# Patient Record
Sex: Female | Born: 1945 | Race: Black or African American | Hispanic: Refuse to answer | Smoking: Never smoker
Health system: Southern US, Community
[De-identification: ages and names within clinical notes are randomized; demographics above are authoritative.]

## PROBLEM LIST (undated history)

## (undated) DIAGNOSIS — E785 Hyperlipidemia, unspecified: Secondary | ICD-10-CM

## (undated) DIAGNOSIS — IMO0001 Reserved for inherently not codable concepts without codable children: Secondary | ICD-10-CM

## (undated) DIAGNOSIS — N189 Chronic kidney disease, unspecified: Secondary | ICD-10-CM

## (undated) DIAGNOSIS — E119 Type 2 diabetes mellitus without complications: Secondary | ICD-10-CM

## (undated) DIAGNOSIS — K219 Gastro-esophageal reflux disease without esophagitis: Secondary | ICD-10-CM

## (undated) DIAGNOSIS — I10 Essential (primary) hypertension: Secondary | ICD-10-CM

## (undated) DIAGNOSIS — M797 Fibromyalgia: Secondary | ICD-10-CM

## (undated) DIAGNOSIS — M1990 Unspecified osteoarthritis, unspecified site: Secondary | ICD-10-CM

## (undated) HISTORY — PX: EYE SURGERY: SHX253

## (undated) HISTORY — DX: Unspecified osteoarthritis, unspecified site: M19.90

## (undated) HISTORY — DX: Hyperlipidemia, unspecified: E78.5

## (undated) HISTORY — DX: Type 2 diabetes mellitus without complications: E11.9

## (undated) HISTORY — DX: Chronic kidney disease, unspecified: N18.9

## (undated) HISTORY — PX: REFRACTIVE SURGERY: SHX103

## (undated) HISTORY — DX: Essential (primary) hypertension: I10

## (undated) HISTORY — PX: COLONOSCOPY: SHX174

---

## 1995-01-15 HISTORY — PX: ABDOMINAL HYSTERECTOMY: SHX81

## 1998-06-25 ENCOUNTER — Emergency Department (HOSPITAL_COMMUNITY): Admission: EM | Admit: 1998-06-25 | Discharge: 1998-06-25 | Payer: Self-pay | Admitting: Emergency Medicine

## 1999-08-26 ENCOUNTER — Emergency Department (HOSPITAL_COMMUNITY): Admission: EM | Admit: 1999-08-26 | Discharge: 1999-08-26 | Payer: Self-pay | Admitting: Emergency Medicine

## 1999-08-26 ENCOUNTER — Encounter: Payer: Self-pay | Admitting: Emergency Medicine

## 1999-11-07 ENCOUNTER — Encounter: Payer: Self-pay | Admitting: Family Medicine

## 1999-11-07 ENCOUNTER — Encounter: Admission: RE | Admit: 1999-11-07 | Discharge: 1999-11-07 | Payer: Self-pay | Admitting: Family Medicine

## 2001-03-16 ENCOUNTER — Encounter: Admission: RE | Admit: 2001-03-16 | Discharge: 2001-03-16 | Payer: Self-pay | Admitting: Family Medicine

## 2001-03-16 ENCOUNTER — Encounter: Payer: Self-pay | Admitting: Family Medicine

## 2003-10-18 ENCOUNTER — Ambulatory Visit: Payer: Self-pay | Admitting: Internal Medicine

## 2006-04-01 ENCOUNTER — Ambulatory Visit: Payer: Self-pay | Admitting: Internal Medicine

## 2007-04-07 ENCOUNTER — Ambulatory Visit: Payer: Self-pay | Admitting: Internal Medicine

## 2008-01-15 HISTORY — PX: CHOLECYSTECTOMY: SHX55

## 2008-07-19 ENCOUNTER — Ambulatory Visit: Payer: Self-pay | Admitting: Internal Medicine

## 2009-02-20 ENCOUNTER — Ambulatory Visit: Payer: Self-pay | Admitting: Gastroenterology

## 2009-11-21 ENCOUNTER — Ambulatory Visit: Payer: Self-pay | Admitting: Internal Medicine

## 2010-02-07 ENCOUNTER — Ambulatory Visit: Payer: Self-pay

## 2010-09-10 ENCOUNTER — Emergency Department (HOSPITAL_COMMUNITY)
Admission: EM | Admit: 2010-09-10 | Discharge: 2010-09-10 | Disposition: A | Discharge status: Home / Self Care | Payer: Medicare Other | Attending: Emergency Medicine | Admitting: Emergency Medicine

## 2010-09-10 ENCOUNTER — Emergency Department (HOSPITAL_COMMUNITY): Payer: Medicare Other

## 2010-09-10 DIAGNOSIS — Z794 Long term (current) use of insulin: Secondary | ICD-10-CM | POA: Insufficient documentation

## 2010-09-10 DIAGNOSIS — I10 Essential (primary) hypertension: Secondary | ICD-10-CM | POA: Insufficient documentation

## 2010-09-10 DIAGNOSIS — M25519 Pain in unspecified shoulder: Secondary | ICD-10-CM | POA: Insufficient documentation

## 2010-09-10 DIAGNOSIS — E119 Type 2 diabetes mellitus without complications: Secondary | ICD-10-CM | POA: Insufficient documentation

## 2010-12-03 ENCOUNTER — Ambulatory Visit: Payer: Self-pay

## 2011-12-05 ENCOUNTER — Encounter: Payer: Self-pay | Admitting: Rheumatology

## 2011-12-15 ENCOUNTER — Encounter: Payer: Self-pay | Admitting: Rheumatology

## 2012-01-15 HISTORY — PX: CATARACT EXTRACTION: SUR2

## 2013-01-19 ENCOUNTER — Ambulatory Visit: Payer: Self-pay

## 2013-06-19 LAB — HM MAMMOGRAPHY: HM Mammogram: NORMAL

## 2013-07-30 ENCOUNTER — Ambulatory Visit: Payer: Medicare Other | Admitting: Internal Medicine

## 2013-08-02 ENCOUNTER — Ambulatory Visit (INDEPENDENT_AMBULATORY_CARE_PROVIDER_SITE_OTHER): Payer: Medicare Other | Admitting: Internal Medicine

## 2013-08-02 ENCOUNTER — Encounter: Payer: Self-pay | Admitting: Internal Medicine

## 2013-08-02 ENCOUNTER — Ambulatory Visit (INDEPENDENT_AMBULATORY_CARE_PROVIDER_SITE_OTHER)
Admission: RE | Admit: 2013-08-02 | Discharge: 2013-08-02 | Disposition: A | Discharge status: Home / Self Care | Payer: Medicare Other | Source: Ambulatory Visit | Attending: Internal Medicine | Admitting: Internal Medicine

## 2013-08-02 VITALS — BP 124/72 | HR 70 | Temp 98.3°F | Ht 65.75 in | Wt 262.0 lb

## 2013-08-02 DIAGNOSIS — Z8669 Personal history of other diseases of the nervous system and sense organs: Secondary | ICD-10-CM

## 2013-08-02 DIAGNOSIS — E1165 Type 2 diabetes mellitus with hyperglycemia: Secondary | ICD-10-CM | POA: Insufficient documentation

## 2013-08-02 DIAGNOSIS — M5440 Lumbago with sciatica, unspecified side: Secondary | ICD-10-CM

## 2013-08-02 DIAGNOSIS — M5430 Sciatica, unspecified side: Secondary | ICD-10-CM

## 2013-08-02 DIAGNOSIS — M129 Arthropathy, unspecified: Secondary | ICD-10-CM

## 2013-08-02 DIAGNOSIS — I10 Essential (primary) hypertension: Secondary | ICD-10-CM

## 2013-08-02 DIAGNOSIS — M479 Spondylosis, unspecified: Secondary | ICD-10-CM | POA: Insufficient documentation

## 2013-08-02 DIAGNOSIS — M1990 Unspecified osteoarthritis, unspecified site: Secondary | ICD-10-CM

## 2013-08-02 DIAGNOSIS — E118 Type 2 diabetes mellitus with unspecified complications: Secondary | ICD-10-CM

## 2013-08-02 DIAGNOSIS — E119 Type 2 diabetes mellitus without complications: Secondary | ICD-10-CM

## 2013-08-02 DIAGNOSIS — E785 Hyperlipidemia, unspecified: Secondary | ICD-10-CM

## 2013-08-02 LAB — COMPREHENSIVE METABOLIC PANEL
ALT: 16 U/L (ref 0–35)
AST: 21 U/L (ref 0–37)
Albumin: 3.5 g/dL (ref 3.5–5.2)
Alkaline Phosphatase: 123 U/L — ABNORMAL HIGH (ref 39–117)
BUN: 12 mg/dL (ref 6–23)
CO2: 28 mEq/L (ref 19–32)
Calcium: 9 mg/dL (ref 8.4–10.5)
Chloride: 106 mEq/L (ref 96–112)
Creatinine, Ser: 1.1 mg/dL (ref 0.4–1.2)
GFR: 65.12 mL/min (ref >60.00)
Glucose, Bld: 96 mg/dL (ref 70–99)
Potassium: 3.9 mEq/L (ref 3.5–5.1)
Sodium: 141 mEq/L (ref 135–145)
Total Bilirubin: 0.4 mg/dL (ref 0.2–1.2)
Total Protein: 7.3 g/dL (ref 6.0–8.3)

## 2013-08-02 LAB — LIPID PANEL
Cholesterol: 142 mg/dL (ref 0–200)
HDL: 43.3 mg/dL (ref >39.00)
LDL Cholesterol: 72 mg/dL (ref 0–99)
NonHDL: 98.7
Total CHOL/HDL Ratio: 3
Triglycerides: 136 mg/dL (ref 0.0–149.0)
VLDL: 27.2 mg/dL (ref 0.0–40.0)

## 2013-08-02 LAB — CBC
HCT: 35.2 % — ABNORMAL LOW (ref 36.0–46.0)
Hemoglobin: 11.4 g/dL — ABNORMAL LOW (ref 12.0–15.0)
MCHC: 32.4 g/dL (ref 30.0–36.0)
MCV: 81.1 fl (ref 78.0–100.0)
Platelets: 289 10*3/uL (ref 150.0–400.0)
RBC: 4.34 Mil/uL (ref 3.87–5.11)
RDW: 15.5 % (ref 11.5–15.5)
WBC: 5.6 10*3/uL (ref 4.0–10.5)

## 2013-08-02 LAB — MICROALBUMIN / CREATININE URINE RATIO
Creatinine,U: 88.8 mg/dL
Microalb Creat Ratio: 0.3 mg/g (ref 0.0–30.0)
Microalb, Ur: 0.3 mg/dL (ref 0.0–1.9)

## 2013-08-02 LAB — HEMOGLOBIN A1C: Hgb A1c MFr Bld: 10.4 % — ABNORMAL HIGH (ref 4.6–6.5)

## 2013-08-02 MED ORDER — LOSARTAN POTASSIUM 25 MG PO TABS: 25.0000 mg | ORAL_TABLET | Freq: Every day | ORAL | Status: DC

## 2013-08-02 NOTE — Assessment & Plan Note (Signed)
Mostly hands Controlled with etodolac

## 2013-08-02 NOTE — Assessment & Plan Note (Signed)
No issues on Lipitor Will check CMET and lipid profile today

## 2013-08-02 NOTE — Progress Notes (Signed)
Pre visit review using our clinic review tool, if applicable. No additional management support is needed unless otherwise documented below in the visit note. 

## 2013-08-02 NOTE — Assessment & Plan Note (Signed)
She is on lasix for peripheral edema She would like to be take off the lisinopril-hct because she is experiencing hair loss She has had 1 hypotensive episode Will d/c lisinopril-hct  RX provided for losartan 25 mg

## 2013-08-02 NOTE — Progress Notes (Signed)
HPI  Pt presents to the clinic today to establish care. [facility] is [facility].   HTN: Well controlled on Lasix, Lisinopril-HCT. She does report having 1 hypotensive episode in the last month. She does not recall taking any extra medication. She also feels like her hair is starting to fall out since starting on the Lisinopril-HCT.  DM2: Blood sugars range 97-220. She is currently on Amaryl. She reports that she takes the Lantus when she feels the numbness and tingling in her feet. She stopped the Victoza because she felt it was causing her sciatica to flare up.  HLD: Denies myalgias on Lipitor.  Arthritis: Takes Etodolac when needed.  Flu: never Tetanus: < than 10 years ago Pneumovax: never Zostovax: never Mammogram: 2014 Pap Smear: Hysterectomy 1997 Bone Density: 2010 Colon Screening: 2009 Eye Doctor: scheduled 08/2013 Dentist: biannually  Past Medical History  Diagnosis Date  . Arthritis   . Diabetes mellitus without complication   . Hyperlipidemia   . Hypertension     Current Outpatient Prescriptions  Medication Sig Dispense Refill  . aspirin 81 MG tablet Take 81 mg by mouth daily.      Marland Kitchen. atorvastatin (LIPITOR) 20 MG tablet Take 20 mg by mouth daily.      Marland Kitchen. etodolac (LODINE) 400 MG tablet Take 400 mg by mouth 3 (three) times daily.      . furosemide (LASIX) 20 MG tablet Take 20 mg by mouth daily as needed.      Marland Kitchen. glimepiride (AMARYL) 4 MG tablet Take 4 mg by mouth daily with breakfast.      . Insulin Glargine (LANTUS) 100 UNIT/ML Solostar Pen Inject 20 Units into the skin daily at 10 pm.      . Liraglutide (VICTOZA) 18 MG/3ML SOPN Inject 1.2 mLs into the skin.      Marland Kitchen. lisinopril-hydrochlorothiazide (PRINZIDE,ZESTORETIC) 10-12.5 MG per tablet Take 1 tablet by mouth daily.       No current facility-administered medications for this visit.    No Known Allergies  Family History  Problem Relation Age of Onset  . Arthritis Mother   . Cancer Mother    Ovarian  . Diabetes Mother   . Heart disease Father   . Hypertension Father     History   Social History  . Marital Status: Widowed    Spouse Name: N/A    Number of Children: N/A  . Years of Education: N/A   Occupational History  . Not on file.   Social History Main Topics  . Smoking status: Never Smoker   . Smokeless tobacco: Never Used  . Alcohol Use: No  . Drug Use: Not on file  . Sexual Activity: Not on file   Other Topics Concern  . Not on file   Social History Narrative  . No narrative on file    ROS:  Constitutional: Denies fever, malaise, fatigue, headache or abrupt weight changes.  Respiratory: Denies difficulty breathing, shortness of breath, cough or sputum production.   Cardiovascular: Denies chest pain, chest tightness, palpitations or swelling in the hands or feet.  Musculoskeletal: Pt reports joint pain in hands and low back pain. Denies decrease in range of motion, difficulty with gait.  Skin: Denies redness, rashes, lesions or ulcercations.  Neurological: Denies dizziness, difficulty with memory, difficulty with speech or problems with balance and coordination.   No other specific complaints in a complete review of systems (except as listed in HPI above).  PE:  BP 124/72  Pulse 70  Temp(Src) 98.3 F (36.8 C) (Oral)  Ht 5' 5.75" (1.67 m)  Wt 262 lb (118.842 kg)  BMI 42.61 kg/m2  SpO2 98% Wt Readings from Last 3 Encounters:  08/02/13 262 lb (118.842 kg)    General: Appears her stated age, obese but well developed, well nourished in NAD. Cardiovascular: Normal rate and rhythm. S1,S2 noted.  No murmur, rubs or gallops noted. No JVD or BLE edema. No carotid bruits noted. Pulmonary/Chest: Normal effort and positive vesicular breath sounds. No respiratory distress. No wheezes, rales or ronchi noted.  Musculoskeletal: Normal flexion and extension of the fingers. Decreased flexion, normal extension of the back.  Neurological: Alert and oriented.  Cranial nerves II-XII intact. Coordination normal. +DTRs bilaterally.   Assessment and Plan:  Low back pain with history of sciatica:  She has never had this worked up She would like a referral to a specialist Will start with xray of lumbar spine Will likely need MRI of lumbar spine Advised her that I do not think the Victoza would be causing her sciatica to flare Continue etodolac for now  RTC in 6 months to follow up chronic medical conditions

## 2013-08-02 NOTE — Patient Instructions (Addendum)

## 2013-08-02 NOTE — Assessment & Plan Note (Signed)
Encouraged her to work on diet and exercise 

## 2013-08-02 NOTE — Assessment & Plan Note (Signed)
She has stopped the Victoza Only taking Lantus when she feels she needs to Taking Amaryl daily Hx of renal insufficiency with Metformin Will check A1C and microalbumin today

## 2013-08-12 ENCOUNTER — Ambulatory Visit: Payer: Medicare Other | Admitting: Internal Medicine

## 2013-08-31 ENCOUNTER — Encounter: Payer: Self-pay | Admitting: Internal Medicine

## 2013-10-13 ENCOUNTER — Other Ambulatory Visit: Payer: Self-pay

## 2013-10-13 DIAGNOSIS — I10 Essential (primary) hypertension: Secondary | ICD-10-CM

## 2013-10-13 MED ORDER — LOSARTAN POTASSIUM 25 MG PO TABS: 25.0000 mg | ORAL_TABLET | Freq: Every day | ORAL | Status: DC

## 2013-10-13 MED ORDER — ATORVASTATIN CALCIUM 20 MG PO TABS: 20.0000 mg | ORAL_TABLET | Freq: Every day | ORAL | Status: DC

## 2013-10-13 NOTE — Telephone Encounter (Signed)
[facility] left /vm requesting 90 day refill on atorvastatin and losartan instead of 30 day supply. Pt has appt scheduled on 11/08/13. Refills done.

## 2013-11-02 ENCOUNTER — Ambulatory Visit: Payer: Medicare Other | Admitting: Internal Medicine

## 2013-11-03 ENCOUNTER — Other Ambulatory Visit: Payer: Self-pay

## 2013-11-03 NOTE — Telephone Encounter (Signed)
This medication has never been filled by you--please advise if okay to fill

## 2013-11-08 ENCOUNTER — Encounter: Payer: Self-pay | Admitting: Internal Medicine

## 2013-11-08 ENCOUNTER — Ambulatory Visit (INDEPENDENT_AMBULATORY_CARE_PROVIDER_SITE_OTHER): Payer: Medicare Other | Admitting: Internal Medicine

## 2013-11-08 VITALS — BP 146/94 | HR 81 | Temp 98.6°F | Wt 275.0 lb

## 2013-11-08 DIAGNOSIS — E1165 Type 2 diabetes mellitus with hyperglycemia: Secondary | ICD-10-CM

## 2013-11-08 DIAGNOSIS — I10 Essential (primary) hypertension: Secondary | ICD-10-CM

## 2013-11-08 DIAGNOSIS — E1169 Type 2 diabetes mellitus with other specified complication: Secondary | ICD-10-CM

## 2013-11-08 DIAGNOSIS — E785 Hyperlipidemia, unspecified: Secondary | ICD-10-CM

## 2013-11-08 DIAGNOSIS — M1990 Unspecified osteoarthritis, unspecified site: Secondary | ICD-10-CM

## 2013-11-08 LAB — HEMOGLOBIN A1C: Hgb A1c MFr Bld: 9.4 % — ABNORMAL HIGH (ref 4.6–6.5)

## 2013-11-08 MED ORDER — TRAMADOL HCL 50 MG PO TABS: 50.0000 mg | ORAL_TABLET | Freq: Two times a day (BID) | ORAL | Status: DC | PRN

## 2013-11-08 NOTE — Assessment & Plan Note (Signed)
Hyperglycemia Last A1C 10.4 Also noted some PVD changes History of kidney disease with Metformin-now off Will repeat A1C today Continue Amaryl and Lantus at this time- will dose adjust if needed She is scheduled for eye exam She will get flu shot at pharmacy Will call pharmacy to see if/when she received pneumonia vaccine Diet information given

## 2013-11-08 NOTE — Patient Instructions (Addendum)
Diabetes Mellitus and Food It is important for you to manage your blood sugar (glucose) level. Your blood glucose level can be greatly affected by what you eat. Eating healthier foods in the appropriate amounts throughout the day at about the same time each day will help you control your blood glucose level. It can also help slow or prevent worsening of your diabetes mellitus. Healthy eating may even help you improve the level of your blood pressure and reach or maintain a healthy weight.  HOW CAN FOOD AFFECT ME? Carbohydrates Carbohydrates affect your blood glucose level more than any other type of food. Your dietitian will help you determine how many carbohydrates to eat at each meal and teach you how to count carbohydrates. Counting carbohydrates is important to keep your blood glucose at a healthy level, especially if you are using insulin or taking certain medicines for diabetes mellitus. Alcohol Alcohol can cause sudden decreases in blood glucose (hypoglycemia), especially if you use insulin or take certain medicines for diabetes mellitus. Hypoglycemia can be a life-threatening condition. Symptoms of hypoglycemia (sleepiness, dizziness, and disorientation) are similar to symptoms of having too much alcohol.  If your health care provider has given you approval to drink alcohol, do so in moderation and use the following guidelines:  Women should not have more than one drink per day, and men should not have more than two drinks per day. One drink is equal to:  12 oz of beer.  5 oz of wine.  1 oz of hard liquor.  Do not drink on an empty stomach.  Keep yourself hydrated. Have water, diet soda, or unsweetened iced tea.  Regular soda, juice, and other mixers might contain a lot of carbohydrates and should be counted. WHAT FOODS ARE NOT RECOMMENDED? As you make food choices, it is important to remember that all foods are not the same. Some foods have fewer nutrients per serving than other  foods, even though they might have the same number of calories or carbohydrates. It is difficult to get your body what it needs when you eat foods with fewer nutrients. Examples of foods that you should avoid that are high in calories and carbohydrates but low in nutrients include:  Trans fats (most processed foods list trans fats on the Nutrition Facts label).  Regular soda.  Juice.  Candy.  Sweets, such as cake, pie, doughnuts, and cookies.  Fried foods. WHAT FOODS CAN I EAT? Have nutrient-rich foods, which will nourish your body and keep you healthy. The food you should eat also will depend on several factors, including:  The calories you need.  The medicines you take.  Your weight.  Your blood glucose level.  Your blood pressure level.  Your cholesterol level. You also should eat a variety of foods, including:  Protein, such as meat, poultry, fish, tofu, nuts, and seeds (lean animal proteins are best).  Fruits.  Vegetables.  Dairy products, such as milk, cheese, and yogurt (low fat is best).  Breads, grains, pasta, cereal, rice, and beans.  Fats such as olive oil, trans fat-free margarine, canola oil, avocado, and olives. DOES EVERYONE WITH DIABETES MELLITUS HAVE THE SAME MEAL PLAN? Because every person with diabetes mellitus is different, there is not one meal plan that works for everyone. It is very important that you meet with a dietitian who will help you create a meal plan that is just right for you. Document Released: 09/27/2004 Document Revised: 01/05/2013 Document Reviewed: 11/27/2012 Exit[employer] Patient Information 2015 [employer]. This   information is not intended to replace advice given to you by your health care provider. Make sure you discuss any questions you have with your health care provider.  

## 2013-11-08 NOTE — Progress Notes (Signed)
Subjective:    Patient ID: Bethany [name], female    DOB: Sep 20, 1945, 68 y.o.   MRN: [id number]  HPI  Pt presents to the clinic today for 3 month follow up.  DM2 with kidney dysfunction and hyperglycemia: Blood sugars range from 40's to 280 as the highest. Still taking the amyrl and lantus daily. Had to be taken off Metformin due to kidney dysfunction. She has gained 13 lbs over the last 3 months. She has not been able to exercise because of worsening back pain. Gets flu shot yearly, waiting for her pharmacy to get the high dose. Unsure of last pneumovax shot but reports if she did get one, it was at her pharmacy. Past due for vision exam, she has one scheduled 11/2013.  HLD: Likely R/T DM2. On Lipitor 20 mg daily. Denies myalgias. Last lipid profile reviewed. LDL 72.  HTN: Likely R/T obesity. Borderline on Cozaar and Lasix. She reports that she has not taken her medications this morning.  Arthritis: Mostly in her back. Causing flares of sciatica at times. Worse recently. Etodolac not helping. Was trying to do water aerobics for exercise but unable to due to back pain. Seems to be worse with standing.  Review of Systems      Past Medical History  Diagnosis Date  . Arthritis   . Diabetes mellitus without complication   . Hyperlipidemia   . Hypertension     Current Outpatient Prescriptions  Medication Sig Dispense Refill  . aspirin 81 MG tablet Take 81 mg by mouth daily.      Marland Kitchen. atorvastatin (LIPITOR) 20 MG tablet Take 1 tablet (20 mg total) by mouth daily.  90 tablet  0  . furosemide (LASIX) 20 MG tablet Take 20 mg by mouth daily as needed.      Marland Kitchen. glimepiride (AMARYL) 4 MG tablet Take 4 mg by mouth daily with breakfast.      . Insulin Glargine (LANTUS) 100 UNIT/ML Solostar Pen Inject 20 Units into the skin daily at 10 pm.      . losartan (COZAAR) 25 MG tablet Take 1 tablet (25 mg total) by mouth daily.  90 tablet  0  . traMADol (ULTRAM) 50 MG tablet Take 1 tablet (50 mg total) by  mouth every 12 (twelve) hours as needed.  60 tablet  0   No current facility-administered medications for this visit.    No Known Allergies  Family History  Problem Relation Age of Onset  . Arthritis Mother   . Cancer Mother     Ovarian  . Diabetes Mother   . Heart disease Father   . Hypertension Father     History   Social History  . Marital Status: Widowed    Spouse Name: N/A    Number of Children: N/A  . Years of Education: N/A   Occupational History  . Not on file.   Social History Main Topics  . Smoking status: Never Smoker   . Smokeless tobacco: Never Used  . Alcohol Use: No  . Drug Use: No  . Sexual Activity: Not Currently   Other Topics Concern  . Not on file   Social History Narrative  . No narrative on file     Constitutional: Pt reports weight gain. Denies fever, malaise, fatigue, headache.  Respiratory: Denies difficulty breathing, shortness of breath, cough or sputum production.   Cardiovascular: Denies chest pain, chest tightness, palpitations or swelling in the hands or feet.  Musculoskeletal: Pt reports back pain. Denies  muscle pain or joint pain and swelling.  Skin: Denies redness, rashes, lesions or ulcercations.  Neurological: Denies dizziness, difficulty with memory, difficulty with speech or problems with balance and coordination.   No other specific complaints in a complete review of systems (except as listed in HPI above).  Objective:   Physical Exam   BP 146/94  Pulse 81  Temp(Src) 98.6 F (37 C) (Oral)  Wt 275 lb (124.739 kg)  SpO2 98% Wt Readings from Last 3 Encounters:  11/08/13 275 lb (124.739 kg)  08/02/13 262 lb (118.842 kg)    General: Appears her stated age, obese but well developed, well nourished in NAD. Skin: Warm, dry and intact. No rashes, lesions or ulcerations noted. Cardiovascular: Normal rate and rhythm. S1,S2 noted.  No murmur, rubs or gallops noted. No JVD or BLE edema. No carotid bruits  noted. Pulmonary/Chest: Normal effort and positive vesicular breath sounds. No respiratory distress. No wheezes, rales or ronchi noted.  Musculoskeletal: Decreased flexion, extension and rotation of the lumbar spine. Pain with palpation of the lumbar spine. Strength 5/5 BLE. Ataxic gait.  Neurological: Alert and oriented.   BMET    Component Value Date/Time   NA 141 08/02/2013 0954   K 3.9 08/02/2013 0954   CL 106 08/02/2013 0954   CO2 28 08/02/2013 0954   GLUCOSE 96 08/02/2013 0954   BUN 12 08/02/2013 0954   CREATININE 1.1 08/02/2013 0954   CALCIUM 9.0 08/02/2013 0954    Lipid Panel     Component Value Date/Time   CHOL 142 08/02/2013 0954   TRIG 136.0 08/02/2013 0954   HDL 43.30 08/02/2013 0954   CHOLHDL 3 08/02/2013 0954   VLDL 27.2 08/02/2013 0954   LDLCALC 72 08/02/2013 0954    CBC    Component Value Date/Time   WBC 5.6 08/02/2013 0954   RBC 4.34 08/02/2013 0954   HGB 11.4* 08/02/2013 0954   HCT 35.2* 08/02/2013 0954   PLT 289.0 08/02/2013 0954   MCV 81.1 08/02/2013 0954   MCHC 32.4 08/02/2013 0954   RDW 15.5 08/02/2013 0954    Hgb A1C Lab Results  Component Value Date   HGBA1C 10.4* 08/02/2013        Assessment & Plan:

## 2013-11-08 NOTE — Assessment & Plan Note (Signed)
Has gained 13 pounds She does not adhere to any diet plan Does not exercise due to back pain Discussed how obesity is contributing to chronic conditions- DM2, HTN, and arthritis

## 2013-11-08 NOTE — Assessment & Plan Note (Signed)
Likely R/T diabetes Well controlled on Lipitor

## 2013-11-08 NOTE — Assessment & Plan Note (Signed)
Borderline elevated today but has not taken her medication Will continue Cozaar and Lasix at this time

## 2013-11-08 NOTE — Assessment & Plan Note (Addendum)
Worse, likely due to increased obesity Lumbar films from 09/2013 reviewed Will stop Etodolac and change to Tramadol If no improvement, may need referral to orthopedics for injection Encouraged her to try the water aerobics as weight loss would be beneficial

## 2013-11-09 ENCOUNTER — Telehealth: Payer: Self-pay | Admitting: Internal Medicine

## 2013-11-09 NOTE — Telephone Encounter (Signed)
emmi mailed  °

## 2013-11-10 NOTE — Addendum Note (Signed)
Addended by: Roena MaladyEVONTENNO, Ermalinda Joubert Y on: 11/10/2013 11:31 AM   Modules accepted: Orders

## 2013-12-07 ENCOUNTER — Other Ambulatory Visit: Payer: Self-pay | Admitting: Family Medicine

## 2013-12-07 NOTE — Telephone Encounter (Signed)
Ok to phone in tramadol 

## 2013-12-07 NOTE — Telephone Encounter (Signed)
Pt requesting medication refill. Last f/u appt 10/2013. pls advise 

## 2013-12-07 NOTE — Telephone Encounter (Signed)
Rx called in to pharmacy. 

## 2013-12-08 ENCOUNTER — Other Ambulatory Visit: Payer: Self-pay | Admitting: *Deleted

## 2013-12-08 MED ORDER — GLIMEPIRIDE 4 MG PO TABS: 4.0000 mg | ORAL_TABLET | Freq: Every day | ORAL | Status: DC

## 2014-01-27 ENCOUNTER — Other Ambulatory Visit (INDEPENDENT_AMBULATORY_CARE_PROVIDER_SITE_OTHER): Payer: Medicare Other

## 2014-01-27 DIAGNOSIS — E1169 Type 2 diabetes mellitus with other specified complication: Secondary | ICD-10-CM

## 2014-01-27 DIAGNOSIS — L738 Other specified follicular disorders: Secondary | ICD-10-CM | POA: Diagnosis not present

## 2014-01-27 LAB — HEMOGLOBIN A1C: Hgb A1c MFr Bld: 9.4 % — ABNORMAL HIGH (ref 4.6–6.5)

## 2014-01-28 ENCOUNTER — Other Ambulatory Visit: Payer: Self-pay

## 2014-01-28 MED ORDER — TRAMADOL HCL 50 MG PO TABS: ORAL_TABLET | ORAL | Status: DC

## 2014-01-28 NOTE — Telephone Encounter (Signed)
Note left requesting refill tramadol to [facility].Please advise.

## 2014-01-28 NOTE — Telephone Encounter (Signed)
OK to phone in tramadol 

## 2014-01-28 NOTE — Telephone Encounter (Signed)
Rx called in to pharmacy. 

## 2014-02-03 ENCOUNTER — Ambulatory Visit (INDEPENDENT_AMBULATORY_CARE_PROVIDER_SITE_OTHER): Payer: Medicare Other | Admitting: Internal Medicine

## 2014-02-03 ENCOUNTER — Encounter: Payer: Self-pay | Admitting: Internal Medicine

## 2014-02-03 VITALS — BP 134/86 | HR 74 | Temp 98.8°F | Wt 274.0 lb

## 2014-02-03 DIAGNOSIS — M545 Low back pain, unspecified: Secondary | ICD-10-CM

## 2014-02-03 DIAGNOSIS — Z23 Encounter for immunization: Secondary | ICD-10-CM | POA: Diagnosis not present

## 2014-02-03 DIAGNOSIS — E1165 Type 2 diabetes mellitus with hyperglycemia: Secondary | ICD-10-CM

## 2014-02-03 DIAGNOSIS — E785 Hyperlipidemia, unspecified: Secondary | ICD-10-CM | POA: Diagnosis not present

## 2014-02-03 DIAGNOSIS — I10 Essential (primary) hypertension: Secondary | ICD-10-CM

## 2014-02-03 DIAGNOSIS — E669 Obesity, unspecified: Secondary | ICD-10-CM | POA: Diagnosis not present

## 2014-02-03 NOTE — Patient Instructions (Signed)

## 2014-02-03 NOTE — Progress Notes (Signed)
Pre visit review using our clinic review tool, if applicable. No additional management support is needed unless otherwise documented below in the visit note. 

## 2014-02-03 NOTE — Progress Notes (Signed)
HPI  Bethany [name] is a 69 y.o. female presenting to the clinic today for follow-up of chronic medical conditions DM Type 2, essential hypertension, and hyperlipidemia.   DM2: Her A1c is 9.4 which is no change from 10/15, despite increase of 10 unit increase of Lantus. She reports taking medicine as directed, time and administration. She attributes no improvement to her diet or exercise. She reports chronic low back pain that prevents her from cooking meals or starting an exercise routine. She has worked with diabetic dieticians in the past but struggles to follow any plan. Sometimes she doesn't eat at night due to exhaustion and rarely eats vegetables or balanced meals. She reports highest glucose readings in the evening. Pt. reports sweating and shaking in the mornings due to low blood sugar. Past due for vision exam, did not make it to last scheduled appointment but reports she has made a new one for 2/15. Would like to have the flu shot today. Pneumonia shot not UTD.  HLD: Likely R/T DM2. On atorvastatin 20 mg daily. Denies myalgias. Last lipid profile reviewed -  LDL 72 mg/dL  HTN: Likely R/T obesity. Controlled on Cozaar and Lasix. Stable reading today at 134/86.   Arthritis: Mostly in back. Worse w/ standing. Causing flares of sciatica under thighs. No improvement w/ Etodolac. Request referral to Ortho today.  Review of Systems      Past Medical History  Diagnosis Date  . Arthritis   . Diabetes mellitus without complication   . Hyperlipidemia   . Hypertension     Family History  Problem Relation Age of Onset  . Arthritis Mother   . Cancer Mother     Ovarian  . Diabetes Mother   . Heart disease Father   . Hypertension Father     History   Social History  . Marital Status: Widowed    Spouse Name: N/A    Number of Children: N/A  . Years of Education: N/A   Occupational History  . Not on file.   Social History Main Topics  . Smoking status: Never Smoker   . Smokeless  tobacco: Never Used  . Alcohol Use: No  . Drug Use: No  . Sexual Activity: Not Currently   Other Topics Concern  . Not on file   Social History Narrative    No Known Allergies   Constitutional: Positive fatigue. Denies headache, fever, mental status changes.  Respiratory: Denies difficulty breathing, shortness of breath, or cough.  Cardiovascular: Denies chest pain, chest tightness, palpitations or swelling in the hands or feet.  Neuro: Denies visual disturbances, dizziness, numbness, tingling, loss of sensation, difficulty w/ memory, speech or problems w/ balance and coordination.  No other specific complaints in a complete review of systems (except as listed in HPI above).  Objective:   BP 134/86 mmHg  Pulse 74  Temp(Src) 98.8 F (37.1 C) (Oral)  Wt 274 lb (124.286 kg)  SpO2 99% Wt Readings from Last 3 Encounters:  02/03/14 274 lb (124.286 kg)  11/08/13 275 lb (124.739 kg)  08/02/13 262 lb (118.842 kg)   General: Pleasant, appears her stated age, obese but well developed, in NAD. Skin: Warm, dry and intact. No rashes, lesions or ulcerations noted.  Cardiovascular: Normal rate and rhythm. S1,S2 noted.  No murmur, rubs or gallops noted. No JVD or BLE edema. No carotid bruits noted. Pulmonary/Chest: Normal effort and positive vesicular breath sounds. No respiratory distress. No wheezes, rales or ronchi noted.  MSK: Decreased flexion and extension of  the back. Normal rotation. Pain with palpation of the lumbar spine. Strength 5/5 BLE. Neuro: Alert and oriented. No upper/lower extremity weakness or numbness. Cranial nerves 2-12 intact.   Assessment & Plan:   Midline low back pain w/out sciatica:  Orthopedic referral. Refill of tramadol 50 mg  DM Type 2, uncontrolled, w/ complications:   Continue on glimepiride and insulin Glargine; no insulin increase despite A1c due to AM hypoglycemia.  Work on meal planning/vegetables and tolerable exercise. Needs A1c at 6687-month  follow-up. Flu shot today Prevnar at next visit  Essential hypertension:  Stable; continue on losarten and lasix  Hyperlipidemia:  Stable; continue on atorvastatin 20 mg Will check lipid profile at next visit  Obesity:  Working on balanced diet/less calories and water aerobic exercises as tolerable.   RTC as needed or if symptoms persist.

## 2014-02-08 ENCOUNTER — Ambulatory Visit: Payer: Medicare Other | Admitting: Internal Medicine

## 2014-02-09 ENCOUNTER — Other Ambulatory Visit: Payer: Self-pay | Admitting: Sports Medicine

## 2014-02-09 DIAGNOSIS — M545 Low back pain: Secondary | ICD-10-CM

## 2014-02-17 NOTE — Addendum Note (Signed)
Addended by: Roena MaladyEVONTENNO, Omri Bertran Y on: 02/17/2014 04:58 PM   Modules accepted: Orders

## 2014-02-20 ENCOUNTER — Ambulatory Visit
Admission: RE | Admit: 2014-02-20 | Discharge: 2014-02-20 | Disposition: A | Discharge status: Home / Self Care | Payer: Medicare Other | Source: Ambulatory Visit | Attending: Sports Medicine | Admitting: Sports Medicine

## 2014-02-20 DIAGNOSIS — M47817 Spondylosis without myelopathy or radiculopathy, lumbosacral region: Secondary | ICD-10-CM | POA: Diagnosis not present

## 2014-02-20 DIAGNOSIS — M4806 Spinal stenosis, lumbar region: Secondary | ICD-10-CM | POA: Diagnosis not present

## 2014-02-20 DIAGNOSIS — M545 Low back pain: Secondary | ICD-10-CM

## 2014-02-20 DIAGNOSIS — M5137 Other intervertebral disc degeneration, lumbosacral region: Secondary | ICD-10-CM | POA: Diagnosis not present

## 2014-03-04 DIAGNOSIS — M545 Low back pain: Secondary | ICD-10-CM | POA: Diagnosis not present

## 2014-03-07 ENCOUNTER — Ambulatory Visit (INDEPENDENT_AMBULATORY_CARE_PROVIDER_SITE_OTHER): Payer: Medicare Other | Admitting: Internal Medicine

## 2014-03-07 ENCOUNTER — Encounter: Payer: Self-pay | Admitting: Internal Medicine

## 2014-03-07 VITALS — BP 166/94 | HR 71 | Temp 98.4°F | Wt 275.5 lb

## 2014-03-07 DIAGNOSIS — I10 Essential (primary) hypertension: Secondary | ICD-10-CM | POA: Diagnosis not present

## 2014-03-07 MED ORDER — INSULIN GLARGINE 100 UNIT/ML SOLOSTAR PEN: 30.0000 [IU] | PEN_INJECTOR | Freq: Every day | SUBCUTANEOUS | Status: DC

## 2014-03-07 MED ORDER — LOSARTAN POTASSIUM 50 MG PO TABS: 50.0000 mg | ORAL_TABLET | Freq: Every day | ORAL | Status: DC

## 2014-03-07 MED ORDER — ATORVASTATIN CALCIUM 20 MG PO TABS: 20.0000 mg | ORAL_TABLET | Freq: Every day | ORAL | Status: DC

## 2014-03-07 MED ORDER — GLIMEPIRIDE 4 MG PO TABS: 4.0000 mg | ORAL_TABLET | Freq: Every day | ORAL | Status: DC

## 2014-03-07 NOTE — Progress Notes (Signed)
Subjective:    Patient ID: Bethany [name], female    DOB: 1945/04/25, 69 y.o.   MRN: [id number]  HPI  Pt presents to the clinic today to recheck BP. She reports she had a epidural steroid injection on Friday. Prior to the procedure her BP was 200/100. She has had headaches but denies dizziness, chest pain, shortness of breath or blurred vision.  Her BP here has never really been > 140/90. Her BP today is 166/94. She is taking Losartan as prescribed. Lasix is only prn for swelling. She does check her BP at home and reports it was 168/86 today.  Review of Systems      Past Medical History  Diagnosis Date  . Arthritis   . Diabetes mellitus without complication   . Hyperlipidemia   . Hypertension     Current Outpatient Prescriptions  Medication Sig Dispense Refill  . aspirin 81 MG tablet Take 81 mg by mouth daily.    Marland Kitchen. atorvastatin (LIPITOR) 20 MG tablet Take 1 tablet (20 mg total) by mouth daily. 90 tablet 0  . furosemide (LASIX) 20 MG tablet Take 20 mg by mouth daily as needed.    Marland Kitchen. glimepiride (AMARYL) 4 MG tablet Take 1 tablet (4 mg total) by mouth daily with breakfast. 90 tablet 0  . Insulin Glargine (LANTUS) 100 UNIT/ML Solostar Pen Inject 20 Units into the skin daily at 10 pm.    . losartan (COZAAR) 25 MG tablet Take 1 tablet (25 mg total) by mouth daily. 90 tablet 0  . traMADol (ULTRAM) 50 MG tablet TAKE ONE (1) TABLET BY MOUTH EVERY 12 HOURS AS NEEDED 60 tablet 0   No current facility-administered medications for this visit.    No Known Allergies  Family History  Problem Relation Age of Onset  . Arthritis Mother   . Cancer Mother     Ovarian  . Diabetes Mother   . Heart disease Father   . Hypertension Father     History   Social History  . Marital Status: Widowed    Spouse Name: N/A  . Number of Children: N/A  . Years of Education: N/A   Occupational History  . Not on file.   Social History Main Topics  . Smoking status: Never Smoker   . Smokeless  tobacco: Never Used  . Alcohol Use: No  . Drug Use: No  . Sexual Activity: Not Currently   Other Topics Concern  . Not on file   Social History Narrative     Constitutional: Denies fever, malaise, fatigue,  or abrupt weight changes.  HEENT: Denies eye pain, eye redness, ear pain, ringing in the ears, wax buildup, runny nose, nasal congestion, bloody nose, or sore throat. Respiratory: Denies difficulty breathing, shortness of breath, cough or sputum production.   Cardiovascular: Denies chest pain, chest tightness, palpitations or swelling in the hands or feet.  Neurological: Denies dizziness, difficulty with memory, difficulty with speech or problems with balance and coordination.   No other specific complaints in a complete review of systems (except as listed in HPI above).   Objective:   Physical Exam  BP 166/94 mmHg  Pulse 71  Temp(Src) 98.4 F (36.9 C) (Oral)  Wt 275 lb 8 oz (124.966 kg)  SpO2 99%  Wt Readings from Last 3 Encounters:  03/07/14 275 lb 8 oz (124.966 kg)  02/03/14 274 lb (124.286 kg)  11/08/13 275 lb (124.739 kg)    General: Appears her stated age, obese but well developed, well  nourished in NAD. HEENT: Head: normal shape and size; Eyes: sclera white, no icterus, conjunctiva pink, PERRLA and EOMs intact;  Cardiovascular: Normal rate and rhythm. S1,S2 noted.  No murmur, rubs or gallops noted.  Pulmonary/Chest: Normal effort and positive vesicular breath sounds. No respiratory distress. No wheezes, rales or ronchi noted.  Neurological: Alert and oriented. Cranial nerves II-XII intact. Coordination normal. +DTRs bilaterally.   BMET    Component Value Date/Time   NA 141 08/02/2013 0954   K 3.9 08/02/2013 0954   CL 106 08/02/2013 0954   CO2 28 08/02/2013 0954   GLUCOSE 96 08/02/2013 0954   BUN 12 08/02/2013 0954   CREATININE 1.1 08/02/2013 0954   CALCIUM 9.0 08/02/2013 0954    Lipid Panel     Component Value Date/Time   CHOL 142 08/02/2013 0954     TRIG 136.0 08/02/2013 0954   HDL 43.30 08/02/2013 0954   CHOLHDL 3 08/02/2013 0954   VLDL 27.2 08/02/2013 0954   LDLCALC 72 08/02/2013 0954    CBC    Component Value Date/Time   WBC 5.6 08/02/2013 0954   RBC 4.34 08/02/2013 0954   HGB 11.4* 08/02/2013 0954   HCT 35.2* 08/02/2013 0954   PLT 289.0 08/02/2013 0954   MCV 81.1 08/02/2013 0954   MCHC 32.4 08/02/2013 0954   RDW 15.5 08/02/2013 0954    Hgb A1C Lab Results  Component Value Date   HGBA1C 9.4* 01/27/2014        Assessment & Plan:

## 2014-03-07 NOTE — Addendum Note (Signed)
Addended by: Suzzette RighterSMITH, Jmichael Gille E on: 03/07/2014 02:52 PM   Modules accepted: Kipp BroodSmartSet

## 2014-03-07 NOTE — Progress Notes (Addendum)
   Subjective:    Patient ID: Bethany [name], female    DOB: 01-17-1945, 69 y.o.   MRN: [id number]  HPI Mr. Bethany [name] is a 69 year old female who presents today wanting to follow up on her blood pressure.  She went for a epidural steroid injection for her back on Friday, and there her blood pressure was 200/100, but when taken again 30 minutes later her systolic blood pressure was 190.  When she left the office, her systolic blood pressure was in the 150's.  She does take her blood pressures at home and last week her blood pressure was 160's/80's.  She does report that she is in pain from her back, but that the pain is not new.       Review of Systems  Constitutional: Negative for fever, chills and fatigue.  HENT: Negative for congestion, postnasal drip, rhinorrhea and sore throat.   Respiratory: Negative for cough and shortness of breath.   Cardiovascular: Negative for chest pain, palpitations and leg swelling.  Skin: Negative for color change, pallor, rash and wound.  Neurological: Positive for headaches. Negative for weakness and light-headedness.   Past Medical History  Diagnosis Date  . Arthritis   . Diabetes mellitus without complication   . Hyperlipidemia   . Hypertension    Family History  Problem Relation Age of Onset  . Arthritis Mother   . Cancer Mother     Ovarian  . Diabetes Mother   . Heart disease Father   . Hypertension Father    Current Outpatient Prescriptions on File Prior to Visit  Medication Sig Dispense Refill  . aspirin 81 MG tablet Take 81 mg by mouth daily.    . furosemide (LASIX) 20 MG tablet Take 20 mg by mouth daily as needed.     No current facility-administered medications on file prior to visit.        Objective:   Physical Exam  Constitutional: She is oriented to person, place, and time. She appears well-developed and well-nourished.  HENT:  Head: Normocephalic and atraumatic.  Right Ear: External ear normal.  Left Ear: External ear normal.   Neck: Normal range of motion. Neck supple.  Cardiovascular: Normal rate, regular rhythm and normal heart sounds.   No murmur heard. Pulmonary/Chest: Effort normal.  Musculoskeletal: Normal range of motion.  Lymphadenopathy:    She has no cervical adenopathy.  Neurological: She is alert and oriented to person, place, and time.  Skin: Skin is warm and dry.   BP 166/94 mmHg  Pulse 71  Temp(Src) 98.4 F (36.9 C) (Oral)  Wt 275 lb 8 oz (124.966 kg)  SpO2 99%        Assessment & Plan:  1. Hypertension - Will increase losartan to 50mg  daily.  Follow up in one month.

## 2014-03-07 NOTE — Progress Notes (Signed)
Pre visit review using our clinic review tool, if applicable. No additional management support is needed unless otherwise documented below in the visit note. 

## 2014-03-07 NOTE — Patient Instructions (Signed)

## 2014-03-07 NOTE — Assessment & Plan Note (Signed)
Elevated Will increase Losartan 50 mg daily Continue Lasix prn Monitor BP at home, call me if  >140/90  RTC in 1 month to recheck BP

## 2014-03-15 ENCOUNTER — Other Ambulatory Visit: Payer: Self-pay

## 2014-03-15 MED ORDER — ATORVASTATIN CALCIUM 20 MG PO TABS: 20.0000 mg | ORAL_TABLET | Freq: Every day | ORAL | Status: DC

## 2014-03-15 MED ORDER — INSULIN GLARGINE 100 UNIT/ML SOLOSTAR PEN: 30.0000 [IU] | PEN_INJECTOR | Freq: Every day | SUBCUTANEOUS | Status: DC

## 2014-03-15 MED ORDER — GLIMEPIRIDE 4 MG PO TABS: 4.0000 mg | ORAL_TABLET | Freq: Every day | ORAL | Status: DC

## 2014-03-15 MED ORDER — LOSARTAN POTASSIUM 50 MG PO TABS: 50.0000 mg | ORAL_TABLET | Freq: Every day | ORAL | Status: DC

## 2014-03-15 NOTE — Telephone Encounter (Signed)
Pt cannot afford to get med at local pharmacy but request 90 day refills of atorvastatin,glimepiride, lantus and losartan [facility]. Pt did not want me to cancel [facility] due to may need med prior to mail order delivery. Pt said she has acct set up with [facility] already. Advised pt done and pt will keep 05/05/2014 appt.

## 2014-03-21 DIAGNOSIS — M545 Low back pain: Secondary | ICD-10-CM | POA: Diagnosis not present

## 2014-03-25 ENCOUNTER — Emergency Department: Payer: Self-pay | Admitting: Emergency Medicine

## 2014-03-25 DIAGNOSIS — I10 Essential (primary) hypertension: Secondary | ICD-10-CM | POA: Diagnosis not present

## 2014-03-25 DIAGNOSIS — E1165 Type 2 diabetes mellitus with hyperglycemia: Secondary | ICD-10-CM | POA: Diagnosis not present

## 2014-03-25 DIAGNOSIS — M549 Dorsalgia, unspecified: Secondary | ICD-10-CM | POA: Diagnosis not present

## 2014-03-25 DIAGNOSIS — M545 Low back pain: Secondary | ICD-10-CM | POA: Diagnosis not present

## 2014-03-25 DIAGNOSIS — R51 Headache: Secondary | ICD-10-CM | POA: Diagnosis not present

## 2014-03-28 ENCOUNTER — Telehealth: Payer: Self-pay

## 2014-03-28 NOTE — Telephone Encounter (Signed)
Agree-- ER.

## 2014-03-28 NOTE — Telephone Encounter (Signed)
PLEASE NOTE: All timestamps contained within this report are represented as [place] Standard Time. CONFIDENTIALTY NOTICE: This fax transmission is intended only for the addressee. It contains information that is legally privileged, confidential or otherwise protected from use or disclosure. If you are not the intended recipient, you are strictly prohibited from reviewing, disclosing, copying using or disseminating any of this information or taking any action in reliance on or regarding this information. If you have received this fax in error, please notify us immediately by telephone so that we can arrange for its return to us. Phone: [phone], Toll-Free: [phone], Fax: [phone] Page: 1 of 2 Call Id: [id number] [facility] - Client TELEPHONE ADVICE RECORD [facility] Patient Name: Bethany AloeMARY PULLAIM Gender: Female DOB: June 16, 1945 Age: [age] Y 24 D Return Phone Number: [phone] (Primary) Address: [place]Zip: [place] [id number] Client [facility] - Client Client Site [facility] Night Physician [place], [place] Contact Type Call Call Type Triage / Clinical Relationship To Patient Self Return Phone Number [phone] (Primary) Chief Complaint BLOOD SUGAR HIGH - 500 or greater Initial Comment Caller states 600 BS PreDisposition Did not know what to do Nurse Assessment Nurse: Roma KayserForsythe, RN, Santina Evansatherine Date/Time (Eastern Time): 03/25/2014 10:34:55 PM Confirm and document reason for call. If symptomatic, describe symptoms. ---caller states she takes glucophage and lantus 30 units at night for her insulin. today she had a steroid injection in her back and the dr warned her it may make her sugars run high. so she has been checking blood sugar frequently got up to 600 before her nightly lantus now 525 Has the patient traveled out of the country within the last 30 days? ---Not  Applicable Does the patient require triage? ---Yes Related visit to physician within the last 2 weeks? ---Yes Does the PT have any chronic conditions? (i.e. diabetes, asthma, etc.) ---Yes List chronic conditions. ---HTN, DM, high chol Guidelines Guideline Title Affirmed Question Affirmed Notes Nurse Date/Time (Eastern Time) Diabetes - High Blood Sugar Blood glucose > 500 mg/ dl (95.627.5 mmol/l) Roma KayserForsythe, RN, Santina EvansCatherine 03/25/2014 10:36:26 PM Disp. Time Lamount Cohen(Eastern Time) Disposition Final User 03/25/2014 10:29:12 PM Send to Urgent Hoy RegisterQueue Trammell, Lorie 03/25/2014 10:38:32 PM Go to ED Now (or PCP triage) Yes Roma KayserForsythe, RN, Archie Endoatherine Caller Understands: Yes Disagree/Comply: Comply PLEASE NOTE: All timestamps contained within this report are represented as [place] Standard Time. CONFIDENTIALTY NOTICE: This fax transmission is intended only for the addressee. It contains information that is legally privileged, confidential or otherwise protected from use or disclosure. If you are not the intended recipient, you are strictly prohibited from reviewing, disclosing, copying using or disseminating any of this information or taking any action in reliance on or regarding this information. If you have received this fax in error, please notify us immediately by telephone so that we can arrange for its return to us. Phone: [phone], Toll-Free: [phone], Fax: [phone] Page: 2 of 2 Call Id: [id number] Care Advice Given Per Guideline GO TO ED NOW (OR PCP TRIAGE): DRIVING: Another adult should drive. * IF NO PCP TRIAGE: You need to be seen. [facility] within the next hour. Leave as soon as you can. After Care Instructions Given Call Event Type User Date / Time Description Referrals [facility]

## 2014-04-06 ENCOUNTER — Other Ambulatory Visit: Payer: Self-pay

## 2014-04-06 NOTE — Patient Instructions (Signed)
1) Review educational material in orange folder: EMMI -Diabetes controlling blood sugar -Type 2 Diabetes food and exercise -Type 2 diabetes diet -What is low back pain  Diabetes - Living well with diabetes  2) [facility]  3) Join water aerobic class at the [employer]  4) Monitor CBG (Capillary blood glucose) 2x/day and log in [facility] Calendar  5) Monitor BP daily and log in [facility] Calendar  6) Attend follow-up appointment with Nicki Reaperegina Baity on 05/05/2014  Okey Dupre**Rose, [facility] Community Care Manager , will call you to schedule a home visit in April 2016  Contact Numbers: Jodi MourningRose Pierzchala RN- [phone] 24 Nurse Line-            [phone] [facility] Main Office-        [phone]

## 2014-04-06 NOTE — Patient Outreach (Addendum)
04/06/2014  Bethany [name] Aug 05, 1945 [id number]  Bethany MansMary R Check is an 69 y.o. female  Subjective:  Client reports she is feeling well not that she has been able to increase her physical activity. Went on a trip to [place] with her sister-in-law for the past 2 days. Reports ability to walk down long hallways without experiencing debilitating low back or leg pain. Reports she had no leg swelling from prolonged sitting or extended walking. "I am a happy go lucky person and am satisfied with my life." Reports she wants to further increase her physical activity level. She enjoys working outdoors and mows her own lawn, which is quite large. Client states she plans to enroll in a water aerobics class at the [employer] and in the future will add walking to her exercise regimen.   Objective: Review of Systems  Musculoskeletal: Positive for back pain.       Low back pain greatly improved since epidural steroid injection 03/25/14. Right lower extremity radiculopathy resolved. Pain rated a 2/10, with client stating feeling is not "pain" it is now a "soreness."  Neurological: Positive for headaches.       Experienced increase in headache frequency after receiving scalp injections for hair growth. Aleve and stopping hair growth treatment has been effective for headache management.  Psychiatric/Behavioral: Negative for depression. The patient is not nervous/anxious.     Physical Exam  Constitutional: She is oriented to person, place, and time. She appears well-developed and well-nourished.  Cardiovascular: Normal rate and regular rhythm.   Respiratory: Effort normal and breath sounds normal.  GI: Soft. Bowel sounds are normal.  Musculoskeletal: She exhibits tenderness. She exhibits no edema.  Mild tenderness in lower back right> left. Limited flexion and extension.  Neurological: She is alert and oriented to person, place, and time.  Skin: Skin is warm and dry.  Psychiatric: She has a  normal mood and affect. Her behavior is normal. Thought content normal.    Current Medications: Current Outpatient Prescriptions  Medication Sig Dispense Refill  . aspirin 81 MG tablet Take 81 mg by mouth daily.    Marland Kitchen. atorvastatin (LIPITOR) 20 MG tablet Take 1 tablet (20 mg total) by mouth daily. 90 tablet 0  . furosemide (LASIX) 20 MG tablet Take 20 mg by mouth daily as needed.    Marland Kitchen. glimepiride (AMARYL) 4 MG tablet Take 1 tablet (4 mg total) by mouth daily with breakfast. 90 tablet 0  . Insulin Glargine (LANTUS) 100 UNIT/ML Solostar Pen Inject 30 Units into the skin daily at 10 pm. 30 mL 0  . losartan (COZAAR) 50 MG tablet Take 1 tablet (50 mg total) by mouth daily. 90 tablet 0   No current facility-administered medications for this visit.    Functional Status: In your present state of health, do you have any difficulty performing the following activities: 04/06/2014  Is the patient deaf or have difficulty hearing? N  Hearing N  Vision N  Difficulty concentrating or making decisions Y  Walking or climbing stairs? N  Doing errands, shopping? N  Preparing Food and eating ? N  Using the Toilet? N  In the past six months, have you accidently leaked urine? N  Do you have problems with loss of bowel control? N  Managing your Medications? N  Managing your Finances? N  Housekeeping or managing your Housekeeping? N    Fall/Depression Screening: PHQ 2/9 Scores 04/06/2014 08/02/2013  PHQ - 2 Score 0 0    Assessment:  Client  experiencing unstable CBG's. CBG's significantly increased S/P epidural steroid injection 03/25/14. CBG's 03/25/14 were 424, 566, 569 and HI (over 600). Client called nurse line and was directed to ED. Client went to [facility] and remained overnight. She was not admitted to the hospital. CBG's monitored 2x/day AM and PM since 03/25/14 with AM ranges 81-171 mg/dl and PM ranges 161-096 mg/dl. Client reports she is aware CBG increases with consumption of  sugary beverages. Reports consuming Pepsi and Sweet-Tea as well as eating an occasional small biscuit. She is knowledgeable of ADA diet and of the benefits of exercise. Client motivated to make healthy lifestyle choices to improve health. She was actively engaged in collaborative care planning and goal setting. Client came up with 95% of care plan interventions and set her own goals.  [facility] CM Care Plan        Patient Outreach from 04/06/2014 in [facility]   Care Plan Problem One  Increased CBG's   Care Plan for Problem One  Active   Interventions for Problem One Long Term Goal  1) Monitor CBG's 2x/day 2) Log CBG's in [facility] Calendar 3) Make notes when drink a sugary beverage or eat a high carb meal or snack 3) Take medications as ordered 4) Attend appoinment with Nicki Reaper 05/05/14 as scheduled   [facility] Long Term Goal (31-90 days)  A1 C will be less than 9.4 in 31 days   [facility] Long Term Goal Start Date  04/06/14   [facility] CM Short Term Goal #1 (0-30 days)  Client will lose 5# in the next 30 days   [facility] CM Short Term Goal #1 Start Date  04/06/14      Plan: Next Home Visit:  - Review care plan and progress towards goals - Revise care plan as necessary - Review [facility] Calendar for CBG/Weight and BP log - Verify attendance of 05/05/14 appointment with Nicki Reaper - Obtain 05/05/14 A1C value

## 2014-04-11 ENCOUNTER — Telehealth: Payer: Self-pay | Admitting: *Deleted

## 2014-04-12 NOTE — Patient Outreach (Signed)
Spoke with pt, next home visit scheduled for 4/22.

## 2014-04-15 DIAGNOSIS — M545 Low back pain: Secondary | ICD-10-CM | POA: Diagnosis not present

## 2014-04-21 ENCOUNTER — Other Ambulatory Visit: Payer: Self-pay | Admitting: Internal Medicine

## 2014-05-05 ENCOUNTER — Ambulatory Visit: Payer: Medicare Other | Admitting: Internal Medicine

## 2014-05-05 ENCOUNTER — Telehealth: Payer: Self-pay | Admitting: Internal Medicine

## 2014-05-05 DIAGNOSIS — Z0289 Encounter for other administrative examinations: Secondary | ICD-10-CM

## 2014-05-05 NOTE — Telephone Encounter (Signed)
appt made for 4/22

## 2014-05-05 NOTE — Telephone Encounter (Signed)
Patient did not come in for their appointment today for 3 mo follow up .  Please let me know if patient needs to be contacted immediately for follow up or no follow up needed. °

## 2014-05-05 NOTE — Telephone Encounter (Signed)
Left message for pt to call back to reschedule 05/05/14

## 2014-05-05 NOTE — Telephone Encounter (Signed)
Yes she needs to follow up 

## 2014-05-06 ENCOUNTER — Ambulatory Visit (INDEPENDENT_AMBULATORY_CARE_PROVIDER_SITE_OTHER): Payer: Medicare Other | Admitting: Internal Medicine

## 2014-05-06 ENCOUNTER — Other Ambulatory Visit: Payer: Self-pay | Admitting: *Deleted

## 2014-05-06 ENCOUNTER — Ambulatory Visit: Payer: Medicare Other | Admitting: *Deleted

## 2014-05-06 ENCOUNTER — Encounter: Payer: Self-pay | Admitting: Internal Medicine

## 2014-05-06 VITALS — BP 167/86 | HR 75 | Resp 20

## 2014-05-06 VITALS — BP 164/92 | HR 77 | Temp 98.1°F | Wt 277.5 lb

## 2014-05-06 DIAGNOSIS — IMO0002 Reserved for concepts with insufficient information to code with codable children: Secondary | ICD-10-CM | POA: Insufficient documentation

## 2014-05-06 DIAGNOSIS — E1165 Type 2 diabetes mellitus with hyperglycemia: Secondary | ICD-10-CM

## 2014-05-06 DIAGNOSIS — I10 Essential (primary) hypertension: Secondary | ICD-10-CM | POA: Diagnosis not present

## 2014-05-06 DIAGNOSIS — E785 Hyperlipidemia, unspecified: Secondary | ICD-10-CM

## 2014-05-06 DIAGNOSIS — M479 Spondylosis, unspecified: Secondary | ICD-10-CM

## 2014-05-06 LAB — CBC
HCT: 33.6 % — ABNORMAL LOW (ref 36.0–46.0)
Hemoglobin: 11 g/dL — ABNORMAL LOW (ref 12.0–15.0)
MCHC: 32.8 g/dL (ref 30.0–36.0)
MCV: 77.5 fl — ABNORMAL LOW (ref 78.0–100.0)
Platelets: 291 10*3/uL (ref 150.0–400.0)
RBC: 4.34 Mil/uL (ref 3.87–5.11)
RDW: 16.7 % — ABNORMAL HIGH (ref 11.5–15.5)
WBC: 7.8 10*3/uL (ref 4.0–10.5)

## 2014-05-06 LAB — COMPREHENSIVE METABOLIC PANEL WITH GFR
ALT: 12 U/L (ref 0–35)
AST: 18 U/L (ref 0–37)
Albumin: 3.8 g/dL (ref 3.5–5.2)
Alkaline Phosphatase: 154 U/L — ABNORMAL HIGH (ref 39–117)
BUN: 19 mg/dL (ref 6–23)
CO2: 30 meq/L (ref 19–32)
Calcium: 8.8 mg/dL (ref 8.4–10.5)
Chloride: 102 meq/L (ref 96–112)
Creatinine, Ser: 0.99 mg/dL (ref 0.40–1.20)
GFR: 71.07 mL/min
Glucose, Bld: 264 mg/dL — ABNORMAL HIGH (ref 70–99)
Potassium: 4.1 meq/L (ref 3.5–5.1)
Sodium: 137 meq/L (ref 135–145)
Total Bilirubin: 0.4 mg/dL (ref 0.2–1.2)
Total Protein: 7.3 g/dL (ref 6.0–8.3)

## 2014-05-06 LAB — LIPID PANEL
Cholesterol: 120 mg/dL (ref 0–200)
HDL: 38.1 mg/dL — ABNORMAL LOW
LDL Cholesterol: 51 mg/dL (ref 0–99)
NonHDL: 81.9
Total CHOL/HDL Ratio: 3
Triglycerides: 155 mg/dL — ABNORMAL HIGH (ref 0.0–149.0)
VLDL: 31 mg/dL (ref 0.0–40.0)

## 2014-05-06 LAB — HEMOGLOBIN A1C: Hgb A1c MFr Bld: 9.7 % — ABNORMAL HIGH (ref 4.6–6.5)

## 2014-05-06 MED ORDER — AMLODIPINE BESYLATE 10 MG PO TABS: 10.0000 mg | ORAL_TABLET | Freq: Every day | ORAL | Status: DC

## 2014-05-06 NOTE — Assessment & Plan Note (Signed)
Encouraged her to work on diet and exercise 

## 2014-05-06 NOTE — Patient Instructions (Signed)
Diabetes and Exercise Exercising regularly is important. It is not just about losing weight. It has many health benefits, such as:  Improving your overall fitness, flexibility, and endurance.  Increasing your bone density.  Helping with weight control.  Decreasing your body fat.  Increasing your muscle strength.  Reducing stress and tension.  Improving your overall health. People with diabetes who exercise gain additional benefits because exercise:  Reduces appetite.  Improves the body's use of blood sugar (glucose).  Helps lower or control blood glucose.  Decreases blood pressure.  Helps control blood lipids (such as cholesterol and triglycerides).  Improves the body's use of the hormone insulin by:  Increasing the body's insulin sensitivity.  Reducing the body's insulin needs.  Decreases the risk for heart disease because exercising:  Lowers cholesterol and triglycerides levels.  Increases the levels of good cholesterol (such as high-density lipoproteins [HDL]) in the body.  Lowers blood glucose levels. YOUR ACTIVITY PLAN  Choose an activity that you enjoy and set realistic goals. Your health care provider or diabetes educator can help you make an activity plan that works for you. Exercise regularly as directed by your health care provider. This includes:  Performing resistance training twice a week such as push-ups, sit-ups, lifting weights, or using resistance bands.  Performing 150 minutes of cardio exercises each week such as walking, running, or playing sports.  Staying active and spending no more than 90 minutes at one time being inactive. Even short bursts of exercise are good for you. Three 10-minute sessions spread throughout the day are just as beneficial as a single 30-minute session. Some exercise ideas include:  Taking the dog for a walk.  Taking the stairs instead of the elevator.  Dancing to your favorite song.  Doing an exercise  video.  Doing your favorite exercise with a friend. RECOMMENDATIONS FOR EXERCISING WITH TYPE 1 OR TYPE 2 DIABETES   Check your blood glucose before exercising. If blood glucose levels are greater than 240 mg/dL, check for urine ketones. Do not exercise if ketones are present.  Avoid injecting insulin into areas of the body that are going to be exercised. For example, avoid injecting insulin into:  The arms when playing tennis.  The legs when jogging.  Keep a record of:  Food intake before and after you exercise.  Expected peak times of insulin action.  Blood glucose levels before and after you exercise.  The type and amount of exercise you have done.  Review your records with your health care provider. Your health care provider will help you to develop guidelines for adjusting food intake and insulin amounts before and after exercising.  If you take insulin or oral hypoglycemic agents, watch for signs and symptoms of hypoglycemia. They include:  Dizziness.  Shaking.  Sweating.  Chills.  Confusion.  Drink plenty of water while you exercise to prevent dehydration or heat stroke. Body water is lost during exercise and must be replaced.  Talk to your health care provider before starting an exercise program to make sure it is safe for you. Remember, almost any type of activity is better than none. Document Released: 03/23/2003 Document Revised: 05/17/2013 Document Reviewed: 06/09/2012 ExitCare Patient Information 2015 [employer]. This information is not intended to replace advice given to you by your health care provider. Make sure you discuss any questions you have with your health care provider.  

## 2014-05-06 NOTE — Progress Notes (Signed)
Pre visit review using our clinic review tool, if applicable. No additional management support is needed unless otherwise documented below in the visit note. 

## 2014-05-06 NOTE — Assessment & Plan Note (Signed)
Will check CMET and Lipid profile today LDL goal is < 100 Continue Lipitor, will adjust if needed Handout given on low fat diet

## 2014-05-06 NOTE — Assessment & Plan Note (Signed)
Advised her to follow up with [name]

## 2014-05-06 NOTE — Assessment & Plan Note (Signed)
Noncompliant with diet and exercise Will check A1C and Microalbumin today Will adjust Lantus to keep fasting sugars below 140 Encouraged her to make an appt for an eye exam, especially given her blurred vision Flu UTD She declines pneumonia vaccine

## 2014-05-06 NOTE — Assessment & Plan Note (Signed)
BP elevated today Will add Norvasc to Losartan She takes Lasix prn for edema She will monitor her blood pressures at home and let me know if > 140/90 Will check CBC and CMET today

## 2014-05-06 NOTE — Progress Notes (Signed)
Subjective:    Patient ID: Bethany [name], female    DOB: Jun 02, 1945, 69 y.o.   MRN: [id number]  HPI  Pt presents to the clinic today for 3 month follow up of chronic medical conditions.  HTN: She is taking Losartan daily. She reports she has been monitoring it at home. It has been running 150/90-200/100. She has had some headache and blurred vision. She denies chest pain, chest tightness or shortness of breath. Her BP today is 164/92.  DM2: BS range 78-341. She has had some blurred vision. Her last eye exam was > 1 year ago. Flu shot 01/2014. She declines pneumonia vaccine. She denies numbness or tingling in her hands or feet. She denies non healing wounds. She is taking Amaryl and Lantus. [name] was 9.4%.  HLD: [name] was 72. She is taking Lipitor daily. She denies myalgias. She tries to consume a low fat diet.  Obesity: She has gained 2 lbs since her last visit. She is noncompliant with diabetic diet. She enjoys soda and sweet tea. She reports she is not able to exercise due to the pain in her back.  Review of Systems  Past Medical History  Diagnosis Date  . Arthritis   . Diabetes mellitus without complication   . Hyperlipidemia   . Hypertension     Current Outpatient Prescriptions  Medication Sig Dispense Refill  . aspirin 81 MG tablet Take 81 mg by mouth daily.    Marland Kitchen atorvastatin (LIPITOR) 20 MG tablet Take 1 tablet (20 mg total) by mouth daily. 90 tablet 0  . furosemide (LASIX) 20 MG tablet Take 20 mg by mouth daily as needed.    Marland Kitchen glimepiride (AMARYL) 4 MG tablet Take 1 tablet (4 mg total) by mouth daily with breakfast. 90 tablet 0  . Insulin Glargine (LANTUS) 100 UNIT/ML Solostar Pen Inject 30 Units into the skin daily at 10 pm. 30 mL 0  . losartan (COZAAR) 50 MG tablet Take 1 tablet (50 mg total) by mouth daily. 90 tablet 0  . naproxen sodium (ANAPROX) 220 MG tablet Take 220 mg by mouth as needed.     No current facility-administered medications for this  visit.    No Known Allergies  Family History  Problem Relation Age of Onset  . Arthritis Mother   . Cancer Mother     Ovarian  . Diabetes Mother   . Heart disease Father   . Hypertension Father     History   Social History  . Marital Status: Married    Spouse Name: N/A  . Number of Children: N/A  . Years of Education: N/A   Occupational History  . Not on file.   Social History Main Topics  . Smoking status: Never Smoker   . Smokeless tobacco: Never Used  . Alcohol Use: No  . Drug Use: No  . Sexual Activity: Not Currently   Other Topics Concern  . Not on file   Social History Narrative     Constitutional: Pt reports headache. Denies fever, malaise, fatigue, or abrupt weight changes.  HEENT: Pt reports blurred vision. Denies eye pain, eye redness, ear pain, ringing in the ears, wax buildup, runny nose, nasal congestion, bloody nose, or sore throat. Respiratory: Denies difficulty breathing, shortness of breath, cough or sputum production.   Cardiovascular: Denies chest pain, chest tightness, palpitations or swelling in the hands or feet.  Gastrointestinal: Denies abdominal pain, bloating, constipation, diarrhea or blood in the stool.  Musculoskeletal: Pt  reports back pain. Denies decrease in range of motion, difficulty with gait, muscle pain or joint swelling.  Skin: Denies redness, rashes, lesions or ulcercations.  Neurological: Denies dizziness, difficulty with memory, difficulty with speech or problems with balance and coordination.   No other specific complaints in a complete review of systems (except as listed in HPI above).     Objective:   Physical Exam   BP 164/92 mmHg  Pulse 77  Temp(Src) 98.1 F (36.7 C) (Oral)  Wt 277 lb 8 oz (125.873 kg)  SpO2 99% Wt Readings from Last 3 Encounters:  05/06/14 277 lb 8 oz (125.873 kg)  04/06/14 271 lb (122.925 kg)  03/07/14 275 lb 8 oz (124.966 kg)    General: Appears her stated age, obese in NAD. Skin:  Warm, dry and intact. No rashes, lesions or ulcerations noted. HEENT: Head: normal shape and size; Eyes: sclera white, no icterus, conjunctiva pink, PERRLA and EOMs intact;  Cardiovascular: Normal rate and rhythm. S1,S2 noted.  No murmur, rubs or gallops noted. No JVD. Trace BLE edema. No carotid bruits noted. Pulmonary/Chest: Normal effort and positive vesicular breath sounds. No respiratory distress. No wheezes, rales or ronchi noted.  Musculoskeletal: Decreased flexion, extension and rotation of the back. Strength 5/5 BLE. No difficulty with gait.  Neurological: Alert and oriented.    BMET    Component Value Date/Time   NA 141 08/02/2013 0954   K 3.9 08/02/2013 0954   CL 106 08/02/2013 0954   CO2 28 08/02/2013 0954   GLUCOSE 96 08/02/2013 0954   BUN 12 08/02/2013 0954   CREATININE 1.1 08/02/2013 0954   CALCIUM 9.0 08/02/2013 0954    Lipid Panel     Component Value Date/Time   CHOL 142 08/02/2013 0954   TRIG 136.0 08/02/2013 0954   HDL 43.30 08/02/2013 0954   CHOLHDL 3 08/02/2013 0954   VLDL 27.2 08/02/2013 0954   LDLCALC 72 08/02/2013 0954    CBC    Component Value Date/Time   WBC 5.6 08/02/2013 0954   RBC 4.34 08/02/2013 0954   HGB 11.4* 08/02/2013 0954   HCT 35.2* 08/02/2013 0954   PLT 289.0 08/02/2013 0954   MCV 81.1 08/02/2013 0954   MCHC 32.4 08/02/2013 0954   RDW 15.5 08/02/2013 0954    Hgb A1C Lab Results  Component Value Date   HGBA1C 9.4* 01/27/2014        Assessment & Plan:   RTC in 3 weeks to recheck BP

## 2014-05-07 LAB — MICROALBUMIN / CREATININE URINE RATIO
Creatinine, Urine: 70.2 mg/dL
Microalb Creat Ratio: 21.4 mg/g (ref 0.0–30.0)
Microalb, Ur: 1.5 mg/dL (ref <2.0)

## 2014-05-09 ENCOUNTER — Encounter: Payer: Self-pay | Admitting: *Deleted

## 2014-05-09 NOTE — Patient Outreach (Signed)
[facility]) Care Management  [facility] Care Manager  05/09/2014   Bethany [name] [birth year] [id number]  Subjective: Pt reports having chronic  Back pain, take 2 Aleve a day, tolerate pain.  Pt states she is not taking (rx) pain med.  Pt states she received 2 steroid shots last month, BP and sugar up since.  Pt states she was suppose to see MD 4/21, got appointment mixed up with [facility], to see MD today.   Pt states blood sugar last  Night was 163.   Pt states picked up weight since last month, not able to cook because of pain, doing quick meals, take out.   Objective:  Vitals- BP 150/70, pulse 75,O2 sat 97, respirations 20                                 BP with pt's machine- 167/86.                                 Lungs clear.                      Blood sugar today 235- taken during home visit (no breakfast).  Ranges per pt's recordings 69-341.  Current Medications:  Current Outpatient Prescriptions  Medication Sig Dispense Refill  . aspirin 81 MG tablet Take 81 mg by mouth daily.    Marland Kitchen atorvastatin (LIPITOR) 20 MG tablet Take 1 tablet (20 mg total) by mouth daily. 90 tablet 0  . furosemide (LASIX) 20 MG tablet Take 20 mg by mouth daily as needed.    Marland Kitchen glimepiride (AMARYL) 4 MG tablet Take 1 tablet (4 mg total) by mouth daily with breakfast. 90 tablet 0  . Insulin Glargine (LANTUS) 100 UNIT/ML Solostar Pen Inject 30 Units into the skin daily at 10 pm. 30 mL 0  . losartan (COZAAR) 50 MG tablet Take 1 tablet (50 mg total) by mouth daily. 90 tablet 0  . naproxen sodium (ANAPROX) 220 MG tablet Take 220 mg by mouth as needed.    Marland Kitchen amLODipine (NORVASC) 10 MG tablet Take 1 tablet (10 mg total) by mouth daily. 30 tablet 0   No current facility-administered medications for this visit.     Fall/Depression Screening: PHQ 2/9 Scores 04/06/2014 08/02/2013  PHQ - 2 Score 0 0    Assessment:   DM- fluctuation in blood sugars noted.  Pt sometimes skip breakfast, need  To include                    Protein with meals.                            HTN:  BP 150/70 with nurse's cuff, 167/86 with pt's machine.   Plan:  Pt to f/u with Nicki Reaper NP today.              Pt to use another BP machine to check BP, record, call MD if elevated.            Pt to continue to check blood sugars/record/bring results to MD appointments            Continue to follow pt with RN CM services, next scheduled visit 5/23, update care plan  As needed.    Care plan:  Problem 1- Fluctuation in blood sugars                                         Short term goal #1- pt be familiar with 3 good carbs in the next 30 days.                                          Intervention- Carb count sheet given- discussed good carb, portion sizes.                                         Short term goal #2- pt to loose 2 lbs in the next 30 days                                           Intervention: Discussed not to skip meals, include protein with meals.                                        Problem 2- Elevated BP                                          Short term goal- pt to use other BP machine,check/record BP in next 30 days                                          Intervention- discussed checking BP often/record, call MD if elevated.     Shayne Alkenose M.   Amori Cooperman RN CCM [facility] Care Management  [phone]

## 2014-05-09 NOTE — Patient Outreach (Signed)
[facility]) Care Management  05/09/2014  Bethany [name] 1945-08-26 [id number]   View in Epic today-  A1C results from 4/22- 9.7                                 Long term goal started on 4/25-  A1C  Would be less than 9.4 in the next 90 days.     Shayne Alkenose M.   Tushar Enns RN CCM [facility] Care [facility]  [phone]

## 2014-05-10 NOTE — Addendum Note (Signed)
Addended by: Roena MaladyEVONTENNO, Darcella Shiffman Y on: 05/10/2014 11:18 AM   Modules accepted: Medications

## 2014-05-17 ENCOUNTER — Other Ambulatory Visit: Payer: Self-pay | Admitting: *Deleted

## 2014-05-17 ENCOUNTER — Encounter: Payer: Self-pay | Admitting: *Deleted

## 2014-05-17 DIAGNOSIS — E1169 Type 2 diabetes mellitus with other specified complication: Secondary | ICD-10-CM

## 2014-05-17 NOTE — Patient Outreach (Signed)
Attempt made to contact pt in response to voice message left earlier related to help with cost of insulin.  HIPPA compliant voice message left contact number.  [facility].     Shayne Alkenose M.   Shirelle Tootle RN CCM [facility] Care [facility]  [phone]

## 2014-05-17 NOTE — Patient Outreach (Signed)
Received a voice message from pt earlier today requesting information about pharmacy help.  Pt states went to get her insulin today, was told cost would be $196.54.   Pt states she was told may be able to get help from [facility] pharmacist.  Pt requested a return phone call.      Shayne Alkenose M.   Pierzchala RN CCM [facility] Care Management  [phone]

## 2014-05-18 ENCOUNTER — Other Ambulatory Visit: Payer: Self-pay | Admitting: *Deleted

## 2014-05-18 NOTE — Patient Outreach (Signed)
Received a call from pt today, returning RN CM's call from yesterday (voice message left).  RN CM informed pt in response to her voice message yesterday (requesting help with her insulin cost), [facility].  Pt inquired how long it would take, out of insulin.   RN CM inquired of pt if deductible was connected to insulin cost to which pt said yes.  RN CM informed pt [facility] does not  assist with deductibles.  Pt states  planning to go to pharmacy (see what she can work out).  Pt states missed her insulin yesterday.    Plan to f/u with pt 5/23 (home visit).    Shayne Alkenose M.   Pierzchala RN CCM [facility] Care [facility]  [phone]

## 2014-05-27 DIAGNOSIS — Z9849 Cataract extraction status, unspecified eye: Secondary | ICD-10-CM | POA: Diagnosis not present

## 2014-05-27 LAB — HM DIABETES EYE EXAM

## 2014-05-30 ENCOUNTER — Encounter: Payer: Self-pay | Admitting: Internal Medicine

## 2014-05-30 ENCOUNTER — Ambulatory Visit (INDEPENDENT_AMBULATORY_CARE_PROVIDER_SITE_OTHER): Payer: Medicare Other | Admitting: Internal Medicine

## 2014-05-30 VITALS — BP 138/76 | HR 76 | Temp 97.4°F | Wt 278.0 lb

## 2014-05-30 DIAGNOSIS — I10 Essential (primary) hypertension: Secondary | ICD-10-CM

## 2014-05-30 DIAGNOSIS — M545 Low back pain, unspecified: Secondary | ICD-10-CM

## 2014-05-30 DIAGNOSIS — R609 Edema, unspecified: Secondary | ICD-10-CM | POA: Insufficient documentation

## 2014-05-30 NOTE — Progress Notes (Signed)
Pre visit review using our clinic review tool, if applicable. No additional management support is needed unless otherwise documented below in the visit note. 

## 2014-05-30 NOTE — Patient Instructions (Signed)

## 2014-05-30 NOTE — Addendum Note (Signed)
Addended by: Lorre MunroeBAITY, REGINA W on: 05/30/2014 12:33 PM   Modules accepted: Kipp BroodSmartSet

## 2014-05-30 NOTE — Assessment & Plan Note (Signed)
Advised her to try to avoid using the Lasix if she doesn't have to Try elevating your legs while sitting down Advised her to try compression hose, she reports she cannot get them on and off.

## 2014-05-30 NOTE — Assessment & Plan Note (Signed)
BP well controlled on Norvasc and Losartan Will continue for now

## 2014-05-30 NOTE — Progress Notes (Addendum)
Subjective:    Patient ID: Bethany [name], female    DOB: 04-28-1945, 69 y.o.   MRN: [id number]  HPI  Pt presents to the clinic today for 3 week follow up of HTN. She was started on Norvasc at her last visit in addition to her Losartan. Her blood pressure today is 138/76. She reports she has felt lightheaded and weak at times. She has been monitoring her BP at home and has gotten varied blood pressures but nothing consistent. She takes Lasix 1-2 times per week for edema in her lower legs.  She also wants to discuss midline low back pain. This seems to be getting worse. She was seeing Dr. Farris [name], orthopedics. Xray from 07/2013 showed: FINDINGS: Mild convex right scoliosis of the lumbar spine. Mild bilateral L4-5 and and moderate L5-S1 degenerative facet change. Minimal anterior listhesis of L4 on L5 likely due to degenerative facet change. Moderate L1-2 degenerative disc disease. Mild L3-4 and mild-to-moderate L4-5 degenerative disc disease. Mild to moderate L5-S1 degenerative disc disease. Mild bilateral sacroiliac degenerative change. MRI from 02/2014 showed: IMPRESSION: L4-5 moderate disc degeneration right-greater-than-left with endplate edema and sclerosis. Grade 1 anterior slip related to facet degeneration. This is causing mild spinal stenosis. No significant foraminal encroachment.  Diffuse disc and facet degeneration throughout the lumbar spine without other areas of spinal stenosis. She reports Dr. Farris [name] gave her a epidural steroid injection but it did not seem to work. She hurts all the time. She is taking Aleve daily. The Tramadol did not see not seem to help. She is interested in a referral to neurosurgery for further evaluation.  Review of Systems      Past Medical History  Diagnosis Date  . Arthritis   . Diabetes mellitus without complication   . Hyperlipidemia   . Hypertension     Current Outpatient Prescriptions  Medication Sig Dispense Refill  . amLODipine  (NORVASC) 10 MG tablet Take 1 tablet (10 mg total) by mouth daily. 30 tablet 0  . aspirin 81 MG tablet Take 81 mg by mouth daily.    Marland Kitchen. atorvastatin (LIPITOR) 20 MG tablet Take 1 tablet (20 mg total) by mouth daily. 90 tablet 0  . furosemide (LASIX) 20 MG tablet Take 20 mg by mouth daily as needed.    Marland Kitchen. glimepiride (AMARYL) 4 MG tablet Take 1 tablet (4 mg total) by mouth daily with breakfast. 90 tablet 0  . Insulin Glargine (LANTUS) 100 UNIT/ML Solostar Pen Inject 30 Units into the skin daily at 10 pm. (Patient taking differently: Inject 35 Units into the skin daily at 10 pm. ) 30 mL 0  . losartan (COZAAR) 50 MG tablet Take 1 tablet (50 mg total) by mouth daily. 90 tablet 0  . naproxen sodium (ANAPROX) 220 MG tablet Take 220 mg by mouth as needed.     No current facility-administered medications for this visit.    No Known Allergies  Family History  Problem Relation Age of Onset  . Arthritis Mother   . Cancer Mother     Ovarian  . Diabetes Mother   . Heart disease Father   . Hypertension Father     History   Social History  . Marital Status: Married    Spouse Name: N/A  . Number of Children: N/A  . Years of Education: N/A   Occupational History  . Not on file.   Social History Main Topics  . Smoking status: Never Smoker   . Smokeless tobacco: Never Used  .  Alcohol Use: No  . Drug Use: No  . Sexual Activity: Not Currently   Other Topics Concern  . Not on file   Social History Narrative     Constitutional: Denies fever, malaise, fatigue, headache or abrupt weight changes.  Respiratory: Denies difficulty breathing, shortness of breath, cough or sputum production.   Cardiovascular: Pt reports swelling in her feet. Denies chest pain, chest tightness, palpitations or swelling in the hands.  Musculoskeletal: Pt reports low back pain. Denies muscle pain or joint swelling.   Neurological: Denies dizziness, difficulty with memory, difficulty with speech or problems with  balance and coordination.   No other specific complaints in a complete review of systems (except as listed in HPI above).  Objective:   Physical Exam  BP 138/76 mmHg  Pulse 76  Temp(Src) 97.4 F (36.3 C) (Oral)  Wt 278 lb (126.1 kg)  SpO2 98% Wt Readings from Last 3 Encounters:  05/30/14 278 lb (126.1 kg)  05/06/14 277 lb 8 oz (125.873 kg)  04/06/14 271 lb (122.925 kg)    General: Appears her stated age, obese in NAD. HEENT: Head: normal shape and size; Eyes: sclera white, no icterus, conjunctiva pink, PERRLA and EOMs intact;  Cardiovascular: Normal rate and rhythm. S1,S2 noted.  No murmur, rubs or gallops noted. 2+ BLE edema. No carotid bruits noted. Pulmonary/Chest: Normal effort and positive vesicular breath sounds. No respiratory distress. No wheezes, rales or ronchi noted.  Musculoskeletal: Decreased flexion, extension and rotation of the spine. Pinpoint tenderness noted over the lumbar spine. Strength 5/5 BLE. It does take her a while to get up and get moving. She does have a wobbly gait in the beginning. Neurological: Alert and oriented. Sensation intact to BLE.  BMET    Component Value Date/Time   NA 137 05/06/2014 1448   K 4.1 05/06/2014 1448   CL 102 05/06/2014 1448   CO2 30 05/06/2014 1448   GLUCOSE 264* 05/06/2014 1448   BUN 19 05/06/2014 1448   CREATININE 0.99 05/06/2014 1448   CALCIUM 8.8 05/06/2014 1448    Lipid Panel     Component Value Date/Time   CHOL 120 05/06/2014 1448   TRIG 155.0* 05/06/2014 1448   HDL 38.10* 05/06/2014 1448   CHOLHDL 3 05/06/2014 1448   VLDL 31.0 05/06/2014 1448   LDLCALC 51 05/06/2014 1448    CBC    Component Value Date/Time   WBC 7.8 05/06/2014 1448   RBC 4.34 05/06/2014 1448   HGB 11.0* 05/06/2014 1448   HCT 33.6* 05/06/2014 1448   PLT 291.0 05/06/2014 1448   MCV 77.5* 05/06/2014 1448   MCHC 32.8 05/06/2014 1448   RDW 16.7* 05/06/2014 1448    Hgb A1C Lab Results  Component Value Date   HGBA1C 9.7* 05/06/2014           Assessment & Plan:   Low back pain:  Xray and MRI reviewed Discussed options of pain management versus neurosurgery referral She thinks she wants a referral to neurosurgery but will discuss with Dr. Farris [name]  RTC in 2 months for follow up of chronic conditions, sooner if needed

## 2014-06-03 ENCOUNTER — Other Ambulatory Visit: Payer: Self-pay

## 2014-06-03 NOTE — Patient Outreach (Signed)
I called [name] to see if she needed assistance with her insulin.  She stated she was able to pick up two months worth of insulin.  I told her we could see if she qualified for Extra Help and apply to the pharmaceutical company for assistance as well.  She stated she would appreciate this.  I also stated if it was not resolved by the time she needed to get another refill to let me know.  She stated she would.  I will refer her to our care manager assistant to do the Extra Help application and pharmaceutical assistance.    Steve Rattlerawn Brittish Bolinger, PharmD, [employer]ACP [facility] Pharmacy Manager [phone]

## 2014-06-06 ENCOUNTER — Other Ambulatory Visit: Payer: Self-pay | Admitting: *Deleted

## 2014-06-06 ENCOUNTER — Ambulatory Visit: Payer: Medicare Other | Admitting: *Deleted

## 2014-06-06 VITALS — BP 142/80 | HR 77 | Resp 16 | Ht 67.5 in | Wt 278.0 lb

## 2014-06-06 DIAGNOSIS — IMO0002 Reserved for concepts with insufficient information to code with codable children: Secondary | ICD-10-CM

## 2014-06-06 DIAGNOSIS — E1165 Type 2 diabetes mellitus with hyperglycemia: Secondary | ICD-10-CM

## 2014-06-06 NOTE — Patient Outreach (Signed)
[facility]) Care Management  [place]  06/06/2014   SYNTHIA [employer] July 13, 1945 [id number]  Subjective:  Pt reports saw NP last week, no lab work done.  Pt states she has been checking her blood  Sugars every other day, not buying bread, substituting for sweet potato/spaghetti squash.   Pt states sugar  This morning was 121.  Pt states she did get her eyes checked, has 20/20 in both eyes, just needs  Reading glasses.   Pt reports with  back pain- not able to get around, thinking about getting the surgery. Pt states as long as she is sitting, back is okay.   Pt states she wants to get back to [facility].     Objective:   Filed Vitals:   06/06/14 1028  BP: 142/80  Pulse: 77  Resp: 16     Current Medications:  Current Outpatient Prescriptions  Medication Sig Dispense Refill  . amLODipine (NORVASC) 10 MG tablet Take 1 tablet (10 mg total) by mouth daily. 30 tablet 0  . aspirin 81 MG tablet Take 81 mg by mouth daily.    Marland Kitchen atorvastatin (LIPITOR) 20 MG tablet Take 1 tablet (20 mg total) by mouth daily. 90 tablet 0  . furosemide (LASIX) 20 MG tablet Take 20 mg by mouth daily as needed.    Marland Kitchen glimepiride (AMARYL) 4 MG tablet Take 1 tablet (4 mg total) by mouth daily with breakfast. 90 tablet 0  . Insulin Glargine (LANTUS) 100 UNIT/ML Solostar Pen Inject 30 Units into the skin daily at 10 pm. (Patient taking differently: Inject 35 Units into the skin daily at 10 pm. ) 30 mL 0  . losartan (COZAAR) 50 MG tablet Take 1 tablet (50 mg total) by mouth daily. 90 tablet 0  . naproxen sodium (ANAPROX) 220 MG tablet Take 220 mg by mouth as needed.     No current facility-administered medications for this visit.    Assessment:  DM-  Fluctuation in blood sugars (am 106- 235, pm 134- 363).   Chronic back pain- currently no pain (pt sitting).    Plan:   Pt to continue to check blood sugars/record/bring readings to next MD visit             Pt to join [employer], to help with weight loss   and back pain              Plan to continue to provide community nurse case management services, next home visit 6/23.   [facility] CM Care Plan Problem One        Patient Outreach from 06/06/2014 in [place] Problem One  Increased CBG's   Care Plan for Problem One  Active   [facility] Long Term Goal (31-90 days)  A1c will be less than 9 in the next 60 days    [facility] Long Term Goal Start Date  06/06/14   Interventions for Problem One Long Term Goal  Pt to monitor blood sugars daily/continue to monitor carb intake, portion sizes    [facility] CM Short Term Goal #1 (0-30 days)  Pt will be familiar with 3 good carbs in the next 30 days    [facility] CM Short Term Goal #1 Start Date  05/06/14   [facility] CM Short Term Goal #1 Met Date  06/06/14   Interventions for Short Term Goal #1  Basic carb count sheet given- discussed good carbs, portion sizes    [facility] CM Short Term Goal #  2 (0-30 days)  Pt will loose 2 lbs in the next 30 days    [facility] CM Short Term Goal #2 Met Date  -- [not met ]   Interventions for Short Term Goal #2  Pt not to skip meals, include protein with meals    [facility] CM Short Term Goal #3 (0-30 days)  Pt would join [employer] - goal to loose 2 lbs in the next 30 days    [facility] CM Short Term Goal #3 Start Date  06/06/14   Interventions for Short Tern Goal #3  Discussed with pt exercise helps with weight loss     [facility] CM Care Plan Problem Two        Patient Outreach from 06/06/2014 in [place] Problem Two  Elevated BP    Care Plan for Problem Two  Active   [facility] CM Short Term Goal #1 (0-30 days)  Pt to use other BP machine/record in the next 30 days    [facility] CM Short Term Goal #1 Start Date  05/06/14   [facility] CM Short Term Goal #1 Met Date   06/06/14 [most recent MD visit 138/76, today 142/80 (before meds)]   Interventions for Short Term Goal #2   Discussed with pt checking BP often/record/call MD if elevated.       [name]   [place] Care Management   [phone]

## 2014-06-08 ENCOUNTER — Encounter: Payer: Self-pay | Admitting: Internal Medicine

## 2014-06-11 ENCOUNTER — Other Ambulatory Visit: Payer: Self-pay | Admitting: Internal Medicine

## 2014-06-14 ENCOUNTER — Other Ambulatory Visit: Payer: Self-pay | Admitting: Internal Medicine

## 2014-06-20 ENCOUNTER — Telehealth: Payer: Self-pay | Admitting: *Deleted

## 2014-06-20 NOTE — Telephone Encounter (Signed)
Mammogram f/u call: pt had mammogram last year but different doctor referred her for mammogram, I called and [facility] and updated chart

## 2014-06-22 ENCOUNTER — Other Ambulatory Visit: Payer: Self-pay

## 2014-06-22 NOTE — Patient Outreach (Signed)
I called Bethany [name] to discuss applying for Extra Help.  She stated she was in the car and unable to do the application right now.  She asked if I could call her back.  I stated I would call her on Friday, June 24, 2014 to do the application.  She stated this was fine.    Steve Rattlerawn Lineth Thielke, PharmD, [employer] [facility] Pharmacy Manager [phone]

## 2014-06-24 ENCOUNTER — Other Ambulatory Visit: Payer: Self-pay

## 2014-06-24 NOTE — Patient Outreach (Signed)
I called Ms. Shingledecker today to enroll her into Extra Help.  She was willing to do it over the phone.  We completed the application online and submitted it today.  She should hear from social security administration in the next 2 to 4 weeks.  She is going to call Damita Rhodie, care management assistant back when she hears from them.  If she gets denied, Rochele Pages will begin the patient assistance application for Lantus.  She stated she could afford all her other medications.  If Ms. Dabney does not call Damita back within 4 weeks, Damita will reach out to her.  I am happy to follow up as needed on future pharmacy request.    Steve Rattler, PharmD, [facility] [employer] [phone]

## 2014-07-07 ENCOUNTER — Other Ambulatory Visit: Payer: Self-pay | Admitting: *Deleted

## 2014-07-07 VITALS — BP 140/72 | HR 78 | Resp 16

## 2014-07-07 DIAGNOSIS — E1165 Type 2 diabetes mellitus with hyperglycemia: Secondary | ICD-10-CM

## 2014-07-07 DIAGNOSIS — IMO0002 Reserved for concepts with insufficient information to code with codable children: Secondary | ICD-10-CM

## 2014-07-08 ENCOUNTER — Encounter: Payer: Self-pay | Admitting: Internal Medicine

## 2014-07-08 NOTE — Patient Outreach (Signed)
[facility]) Care Management  [place]  07/08/2014   Bethany [name] [birth year] [id number]  Subjective:  Pt states her back is main problem, pain better but is weak, have to stop and relax after Walking short distances).  Pt states she is not going to take any more shots, went twice,did not help, health not as good after taking the shots (BP, sugar up).  Pt states she left her glucometer and readings in  [place] On vacation- thought lost it- they are mailing it back.  Pt states been without her glucometer for 1-2 weeks, Today sugar was 172.   Pt states her sugars were up and down, not been making any sweet Tea, only has Pepsi when sugar is low.  Pt states 2-3 times had low blood sugars,   Pt states she is still in the  process of applying for extra help for her meds.  Pt states she is to f/u with MD next week (check BP, lab Work).  Pt states has not been checking her BP, last visit with MD- BP med increased.  Pt states her daughter  Is going to get her a new BP machine.   Objective:  Alert and orientated x3, lungs clear.   Filed Vitals:   07/07/14 1042  BP: 140/72  Pulse: 78  Resp: 16     Current Medications:  Current Outpatient Prescriptions  Medication Sig Dispense Refill  . amLODipine (NORVASC) 10 MG tablet TAKE 1 TABLET BY MOUTH DAILY 30 tablet 2  . aspirin 81 MG tablet Take 81 mg by mouth daily.    Marland Kitchen atorvastatin (LIPITOR) 20 MG tablet TAKE 1 TABLET BY MOUTH DAILY 90 tablet 0  . furosemide (LASIX) 20 MG tablet Take 20 mg by mouth daily as needed.    Marland Kitchen glimepiride (AMARYL) 4 MG tablet Take 1 tablet (4 mg total) by mouth daily with breakfast. 90 tablet 0  . Insulin Glargine (LANTUS) 100 UNIT/ML Solostar Pen Inject 30 Units into the skin daily at 10 pm. (Patient taking differently: Inject 35 Units into the skin daily at 10 pm. ) 30 mL 0  . losartan (COZAAR) 50 MG tablet Take 1 tablet (50 mg total) by mouth daily. 90 tablet 0  . naproxen sodium  (ANAPROX) 220 MG tablet Take 220 mg by mouth as needed.     No current facility-administered medications for this visit.     Assessment:   Back pain- chronic, +6 on pain scale, walking short distance- has to sit and rest.                          DM-  pt reports sugars  up and down- left glucometer/readings  in [facility], to mail back, today reports sugar 172.   Had 2-3 low readings-  Felt sugar low, reports  used Pepsi to bring up.                           BP- today 140/72,  Pt's BP machine not working, daughter to purchase new one.   Plan:   Pt to start back checking blood sugars often/record once receive glucometer back.             Reinforced need for pt to include protein with pm snack, have glucose tablets/candy by bedside                   When  sugar drops.              Pt to start checking BP, record, bring results to next MD visit.             Plan to continue to provide pt with community nurse case management services, next h/v  7/26.   [facility] CM Care Plan Problem One        Patient Outreach from 07/07/2014 in [place] for Problem One  Active   THN Long Term Goal (31-90 days)  A1c will be less than 9 in the next 60 days    THN Long Term Goal Start Date  06/06/14   Interventions for Problem One Long Term Goal  Pt to monitor blood sugars daily/continue to monitor carb intake, portion sizes    [facility]N CM Short Term Goal #3 Met Date  -- [not met- pt has not f/u at [facility], did not loose 2 lbs. ]   THN CM Short Term Goal #4 (0-30 days)  Pt would not have any low blood sugar readings in the next 30 days    THN CM Short Term Goal #4 Start Date  07/07/14   Interventions for Short Term Goal #4  With pt reporting 2-3 low readings, discussed having glucose/candy by her bedside to use if needed.  Also, reinforced importaince of having pm  snack/include protein.     [facility] CM Care Plan Problem Two        Patient Outreach from 06/06/2014 in [place] Problem Two  Elevated BP    Care Plan for Problem Two  Active   THN CM Short Term Goal #1 (0-30 days)  Pt to use other BP machine/record in the next 30 days    THN CM Short Term Goal #1 Start Date  05/06/14   [facility] CM Short Term Goal #1 Met Date   06/06/14 [most recent MD visit 138/76, today 142/80 (before meds)]   Interventions for Short Term Goal #2   Discussed with pt checking BP often/record/call MD if elevated.      [name]   [facility] Management  [phone]

## 2014-07-21 NOTE — Patient Outreach (Signed)
[facility]) Care Management  07/21/2014  Bethany MansMary R [name] 23-May-1945 [id number]   Attempted outreach call to patient to verify the status of the extra help application.Patient was unavailable and I had to leave a HIPPA compliant message for her to call me back.  Sione Baumgarten L. Dorcas CarrowRhodie, AAS [facility] Care Management Assistant [phone] [phone]

## 2014-07-25 NOTE — Patient Outreach (Signed)
[facility]) Care Management  07/25/2014  Bethany [name] [birth year] [id number]   Telephone call from patient, returning my phone call. Patient informed me that she has not yet received any correspondence from the extra help application that we filled out online. Once she receives the decision in the mail, she will contact me.  [name] L. Dorcas CarrowRhodie, AAS [facility] Care Management Assistant [phone] [phone]

## 2014-08-01 ENCOUNTER — Encounter: Payer: Self-pay | Admitting: Internal Medicine

## 2014-08-01 ENCOUNTER — Ambulatory Visit (INDEPENDENT_AMBULATORY_CARE_PROVIDER_SITE_OTHER): Payer: Medicare Other | Admitting: Internal Medicine

## 2014-08-01 VITALS — BP 138/72 | HR 77 | Temp 98.1°F | Wt 285.0 lb

## 2014-08-01 DIAGNOSIS — I10 Essential (primary) hypertension: Secondary | ICD-10-CM | POA: Diagnosis not present

## 2014-08-01 DIAGNOSIS — R609 Edema, unspecified: Secondary | ICD-10-CM

## 2014-08-01 DIAGNOSIS — M545 Low back pain, unspecified: Secondary | ICD-10-CM | POA: Insufficient documentation

## 2014-08-01 DIAGNOSIS — E785 Hyperlipidemia, unspecified: Secondary | ICD-10-CM

## 2014-08-01 DIAGNOSIS — E1165 Type 2 diabetes mellitus with hyperglycemia: Secondary | ICD-10-CM | POA: Diagnosis not present

## 2014-08-01 LAB — CBC
HCT: 35.2 % — ABNORMAL LOW (ref 36.0–46.0)
Hemoglobin: 11.4 g/dL — ABNORMAL LOW (ref 12.0–15.0)
MCHC: 32.5 g/dL (ref 30.0–36.0)
MCV: 79.8 fl (ref 78.0–100.0)
Platelets: 331 10*3/uL (ref 150.0–400.0)
RBC: 4.4 Mil/uL (ref 3.87–5.11)
RDW: 16.2 % — ABNORMAL HIGH (ref 11.5–15.5)
WBC: 6.9 10*3/uL (ref 4.0–10.5)

## 2014-08-01 LAB — COMPREHENSIVE METABOLIC PANEL
ALT: 12 U/L (ref 0–35)
AST: 17 U/L (ref 0–37)
Albumin: 3.9 g/dL (ref 3.5–5.2)
Alkaline Phosphatase: 167 U/L — ABNORMAL HIGH (ref 39–117)
BUN: 17 mg/dL (ref 6–23)
CO2: 30 mEq/L (ref 19–32)
Calcium: 9.2 mg/dL (ref 8.4–10.5)
Chloride: 107 mEq/L (ref 96–112)
Creatinine, Ser: 1.02 mg/dL (ref 0.40–1.20)
GFR: 68.62 mL/min (ref >60.00)
Glucose, Bld: 134 mg/dL — ABNORMAL HIGH (ref 70–99)
Potassium: 4.1 mEq/L (ref 3.5–5.1)
Sodium: 143 mEq/L (ref 135–145)
Total Bilirubin: 0.4 mg/dL (ref 0.2–1.2)
Total Protein: 7.5 g/dL (ref 6.0–8.3)

## 2014-08-01 LAB — HEMOGLOBIN A1C: Hgb A1c MFr Bld: 9 % — ABNORMAL HIGH (ref 4.6–6.5)

## 2014-08-01 NOTE — Assessment & Plan Note (Signed)
Continue prn lasix Will check CMET today

## 2014-08-01 NOTE — Assessment & Plan Note (Signed)
Encouraged her to try water aerobics to get some exercise Advised her to work on her diet

## 2014-08-01 NOTE — Progress Notes (Signed)
Pre visit review using our clinic review tool, if applicable. No additional management support is needed unless otherwise documented below in the visit note. 

## 2014-08-01 NOTE — Assessment & Plan Note (Signed)
Chronic Injections didn't work Discussed how weight loss could help improve her back pain She is not interested in surgical intervention Continue Naproxen

## 2014-08-01 NOTE — Assessment & Plan Note (Addendum)
BP rechecked 138/72 Will continue Norvasc and Losartan Will check CBC and CMET today ECG from 2012 reviewed

## 2014-08-01 NOTE — Patient Instructions (Signed)

## 2014-08-01 NOTE — Progress Notes (Signed)
Subjective:    Patient ID: Bethany [name], female    DOB: 1945-06-28, 69 y.o.   MRN: [id number]  HPI  Pt presents to the clinic today for 3 month follow up of chronic conditions.  DM 2: [name] is 9.7%. She does not test her sugars. She takes Lantus and Amaryl. She has not noticed any hypoglycemia. She does not adhere to a diabetic diet. She is unable to exercise due to her back pain. Her last eye exam was 05/2014 (no retinopathy). Flu 01/2014. Pneumovax never. Prevnar never.  HTN: BP well controlled on Norvasc and Losartan. Her BP today is 146/72. She denies chest pain, chest tightness or shortness of breath.  HLD: She denies myalgias on Lipitor. [name] was 51, Triglycerides [id number]. She does not consume a low fat diet.  Peripheral edema: The swelling is worse throughout the day. She takes Lasix once a day as needed. She does keep her feet elevated.  Back pain: She had injections a few months ago but reports it did not work. She can not do the thing she used to do because of her back pain. She has gained 7 lbs over the last 2 months. She was told if the injections didn't work, then her only other option was surgery.  Review of Systems      Past Medical History  Diagnosis Date  . Arthritis   . Diabetes mellitus without complication   . Hyperlipidemia   . Hypertension     Current Outpatient Prescriptions  Medication Sig Dispense Refill  . amLODipine (NORVASC) 10 MG tablet TAKE 1 TABLET BY MOUTH DAILY 30 tablet 2  . aspirin 81 MG tablet Take 81 mg by mouth daily.    Marland Kitchen. atorvastatin (LIPITOR) 20 MG tablet TAKE 1 TABLET BY MOUTH DAILY 90 tablet 0  . furosemide (LASIX) 20 MG tablet Take 20 mg by mouth daily as needed.    Marland Kitchen. glimepiride (AMARYL) 4 MG tablet Take 1 tablet (4 mg total) by mouth daily with breakfast. 90 tablet 0  . Insulin Glargine (LANTUS) 100 UNIT/ML Solostar Pen Inject 30 Units into the skin daily at 10 pm. (Patient taking differently: Inject 35 Units into the  skin daily at 10 pm. ) 30 mL 0  . losartan (COZAAR) 50 MG tablet Take 1 tablet (50 mg total) by mouth daily. 90 tablet 0  . naproxen sodium (ANAPROX) 220 MG tablet Take 220 mg by mouth as needed.     No current facility-administered medications for this visit.    No Known Allergies  Family History  Problem Relation Age of Onset  . Arthritis Mother   . Cancer Mother     Ovarian  . Diabetes Mother   . Heart disease Father   . Hypertension Father     History   Social History  . Marital Status: Married    Spouse Name: N/A  . Number of Children: N/A  . Years of Education: N/A   Occupational History  . Not on file.   Social History Main Topics  . Smoking status: Never Smoker   . Smokeless tobacco: Never Used  . Alcohol Use: No  . Drug Use: No  . Sexual Activity: Not Currently   Other Topics Concern  . Not on file   Social History Narrative     Constitutional: Pt reports fatigue, weight gain. Denies fever, malaise, headache.  HEENT: Denies eye pain, eye redness, ear pain, ringing in the ears, wax buildup, runny nose, nasal  congestion, bloody nose, or sore throat. Respiratory: Denies difficulty breathing, shortness of breath, cough or sputum production.   Cardiovascular: Pt reports swelling in her feet. Denies chest pain, chest tightness, palpitations or swelling in the hands.  Gastrointestinal: Pt reports decreased appetite and constipation. Denies abdominal pain, bloating, diarrhea or blood in the stool.  Musculoskeletal: Pt reports back pain. Denies difficulty with gait, muscle pain or joint pain and swelling.  Skin: Denies redness, rashes, lesions or ulcercations.  Neurological: Denies dizziness, difficulty with memory, difficulty with speech or problems with balance and coordination.  Psych: Pt reports mild depression. Denies anxiety, SI/HI.  No other specific complaints in a complete review of systems (except as listed in HPI above).  Objective:   Physical  Exam   BP 146/72 mmHg  Pulse 77  Temp(Src) 98.1 F (36.7 C) (Oral)  Wt 285 lb (129.275 kg)  SpO2 98% Wt Readings from Last 3 Encounters:  08/01/14 285 lb (129.275 kg)  06/06/14 278 lb (126.1 kg)  05/30/14 278 lb (126.1 kg)    General: Appears her stated age, obese in NAD. Skin: Warm, dry and intact. No rashes, lesions or ulcerations noted. HEENT: Head: normal shape and size; Eyes: sclera white, no icterus, conjunctiva pink, PERRLA and EOMs intact;  Neck:  Neck supple, trachea midline. No masses, lumps or thyromegaly present.  Cardiovascular: Normal rate and rhythm. S1,S2 noted.  No murmur, rubs or gallops noted. 1+ BLE edema. No carotid bruits noted. Pulmonary/Chest: Normal effort and positive vesicular breath sounds. No respiratory distress. No wheezes, rales or ronchi noted.  Musculoskeletal: Decreased flexion, extension of the back. Pain with palpation of her lumbar spine. Strength 5/5 BLE. No difficulty with gait. Neurological: Alert and oriented.  Psychiatric: Mood slightly flat. She does engage but avoid eye contact.    BMET    Component Value Date/Time   NA 137 05/06/2014 1448   K 4.1 05/06/2014 1448   CL 102 05/06/2014 1448   CO2 30 05/06/2014 1448   GLUCOSE 264* 05/06/2014 1448   BUN 19 05/06/2014 1448   CREATININE 0.99 05/06/2014 1448   CALCIUM 8.8 05/06/2014 1448    Lipid Panel     Component Value Date/Time   CHOL 120 05/06/2014 1448   TRIG 155.0* 05/06/2014 1448   HDL 38.10* 05/06/2014 1448   CHOLHDL 3 05/06/2014 1448   VLDL 31.0 05/06/2014 1448   LDLCALC 51 05/06/2014 1448    CBC    Component Value Date/Time   WBC 7.8 05/06/2014 1448   RBC 4.34 05/06/2014 1448   HGB 11.0* 05/06/2014 1448   HCT 33.6* 05/06/2014 1448   PLT 291.0 05/06/2014 1448   MCV 77.5* 05/06/2014 1448   MCHC 32.8 05/06/2014 1448   RDW 16.7* 05/06/2014 1448    Hgb A1C Lab Results  Component Value Date   HGBA1C 9.7* 05/06/2014        Assessment & Plan:

## 2014-08-01 NOTE — Assessment & Plan Note (Signed)
Nonadherent with diet and exercise Will check A1C today No microalbumin due to being on ARB Eye exam UTD She decline Pneumovax and Prevnar

## 2014-08-01 NOTE — Assessment & Plan Note (Addendum)
Encouraged her to consume a low fat diet LDL reviewed- no need to repeat today Will continue Lipitor

## 2014-08-03 NOTE — Addendum Note (Signed)
Addended by: Roena MaladyEVONTENNO, Zaide Mcclenahan Y on: 08/03/2014 09:32 AM   Modules accepted: Orders, Medications

## 2014-08-08 NOTE — Patient Outreach (Signed)
[facility]) Care Management  08/08/2014  Bethany [name] 1945/02/22 [id number]   Telephone outreach to patient to get an update on the extra help application from social security. Patient states that she has not received any correspondence from social security. Once she does, she will give me a call. I will follow up in 2 weeks.  Susann Lawhorne L. Tyriana Helmkamp, AAS [facility] Care Management Assistant

## 2014-08-09 ENCOUNTER — Other Ambulatory Visit: Payer: Self-pay | Admitting: *Deleted

## 2014-08-09 VITALS — BP 140/80 | HR 74 | Resp 20

## 2014-08-09 DIAGNOSIS — IMO0002 Reserved for concepts with insufficient information to code with codable children: Secondary | ICD-10-CM

## 2014-08-09 DIAGNOSIS — E1165 Type 2 diabetes mellitus with hyperglycemia: Secondary | ICD-10-CM

## 2014-08-09 NOTE — Patient Outreach (Signed)
[place] [facility]) Care Management   08/09/2014  Bethany [name] 1945/12/23 [id number]  Bethany [name] is an 69 y.o. female  Subjective: Pt reports saw MD Webb Silversmith FNP) last week, A1C is down, insulin was raised, blood sugars down (170-190).  Pt c/o lower back pain (+6-7 on pain scale) today, thinking of having back surgery.  Pt states MD said to f/u with orthopedic MD to which she said she is going to go by their office today, set up appointment with orthopedic surgeon.   Pt states she went to the [facility], tried the water aerobics, need to go slow as overdid it.  Pt states MD discussed if she has ever been to an Endocrinologist to which pt states plans  to call MD office for a referral.  Pt states her daughter is going to bring her BP machine to local fire department, have it checked as machine is new.     Objective:   Filed Vitals:   08/09/14 1018  BP: 140/80  Pulse: 74  Resp: 20    ROS  Physical Exam  Constitutional: She is oriented to person, place, and time. She appears well-developed and well-nourished.  Cardiovascular: Normal rate.   Respiratory: Breath sounds normal.  GI: Soft.  Musculoskeletal: She exhibits edema.  Neurological: She is alert and oriented to person, place, and time.  Psychiatric: She has a normal mood and affect. Her behavior is normal. Judgment and thought content normal.  Edema- trace in right ankle, +1 top of left foot/ankle   Current Medications:  Reviewed medications with pt.  Current Outpatient Prescriptions  Medication Sig Dispense Refill  . amLODipine (NORVASC) 10 MG tablet TAKE 1 TABLET BY MOUTH DAILY 30 tablet 2  . aspirin 81 MG tablet Take 81 mg by mouth daily.    Marland Kitchen atorvastatin (LIPITOR) 20 MG tablet TAKE 1 TABLET BY MOUTH DAILY 90 tablet 0  . furosemide (LASIX) 20 MG tablet Take 20 mg by mouth daily as needed.    Marland Kitchen glimepiride (AMARYL) 4 MG tablet Take 1 tablet (4 mg total) by mouth daily with breakfast. 90 tablet 0  . insulin  glargine (LANTUS) 100 UNIT/ML injection Inject 40 Units into the skin at bedtime.    Marland Kitchen losartan (COZAAR) 50 MG tablet Take 1 tablet (50 mg total) by mouth daily. 90 tablet 0  . naproxen sodium (ANAPROX) 220 MG tablet Take 220 mg by mouth as needed.     No current facility-administered medications for this visit.    Functional Status:   In your present state of health, do you have any difficulty performing the following activities: 05/06/2014 04/06/2014  Hearing? N N  Vision? N N  Difficulty concentrating or making decisions? N N  Walking or climbing stairs? N Y  Dressing or bathing? N N  Doing errands, shopping? N N  Preparing Food and eating ? N N  Using the Toilet? N N  In the past six months, have you accidently leaked urine? N N  Do you have problems with loss of bowel control? N N  Managing your Medications? N N  Managing your Finances? N N  Housekeeping or managing your Housekeeping? N N    Fall/Depression Screening:    PHQ 2/9 Scores 08/01/2014 04/06/2014 08/02/2013  PHQ - 2 Score 0 0 0    Assessment:  DM-  View in Epic shows most recent A1C 9.0, down from 9.7 three months ago.  Insulin (Lantus)  Increased from  35 units to 40 units (last MD visit 7/18).   Pt reported sugars range 170-190, did not                                   Glucometer available to view readings, averages.                                   Back pain:  Pt reports ongoing  +6-7 today on pain scale.                                    HTN- BP today 140/80 (before am meds).    Plan:  Pt to f/u with Webb Silversmith FNP, request referral for endocrinologist             Pt to f/u with Orthopedic MD, set up appointment for back surgery             Pt to continue to f/u at [facility], slowly work up in water aerobics.            As discussed, pt to prepare meals/snacks from home to take with her when out, avoid skipping meals.             RN CM to discharge pt from RN CM services, most  goals met.            RN CM to inform Webb Silversmith of discharge from RN CM services via fax in [place] as well as send letter.             Plan to inform [facility] CMA of discharge from RN CM services, close case.            [facility] CM Care Plan Problem One        Patient Outreach from 08/09/2014 in [place] for Problem One  Active   THN Long Term Goal (31-90 days)  A1c will be less than 9 in the next 60 days    THN Long Term Goal Start Date  06/06/14   [facility]N Long Term Goal Met Date  -- [not met, current A1C 9]   Interventions for Problem One Long Term Goal  Pt to monitor blood sugars daily/continue to monitor carb intake, portion sizes    [facility]N CM Short Term Goal #4 Met Date  08/09/14    [facility] CM Care Plan Problem Two        Patient Outreach from 06/06/2014 in [place] Problem Two  Elevated BP    Care Plan for Problem Two  Active   THN CM Short Term Goal #1 (0-30 days)  Pt to use other BP machine/record in the next 30 days    THN CM Short Term Goal #1 Start Date  05/06/14   [facility] CM Short Term Goal #1 Met Date   06/06/14 [most recent MD visit 138/76, today 142/80 (before meds)]   Interventions for Short Term Goal #2   Discussed with pt checking BP often/record/call MD if elevated.      [name]   [facility] Care Management  [phone]

## 2014-08-11 ENCOUNTER — Encounter: Payer: Self-pay | Admitting: *Deleted

## 2014-08-11 NOTE — Patient Outreach (Signed)
[place] [facility]) Care Management  08/11/2014  Bethany [name] 1945/04/12 [id number]   Documentation encounter:  With pharmacy still involved- [place] assisting pt with extra help application, RN CM will  not close case.   Pt was discharged from RN CM services 7/26, goals met.    Plan to send  letter to Marshall Cork NP, informing her of discharging pt from RN CM services, pharmacy still  involved.   [name]   [place] Care Management  [phone]

## 2014-08-22 NOTE — Patient Outreach (Signed)
[facility]) Care Management  08/22/2014  CLARABELL MATSUOKA [birth year] [id number]   Telephone outreach to patient to check status of extra help application. Unsuccessful attempt and I had to leave a HIPPA compliant message for her to call me back.  Sinclair Arrazola L. Hillery Zachman, AAS [facility] Care Management Assistant

## 2014-08-23 ENCOUNTER — Other Ambulatory Visit: Payer: Self-pay | Admitting: Neurological Surgery

## 2014-08-23 DIAGNOSIS — M4316 Spondylolisthesis, lumbar region: Secondary | ICD-10-CM | POA: Diagnosis not present

## 2014-08-31 ENCOUNTER — Other Ambulatory Visit: Payer: Self-pay

## 2014-08-31 NOTE — Telephone Encounter (Signed)
I do not see where you have ever filled this medication--please advise if okay to refill 

## 2014-09-01 MED ORDER — FUROSEMIDE 20 MG PO TABS: 20.0000 mg | ORAL_TABLET | Freq: Every day | ORAL | Status: DC | PRN

## 2014-09-06 ENCOUNTER — Encounter (HOSPITAL_COMMUNITY)
Admission: RE | Admit: 2014-09-06 | Discharge: 2014-09-06 | Disposition: A | Discharge status: Home / Self Care | Payer: Medicare Other | Source: Ambulatory Visit | Attending: Neurological Surgery | Admitting: Neurological Surgery

## 2014-09-06 ENCOUNTER — Encounter (HOSPITAL_COMMUNITY): Payer: Self-pay

## 2014-09-06 ENCOUNTER — Ambulatory Visit (HOSPITAL_COMMUNITY)
Admission: RE | Admit: 2014-09-06 | Discharge: 2014-09-06 | Disposition: A | Discharge status: Home / Self Care | Payer: Medicare Other | Source: Ambulatory Visit | Attending: Neurological Surgery | Admitting: Neurological Surgery

## 2014-09-06 DIAGNOSIS — M4310 Spondylolisthesis, site unspecified: Secondary | ICD-10-CM

## 2014-09-06 DIAGNOSIS — Z01812 Encounter for preprocedural laboratory examination: Secondary | ICD-10-CM | POA: Insufficient documentation

## 2014-09-06 DIAGNOSIS — Z01818 Encounter for other preprocedural examination: Secondary | ICD-10-CM | POA: Insufficient documentation

## 2014-09-06 DIAGNOSIS — Z01810 Encounter for preprocedural cardiovascular examination: Secondary | ICD-10-CM | POA: Diagnosis not present

## 2014-09-06 DIAGNOSIS — Z0183 Encounter for blood typing: Secondary | ICD-10-CM | POA: Diagnosis not present

## 2014-09-06 HISTORY — DX: Fibromyalgia: M79.7

## 2014-09-06 HISTORY — DX: Gastro-esophageal reflux disease without esophagitis: K21.9

## 2014-09-06 LAB — CBC WITH DIFFERENTIAL/PLATELET
Basophils Absolute: 0 10*3/uL (ref 0.0–0.1)
Basophils Relative: 0 % (ref 0–1)
Eosinophils Absolute: 0.3 10*3/uL (ref 0.0–0.7)
Eosinophils Relative: 4 % (ref 0–5)
HCT: 36.5 % (ref 36.0–46.0)
Hemoglobin: 11.8 g/dL — ABNORMAL LOW (ref 12.0–15.0)
Lymphocytes Relative: 31 % (ref 12–46)
Lymphs Abs: 2.2 10*3/uL (ref 0.7–4.0)
MCH: 26.2 pg (ref 26.0–34.0)
MCHC: 32.3 g/dL (ref 30.0–36.0)
MCV: 80.9 fL (ref 78.0–100.0)
Monocytes Absolute: 0.5 10*3/uL (ref 0.1–1.0)
Monocytes Relative: 7 % (ref 3–12)
Neutro Abs: 4.1 10*3/uL (ref 1.7–7.7)
Neutrophils Relative %: 58 % (ref 43–77)
Platelets: 352 10*3/uL (ref 150–400)
RBC: 4.51 MIL/uL (ref 3.87–5.11)
RDW: 15.7 % — ABNORMAL HIGH (ref 11.5–15.5)
WBC: 7.1 10*3/uL (ref 4.0–10.5)

## 2014-09-06 LAB — TYPE AND SCREEN
ABO/RH(D): A POS
Antibody Screen: NEGATIVE

## 2014-09-06 LAB — BASIC METABOLIC PANEL
Anion gap: 8 (ref 5–15)
BUN: 13 mg/dL (ref 6–20)
CO2: 26 mmol/L (ref 22–32)
Calcium: 8.8 mg/dL — ABNORMAL LOW (ref 8.9–10.3)
Chloride: 106 mmol/L (ref 101–111)
Creatinine, Ser: 1.05 mg/dL — ABNORMAL HIGH (ref 0.44–1.00)
GFR calc Af Amer: >60 mL/min (ref >60)
GFR calc non Af Amer: 52 mL/min — ABNORMAL LOW (ref >60)
Glucose, Bld: 216 mg/dL — ABNORMAL HIGH (ref 65–99)
Potassium: 3.6 mmol/L (ref 3.5–5.1)
Sodium: 140 mmol/L (ref 135–145)

## 2014-09-06 LAB — ABO/RH: ABO/RH(D): A POS

## 2014-09-06 LAB — PROTIME-INR
INR: 1.14 (ref 0.00–1.49)
Prothrombin Time: 14.8 seconds (ref 11.6–15.2)

## 2014-09-06 LAB — SURGICAL PCR SCREEN
MRSA, PCR: NEGATIVE
Staphylococcus aureus: NEGATIVE

## 2014-09-06 LAB — GLUCOSE, CAPILLARY: Glucose-Capillary: 177 mg/dL — ABNORMAL HIGH (ref 65–99)

## 2014-09-06 NOTE — Progress Notes (Signed)
PCP is Lorre Munroe, NP Denies seeing a cardiologist Denies ever having a card cath. Reports she had a stress test and echo many years ago. Pt reports she is not seeing an endocrinologist Reports her blood sugars run 70-180's and on average 150's

## 2014-09-06 NOTE — Pre-Procedure Instructions (Signed)
Bethany [name]  09/06/2014      [facility] - [place], [place] - [id number] [place] [address] Phone: [phone] Fax: [phone]    Your procedure is scheduled on Sept 2  Report to [facility] Admitting at 530 A.M.  Call this number if you have problems the morning of surgery:  [phone]   Remember:  Do not eat food or drink liquids after midnight.  Take these medicines the morning of surgery with A SIP OF WATER: amlodipine (Norvasc)  Stop taking Aspirin, Ibuprofen, BC's, Goody's, Herbal medications, Fish Oil, naproxen, Aleve   Do not wear jewelry, make-up or nail polish.  Do not wear lotions, powders, or perfumes.  You may wear deodorant.  Do not shave 48 hours prior to surgery.  Men may shave face and neck.  Do not bring valuables to the hospital.  [facility] is not responsible for any belongings or valuables.  Contacts, dentures or bridgework may not be worn into surgery.  Leave your suitcase in the car.  After surgery it may be brought to your room.  For patients admitted to the hospital, discharge time will be determined by your treatment team.  Patients discharged the day of surgery will not be allowed to drive home.    Special instructions: [place] - Preparing for Surgery  Before surgery, you can play an important role.  Because skin is not sterile, your skin needs to be as free of germs as possible.  You can reduce the number of germs on you skin by washing with CHG (chlorahexidine gluconate) soap before surgery.  CHG is an antiseptic cleaner which kills germs and bonds with the skin to continue killing germs even after washing.  Please DO NOT use if you have an allergy to CHG or antibacterial soaps.  If your skin becomes reddened/irritated stop using the CHG and inform your nurse when you arrive at Short Stay.  Do not shave (including legs and underarms) for at least 48 hours prior to the  first CHG shower.  You may shave your face.  Please follow these instructions carefully:   1.  Shower with CHG Soap the night before surgery and the   morning of Surgery.  2.  If you choose to wash your hair, wash your hair first as usual with your normal shampoo.  3.  After you shampoo, rinse your hair and body thoroughly to remove the    Shampoo.  4.  Use CHG as you would any other liquid soap.  You can apply chg directly  to the skin and wash gently with scrungie or a clean washcloth.  5.  Apply the CHG Soap to your body ONLY FROM THE NECK DOWN.  Do not use on open wounds or open sores.  Avoid contact with your eyes,   ears, mouth and genitals (private parts).  Wash genitals (private parts)   with your normal soap.  6.  Wash thoroughly, paying special attention to the area where your surgery  will be performed.  7.  Thoroughly rinse your body with warm water from the neck down.  8.  DO NOT shower/wash with your normal soap after using and rinsing off  the CHG Soap.  9.  Pat yourself dry with a clean towel.            10.  Wear clean pajamas.  11.  Place clean sheets on your bed the night of your first shower and do not sleep with pets.  Day of Surgery  Do not apply any lotions/deoderants the morning of surgery.  Please wear clean clothes to the hospital/surgery center.     Please read over the following fact sheets that you were given. Pain Booklet, Coughing and Deep Breathing, Blood Transfusion Information, MRSA Information and Surgical Site Infection Prevention   How to Manage Your Diabetes Before Surgery   Why is it important to control my blood sugar before and after surgery?   Improving blood sugar levels before and after surgery helps healing and can limit problems.  A way of improving blood sugar control is eating a healthy diet by:  - Eating less sugar and carbohydrates  - Increasing activity/exercise  - Talk with your doctor about reaching your blood sugar  goals  High blood sugars (greater than 180 mg/dL) can raise your risk of infections and slow down your recovery so you will need to focus on controlling your diabetes during the weeks before surgery.  Make sure that the doctor who takes care of your diabetes knows about your planned surgery including the date and location.  How do I manage my blood sugars before surgery?   Check your blood sugar at least 4 times a day, 2 days before surgery to make sure that they are not too high or low.   Check your blood sugar the morning of your surgery when you wake up and every 2               hours until you get to the Short-Stay unit.  If your blood sugar is less than 70 mg/dL, you will need to treat for low blood sugar by:  Treat a low blood sugar (less than 70 mg/dL) with 1/2 cup of clear juice (cranberry or apple), 4 glucose tablets, OR glucose gel.  Recheck blood sugar in 15 minutes after treatment (to make sure it is greater than 70 mg/dL).  If blood sugar is not greater than 70 mg/dL on re-check, call [phone] for further instructions.   Report your blood sugar to the Short-Stay nurse when you get to Short-Stay.  References:  [facility], 2007 "How to Manage your Diabetes Before and After Surgery".  What do I do about my diabetes medications?   Do not take oral diabetes medicines (pills) the morning of surgery.  THE NIGHT BEFORE SURGERY, take 32 units of Lantus Insulin.    Do not take other diabetes injectables the day of surgery including Byetta, Victoza, Bydureon, and Trulicity.    If your CBG is greater than 220 mg/dL, you may take 1/2 of your sliding scale (correction) dose of insulin.   For patients with "Insulin Pumps":  Contact your diabetes doctor for specific instructions before surgery.   Decrease basal insulin rates by 20% at midnight the night before surgery.  Note that if your surgery is planned to be longer than 2 hours, your  insulin pump will be removed and intravenous (IV) insulin will be started and managed by the nurses and anesthesiologist.  You will be able to restart your insulin pump once you are awake and able to manage it.  Make sure to bring insulin pump supplies to the hospital with you in case your site needs to be changed.

## 2014-09-08 NOTE — Progress Notes (Signed)
Anesthesia Chart Review:  Pt is 69 year old female scheduled for L4-5 maximum access PLIF on 09/16/2014 with Dr. Yetta Barre.   PMH includes: HTN, DM, hyperlipidemia, GERD. Never smoker. BMI 45.   Medications include: amlodipine, ASA, lipitor, lasix, glimepiride, lantus, losartan.  Preoperative labs reviewed.  Glucose 216. Last hgbA1c was 9 on 08/01/14.   Chest x-ray 09/06/2014 reviewed. No active disease.   EKG 09/06/2014: NSR.   If no changes, I anticipate pt can proceed with surgery as scheduled.   Rica Mast, FNP-BC [facility] Stay [facility]/Anesthesiology Phone: [phone] 09/08/2014 2:00 PM

## 2014-09-15 MED ORDER — DEXTROSE 5 % IV SOLN
3.0000 g | INTRAVENOUS | Status: AC
  Administered 2014-09-16: 3 g via INTRAVENOUS
  Filled 2014-09-15: qty 3000

## 2014-09-15 MED ORDER — DEXAMETHASONE SODIUM PHOSPHATE 10 MG/ML IJ SOLN
10.0000 mg | INTRAMUSCULAR | Status: AC
  Administered 2014-09-16: 10 mg via INTRAVENOUS

## 2014-09-15 NOTE — Anesthesia Preprocedure Evaluation (Addendum)
Anesthesia Evaluation  Patient identified by MRN, date of birth, ID band Patient awake    Reviewed: Allergy & Precautions, NPO status , Patient's Chart, lab work & pertinent test results  Airway Mallampati: II   Neck ROM: Full    Dental  (+) Dental Advisory Given, Teeth Intact   Pulmonary neg pulmonary ROS,  breath sounds clear to auscultation        Cardiovascular hypertension, Pt. on medications Rhythm:Regular  EKG 08/2014 WNL   Neuro/Psych    GI/Hepatic GERD-  Medicated,  Endo/Other  diabetes, Poorly Controlled, Type 2, Insulin DependentMorbid obesityBMI 45  Renal/GU      Musculoskeletal   Abdominal (+) + obese,   Peds  Hematology 12/36   Anesthesia Other Findings   Reproductive/Obstetrics                            Anesthesia Physical Anesthesia Plan  ASA: III  Anesthesia Plan: General   Post-op Pain Management:    Induction: Intravenous  Airway Management Planned: Oral ETT and Video Laryngoscope Planned  Additional Equipment:   Intra-op Plan:   Post-operative Plan: Extubation in OR  Informed Consent: I have reviewed the patients History and Physical, chart, labs and discussed the procedure including the risks, benefits and alternatives for the proposed anesthesia with the patient or authorized representative who has indicated his/her understanding and acceptance.     Plan Discussed with:   Anesthesia Plan Comments: (2nd IV be nice, multimodal pain RX, verify T & S, Glide available)        Anesthesia Quick Evaluation

## 2014-09-16 ENCOUNTER — Ambulatory Visit (HOSPITAL_COMMUNITY): Payer: Medicare Other

## 2014-09-16 ENCOUNTER — Encounter (HOSPITAL_COMMUNITY): Payer: Self-pay | Admitting: *Deleted

## 2014-09-16 ENCOUNTER — Ambulatory Visit (HOSPITAL_COMMUNITY)
Admission: RE | Admit: 2014-09-16 | Discharge: 2014-09-18 | Disposition: A | Discharge status: Home / Self Care | Payer: Medicare Other | Source: Ambulatory Visit | Attending: Neurological Surgery | Admitting: Neurological Surgery

## 2014-09-16 ENCOUNTER — Inpatient Hospital Stay (HOSPITAL_COMMUNITY): Payer: Medicare Other | Admitting: Anesthesiology

## 2014-09-16 ENCOUNTER — Inpatient Hospital Stay (HOSPITAL_COMMUNITY): Payer: Medicare Other | Admitting: Vascular Surgery

## 2014-09-16 ENCOUNTER — Encounter (HOSPITAL_COMMUNITY)
Admission: RE | Disposition: A | Discharge status: Home / Self Care | Payer: Self-pay | Source: Ambulatory Visit | Attending: Neurological Surgery

## 2014-09-16 DIAGNOSIS — K219 Gastro-esophageal reflux disease without esophagitis: Secondary | ICD-10-CM | POA: Diagnosis not present

## 2014-09-16 DIAGNOSIS — E119 Type 2 diabetes mellitus without complications: Secondary | ICD-10-CM | POA: Diagnosis not present

## 2014-09-16 DIAGNOSIS — I10 Essential (primary) hypertension: Secondary | ICD-10-CM | POA: Diagnosis not present

## 2014-09-16 DIAGNOSIS — E785 Hyperlipidemia, unspecified: Secondary | ICD-10-CM | POA: Diagnosis not present

## 2014-09-16 DIAGNOSIS — M4326 Fusion of spine, lumbar region: Secondary | ICD-10-CM

## 2014-09-16 DIAGNOSIS — M4316 Spondylolisthesis, lumbar region: Principal | ICD-10-CM | POA: Insufficient documentation

## 2014-09-16 DIAGNOSIS — Z7982 Long term (current) use of aspirin: Secondary | ICD-10-CM | POA: Insufficient documentation

## 2014-09-16 DIAGNOSIS — Z794 Long term (current) use of insulin: Secondary | ICD-10-CM | POA: Insufficient documentation

## 2014-09-16 DIAGNOSIS — Z981 Arthrodesis status: Secondary | ICD-10-CM | POA: Insufficient documentation

## 2014-09-16 DIAGNOSIS — M1990 Unspecified osteoarthritis, unspecified site: Secondary | ICD-10-CM | POA: Diagnosis not present

## 2014-09-16 DIAGNOSIS — M797 Fibromyalgia: Secondary | ICD-10-CM | POA: Diagnosis not present

## 2014-09-16 DIAGNOSIS — M4806 Spinal stenosis, lumbar region: Secondary | ICD-10-CM | POA: Diagnosis not present

## 2014-09-16 HISTORY — PX: MAXIMUM ACCESS (MAS)POSTERIOR LUMBAR INTERBODY FUSION (PLIF) 1 LEVEL: SHX6368

## 2014-09-16 LAB — GLUCOSE, CAPILLARY: Glucose-Capillary: 170 mg/dL — ABNORMAL HIGH (ref 65–99)

## 2014-09-16 SURGERY — FOR MAXIMUM ACCESS (MAS) POSTERIOR LUMBAR INTERBODY FUSION (PLIF) 1 LEVEL
Anesthesia: General | Site: Back

## 2014-09-16 MED ORDER — OXYCODONE-ACETAMINOPHEN 5-325 MG PO TABS
1.0000 | ORAL_TABLET | ORAL | Status: DC | PRN
  Administered 2014-09-16 (×2): 2 via ORAL
  Administered 2014-09-17: 1 via ORAL
  Administered 2014-09-17: 2 via ORAL
  Administered 2014-09-17: 1 via ORAL
  Administered 2014-09-18: 2 via ORAL
  Filled 2014-09-16 (×3): qty 2
  Filled 2014-09-16: qty 1
  Filled 2014-09-16 (×2): qty 2

## 2014-09-16 MED ORDER — SUFENTANIL CITRATE 50 MCG/ML IV SOLN
INTRAVENOUS | Status: DC | PRN
  Administered 2014-09-16: 20 ug via INTRAVENOUS
  Administered 2014-09-16 (×2): 5 ug via INTRAVENOUS
  Administered 2014-09-16: 10 ug via INTRAVENOUS

## 2014-09-16 MED ORDER — ACETAMINOPHEN 650 MG RE SUPP: 650.0000 mg | RECTAL | Status: DC | PRN

## 2014-09-16 MED ORDER — POTASSIUM CHLORIDE IN NACL 20-0.9 MEQ/L-% IV SOLN
INTRAVENOUS | Status: DC
  Administered 2014-09-16: 16:00:00 via INTRAVENOUS
  Filled 2014-09-16 (×5): qty 1000

## 2014-09-16 MED ORDER — MEPERIDINE HCL 25 MG/ML IJ SOLN: 6.2500 mg | INTRAMUSCULAR | Status: DC | PRN

## 2014-09-16 MED ORDER — MIDAZOLAM HCL 2 MG/2ML IJ SOLN
INTRAMUSCULAR | Status: AC
  Filled 2014-09-16: qty 4

## 2014-09-16 MED ORDER — SUFENTANIL CITRATE 50 MCG/ML IV SOLN
INTRAVENOUS | Status: AC
Start: 2014-09-16 — End: 2014-09-16
  Filled 2014-09-16: qty 1

## 2014-09-16 MED ORDER — MORPHINE SULFATE (PF) 2 MG/ML IV SOLN: 1.0000 mg | INTRAVENOUS | Status: DC | PRN

## 2014-09-16 MED ORDER — PROPOFOL 10 MG/ML IV BOLUS
INTRAVENOUS | Status: DC | PRN
  Administered 2014-09-16: 50 mg via INTRAVENOUS
  Administered 2014-09-16: 200 mg via INTRAVENOUS
  Administered 2014-09-16: 40 mg via INTRAVENOUS

## 2014-09-16 MED ORDER — FUROSEMIDE 20 MG PO TABS
20.0000 mg | ORAL_TABLET | Freq: Every day | ORAL | Status: DC
  Administered 2014-09-16 – 2014-09-18 (×3): 20 mg via ORAL
  Filled 2014-09-16 (×3): qty 1

## 2014-09-16 MED ORDER — 0.9 % SODIUM CHLORIDE (POUR BTL) OPTIME
TOPICAL | Status: DC | PRN
  Administered 2014-09-16: 1000 mL

## 2014-09-16 MED ORDER — SODIUM CHLORIDE 0.9 % IJ SOLN
3.0000 mL | Freq: Two times a day (BID) | INTRAMUSCULAR | Status: DC
  Administered 2014-09-17 – 2014-09-18 (×2): 3 mL via INTRAVENOUS

## 2014-09-16 MED ORDER — HYDROMORPHONE HCL 1 MG/ML IJ SOLN
INTRAMUSCULAR | Status: AC
  Filled 2014-09-16: qty 1

## 2014-09-16 MED ORDER — PHENYLEPHRINE 40 MCG/ML (10ML) SYRINGE FOR IV PUSH (FOR BLOOD PRESSURE SUPPORT)
PREFILLED_SYRINGE | INTRAVENOUS | Status: AC
  Filled 2014-09-16: qty 30

## 2014-09-16 MED ORDER — HYDROMORPHONE HCL 1 MG/ML IJ SOLN
0.2500 mg | INTRAMUSCULAR | Status: DC | PRN
  Administered 2014-09-16 (×4): 0.5 mg via INTRAVENOUS

## 2014-09-16 MED ORDER — DEXTROSE 5 % IV SOLN
3.0000 g | Freq: Three times a day (TID) | INTRAVENOUS | Status: AC
  Administered 2014-09-16 (×2): 3 g via INTRAVENOUS
  Filled 2014-09-16 (×3): qty 3000

## 2014-09-16 MED ORDER — LACTATED RINGERS IV SOLN
INTRAVENOUS | Status: DC | PRN
  Administered 2014-09-16 (×2): via INTRAVENOUS

## 2014-09-16 MED ORDER — SUCCINYLCHOLINE CHLORIDE 20 MG/ML IJ SOLN
INTRAMUSCULAR | Status: DC | PRN
  Administered 2014-09-16: 120 mg via INTRAVENOUS

## 2014-09-16 MED ORDER — LOSARTAN POTASSIUM 50 MG PO TABS
50.0000 mg | ORAL_TABLET | Freq: Every day | ORAL | Status: DC
  Administered 2014-09-16 – 2014-09-18 (×3): 50 mg via ORAL
  Filled 2014-09-16 (×3): qty 1

## 2014-09-16 MED ORDER — DEXTROSE 5 % IV SOLN: 500.0000 mg | Freq: Four times a day (QID) | INTRAVENOUS | Status: DC | PRN

## 2014-09-16 MED ORDER — AMLODIPINE BESYLATE 10 MG PO TABS
10.0000 mg | ORAL_TABLET | Freq: Every day | ORAL | Status: DC
  Administered 2014-09-16 – 2014-09-18 (×3): 10 mg via ORAL
  Filled 2014-09-16 (×3): qty 1

## 2014-09-16 MED ORDER — SENNA 8.6 MG PO TABS
1.0000 | ORAL_TABLET | Freq: Two times a day (BID) | ORAL | Status: DC
  Administered 2014-09-16 – 2014-09-18 (×4): 8.6 mg via ORAL
  Filled 2014-09-16 (×4): qty 1

## 2014-09-16 MED ORDER — ONDANSETRON HCL 4 MG/2ML IJ SOLN: 4.0000 mg | INTRAMUSCULAR | Status: DC | PRN

## 2014-09-16 MED ORDER — SODIUM CHLORIDE 0.9 % IJ SOLN
INTRAMUSCULAR | Status: AC
  Filled 2014-09-16: qty 10

## 2014-09-16 MED ORDER — ONDANSETRON HCL 4 MG/2ML IJ SOLN
INTRAMUSCULAR | Status: AC
  Filled 2014-09-16: qty 2

## 2014-09-16 MED ORDER — ACETAMINOPHEN 325 MG PO TABS
650.0000 mg | ORAL_TABLET | ORAL | Status: DC | PRN
  Administered 2014-09-18: 650 mg via ORAL
  Filled 2014-09-16 (×2): qty 2

## 2014-09-16 MED ORDER — MENTHOL 3 MG MT LOZG: 1.0000 | LOZENGE | OROMUCOSAL | Status: DC | PRN

## 2014-09-16 MED ORDER — ONDANSETRON HCL 4 MG/2ML IJ SOLN
INTRAMUSCULAR | Status: DC | PRN
  Administered 2014-09-16: 4 mg via INTRAVENOUS

## 2014-09-16 MED ORDER — DEXAMETHASONE SODIUM PHOSPHATE 10 MG/ML IJ SOLN
INTRAMUSCULAR | Status: AC
  Filled 2014-09-16: qty 1

## 2014-09-16 MED ORDER — SUCCINYLCHOLINE CHLORIDE 20 MG/ML IJ SOLN
INTRAMUSCULAR | Status: AC
  Filled 2014-09-16: qty 1

## 2014-09-16 MED ORDER — ASPIRIN 81 MG PO CHEW
81.0000 mg | CHEWABLE_TABLET | Freq: Every day | ORAL | Status: DC
  Administered 2014-09-16 – 2014-09-18 (×3): 81 mg via ORAL
  Filled 2014-09-16 (×3): qty 1

## 2014-09-16 MED ORDER — THROMBIN 5000 UNITS EX SOLR
OROMUCOSAL | Status: DC | PRN
  Administered 2014-09-16: 10 mL via TOPICAL

## 2014-09-16 MED ORDER — CELECOXIB 200 MG PO CAPS
200.0000 mg | ORAL_CAPSULE | Freq: Two times a day (BID) | ORAL | Status: DC
  Administered 2014-09-16 – 2014-09-18 (×5): 200 mg via ORAL
  Filled 2014-09-16 (×5): qty 1

## 2014-09-16 MED ORDER — METHOCARBAMOL 500 MG PO TABS
500.0000 mg | ORAL_TABLET | Freq: Four times a day (QID) | ORAL | Status: DC | PRN
  Administered 2014-09-17 – 2014-09-18 (×3): 500 mg via ORAL
  Filled 2014-09-16 (×3): qty 1

## 2014-09-16 MED ORDER — PROPOFOL 10 MG/ML IV BOLUS
INTRAVENOUS | Status: AC
  Filled 2014-09-16: qty 20

## 2014-09-16 MED ORDER — THROMBIN 20000 UNITS EX SOLR
CUTANEOUS | Status: DC | PRN
  Administered 2014-09-16: 20 mL via TOPICAL

## 2014-09-16 MED ORDER — GLYCOPYRROLATE 0.2 MG/ML IJ SOLN
INTRAMUSCULAR | Status: AC
  Filled 2014-09-16: qty 1

## 2014-09-16 MED ORDER — MIDAZOLAM HCL 5 MG/5ML IJ SOLN
INTRAMUSCULAR | Status: DC | PRN
  Administered 2014-09-16: 2 mg via INTRAVENOUS

## 2014-09-16 MED ORDER — LIDOCAINE HCL (CARDIAC) 20 MG/ML IV SOLN
INTRAVENOUS | Status: AC
  Filled 2014-09-16: qty 5

## 2014-09-16 MED ORDER — LIDOCAINE HCL (CARDIAC) 20 MG/ML IV SOLN
INTRAVENOUS | Status: DC | PRN
  Administered 2014-09-16: 100 mg via INTRAVENOUS

## 2014-09-16 MED ORDER — GLIMEPIRIDE 4 MG PO TABS
4.0000 mg | ORAL_TABLET | Freq: Every day | ORAL | Status: DC
  Administered 2014-09-17 – 2014-09-18 (×2): 4 mg via ORAL
  Filled 2014-09-16 (×2): qty 1

## 2014-09-16 MED ORDER — SODIUM CHLORIDE 0.9 % IR SOLN
Status: DC | PRN
  Administered 2014-09-16: 500 mL

## 2014-09-16 MED ORDER — PHENOL 1.4 % MT LIQD: 1.0000 | OROMUCOSAL | Status: DC | PRN

## 2014-09-16 MED ORDER — PROMETHAZINE HCL 25 MG/ML IJ SOLN: 6.2500 mg | INTRAMUSCULAR | Status: DC | PRN

## 2014-09-16 MED ORDER — SODIUM CHLORIDE 0.9 % IJ SOLN: 3.0000 mL | INTRAMUSCULAR | Status: DC | PRN

## 2014-09-16 MED ORDER — SODIUM CHLORIDE 0.9 % IV SOLN
250.0000 mL | INTRAVENOUS | Status: DC
  Administered 2014-09-16: 250 mL via INTRAVENOUS

## 2014-09-16 MED ORDER — ACETAMINOPHEN 10 MG/ML IV SOLN
INTRAVENOUS | Status: AC
  Administered 2014-09-16: 1000 mg via INTRAVENOUS
  Filled 2014-09-16: qty 100

## 2014-09-16 MED ORDER — BUPIVACAINE HCL (PF) 0.25 % IJ SOLN
INTRAMUSCULAR | Status: DC | PRN
  Administered 2014-09-16: 4 mL

## 2014-09-16 MED ORDER — NEOSTIGMINE METHYLSULFATE 10 MG/10ML IV SOLN
INTRAVENOUS | Status: AC
  Filled 2014-09-16: qty 1

## 2014-09-16 SURGICAL SUPPLY — 65 items
BAG DECANTER FOR FLEXI CONT (MISCELLANEOUS) ×2 IMPLANT
BENZOIN TINCTURE PRP APPL 2/3 (GAUZE/BANDAGES/DRESSINGS) ×2 IMPLANT
BIT DRILL PLIF MAS 5.0MM DISP (DRILL) ×1 IMPLANT
BLADE CLIPPER SURG (BLADE) IMPLANT
BONE MATRIX OSTEOCEL PRO SM (Bone Implant) ×4 IMPLANT
BUR MATCHSTICK NEURO 3.0 LAGG (BURR) ×2 IMPLANT
CAGE COROENT 9X9X23-4 (Cage) ×4 IMPLANT
CANISTER SUCT 3000ML PPV (MISCELLANEOUS) ×2 IMPLANT
CLIP NEUROVISION LG (CLIP) ×2 IMPLANT
CONT SPEC 4OZ CLIKSEAL STRL BL (MISCELLANEOUS) ×2 IMPLANT
COVER BACK TABLE 24X17X13 BIG (DRAPES) IMPLANT
COVER BACK TABLE 60X90IN (DRAPES) ×2 IMPLANT
DERMABOND ADHESIVE PROPEN (GAUZE/BANDAGES/DRESSINGS) ×1
DERMABOND ADVANCED .7 DNX6 (GAUZE/BANDAGES/DRESSINGS) ×1 IMPLANT
DRAPE C-ARM 42X72 X-RAY (DRAPES) ×2 IMPLANT
DRAPE C-ARMOR (DRAPES) ×2 IMPLANT
DRAPE LAPAROTOMY 100X72X124 (DRAPES) ×2 IMPLANT
DRAPE POUCH INSTRU U-SHP 10X18 (DRAPES) ×2 IMPLANT
DRAPE SURG 17X23 STRL (DRAPES) ×2 IMPLANT
DRILL PLIF MAS 5.0MM DISP (DRILL) ×2
DRSG OPSITE POSTOP 4X6 (GAUZE/BANDAGES/DRESSINGS) ×2 IMPLANT
DURAPREP 26ML APPLICATOR (WOUND CARE) ×2 IMPLANT
ELECT BLADE 4.0 EZ CLEAN MEGAD (MISCELLANEOUS) ×2
ELECT REM PT RETURN 9FT ADLT (ELECTROSURGICAL) ×2
ELECTRODE BLDE 4.0 EZ CLN MEGD (MISCELLANEOUS) ×1 IMPLANT
ELECTRODE REM PT RTRN 9FT ADLT (ELECTROSURGICAL) ×1 IMPLANT
EVACUATOR 1/8 PVC DRAIN (DRAIN) ×2 IMPLANT
GAUZE SPONGE 4X4 16PLY XRAY LF (GAUZE/BANDAGES/DRESSINGS) IMPLANT
GLOVE BIO SURGEON STRL SZ8 (GLOVE) ×4 IMPLANT
GOWN STRL REUS W/ TWL LRG LVL3 (GOWN DISPOSABLE) IMPLANT
GOWN STRL REUS W/ TWL XL LVL3 (GOWN DISPOSABLE) ×2 IMPLANT
GOWN STRL REUS W/TWL 2XL LVL3 (GOWN DISPOSABLE) IMPLANT
GOWN STRL REUS W/TWL LRG LVL3 (GOWN DISPOSABLE)
GOWN STRL REUS W/TWL XL LVL3 (GOWN DISPOSABLE) ×2
HEMOSTAT POWDER KIT SURGIFOAM (HEMOSTASIS) IMPLANT
KIT BASIN OR (CUSTOM PROCEDURE TRAY) ×2 IMPLANT
KIT NEEDLE NVM5 EMG ELECT (KITS) ×1 IMPLANT
KIT NEEDLE NVM5 EMG ELECTRODE (KITS) ×1
KIT ROOM TURNOVER OR (KITS) ×2 IMPLANT
MILL MEDIUM DISP (BLADE) IMPLANT
NEEDLE HYPO 25X1 1.5 SAFETY (NEEDLE) ×2 IMPLANT
NS IRRIG 1000ML POUR BTL (IV SOLUTION) ×2 IMPLANT
PACK LAMINECTOMY NEURO (CUSTOM PROCEDURE TRAY) ×2 IMPLANT
PAD ARMBOARD 7.5X6 YLW CONV (MISCELLANEOUS) ×6 IMPLANT
ROD 35MM (Rod) ×4 IMPLANT
SCREW LOCK (Screw) ×4 IMPLANT
SCREW LOCK FXNS SPNE MAS PL (Screw) ×4 IMPLANT
SCREW PLIF MAS 5.0X35 (Screw) ×4 IMPLANT
SCREW SHANK 5.0X30MM (Screw) ×2 IMPLANT
SCREW SHANK 5.0X35 (Screw) ×2 IMPLANT
SCREW TULIP 5.5 (Screw) ×4 IMPLANT
SPONGE LAP 4X18 X RAY DECT (DISPOSABLE) IMPLANT
SPONGE SURGIFOAM ABS GEL 100 (HEMOSTASIS) ×2 IMPLANT
STRIP CLOSURE SKIN 1/2X4 (GAUZE/BANDAGES/DRESSINGS) ×4 IMPLANT
SUT VIC AB 0 CT1 18XCR BRD8 (SUTURE) ×1 IMPLANT
SUT VIC AB 0 CT1 8-18 (SUTURE) ×1
SUT VIC AB 2-0 CP2 18 (SUTURE) ×2 IMPLANT
SUT VIC AB 3-0 SH 8-18 (SUTURE) ×4 IMPLANT
SYR 3ML LL SCALE MARK (SYRINGE) IMPLANT
TAPE STRIPS DRAPE STRL (GAUZE/BANDAGES/DRESSINGS) ×2 IMPLANT
TOWEL OR 17X24 6PK STRL BLUE (TOWEL DISPOSABLE) ×2 IMPLANT
TOWEL OR 17X26 10 PK STRL BLUE (TOWEL DISPOSABLE) ×2 IMPLANT
TRAP SPECIMEN MUCOUS 40CC (MISCELLANEOUS) ×2 IMPLANT
TRAY FOLEY W/METER SILVER 14FR (SET/KITS/TRAYS/PACK) ×2 IMPLANT
WATER STERILE IRR 1000ML POUR (IV SOLUTION) ×2 IMPLANT

## 2014-09-16 NOTE — Evaluation (Signed)
Physical Therapy Evaluation Patient Details Name: Bethany [name] MRN: [id number] DOB: 1945/03/18 Today's Date: 09/16/2014   History of Present Illness  69 y.o. female s/p L/4-5 FOR MAXIMUM ACCESS (MAS) POSTERIOR LUMBAR INTERBODY FUSION.  Clinical Impression  Patient is seen following the above procedure, presenting with functional limitations due to the deficits listed below (see PT Problem List). Reviewed bed mobility, transfers, and gait training with min guard to min assist with each task. Reviewed safety with mobility. Slow and guarded gait but stable with use of RW, no buckling or loss of balance noted. Patient will benefit from skilled PT to increase their independence and safety with mobility to allow discharge to the venue listed below.       Follow Up Recommendations No PT follow up;Supervision/Assistance - 24 hour (Initially)    Equipment Recommendations  Rolling walker with 5" wheels    Recommendations for Other Services       Precautions / Restrictions Precautions Precautions: Back Precaution Booklet Issued: Yes (comment) Precaution Comments: reviewed handout Required Braces or Orthoses: Spinal Brace Spinal Brace: Lumbar corset;Applied in sitting position Restrictions Weight Bearing Restrictions: No      Mobility  Bed Mobility Overal bed mobility: Needs Assistance Bed Mobility: Rolling;Sidelying to Sit Rolling: Min assist Sidelying to sit: Min assist       General bed mobility comments: Min assist to roll onto side with education for log roll technique, and assist for trunk control with transition to seated position on EOB. Reports lightheadedness upon sitting but resolved within 1 minute.  Transfers Overall transfer level: Needs assistance Equipment used: Rolling walker (2 wheeled) Transfers: Sit to/from Stand Sit to Stand: Min assist         General transfer comment: Min assist for boost to stand from lowest bed setting. VC for hand placement and to  maintain back precautions.  Ambulation/Gait Ambulation/Gait assistance: Min guard Ambulation Distance (Feet): 60 Feet Assistive device: Rolling walker (2 wheeled) Gait Pattern/deviations: Step-through pattern;Decreased stride length;Trunk flexed;Narrow base of support Gait velocity: slow Gait velocity interpretation: <1.8 ft/sec, indicative of risk for recurrent falls General Gait Details: Educated on safe DME use with a rolling walker. VC for upright posture, forward gaze, and to widen base of support. Very slow and guarded but stable with simple, level gait while using RW.  Stairs            Wheelchair Mobility    Modified Rankin (Stroke Patients Only)       Balance Overall balance assessment: Needs assistance Sitting-balance support: No upper extremity supported;Feet supported Sitting balance-Leahy Scale: Good     Standing balance support: No upper extremity supported Standing balance-Leahy Scale: Fair                               Pertinent Vitals/Pain Pain Assessment: Faces Faces Pain Scale: Hurts little more Pain Location: back Pain Descriptors / Indicators: Aching Pain Intervention(s): Monitored during session;Repositioned    Home Living Family/patient expects to be discharged to:: Private residence Living Arrangements: Children Available Help at Discharge: Family;Available 24 hours/day (Daughter taking time off work to care for pt at d/c) Type of Home: House Home Access: Level entry     Home Layout: One level Home Equipment: None Additional Comments: High bed, uses step-stool to climb into bed.    Prior Function Level of Independence: Independent         Comments: Walks outside for exercise     Hand  Dominance        Extremity/Trunk Assessment   Upper Extremity Assessment: Defer to OT evaluation           Lower Extremity Assessment: Generalized weakness (Lt hallux weakness, reports hx of fracture)          Communication   Communication: No difficulties  Cognition Arousal/Alertness: Awake/alert Behavior During Therapy: WFL for tasks assessed/performed Overall Cognitive Status: Within Functional Limits for tasks assessed                      General Comments General comments (skin integrity, edema, etc.): Reviewed precautions and safety with mobility. Reviewed application of lumbar corset.    Exercises        Assessment/Plan    PT Assessment Patient needs continued PT services  PT Diagnosis Difficulty walking;Abnormality of gait;Generalized weakness;Acute pain   PT Problem List Decreased strength;Decreased activity tolerance;Decreased balance;Decreased mobility;Decreased knowledge of use of DME;Decreased knowledge of precautions;Obesity;Pain  PT Treatment Interventions DME instruction;Gait training;Therapeutic activities;Therapeutic exercise;Functional mobility training;Balance training;Neuromuscular re-education;Patient/family education;Modalities   PT Goals (Current goals can be found in the Care Plan section) Acute Rehab PT Goals Patient Stated Goal: Walk without pain PT Goal Formulation: With patient Time For Goal Achievement: 09/30/14 Potential to Achieve Goals: Good Additional Goals Additional Goal #1: Patient will be able to step up backwards onto stepping stool with use of RW for safe bed entry.    Frequency Min 5X/week   Barriers to discharge Decreased caregiver support Family lives next door however daughter has arranged 24/7 supervision by taking off from work for a short period of time.    Co-evaluation               End of Session Equipment Utilized During Treatment: Back brace Activity Tolerance: Patient tolerated treatment well Patient left: in chair;with call bell/phone within reach Nurse Communication: Mobility status    Functional Assessment Tool Used: Clinical Observation Functional Limitation: Mobility: Walking and moving around Mobility:  Walking and Moving Around Current Status (K4401): At least 1 percent but less than 20 percent impaired, limited or restricted Mobility: Walking and Moving Around Goal Status [phone]): At least 1 percent but less than 20 percent impaired, limited or restricted    Time: 1420-1452 PT Time Calculation (min) (ACUTE ONLY): 32 min   Charges:   PT Evaluation $Initial PT Evaluation Tier I: 1 Procedure PT Treatments $Gait Training: 8-22 mins   PT G Codes:   PT G-Codes **NOT FOR INPATIENT CLASS** Functional Assessment Tool Used: Clinical Observation Functional Limitation: Mobility: Walking and moving around Mobility: Walking and Moving Around Current Status (D6644): At least 1 percent but less than 20 percent impaired, limited or restricted Mobility: Walking and Moving Around Goal Status [phone]): At least 1 percent but less than 20 percent impaired, limited or restricted    Berton Mount 09/16/2014, 3:57 PM [facility], [place] [phone]

## 2014-09-16 NOTE — Progress Notes (Signed)
Pt arrived to 5c17 @ 1140, Pt A&Ox 4, c/o pain 9/10. Dressing CDI, no drains. Pt VS taken, pt on O2 from PACU, Fluids runningat 75 cc/hr. Foley intact, unclamped. Pt without distress. Diet ordered, will monitor.

## 2014-09-16 NOTE — Progress Notes (Signed)
Patient refused to have her cather removed. Stated that the lasix would make her have to go to the bathroom a lot over night. States that she would want it removed in the morning when she wakes up.

## 2014-09-16 NOTE — Op Note (Signed)
09/16/2014  10:15 AM  PATIENT:  Bethany [name]  69 y.o. 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 Decompressive lumbar laminectomy L4-5 requiring more work than would be required for a simple exposure of the disk for PLIF in order to adequately decompress the neural elements and address the spinal stenosis 2. Posterior lumbar interbody fusion L4-5 using PEEK interbody cages packed with morcellized allograft and autograft 3. Posterior fixation L4-5 using nuvasive cortical pedicle screws.  4. Intertransverse arthrodesis L4-5 using morcellized autograft and allograft.  SURGEON:  Marikay Alar, MD  ASSISTANTS: dr Gerlene Fee  ANESTHESIA:  General  EBL: 100 ml  Total I/O In: 1400 [I.V.:1400] Out: 200 [Urine:200]  BLOOD ADMINISTERED:none  DRAINS: Hemovac   INDICATION FOR PROCEDURE: This patient presented with a long history of back pain with leg pain. MRI showed a autofusion at L5-S1 with an adjacent level spondylolisthesis with spinal stenosis. She tried medical management without relief. I recommended decompression and instrumented fusion to address her segmental instability and spinal stenosis. Patient understood the risks, benefits, and alternatives and potential outcomes and wished to proceed.  PROCEDURE DETAILS:  The patient was brought to the operating room. After induction of generalized endotracheal anesthesia the patient was rolled into the prone position on chest rolls and all pressure points were padded. The patient's lumbar region was cleaned and then prepped with DuraPrep and draped in the usual sterile fashion. Anesthesia was injected and then a dorsal midline incision was made and carried down to the lumbosacral fascia. The fascia was opened and the paraspinous musculature was taken down in a subperiosteal fashion to expose L4-5. A self-retaining retractor was  placed. Intraoperative fluoroscopy confirmed my level, and I started with placement of the L4 cortical pedicle screws. The pedicle screw entry zones were identified utilizing surface landmarks and  AP and lateral fluoroscopy. I scored the cortex with the high-speed drill and then used the hand drill and EMG monitoring to drill an upward and outward direction into the pedicle. I then tapped line to line, and the tap was also monitored. I then placed a 5-0 x 35 mm cortical pedicle screw into the pedicles of L4 bilaterally. I then turned my attention to the decompression and the spinous process was removed and complete lumbar laminectomies, hemi- facetectomies, and foraminotomies were performed at L4-5. The patient had significant spinal stenosis and this required more work than would be required for a simple exposure of the disc for posterior lumbar interbody fusion. Much more generous decompression was undertaken in order to adequately decompress the neural elements and address the patient's leg pain. The yellow ligament was removed to expose the underlying dura and nerve roots, and generous foraminotomies were performed to adequately decompress the neural elements. Both the exiting and traversing nerve roots were decompressed on both sides until a coronary dilator passed easily along the nerve roots. Once the decompression was complete, I turned my attention to the posterior lower lumbar interbody fusion. The epidural venous vasculature was coagulated and cut sharply. Disc space was incised and the initial discectomy was performed with pituitary rongeurs. The disc space was distracted with sequential distractors to a height of 9 mm. We then used a series of scrapers and shavers to prepare the endplates for fusion. The midline was prepared with Epstein curettes. Once the complete discectomy was finished, we packed an appropriate sized peek interbody cage with local autograft and morcellized  allograft, gently  retracted the nerve root, and tapped the cage into position at L4-5.  The midline between the cages was packed with morselized autograft and allograft. We then turned our attention to the placement of the lower pedicle screws. The pedicle screw entry zones were identified utilizing surface landmarks and fluoroscopy. I drilled into each pedicle utilizing the hand drill and EMG monitoring, and tapped each pedicle with the appropriate tap. We palpated with a ball probe to assure no break in the cortex. We then placed 5-0 x 35 mm cortical pedicle screws into the pedicles bilaterally at L5. We then decorticated the transverse processes and laid a mixture of morcellized autograft and allograft out over these to perform intertransverse arthrodesis at L4-5. We then placed lordotic rods into the multiaxial screw heads of the pedicle screws and locked these in position with the locking caps and anti-torque device. We then checked our construct with AP and lateral fluoroscopy. Irrigated with copious amounts of bacitracin-containing saline solution. Placed a medium Hemovac drain through separate stab incision. Inspected the nerve roots once again to assure adequate decompression, lined to the dura with Gelfoam, and closed the muscle and the fascia with 0 Vicryl. Closed the subcutaneous tissues with 2-0 Vicryl and subcuticular tissues with 3-0 Vicryl. The skin was closed with benzoin and Steri-Strips. Dressing was then applied, the patient was awakened from general anesthesia and transported to the recovery room in stable condition. At the end of the procedure all sponge, needle and instrument counts were correct.   PLAN OF CARE: Admit to inpatient   PATIENT DISPOSITION:  PACU - hemodynamically stable.   Delay start of Pharmacological VTE agent (>24hrs) due to surgical blood loss or risk of bleeding:  yes

## 2014-09-16 NOTE — Plan of Care (Signed)
Problem: Consults Goal: Diagnosis - Spinal Surgery Outcome: Completed/Met Date Met:  09/16/14 Thoraco/Lumbar Spine Fusion

## 2014-09-16 NOTE — H&P (Signed)
Subjective: Bethany [name] is a 69 y.o. female admitted for PLIF L4-5. Onset of symptoms was months ago, progressive since that time.  The pain is rated severe, and is located at the low back and radiates to legs. The pain is described as aching and occurs all day. The symptoms have been progressive. Symptoms are exacerbated by activity. 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  Past Medical History  Diagnosis Date  . Arthritis   . Diabetes mellitus without complication   . Hyperlipidemia   . Hypertension   . GERD (gastroesophageal reflux disease)     better after gall bladder surgery  . Fibromyalgia     Past Surgical History  Procedure Laterality Date  . Abdominal hysterectomy  1997  . Cholecystectomy  2010  . Cataract extraction Bilateral 2014  . Colonoscopy      Prior to Admission medications   Medication Sig Start Date End Date Taking? Authorizing Provider  amLODipine (NORVASC) 10 MG tablet TAKE 1 TABLET BY MOUTH DAILY 06/14/14  Yes Lorre Munroe, NP  aspirin 81 MG tablet Take 81 mg by mouth daily.   Yes Historical Provider, MD  atorvastatin (LIPITOR) 20 MG tablet TAKE 1 TABLET BY MOUTH DAILY 06/14/14  Yes Lorre Munroe, NP  furosemide (LASIX) 20 MG tablet Take 1 tablet (20 mg total) by mouth daily as needed. 09/01/14  Yes Lorre Munroe, NP  glimepiride (AMARYL) 4 MG tablet Take 1 tablet (4 mg total) by mouth daily with breakfast. 03/15/14  Yes Lorre Munroe, NP  insulin glargine (LANTUS) 100 UNIT/ML injection Inject 40 Units into the skin at bedtime.   Yes Historical Provider, MD  losartan (COZAAR) 50 MG tablet Take 1 tablet (50 mg total) by mouth daily. 03/15/14  Yes Lorre Munroe, NP  naproxen sodium (ANAPROX) 220 MG tablet Take 220 mg by mouth as needed.   Yes Historical Provider, MD   No Known Allergies  Social History  Substance Use Topics  . Smoking status: Never Smoker   . Smokeless tobacco: Never Used  . Alcohol Use: No    Family History  Problem Relation Age of Onset   . Arthritis Mother   . Cancer Mother     Ovarian  . Diabetes Mother   . Heart disease Father   . Hypertension Father      Review of Systems  Positive ROS: neg  All other systems have been reviewed and were otherwise negative with the exception of those mentioned in the HPI and as above.  Objective: Vital signs in last 24 hours: Temp:  [97.3 F (36.3 C)] 97.3 F (36.3 C) (09/02 0609) Pulse Rate:  [87] 87 (09/02 0609) Resp:  [18] 18 (09/02 0609) BP: (134)/(70) 134/70 mmHg (09/02 0609) SpO2:  [100 %] 100 % (09/02 0609)  General Appearance: Alert, cooperative, no distress, appears stated age Head: Normocephalic, without obvious abnormality, atraumatic Eyes: PERRL, conjunctiva/corneas clear, EOM's intact    Neck: Supple, symmetrical, trachea midline Back: Symmetric, no curvature, ROM normal, no CVA tenderness Lungs:  respirations unlabored Heart: Regular rate and rhythm Abdomen: Soft, non-tender Extremities: Extremities normal, atraumatic, no cyanosis or edema Pulses: 2+ and symmetric all extremities Skin: Skin color, texture, turgor normal, no rashes or lesions  NEUROLOGIC:   Mental status: Alert and oriented x4,  no aphasia, good attention span, fund of knowledge, and memory Motor Exam - grossly normal Sensory Exam - grossly normal Reflexes: 1+ Coordination - grossly normal Gait - grossly normal Balance - grossly normal  Cranial Nerves: I: smell Not tested  II: visual acuity  OS: nl    OD: nl  II: visual fields Full to confrontation  II: pupils Equal, round, reactive to light  III,VII: ptosis None  III,IV,VI: extraocular muscles  Full ROM  V: mastication Normal  V: facial light touch sensation  Normal  V,VII: corneal reflex  Present  VII: facial muscle function - upper  Normal  VII: facial muscle function - lower Normal  VIII: hearing Not tested  IX: soft palate elevation  Normal  IX,X: gag reflex Present  XI: trapezius strength  5/5  XI: sternocleidomastoid  strength 5/5  XI: neck flexion strength  5/5  XII: tongue strength  Normal    Data Review Lab Results  Component Value Date   WBC 7.1 09/06/2014   HGB 11.8* 09/06/2014   HCT 36.5 09/06/2014   MCV 80.9 09/06/2014   PLT 352 09/06/2014   Lab Results  Component Value Date   NA 140 09/06/2014   K 3.6 09/06/2014   CL 106 09/06/2014   CO2 26 09/06/2014   BUN 13 09/06/2014   CREATININE 1.05* 09/06/2014   GLUCOSE 216* 09/06/2014   Lab Results  Component Value Date   INR 1.14 09/06/2014    Assessment/Plan: Patient admitted for PLIF L4-5. Patient has failed a reasonable attempt at conservative therapy.  I explained the condition and procedure to the patient and answered any questions.  Patient wishes to proceed with procedure as planned. Understands risks/ benefits and typical outcomes of procedure.   Akyla Vavrek S 09/16/2014 6:53 AM

## 2014-09-16 NOTE — Anesthesia Postprocedure Evaluation (Signed)
  Anesthesia Post-op Note  Patient: Bethany [name]  Procedure(s) Performed: Procedure(s) with comments: L/4-5 FOR MAXIMUM ACCESS (MAS) POSTERIOR LUMBAR INTERBODY FUSION (PLIF) 1 LEVEL (N/A) - L/4-5 FOR MAXIMUM ACCESS (MAS) POSTERIOR LUMBAR INTERBODY FUSION (PLIF) 1 LEVEL  Patient Location: PACU  Anesthesia Type:General  Level of Consciousness: awake  Airway and Oxygen Therapy: Patient Spontanous Breathing and Patient connected to nasal cannula oxygen  Post-op Pain: mild  Post-op Assessment: Post-op Vital signs reviewed, Patient's Cardiovascular Status Stable, Respiratory Function Stable, Patent Airway and No signs of Nausea or vomiting              Post-op Vital Signs: Reviewed and stable  Last Vitals:  Filed Vitals:   09/16/14 1030  BP: 168/65  Pulse: 87  Temp:   Resp: 16    Complications: No apparent anesthesia complications

## 2014-09-16 NOTE — Anesthesia Procedure Notes (Signed)
Procedure Name: Intubation Date/Time: 09/16/2014 7:32 AM Performed by: Charm Barges, Gerardine Peltz R Pre-anesthesia Checklist: Patient identified, Emergency Drugs available, Suction available, Patient being monitored and Timeout performed Patient Re-evaluated:Patient Re-evaluated prior to inductionOxygen Delivery Method: Circle system utilized Preoxygenation: Pre-oxygenation with 100% oxygen Intubation Type: IV induction Ventilation: Mask ventilation without difficulty Laryngoscope Size: Mac and 3 Grade View: Grade II Tube type: Oral Tube size: 7.5 mm Number of attempts: 1 Airway Equipment and Method: Stylet Placement Confirmation: ETT inserted through vocal cords under direct vision,  positive ETCO2 and breath sounds checked- equal and bilateral Secured at: 21 cm Tube secured with: Tape Dental Injury: Teeth and Oropharynx as per pre-operative assessment

## 2014-09-16 NOTE — Transfer of Care (Signed)
Immediate Anesthesia Transfer of Care Note  Patient: Bethany [name]  Procedure(s) Performed: Procedure(s) with comments: L/4-5 FOR MAXIMUM ACCESS (MAS) POSTERIOR LUMBAR INTERBODY FUSION (PLIF) 1 LEVEL (N/A) - L/4-5 FOR MAXIMUM ACCESS (MAS) POSTERIOR LUMBAR INTERBODY FUSION (PLIF) 1 LEVEL  Patient Location: PACU  Anesthesia Type:General  Level of Consciousness: sedated and patient cooperative  Airway & Oxygen Therapy: Patient Spontanous Breathing and Patient connected to nasal cannula oxygen  Post-op Assessment: Report given to RN, Post -op Vital signs reviewed and stable and Patient moving all extremities  Post vital signs: Reviewed and stable  Last Vitals:  Filed Vitals:   09/16/14 0609  BP: 134/70  Pulse: 87  Temp: 36.3 C  Resp: 18    Complications: No apparent anesthesia complications

## 2014-09-17 DIAGNOSIS — E785 Hyperlipidemia, unspecified: Secondary | ICD-10-CM | POA: Diagnosis not present

## 2014-09-17 DIAGNOSIS — M4316 Spondylolisthesis, lumbar region: Secondary | ICD-10-CM | POA: Diagnosis not present

## 2014-09-17 DIAGNOSIS — Z7982 Long term (current) use of aspirin: Secondary | ICD-10-CM | POA: Diagnosis not present

## 2014-09-17 DIAGNOSIS — Z794 Long term (current) use of insulin: Secondary | ICD-10-CM | POA: Diagnosis not present

## 2014-09-17 DIAGNOSIS — E119 Type 2 diabetes mellitus without complications: Secondary | ICD-10-CM | POA: Diagnosis not present

## 2014-09-17 DIAGNOSIS — I10 Essential (primary) hypertension: Secondary | ICD-10-CM | POA: Diagnosis not present

## 2014-09-17 LAB — GLUCOSE, CAPILLARY
Glucose-Capillary: 312 mg/dL — ABNORMAL HIGH (ref 65–99)
Glucose-Capillary: 317 mg/dL — ABNORMAL HIGH (ref 65–99)

## 2014-09-17 MED ORDER — INSULIN GLARGINE 100 UNIT/ML ~~LOC~~ SOLN
40.0000 [IU] | Freq: Every day | SUBCUTANEOUS | Status: DC
  Administered 2014-09-18: 40 [IU] via SUBCUTANEOUS
  Filled 2014-09-17 (×3): qty 0.4

## 2014-09-17 NOTE — Progress Notes (Signed)
Physical Therapy Treatment Patient Details Name: Bethany [name] MRN: [id number] DOB: 1945/01/21 Today's Date: 09/17/2014    History of Present Illness 69 y.o. female s/p L/4-5 FOR MAXIMUM ACCESS (MAS) POSTERIOR LUMBAR INTERBODY FUSION.    PT Comments    Patient seen with daughter present, good cues from daughter.  Pain is decreased, patient is performing better today, overall MIN Guard to Supervision for short distance mobility.  Initial cues for don/doff back brace, and MIN assist for bed mobility transfer.  Patient will continue to benefit from skilled PT services for bed mobility and to review safety before returning home with daughter to assist.   Follow Up Recommendations  No PT follow up;Supervision/Assistance - 24 hour (Initially)     Equipment Recommendations  Rolling walker with 5" wheels    Recommendations for Other Services       Precautions / Restrictions Precautions Precautions: Back Precaution Comments: Pt able to state 2/3 back precautions, need gestural cues from daugher to remember "No arching" Required Braces or Orthoses: Spinal Brace Spinal Brace: Lumbar corset;Applied in sitting position Restrictions Weight Bearing Restrictions: No    Mobility  Bed Mobility Overal bed mobility: Needs Assistance Bed Mobility: Rolling;Sidelying to Sit Rolling: Min guard Sidelying to sit: Min assist       General bed mobility comments: VCs for technique and LE management.  no rail, HOB flat  Transfers Overall transfer level: Needs assistance Equipment used: Rolling walker (2 wheeled) Transfers: Sit to/from Stand Sit to Stand: Min guard;Supervision         General transfer comment: cues for technique, good cues from daughter.  Ambulation/Gait Ambulation/Gait assistance: Min guard;Supervision Ambulation Distance (Feet): 75 Feet Assistive device: Rolling walker (2 wheeled) Gait Pattern/deviations: Step-through pattern Gait velocity: slow Gait velocity  interpretation: Below normal speed for age/gender     Stairs            Wheelchair Mobility    Modified Rankin (Stroke Patients Only)       Balance     Sitting balance-Leahy Scale: Good       Standing balance-Leahy Scale: Fair                      Cognition Arousal/Alertness: Awake/alert Behavior During Therapy: WFL for tasks assessed/performed Overall Cognitive Status: Within Functional Limits for tasks assessed       Memory: Decreased recall of precautions              Exercises      General Comments        Pertinent Vitals/Pain Pain Assessment: 0-10 Pain Score: 2  Pain Location: back Pain Descriptors / Indicators: Sore Pain Intervention(s): Monitored during session    Home Living Family/patient expects to be discharged to:: Private residence Living Arrangements: Children Available Help at Discharge: Family;Available 24 hours/day (daughter) Type of Home: House Home Access: Level entry   Home Layout: One level Home Equipment: None Additional Comments: High bed, uses step-stool to climb into bed.    Prior Function Level of Independence: Independent      Comments: Walks outside for exercise   PT Goals (current goals can now be found in the care plan section) Acute Rehab PT Goals Patient Stated Goal: home tomorrow PT Goal Formulation: With patient Time For Goal Achievement: 09/30/14 Potential to Achieve Goals: Good Progress towards PT goals: Progressing toward goals    Frequency  Min 5X/week    PT Plan Current plan remains appropriate    Co-evaluation  End of Session Equipment Utilized During Treatment: Back brace Activity Tolerance: Patient tolerated treatment well Patient left: in chair;with call bell/phone within reach;with family/visitor present     Time: 1345-1415 PT Time Calculation (min) (ACUTE ONLY): 30 min  Charges:  $Gait Training: 8-22 mins $Therapeutic Activity: 8-22 mins                     G Codes:      Trinisha Paget L 2014/10/17, 3:41 PM

## 2014-09-17 NOTE — Evaluation (Signed)
Occupational Therapy Evaluation Patient Details Name: Bethany [name] MRN: [id number] DOB: Nov 05, 1945 Today's Date: 09/17/2014    History of Present Illness 69 y.o. female s/p L/4-5 FOR MAXIMUM ACCESS (MAS) POSTERIOR LUMBAR INTERBODY FUSION.   Clinical Impression   This 69 yo female admitted with above presents to acute OT with back precautions, back brace, obesity, decreased mobility, decreased balance, and pain all affecting her PLOF of BADLs/IADLs of independent. She will benefit from acute OT without need for follow up.    Follow Up Recommendations  No OT follow up    Equipment Recommendations  3 in 1 bedside comode (wide)       Precautions / Restrictions Precautions Precautions: Back Precaution Comments: Pt able to state 2/3 back precautions, need gestural cues from daugher to remember "No arching" Required Braces or Orthoses: Spinal Brace Spinal Brace: Lumbar corset;Applied in sitting position Restrictions Weight Bearing Restrictions: No      Mobility Bed Mobility Overal bed mobility: Needs Assistance Bed Mobility: Rolling;Sidelying to Sit Rolling: Min assist Sidelying to sit: Min assist       General bed mobility comments: VCs for technique, without rail and HOB up  Transfers Overall transfer level: Needs assistance Equipment used: Rolling walker (2 wheeled) Transfers: Sit to/from Stand Sit to Stand: Min assist                   ADL Overall ADL's : Needs assistance/impaired Eating/Feeding: Independent;Sitting   Grooming: Set up;Sitting   Upper Body Bathing: Set up;Sitting   Lower Body Bathing: Maximal assistance (with min A sit<>stand)   Upper Body Dressing : Min guard;Sitting Upper Body Dressing Details (indicate cue type and reason): including brace Lower Body Dressing: Total assistance (with min A sit<>stand)   Toilet Transfer: Minimal assistance;Ambulation;RW;Comfort height toilet;Grab bars Toilet Transfer Details (indicate cue type and  reason): wide base of support and feet externally rotate to help with sit<>stand Toileting- Clothing Manipulation and Hygiene: Total assistance (with min A sit<>stand)               Vision Additional Comments: No change from baseline          Pertinent Vitals/Pain Pain Assessment: 0-10 Pain Score: 4  Pain Location: back Pain Descriptors / Indicators: Aching;Sore Pain Intervention(s): Monitored during session;Repositioned     Hand Dominance Right   Extremity/Trunk Assessment Upper Extremity Assessment Upper Extremity Assessment: Overall WFL for tasks assessed           Communication Communication Communication: No difficulties   Cognition Arousal/Alertness: Awake/alert Behavior During Therapy: WFL for tasks assessed/performed Overall Cognitive Status: Within Functional Limits for tasks assessed       Memory: Decreased recall of precautions                        Home Living Family/patient expects to be discharged to:: Private residence Living Arrangements: Children Available Help at Discharge: Family;Available 24 hours/day (daughter) Type of Home: House Home Access: Level entry     Home Layout: One level     Bathroom Shower/Tub: Tub/shower unit;Curtain Shower/tub characteristics: Engineer, building services: Standard     Home Equipment: None   Additional Comments: High bed, uses step-stool to climb into bed.      Prior Functioning/Environment Level of Independence: Independent        Comments: Walks outside for exercise    OT Diagnosis: Generalized weakness;Acute pain   OT Problem List: Decreased range of motion;Impaired balance (sitting and/or standing);Pain;Obesity;Impaired UE functional use;Decreased  knowledge of precautions;Decreased knowledge of use of DME or AE   OT Treatment/Interventions: Self-care/ADL training;Patient/family education;Balance training;DME and/or AE instruction;Therapeutic activities    OT Goals(Current goals  can be found in the care plan section) Acute Rehab OT Goals Patient Stated Goal: home tomorrow OT Goal Formulation: With patient Time For Goal Achievement: 09/24/14 Potential to Achieve Goals: Good  OT Frequency: Min 3X/week              End of Session Equipment Utilized During Treatment: Rolling walker;Back brace Nurse Communication:  (Pt asking about foley coming out)  Activity Tolerance: Patient tolerated treatment well Patient left: in chair;with call bell/phone within reach;with family/visitor present   Time: 1236-1301 OT Time Calculation (min): 25 min Charges:  OT General Charges $OT Visit: 1 Procedure OT Evaluation $Initial OT Evaluation Tier I: 1 Procedure OT Treatments $Self Care/Home Management : 8-22 mins G-Codes: OT G-codes **NOT FOR INPATIENT CLASS** Functional Assessment Tool Used: Clinical observation Functional Limitation: Self care Self Care Current Status (A2130): At least 60 percent but less than 80 percent impaired, limited or restricted Self Care Goal Status (Q6578): At least 20 percent but less than 40 percent impaired, limited or restricted  Evette Georges [phone] 09/17/2014, 1:30 PM

## 2014-09-17 NOTE — Progress Notes (Signed)
Patient ID: Bethany [name], female   DOB: October 23, 1945, 69 y.o.   MRN: [id number] Afeb, vss Doing well post op day 1 with decreasing pain. Wound clean and dry. Will increase activity, and hopefully home tomorrow.

## 2014-09-18 DIAGNOSIS — I10 Essential (primary) hypertension: Secondary | ICD-10-CM | POA: Diagnosis not present

## 2014-09-18 DIAGNOSIS — Z794 Long term (current) use of insulin: Secondary | ICD-10-CM | POA: Diagnosis not present

## 2014-09-18 DIAGNOSIS — E119 Type 2 diabetes mellitus without complications: Secondary | ICD-10-CM | POA: Diagnosis not present

## 2014-09-18 DIAGNOSIS — R269 Unspecified abnormalities of gait and mobility: Secondary | ICD-10-CM | POA: Diagnosis not present

## 2014-09-18 DIAGNOSIS — Z7982 Long term (current) use of aspirin: Secondary | ICD-10-CM | POA: Diagnosis not present

## 2014-09-18 DIAGNOSIS — M4316 Spondylolisthesis, lumbar region: Secondary | ICD-10-CM | POA: Diagnosis not present

## 2014-09-18 DIAGNOSIS — E785 Hyperlipidemia, unspecified: Secondary | ICD-10-CM | POA: Diagnosis not present

## 2014-09-18 LAB — GLUCOSE, CAPILLARY
Glucose-Capillary: 126 mg/dL — ABNORMAL HIGH (ref 65–99)
Glucose-Capillary: 111 mg/dL — ABNORMAL HIGH (ref 65–99)
Glucose-Capillary: 267 mg/dL — ABNORMAL HIGH (ref 65–99)
Glucose-Capillary: 328 mg/dL — ABNORMAL HIGH (ref 65–99)

## 2014-09-18 MED ORDER — METHOCARBAMOL 500 MG PO TABS: 750.0000 mg | ORAL_TABLET | Freq: Four times a day (QID) | ORAL | Status: DC | PRN

## 2014-09-18 MED ORDER — OXYCODONE-ACETAMINOPHEN 5-325 MG PO TABS: 1.0000 | ORAL_TABLET | ORAL | Status: DC | PRN

## 2014-09-18 NOTE — Progress Notes (Signed)
Patient discharged at 1453 to home with daughter.

## 2014-09-18 NOTE — Progress Notes (Signed)
Occupational Therapy Treatment Patient Details Name: Bethany [name] MRN: [id number] DOB: [birth year] Today's Date: 09/18/2014    History of present illness 69 y.o. female s/p L/4-5 FOR MAXIMUM ACCESS (MAS) POSTERIOR LUMBAR INTERBODY FUSION.   OT comments  Pt. Is doing well with progressing toward OT goals. Pt. Was able to don gown and back brace at setup level. Pt. Was able to AMB with walker over 150 feet to practice tub transfers at Mod I level. Pt. Was able to perform tub transfer at [facility] A level with cues for technique. Pt. Was instructed to have A with transfers initially to increase safety and I. Pt. Was ed. On use of AE for LE ADLs and was able to return demo post education.   Follow Up Recommendations  Home health OT    Equipment Recommendations  3 in 1 bedside comode    Recommendations for Other Services      Precautions / Restrictions Precautions Precautions: Back Precaution Comments: Pt. able to state and follow 3/3 back precuations. Required Braces or Orthoses: Spinal Brace Spinal Brace: Lumbar corset;Applied in sitting position Restrictions Weight Bearing Restrictions: No       Mobility Bed Mobility Overal bed mobility: Modified Independent Bed Mobility: Supine to Sit   Sidelying to sit: Modified independent (Device/Increase time)       General bed mobility comments: Pt. doing well following back precautions.   Transfers       Sit to Stand: Modified independent (Device/Increase time)              Balance                                   ADL       Grooming: Wash/dry hands;Wash/dry face;Modified independent       Lower Body Bathing: Supervison/ safety;Cueing for safety   Upper Body Dressing : Modified independent   Lower Body Dressing: Supervision/safety;With adaptive equipment   Toilet Transfer: Modified Independent;BSC   Toileting- Clothing Manipulation and Hygiene: Modified independent         General ADL  Comments: Pt. is doing well following back precuations.       Vision                     Perception     Praxis      Cognition   Behavior During Therapy: WFL for tasks assessed/performed Overall Cognitive Status: Within Functional Limits for tasks assessed                       Extremity/Trunk Assessment               Exercises     Shoulder Instructions       General Comments      Pertinent Vitals/ Pain       Pain Assessment: 0-10 Pain Score: 2  Pain Location:  (back) Pain Descriptors / Indicators: Sore Pain Intervention(s): Monitored during session  Home Living                                          Prior Functioning/Environment              Frequency       Progress Toward Goals  OT Goals(current goals can now be found  in the care plan section)  Progress towards OT goals: Progressing toward goals  Acute Rehab OT Goals Patient Stated Goal:  (plan on d/c today.)  Plan      Co-evaluation                 End of Session Equipment Utilized During Treatment: Rolling walker;Back brace   Activity Tolerance Patient tolerated treatment well   Patient Left in bed;with call bell/phone within reach;with nursing/sitter in room   Nurse Communication          Time: 1610-9604 OT Time Calculation (min): 44 min  Charges: OT General Charges $OT Visit: 1 Procedure OT Treatments $Self Care/Home Management : 38-52 mins  Ashby Leflore 09/18/2014, 10:42 AM

## 2014-09-18 NOTE — Care Management Note (Signed)
Case Management Note  Patient Details  Name: Bethany [name] MRN: [id number] Date of Birth: 1945-03-16  Subjective/Objective:    Dc planning                Action/Plan: Late entry- [facility] notified prior to dc for RW and 3n1 for home. Delivered to room prior to dc.   Expected Discharge Date:  09/18/2014               Expected Discharge Plan:  Home/Self Care  In-House Referral:     Discharge planning Services  CM Consult  Post Acute Care Choice:    Choice offered to:     DME Arranged:  3-N-1, Walker rolling DME Agency:  [facility]  HH Arranged:    [facility] Agency:     Status of Service:  Completed, signed off  Medicare Important Message Given:    Date Medicare IM Given:    Medicare IM give by:    Date Additional Medicare IM Given:    Additional Medicare Important Message give by:     If discussed at Long Length of Stay Meetings, dates discussed:    Additional Comments:  Elliot Cousin, RN 09/18/2014, 9:19 PM

## 2014-09-18 NOTE — Progress Notes (Signed)
Patient lying in bed watching tv, no distress or pain.   

## 2014-09-18 NOTE — Progress Notes (Signed)
Discharge instructions given to patient and daughter.  Will call when equipment arrives  For transport.  Patient in no distress or pain.

## 2014-09-18 NOTE — Progress Notes (Signed)
Physical Therapy Treatment Patient Details Name: Bethany [name] MRN: [id number] DOB: 1945-09-01 Today's Date: 09/18/2014    History of Present Illness 69 y.o. female s/p L/4-5 FOR MAXIMUM ACCESS (MAS) POSTERIOR LUMBAR INTERBODY FUSION.    PT Comments    Patient making good gains with mobility and gait.  Able to negotiate step with min assist today.  Ready for d/c from PT perspective.  Follow Up Recommendations  No PT follow up;Supervision/Assistance - 24 hour     Equipment Recommendations  Rolling walker with 5" wheels    Recommendations for Other Services       Precautions / Restrictions Precautions Precautions: Back Precaution Comments: Pt. able to state and follow 3/3 back precuations. Required Braces or Orthoses: Spinal Brace Spinal Brace: Lumbar corset;Applied in sitting position Restrictions Weight Bearing Restrictions: No    Mobility  Bed Mobility               General bed mobility comments: Patient sitting EOB as PT entered room  Transfers Overall transfer level: Modified independent Equipment used: Rolling walker (2 wheeled)             General transfer comment: Using correct technique.  Ambulation/Gait Ambulation/Gait assistance: Supervision Ambulation Distance (Feet): 180 Feet Assistive device: Rolling walker (2 wheeled) Gait Pattern/deviations: Step-through pattern;Decreased stride length   Gait velocity interpretation: Below normal speed for age/gender General Gait Details: Slow, steady gait with use of RW.   Stairs Stairs: Yes Stairs assistance: Min assist Stair Management: No rails;Step to pattern;Backwards;With walker Number of Stairs: 1 General stair comments: Instructed patient to remain upright and have assist to support UE's as she steps back onto step/stool to get onto bed.  Use support to come down to floor as well.  Min assist for balance/safety.  Wheelchair Mobility    Modified Rankin (Stroke Patients Only)        Balance                                    Cognition Arousal/Alertness: Awake/alert Behavior During Therapy: WFL for tasks assessed/performed Overall Cognitive Status: Within Functional Limits for tasks assessed                      Exercises      General Comments        Pertinent Vitals/Pain Pain Assessment: 0-10 Pain Score: 3  Pain Location: back Pain Descriptors / Indicators: Sore Pain Intervention(s): Monitored during session;Repositioned    Home Living                      Prior Function            PT Goals (current goals can now be found in the care plan section) Progress towards PT goals: Progressing toward goals    Frequency  Min 5X/week    PT Plan Current plan remains appropriate    Co-evaluation             End of Session Equipment Utilized During Treatment: Back brace;Gait belt Activity Tolerance: Patient tolerated treatment well Patient left: in bed;with call bell/phone within reach (sitting EOB)     Time: 7829-5621 PT Time Calculation (min) (ACUTE ONLY): 10 min  Charges:  $Gait Training: 8-22 mins                    G Codes:  Functional Assessment Tool Used: Clinical  judgement Functional Limitation: Mobility: Walking and moving around Mobility: Walking and Moving Around Goal Status [phone]): At least 1 percent but less than 20 percent impaired, limited or restricted Mobility: Walking and Moving Around Discharge Status [phone]): At least 1 percent but less than 20 percent impaired, limited or restricted   Vena Austria 09/18/2014, 2:49 PM Durenda Hurt. Renaldo Fiddler, [facility] Acute Rehab Services Pager [phone]

## 2014-09-18 NOTE — Progress Notes (Signed)
No acute events AVSS Awake and alert 5/5 BLE Incision c/d/i Stable Ready for discharge

## 2014-09-18 NOTE — Discharge Summary (Signed)
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 Decompressive lumbar laminectomy L4-5 requiring more work than would be required for a simple exposure of the disk for PLIF in order to adequately decompress the neural elements and address the spinal stenosis 2. Posterior lumbar interbody fusion L4-5 using PEEK interbody cages packed with morcellized allograft and autograft 3. Posterior fixation L4-5 using nuvasive cortical pedicle screws.  4. Intertransverse arthrodesis L4-5 using morcellized autograft and allograft.  Hospital course: Bethany [name] was admitted to the hospital the morning of surgery and taken to the operating room for the above listed operation. She tolerated this well and was stable on discharge from the operating room to the PACU and then to the floor. She had an uneventful postoperative course. She was gradually able to increase her ambulation. Her pain is not controlled with only oral pain medicine. Physical therapy has cleared her for home health PT. She is discharged home at this time in stable medical neurological condition.  Discharge medications: Percocet for pain, Robaxim for muscle spasms. She'll resume the remainder of her prior medications.  Follow-up: 2 weeks

## 2014-09-20 ENCOUNTER — Encounter (HOSPITAL_COMMUNITY): Payer: Self-pay | Admitting: Neurological Surgery

## 2014-09-20 NOTE — Patient Outreach (Signed)
[facility]) Care Management  09/20/2014  SHAYLYNNE LUNT [birth year] [id number]   Due to several unsuccessful attempts of reaching the patient, I've sent a letter requesting the patient to contact me regarding extra help assistance.  Nasha Diss L. Destyni Hoppel, AAS [facility] Care Management Assistant

## 2014-10-14 ENCOUNTER — Other Ambulatory Visit: Payer: Self-pay

## 2014-10-14 NOTE — Patient Outreach (Signed)
Documentation of pharmacy case closure due to patient not responding within 10 days of letter being sent.    Steve Rattler, PharmD, [employer] [facility] [employer] [facility] [phone]

## 2014-10-14 NOTE — Patient Outreach (Signed)
[facility]) Care Management  10/14/2014  Bethany [name] [birth year] [id number]   Notification received from Steve Rattler, PharmD to close case due to no response from patient after 3 phone call attempts and outreach letter sent.  Aqueelah Cotrell L. Lynden Flemmer, AAS [facility] Care Management Assistant

## 2014-10-14 NOTE — Patient Outreach (Signed)
[facility]) Care Management  10/14/2014  Bethany [name] Jun 21, 1945 [id number]   No response from patient after 3 telephone calls or outreach letter that was sent on 09/20/2014 therefore I am closing the case out.  Ruthann Angulo L. Tyrhonda Georgiades, AAS [facility] Care Management Assistant

## 2014-10-17 DIAGNOSIS — M4316 Spondylolisthesis, lumbar region: Secondary | ICD-10-CM | POA: Diagnosis not present

## 2014-10-18 ENCOUNTER — Encounter (HOSPITAL_COMMUNITY): Payer: Self-pay | Admitting: *Deleted

## 2014-10-18 ENCOUNTER — Ambulatory Visit: Payer: Medicare Other | Admitting: Surgery

## 2014-10-18 NOTE — Progress Notes (Signed)
Bethany [name] reports that her CBGs have been in the 200s this week.  I talked with her about increased blood sugar and increased of wound infections, patients response, "oh, really."  Bethany Slawson also reported that  Her blood sugar drops ito the 40s, I instructed her to take 4 glucose tablets and recheck CBG in 156 minutes and call Pre - Op if it is after 0530.  I also instructed patient to call Pre- Op if CBG is > 220.

## 2014-10-19 ENCOUNTER — Other Ambulatory Visit (HOSPITAL_COMMUNITY): Payer: Self-pay | Admitting: Neurological Surgery

## 2014-10-19 DIAGNOSIS — K219 Gastro-esophageal reflux disease without esophagitis: Secondary | ICD-10-CM | POA: Diagnosis not present

## 2014-10-19 DIAGNOSIS — Z9049 Acquired absence of other specified parts of digestive tract: Secondary | ICD-10-CM | POA: Diagnosis not present

## 2014-10-19 DIAGNOSIS — Z833 Family history of diabetes mellitus: Secondary | ICD-10-CM | POA: Diagnosis not present

## 2014-10-19 DIAGNOSIS — M1990 Unspecified osteoarthritis, unspecified site: Secondary | ICD-10-CM | POA: Diagnosis not present

## 2014-10-19 DIAGNOSIS — I10 Essential (primary) hypertension: Secondary | ICD-10-CM | POA: Diagnosis not present

## 2014-10-19 DIAGNOSIS — M797 Fibromyalgia: Secondary | ICD-10-CM | POA: Diagnosis not present

## 2014-10-19 DIAGNOSIS — E119 Type 2 diabetes mellitus without complications: Secondary | ICD-10-CM | POA: Diagnosis not present

## 2014-10-19 DIAGNOSIS — T8132XA Disruption of internal operation (surgical) wound, not elsewhere classified, initial encounter: Secondary | ICD-10-CM | POA: Diagnosis not present

## 2014-10-19 DIAGNOSIS — E785 Hyperlipidemia, unspecified: Secondary | ICD-10-CM | POA: Diagnosis not present

## 2014-10-19 DIAGNOSIS — Z8041 Family history of malignant neoplasm of ovary: Secondary | ICD-10-CM | POA: Diagnosis not present

## 2014-10-19 DIAGNOSIS — Z8249 Family history of ischemic heart disease and other diseases of the circulatory system: Secondary | ICD-10-CM | POA: Diagnosis not present

## 2014-10-19 DIAGNOSIS — E669 Obesity, unspecified: Secondary | ICD-10-CM | POA: Diagnosis not present

## 2014-10-19 NOTE — Progress Notes (Signed)
Pt's surgery was cancelled for today and rescheduled for tomorrow, 10/20/14. Called pt and instructed her to be here at 9:30 AM tomorrow. She voiced understanding. Pre-op instructions were given yesterday to pt.

## 2014-10-19 NOTE — Anesthesia Preprocedure Evaluation (Addendum)
Anesthesia Evaluation  Patient identified by MRN, date of birth, ID band Patient awake    Reviewed: Allergy & Precautions, NPO status , Patient's Chart, lab work & pertinent test results  Airway Mallampati: II   Neck ROM: Full    Dental  (+) Dental Advisory Given, Teeth Intact   Pulmonary neg pulmonary ROS,    Pulmonary exam normal breath sounds clear to auscultation       Cardiovascular hypertension, Pt. on medications Normal cardiovascular exam Rhythm:Regular  EKG 08/2014 WNL   Neuro/Psych    GI/Hepatic GERD  Medicated,  Endo/Other  diabetes, Poorly Controlled, Type 2, Insulin DependentMorbid obesityBMI 45  Renal/GU      Musculoskeletal   Abdominal (+) + obese,   Peds  Hematology 12/36   Anesthesia Other Findings   Reproductive/Obstetrics                            Anesthesia Physical  Anesthesia Plan  ASA: III  Anesthesia Plan: General   Post-op Pain Management:    Induction: Intravenous  Airway Management Planned: Oral ETT and Video Laryngoscope Planned  Additional Equipment:   Intra-op Plan:   Post-operative Plan: Extubation in OR  Informed Consent: I have reviewed the patients History and Physical, chart, labs and discussed the procedure including the risks, benefits and alternatives for the proposed anesthesia with the patient or authorized representative who has indicated his/her understanding and acceptance.   Dental advisory given  Plan Discussed with: CRNA and Surgeon  Anesthesia Plan Comments:        Anesthesia Quick Evaluation

## 2014-10-19 NOTE — Progress Notes (Signed)
Called Dr. Jones' office for pre-op orders, spoke with Vanessa.  

## 2014-10-20 ENCOUNTER — Encounter (HOSPITAL_COMMUNITY): Payer: Self-pay | Admitting: Neurological Surgery

## 2014-10-20 ENCOUNTER — Inpatient Hospital Stay (HOSPITAL_COMMUNITY)
Admission: AD | Admit: 2014-10-20 | Discharge: 2014-10-21 | DRG: 903 | Disposition: A | Discharge status: Home / Self Care | Payer: Medicare Other | Source: Ambulatory Visit | Attending: Neurological Surgery | Admitting: Neurological Surgery

## 2014-10-20 ENCOUNTER — Encounter (HOSPITAL_COMMUNITY)
Admission: AD | Disposition: A | Discharge status: Home / Self Care | Payer: Self-pay | Source: Ambulatory Visit | Attending: Neurological Surgery

## 2014-10-20 ENCOUNTER — Ambulatory Visit (HOSPITAL_COMMUNITY): Payer: Medicare Other | Admitting: Anesthesiology

## 2014-10-20 DIAGNOSIS — M797 Fibromyalgia: Secondary | ICD-10-CM | POA: Diagnosis not present

## 2014-10-20 DIAGNOSIS — T8130XA Disruption of wound, unspecified, initial encounter: Secondary | ICD-10-CM | POA: Diagnosis present

## 2014-10-20 DIAGNOSIS — Z8041 Family history of malignant neoplasm of ovary: Secondary | ICD-10-CM | POA: Diagnosis not present

## 2014-10-20 DIAGNOSIS — K219 Gastro-esophageal reflux disease without esophagitis: Secondary | ICD-10-CM | POA: Diagnosis present

## 2014-10-20 DIAGNOSIS — T8131XA Disruption of external operation (surgical) wound, not elsewhere classified, initial encounter: Secondary | ICD-10-CM | POA: Diagnosis not present

## 2014-10-20 DIAGNOSIS — T8132XA Disruption of internal operation (surgical) wound, not elsewhere classified, initial encounter: Principal | ICD-10-CM | POA: Diagnosis present

## 2014-10-20 DIAGNOSIS — I10 Essential (primary) hypertension: Secondary | ICD-10-CM | POA: Diagnosis present

## 2014-10-20 DIAGNOSIS — E669 Obesity, unspecified: Secondary | ICD-10-CM | POA: Diagnosis present

## 2014-10-20 DIAGNOSIS — Z833 Family history of diabetes mellitus: Secondary | ICD-10-CM | POA: Diagnosis not present

## 2014-10-20 DIAGNOSIS — Y838 Other surgical procedures as the cause of abnormal reaction of the patient, or of later complication, without mention of misadventure at the time of the procedure: Secondary | ICD-10-CM | POA: Diagnosis present

## 2014-10-20 DIAGNOSIS — Y92239 Unspecified place in hospital as the place of occurrence of the external cause: Secondary | ICD-10-CM | POA: Diagnosis not present

## 2014-10-20 DIAGNOSIS — Z9049 Acquired absence of other specified parts of digestive tract: Secondary | ICD-10-CM

## 2014-10-20 DIAGNOSIS — E785 Hyperlipidemia, unspecified: Secondary | ICD-10-CM | POA: Diagnosis present

## 2014-10-20 DIAGNOSIS — M1990 Unspecified osteoarthritis, unspecified site: Secondary | ICD-10-CM | POA: Diagnosis present

## 2014-10-20 DIAGNOSIS — E119 Type 2 diabetes mellitus without complications: Secondary | ICD-10-CM | POA: Diagnosis not present

## 2014-10-20 DIAGNOSIS — Z8249 Family history of ischemic heart disease and other diseases of the circulatory system: Secondary | ICD-10-CM

## 2014-10-20 HISTORY — DX: Reserved for inherently not codable concepts without codable children: IMO0001

## 2014-10-20 HISTORY — PX: LUMBAR WOUND DEBRIDEMENT: SHX1988

## 2014-10-20 LAB — CBC
HCT: 30.7 % — ABNORMAL LOW (ref 36.0–46.0)
Hemoglobin: 9.9 g/dL — ABNORMAL LOW (ref 12.0–15.0)
MCH: 25.6 pg — ABNORMAL LOW (ref 26.0–34.0)
MCHC: 32.2 g/dL (ref 30.0–36.0)
MCV: 79.5 fL (ref 78.0–100.0)
Platelets: 318 10*3/uL (ref 150–400)
RBC: 3.86 MIL/uL — ABNORMAL LOW (ref 3.87–5.11)
RDW: 15.5 % (ref 11.5–15.5)
WBC: 6.3 10*3/uL (ref 4.0–10.5)

## 2014-10-20 LAB — BASIC METABOLIC PANEL
Anion gap: 8 (ref 5–15)
BUN: 12 mg/dL (ref 6–20)
CO2: 28 mmol/L (ref 22–32)
Calcium: 8.7 mg/dL — ABNORMAL LOW (ref 8.9–10.3)
Chloride: 105 mmol/L (ref 101–111)
Creatinine, Ser: 1.01 mg/dL — ABNORMAL HIGH (ref 0.44–1.00)
GFR calc Af Amer: >60 mL/min (ref >60)
GFR calc non Af Amer: 55 mL/min — ABNORMAL LOW (ref >60)
Glucose, Bld: 93 mg/dL (ref 65–99)
Potassium: 3.4 mmol/L — ABNORMAL LOW (ref 3.5–5.1)
Sodium: 141 mmol/L (ref 135–145)

## 2014-10-20 LAB — GLUCOSE, CAPILLARY
Glucose-Capillary: 97 mg/dL (ref 65–99)
Glucose-Capillary: 50 mg/dL — ABNORMAL LOW (ref 65–99)
Glucose-Capillary: 60 mg/dL — ABNORMAL LOW (ref 65–99)
Glucose-Capillary: 86 mg/dL (ref 65–99)

## 2014-10-20 SURGERY — LUMBAR WOUND DEBRIDEMENT
Anesthesia: General

## 2014-10-20 MED ORDER — HYDROMORPHONE HCL 1 MG/ML IJ SOLN
0.2500 mg | INTRAMUSCULAR | Status: DC | PRN
  Administered 2014-10-20 (×4): 0.5 mg via INTRAVENOUS

## 2014-10-20 MED ORDER — NEOSTIGMINE METHYLSULFATE 10 MG/10ML IV SOLN
INTRAVENOUS | Status: AC
  Filled 2014-10-20: qty 2

## 2014-10-20 MED ORDER — ROCURONIUM BROMIDE 50 MG/5ML IV SOLN
INTRAVENOUS | Status: AC
  Filled 2014-10-20: qty 1

## 2014-10-20 MED ORDER — ACETAMINOPHEN 650 MG RE SUPP: 650.0000 mg | RECTAL | Status: DC | PRN

## 2014-10-20 MED ORDER — ONDANSETRON HCL 4 MG/2ML IJ SOLN: 4.0000 mg | INTRAMUSCULAR | Status: DC | PRN

## 2014-10-20 MED ORDER — ONDANSETRON HCL 4 MG/2ML IJ SOLN
INTRAMUSCULAR | Status: AC
  Filled 2014-10-20: qty 2

## 2014-10-20 MED ORDER — GLIMEPIRIDE 4 MG PO TABS
4.0000 mg | ORAL_TABLET | Freq: Every day | ORAL | Status: DC
  Administered 2014-10-21: 4 mg via ORAL
  Filled 2014-10-20: qty 1

## 2014-10-20 MED ORDER — FUROSEMIDE 20 MG PO TABS: 20.0000 mg | ORAL_TABLET | Freq: Every day | ORAL | Status: DC | PRN

## 2014-10-20 MED ORDER — PROPOFOL 10 MG/ML IV BOLUS
INTRAVENOUS | Status: DC | PRN
  Administered 2014-10-20: 20 mg via INTRAVENOUS
  Administered 2014-10-20: 120 mg via INTRAVENOUS
  Administered 2014-10-20: 10 mg via INTRAVENOUS

## 2014-10-20 MED ORDER — VANCOMYCIN HCL 1000 MG IV SOLR
INTRAVENOUS | Status: DC | PRN
  Administered 2014-10-20: 1000 mg

## 2014-10-20 MED ORDER — SODIUM CHLORIDE 0.9 % IJ SOLN
3.0000 mL | Freq: Two times a day (BID) | INTRAMUSCULAR | Status: DC
  Administered 2014-10-20: 3 mL via INTRAVENOUS

## 2014-10-20 MED ORDER — THROMBIN 5000 UNITS EX SOLR
CUTANEOUS | Status: DC | PRN
  Administered 2014-10-20 (×2): 5000 [IU] via TOPICAL

## 2014-10-20 MED ORDER — PHENYLEPHRINE 40 MCG/ML (10ML) SYRINGE FOR IV PUSH (FOR BLOOD PRESSURE SUPPORT)
PREFILLED_SYRINGE | INTRAVENOUS | Status: AC
  Filled 2014-10-20: qty 10

## 2014-10-20 MED ORDER — FENTANYL CITRATE (PF) 250 MCG/5ML IJ SOLN
INTRAMUSCULAR | Status: AC
  Filled 2014-10-20: qty 5

## 2014-10-20 MED ORDER — SODIUM CHLORIDE 0.9 % IR SOLN
Status: DC | PRN
  Administered 2014-10-20: 11:00:00

## 2014-10-20 MED ORDER — ARTIFICIAL TEARS OP OINT
TOPICAL_OINTMENT | OPHTHALMIC | Status: AC
  Filled 2014-10-20: qty 3.5

## 2014-10-20 MED ORDER — LACTATED RINGERS IV SOLN
INTRAVENOUS | Status: DC | PRN
  Administered 2014-10-20: 12:00:00 via INTRAVENOUS

## 2014-10-20 MED ORDER — MORPHINE SULFATE (PF) 2 MG/ML IV SOLN
1.0000 mg | INTRAVENOUS | Status: DC | PRN
  Administered 2014-10-20: 4 mg via INTRAVENOUS
  Filled 2014-10-20 (×2): qty 2

## 2014-10-20 MED ORDER — SUGAMMADEX SODIUM 500 MG/5ML IV SOLN
INTRAVENOUS | Status: AC
  Filled 2014-10-20: qty 5

## 2014-10-20 MED ORDER — GLYCOPYRROLATE 0.2 MG/ML IJ SOLN
INTRAMUSCULAR | Status: AC
  Filled 2014-10-20: qty 2

## 2014-10-20 MED ORDER — DEXTROSE 5 % IV SOLN
3.0000 g | INTRAVENOUS | Status: AC
  Administered 2014-10-20: 3 g via INTRAVENOUS
  Filled 2014-10-20: qty 3000

## 2014-10-20 MED ORDER — HEMOSTATIC AGENTS (NO CHARGE) OPTIME
TOPICAL | Status: DC | PRN
  Administered 2014-10-20: 1 via TOPICAL

## 2014-10-20 MED ORDER — ONDANSETRON HCL 4 MG/2ML IJ SOLN: 4.0000 mg | Freq: Once | INTRAMUSCULAR | Status: DC | PRN

## 2014-10-20 MED ORDER — VANCOMYCIN HCL 1000 MG IV SOLR
INTRAVENOUS | Status: AC
Start: 2014-10-20 — End: 2014-10-21
  Filled 2014-10-20: qty 1000

## 2014-10-20 MED ORDER — SODIUM CHLORIDE 0.9 % IJ SOLN: 3.0000 mL | INTRAMUSCULAR | Status: DC | PRN

## 2014-10-20 MED ORDER — ONDANSETRON HCL 4 MG/2ML IJ SOLN
INTRAMUSCULAR | Status: DC | PRN
  Administered 2014-10-20: 4 mg via INTRAVENOUS

## 2014-10-20 MED ORDER — OXYCODONE-ACETAMINOPHEN 5-325 MG PO TABS
1.0000 | ORAL_TABLET | ORAL | Status: DC | PRN
  Administered 2014-10-20 – 2014-10-21 (×3): 2 via ORAL
  Filled 2014-10-20 (×3): qty 2

## 2014-10-20 MED ORDER — PHENOL 1.4 % MT LIQD: 1.0000 | OROMUCOSAL | Status: DC | PRN

## 2014-10-20 MED ORDER — SUCCINYLCHOLINE CHLORIDE 20 MG/ML IJ SOLN
INTRAMUSCULAR | Status: AC
  Filled 2014-10-20: qty 1

## 2014-10-20 MED ORDER — POTASSIUM CHLORIDE IN NACL 20-0.9 MEQ/L-% IV SOLN
INTRAVENOUS | Status: DC
  Administered 2014-10-20 – 2014-10-21 (×2): via INTRAVENOUS
  Filled 2014-10-20 (×4): qty 1000

## 2014-10-20 MED ORDER — LIDOCAINE HCL (CARDIAC) 20 MG/ML IV SOLN
INTRAVENOUS | Status: DC | PRN
  Administered 2014-10-20: 50 mg via INTRAVENOUS

## 2014-10-20 MED ORDER — LIDOCAINE HCL (CARDIAC) 20 MG/ML IV SOLN
INTRAVENOUS | Status: AC
  Filled 2014-10-20: qty 10

## 2014-10-20 MED ORDER — SUGAMMADEX SODIUM 500 MG/5ML IV SOLN
INTRAVENOUS | Status: DC | PRN
  Administered 2014-10-20: 500 mg via INTRAVENOUS

## 2014-10-20 MED ORDER — HYDROMORPHONE HCL 1 MG/ML IJ SOLN
INTRAMUSCULAR | Status: AC
  Administered 2014-10-20: 0.5 mg via INTRAVENOUS
  Filled 2014-10-20: qty 2

## 2014-10-20 MED ORDER — SODIUM CHLORIDE 0.9 % IV SOLN: 250.0000 mL | INTRAVENOUS | Status: DC

## 2014-10-20 MED ORDER — FENTANYL CITRATE (PF) 100 MCG/2ML IJ SOLN
INTRAMUSCULAR | Status: DC | PRN
  Administered 2014-10-20: 150 ug via INTRAVENOUS
  Administered 2014-10-20 (×2): 50 ug via INTRAVENOUS

## 2014-10-20 MED ORDER — DOCUSATE SODIUM 100 MG PO CAPS
100.0000 mg | ORAL_CAPSULE | Freq: Two times a day (BID) | ORAL | Status: DC
  Administered 2014-10-20 – 2014-10-21 (×2): 100 mg via ORAL
  Filled 2014-10-20 (×2): qty 1

## 2014-10-20 MED ORDER — AMLODIPINE BESYLATE 10 MG PO TABS
10.0000 mg | ORAL_TABLET | Freq: Every day | ORAL | Status: DC
  Administered 2014-10-20 – 2014-10-21 (×2): 10 mg via ORAL
  Filled 2014-10-20 (×2): qty 1

## 2014-10-20 MED ORDER — MIDAZOLAM HCL 2 MG/2ML IJ SOLN
INTRAMUSCULAR | Status: AC
  Filled 2014-10-20: qty 4

## 2014-10-20 MED ORDER — ARTIFICIAL TEARS OP OINT
TOPICAL_OINTMENT | OPHTHALMIC | Status: DC | PRN
  Administered 2014-10-20: 1 via OPHTHALMIC

## 2014-10-20 MED ORDER — MIDAZOLAM HCL 5 MG/5ML IJ SOLN
INTRAMUSCULAR | Status: DC | PRN
  Administered 2014-10-20: 2 mg via INTRAVENOUS

## 2014-10-20 MED ORDER — LACTATED RINGERS IV SOLN
INTRAVENOUS | Status: DC
  Administered 2014-10-20: 10:00:00 via INTRAVENOUS

## 2014-10-20 MED ORDER — ACETAMINOPHEN 325 MG PO TABS: 650.0000 mg | ORAL_TABLET | ORAL | Status: DC | PRN

## 2014-10-20 MED ORDER — ASPIRIN 81 MG PO CHEW
81.0000 mg | CHEWABLE_TABLET | Freq: Every day | ORAL | Status: DC
  Administered 2014-10-20 – 2014-10-21 (×2): 81 mg via ORAL
  Filled 2014-10-20 (×3): qty 1

## 2014-10-20 MED ORDER — METHOCARBAMOL 500 MG PO TABS
500.0000 mg | ORAL_TABLET | Freq: Four times a day (QID) | ORAL | Status: DC | PRN
  Administered 2014-10-20 – 2014-10-21 (×2): 500 mg via ORAL
  Filled 2014-10-20 (×2): qty 1

## 2014-10-20 MED ORDER — ROCURONIUM BROMIDE 100 MG/10ML IV SOLN
INTRAVENOUS | Status: DC | PRN
  Administered 2014-10-20: 50 mg via INTRAVENOUS

## 2014-10-20 MED ORDER — LOSARTAN POTASSIUM 50 MG PO TABS
50.0000 mg | ORAL_TABLET | Freq: Every day | ORAL | Status: DC
  Administered 2014-10-20 – 2014-10-21 (×2): 50 mg via ORAL
  Filled 2014-10-20 (×2): qty 1

## 2014-10-20 MED ORDER — VANCOMYCIN HCL 10 G IV SOLR
1500.0000 mg | Freq: Once | INTRAVENOUS | Status: AC
  Administered 2014-10-21: 1500 mg via INTRAVENOUS
  Filled 2014-10-20: qty 1500

## 2014-10-20 MED ORDER — 0.9 % SODIUM CHLORIDE (POUR BTL) OPTIME
TOPICAL | Status: DC | PRN
  Administered 2014-10-20: 1000 mL

## 2014-10-20 MED ORDER — MEPERIDINE HCL 25 MG/ML IJ SOLN: 6.2500 mg | INTRAMUSCULAR | Status: DC | PRN

## 2014-10-20 MED ORDER — MENTHOL 3 MG MT LOZG: 1.0000 | LOZENGE | OROMUCOSAL | Status: DC | PRN

## 2014-10-20 MED ORDER — PROPOFOL 10 MG/ML IV BOLUS
INTRAVENOUS | Status: AC
  Filled 2014-10-20: qty 20

## 2014-10-20 SURGICAL SUPPLY — 39 items
BAG DECANTER FOR FLEXI CONT (MISCELLANEOUS) ×4 IMPLANT
BENZOIN TINCTURE PRP APPL 2/3 (GAUZE/BANDAGES/DRESSINGS) IMPLANT
CANISTER SUCT 3000ML PPV (MISCELLANEOUS) ×2 IMPLANT
DRAPE LAPAROTOMY 100X72X124 (DRAPES) ×2 IMPLANT
DRAPE POUCH INSTRU U-SHP 10X18 (DRAPES) ×2 IMPLANT
DRSG OPSITE POSTOP 4X6 (GAUZE/BANDAGES/DRESSINGS) ×2 IMPLANT
DURAPREP 26ML APPLICATOR (WOUND CARE) IMPLANT
DURAPREP 6ML APPLICATOR 50/CS (WOUND CARE) IMPLANT
ELECT REM PT RETURN 9FT ADLT (ELECTROSURGICAL) ×2
ELECTRODE REM PT RTRN 9FT ADLT (ELECTROSURGICAL) ×1 IMPLANT
GAUZE SPONGE 4X4 16PLY XRAY LF (GAUZE/BANDAGES/DRESSINGS) IMPLANT
GLOVE BIO SURGEON STRL SZ7 (GLOVE) ×2 IMPLANT
GLOVE BIO SURGEON STRL SZ8 (GLOVE) ×4 IMPLANT
GLOVE INDICATOR 7.5 STRL GRN (GLOVE) ×2 IMPLANT
GOWN STRL REUS W/ TWL LRG LVL3 (GOWN DISPOSABLE) ×1 IMPLANT
GOWN STRL REUS W/ TWL XL LVL3 (GOWN DISPOSABLE) ×2 IMPLANT
GOWN STRL REUS W/TWL 2XL LVL3 (GOWN DISPOSABLE) IMPLANT
GOWN STRL REUS W/TWL LRG LVL3 (GOWN DISPOSABLE) ×1
GOWN STRL REUS W/TWL XL LVL3 (GOWN DISPOSABLE) ×2
KIT BASIN OR (CUSTOM PROCEDURE TRAY) ×2 IMPLANT
KIT ROOM TURNOVER OR (KITS) ×2 IMPLANT
NEEDLE HYPO 18GX1.5 BLUNT FILL (NEEDLE) IMPLANT
NEEDLE HYPO 25X1 1.5 SAFETY (NEEDLE) IMPLANT
NEEDLE SPNL 20GX3.5 QUINCKE YW (NEEDLE) IMPLANT
NS IRRIG 1000ML POUR BTL (IV SOLUTION) ×2 IMPLANT
PACK LAMINECTOMY NEURO (CUSTOM PROCEDURE TRAY) ×2 IMPLANT
PAD ARMBOARD 7.5X6 YLW CONV (MISCELLANEOUS) ×6 IMPLANT
STRIP CLOSURE SKIN 1/2X4 (GAUZE/BANDAGES/DRESSINGS) ×2 IMPLANT
SUT ETHILON 3 0 FSL (SUTURE) ×4 IMPLANT
SUT VIC AB 0 CT1 18XCR BRD8 (SUTURE) ×2 IMPLANT
SUT VIC AB 0 CT1 8-18 (SUTURE) ×2
SUT VIC AB 2-0 CP2 18 (SUTURE) ×2 IMPLANT
SUT VIC AB 3-0 SH 8-18 (SUTURE) ×4 IMPLANT
SWAB COLLECTION DEVICE MRSA (MISCELLANEOUS) ×4 IMPLANT
SYR 3ML LL SCALE MARK (SYRINGE) IMPLANT
TOWEL OR 17X24 6PK STRL BLUE (TOWEL DISPOSABLE) ×2 IMPLANT
TOWEL OR 17X26 10 PK STRL BLUE (TOWEL DISPOSABLE) ×2 IMPLANT
TUBE ANAEROBIC SPECIMEN COL (MISCELLANEOUS) ×4 IMPLANT
WATER STERILE IRR 1000ML POUR (IV SOLUTION) ×2 IMPLANT

## 2014-10-20 NOTE — H&P (Signed)
Subjective: Patient is a 69 y.o. female admitted for lumbar wound dehiscence. Onset of symptoms was 2 weeks ago, progressive since that time.  The pain is rated moderate, and is located at the back. No fevers or chills. The pain is described as aching and occurs intermittently. Symptoms are exacerbated by activity. She was found to have a wound dehiscence on routine post surgical follow up.   Past Medical History  Diagnosis Date  . Arthritis   . Hyperlipidemia   . Hypertension   . Fibromyalgia   . Diabetes mellitus without complication (HCC)     Type II  . Shortness of breath dyspnea     with exertion  . GERD (gastroesophageal reflux disease)     better after gall bladder surgery-     Past Surgical History  Procedure Laterality Date  . Abdominal hysterectomy  1997  . Cholecystectomy  2010  . Cataract extraction Bilateral 2014  . Colonoscopy    . Maximum access (mas)posterior lumbar interbody fusion (plif) 1 level N/A 09/16/2014    Procedure: L/4-5 FOR MAXIMUM ACCESS (MAS) POSTERIOR LUMBAR INTERBODY FUSION (PLIF) 1 LEVEL;  Surgeon: Tia Alert, MD;  Location: [facility];  Service: Neurosurgery;  Laterality: N/A;  L/4-5 FOR MAXIMUM ACCESS (MAS) POSTERIOR LUMBAR INTERBODY FUSION (PLIF) 1 LEVEL  . Eye surgery Bilateral     Cataract    Prior to Admission medications   Medication Sig Start Date End Date Taking? Authorizing Provider  amLODipine (NORVASC) 10 MG tablet TAKE 1 TABLET BY MOUTH DAILY Patient taking differently: TAKE 10 MG BY MOUTH DAILY 06/14/14  Yes Lorre Munroe, NP  aspirin 81 MG tablet Take 81 mg by mouth daily.   Yes Historical Provider, MD  atorvastatin (LIPITOR) 20 MG tablet TAKE 1 TABLET BY MOUTH DAILY Patient taking differently: TAKE 20 MG BY MOUTH DAILY 06/14/14  Yes Lorre Munroe, NP  docusate sodium (COLACE) 100 MG capsule Take 100 mg by mouth daily as needed for mild constipation.   Yes Historical Provider, MD  furosemide (LASIX) 20 MG tablet Take 1 tablet (20  mg total) by mouth daily as needed. Patient taking differently: Take 20 mg by mouth daily as needed for fluid.  09/01/14  Yes Lorre Munroe, NP  glimepiride (AMARYL) 4 MG tablet Take 1 tablet (4 mg total) by mouth daily with breakfast. 03/15/14  Yes Lorre Munroe, NP  insulin glargine (LANTUS) 100 UNIT/ML injection Inject 40 Units into the skin at bedtime.   Yes Historical Provider, MD  losartan (COZAAR) 50 MG tablet Take 1 tablet (50 mg total) by mouth daily. 03/15/14  Yes Lorre Munroe, NP  methocarbamol (ROBAXIN) 500 MG tablet Take 1.5 tablets (750 mg total) by mouth every 6 (six) hours as needed for muscle spasms. Patient taking differently: Take 500 mg by mouth every 6 (six) hours as needed for muscle spasms.  09/18/14  Yes Loura Halt Ditty, MD  naproxen sodium (ANAPROX) 220 MG tablet Take 220 mg by mouth as needed (for pain).    Yes Historical Provider, MD  Omega-3 Fatty Acids (FISH OIL PO) Take 1 capsule by mouth daily.   Yes Historical Provider, MD  oxyCODONE-acetaminophen (PERCOCET/ROXICET) 5-325 MG per tablet Take 1-2 tablets by mouth every 4 (four) hours as needed for moderate pain. 09/18/14  Yes Loura Halt Ditty, MD   No Known Allergies  Social History  Substance Use Topics  . Smoking status: Never Smoker   . Smokeless tobacco: Never Used  . Alcohol  Use: No    Family History  Problem Relation Age of Onset  . Arthritis Mother   . Cancer Mother     Ovarian  . Diabetes Mother   . Heart disease Father   . Hypertension Father      Review of Systems  Positive ROS: neg  All other systems have been reviewed and were otherwise negative with the exception of those mentioned in the HPI and as above.  Objective: Vital signs in last 24 hours:    General Appearance: Alert, cooperative, no distress, appears stated age Head: Normocephalic, without obvious abnormality, atraumatic Eyes: PERRL, conjunctiva/corneas clear, EOM's intact    Neck: Supple, symmetrical, trachea  midline Back: Symmetric, no curvature, ROM normal, no CVA tenderness Lungs:  respirations unlabored Heart: Regular rate and rhythm Abdomen: Soft, non-tender Extremities: Extremities normal, atraumatic, no cyanosis or edema Pulses: 2+ and symmetric all extremities Skin: Skin color, texture, turgor normal, no rashes or lesions  NEUROLOGIC:   Mental status: Alert and oriented x4,  no aphasia, good attention span, fund of knowledge, and memory Motor Exam - grossly normal Sensory Exam - grossly normal Reflexes: 1+ Coordination - grossly normal Gait - grossly normal Balance - grossly normal Cranial Nerves: I: smell Not tested  II: visual acuity  OS: nl    OD: nl  II: visual fields Full to confrontation  II: pupils Equal, round, reactive to light  III,VII: ptosis None  III,IV,VI: extraocular muscles  Full ROM  V: mastication Normal  V: facial light touch sensation  Normal  V,VII: corneal reflex  Present  VII: facial muscle function - upper  Normal  VII: facial muscle function - lower Normal  VIII: hearing Not tested  IX: soft palate elevation  Normal  IX,X: gag reflex Present  XI: trapezius strength  5/5  XI: sternocleidomastoid strength 5/5  XI: neck flexion strength  5/5  XII: tongue strength  Normal    Data Review Lab Results  Component Value Date   WBC 7.1 09/06/2014   HGB 11.8* 09/06/2014   HCT 36.5 09/06/2014   MCV 80.9 09/06/2014   PLT 352 09/06/2014   Lab Results  Component Value Date   NA 140 09/06/2014   K 3.6 09/06/2014   CL 106 09/06/2014   CO2 26 09/06/2014   BUN 13 09/06/2014   CREATININE 1.05* 09/06/2014   GLUCOSE 216* 09/06/2014   Lab Results  Component Value Date   INR 1.14 09/06/2014    Assessment/Plan: Patient admitted for wound revision. Patient has failed a reasonable attempt at conservative therapy.  I explained the condition and procedure to the patient and answered any questions.  Patient wishes to proceed with procedure as planned.  Understands risks/ benefits and typical outcomes of procedure.   JONES,DAVID S 10/20/2014 6:53 AM

## 2014-10-20 NOTE — Progress Notes (Signed)
IV team present to place PICC line. Pt has ambulated to bathroom and urinated. Lawson Radar

## 2014-10-20 NOTE — Plan of Care (Signed)
Problem: Consults Goal: Diagnosis - Spinal Surgery Outcome: Completed/Met Date Met:  10/20/14 Thoraco/Lumbar Spine Fusion     

## 2014-10-20 NOTE — Progress Notes (Signed)
CBG on adm 50.oral Sprite 220 cc

## 2014-10-20 NOTE — Op Note (Signed)
10/20/2014  1:25 PM  PATIENT:  Bethany [name]  69 y.o. female  PRE-OPERATIVE DIAGNOSIS:  Postoperative wound dehiscence  POST-OPERATIVE DIAGNOSIS:  Same  PROCEDURE:  Irrigation and debridement of lumbar wound  SURGEON:  Marikay Alar, MD  ASSISTANTS: None  ANESTHESIA:   General  EBL: 25 ml  Total I/O In: 800 [I.V.:800] Out: 25 [Blood:25]  BLOOD ADMINISTERED:none  DRAINS: None   SPECIMEN:  No Specimen  INDICATION FOR PROCEDURE: This patient underwent a lumbar fusion a few weeks ago. She presented for her routine postoperative follow-up and was found to have a wound dehiscence. Because of her obesity and her diabetes [place] this was important to manage this aggressively with primary wound revision. We decided to culture the wound to make sure there was no secondary infection since it was somewhat open. Patient understood the risks, benefits, and alternatives and potential outcomes and wished to proceed.  PROCEDURE DETAILS: The patient was taken to the operating room and after induction of adequate general as intake anesthesia she was rolled into the prone position on the Wilson frame and pressure points were padded. Her lumbar region was cleaned with Betadine scrub and then draped in usual sterile fashion. I ellipsed out the skin edges around the incision to get down to nice healthy bleeding tissue. Her adipose tissue was about 3 inches deep down to the fascia. I continued to remove and debride thickened adipose tissue all the way down to the fascia. I did not go beneath the fascia. I debrided the tissue back to healthy bleeding tissues. I irrigated with a couple of liters of bacitracin. Any saline solution. I cultured the wound. I then closed the 17th tissues and about 3 layers of 0 and 2-0 Vicryl. I closed the subcuticular tissues with 3-0 Vicryl closed the skin with a running 4-0 Ethilon suture. A sterile dressing was then applied and the patient was awakened from general anesthesia and  transferred in stable condition. At the procedure all sponge needle and his workouts are correct.  PLAN OF CARE: Admit to inpatient   PATIENT DISPOSITION:  PACU - hemodynamically stable.   Delay start of Pharmacological VTE agent (>24hrs) due to surgical blood loss or risk of bleeding:  yes

## 2014-10-20 NOTE — Progress Notes (Signed)
Consent obtained for PICC placement.  Family at bedside.  Pt currently uncomfortable and restless in the bed.  States would prefer to have PICC placed in AM after pain meds given.  Spoke with staff RN, Herbert Seta, regarding this plan.  PICC RN will notify in the am prior to arrival so pain meds can be administered.  All questions answered from pt and family.

## 2014-10-20 NOTE — Anesthesia Postprocedure Evaluation (Signed)
Anesthesia Post Note  Patient: Bethany [name]  Procedure(s) Performed: Procedure(s) (LRB): LUMBAR WOUND DEBRIDEMENT (N/A)  Anesthesia type: general  Patient location: PACU  Post pain: Pain level controlled  Post assessment: Patient's Cardiovascular Status Stable  Last Vitals:  Filed Vitals:   10/20/14 1442  BP:   Pulse:   Temp: 36.7 C  Resp:     Post vital signs: Reviewed and stable  Level of consciousness: sedated  Complications: No apparent anesthesia complications

## 2014-10-20 NOTE — Anesthesia Procedure Notes (Signed)
Procedure Name: Intubation Date/Time: 10/20/2014 12:04 PM Performed by: Eligha Bridegroom Pre-anesthesia Checklist: Patient identified, Emergency Drugs available, Suction available, Patient being monitored and Timeout performed Patient Re-evaluated:Patient Re-evaluated prior to inductionOxygen Delivery Method: Circle system utilized Preoxygenation: Pre-oxygenation with 100% oxygen Intubation Type: IV induction Ventilation: Mask ventilation without difficulty and Oral airway inserted - appropriate to patient size Laryngoscope Size: Mac and 4 Grade View: Grade II Tube type: Oral Tube size: 7.5 mm Number of attempts: 1 Airway Equipment and Method: LTA kit utilized and Stylet Placement Confirmation: ETT inserted through vocal cords under direct vision Secured at: 22 cm Tube secured with: Tape Dental Injury: Teeth and Oropharynx as per pre-operative assessment

## 2014-10-20 NOTE — Progress Notes (Signed)
ANTIBIOTIC CONSULT NOTE - INITIAL  Pharmacy Consult for Vancomycin Indication: surgical prophylaxis  No Known Allergies  Patient Measurements: Height:  (170.2 cm) Weight: 280 lb (127.007 kg) IBW/kg (Calculated) : 61.6 Adjusted Body Weight:   Vital Signs: Temp: 98 F (36.7 C) (10/06 1442) Temp Source: Oral (10/06 0956) BP: 118/55 mmHg (10/06 1416) Pulse Rate: 85 (10/06 1416) Intake/Output from previous day:   Intake/Output from this shift: Total I/O In: 900 [I.V.:900] Out: 25 [Blood:25]  Labs:  Recent Labs  10/20/14 1039  WBC 6.3  HGB 9.9*  PLT 318  CREATININE 1.01*   Estimated Creatinine Clearance: 70.8 mL/min (by C-G formula based on Cr of 1.01). No results for input(s): VANCOTROUGH, VANCOPEAK, VANCORANDOM, GENTTROUGH, GENTPEAK, GENTRANDOM, TOBRATROUGH, TOBRAPEAK, TOBRARND, AMIKACINPEAK, AMIKACINTROU, AMIKACIN in the last 72 hours.   Microbiology: No results found for this or any previous visit (from the past 720 hour(s)).  Medical History: Past Medical History  Diagnosis Date  . Arthritis   . Hyperlipidemia   . Hypertension   . Fibromyalgia   . Diabetes mellitus without complication (HCC)     Type II  . Shortness of breath dyspnea     with exertion  . GERD (gastroesophageal reflux disease)     better after gall bladder surgery-     Medications:  Prescriptions prior to admission  Medication Sig Dispense Refill Last Dose  . amLODipine (NORVASC) 10 MG tablet TAKE 1 TABLET BY MOUTH DAILY (Patient taking differently: TAKE 10 MG BY MOUTH DAILY) 30 tablet 2 10/20/2014 at 0730  . aspirin 81 MG tablet Take 81 mg by mouth daily.   10/16/2014  . atorvastatin (LIPITOR) 20 MG tablet TAKE 1 TABLET BY MOUTH DAILY (Patient taking differently: TAKE 20 MG BY MOUTH DAILY) 90 tablet 0 10/19/2014 at Unknown time  . docusate sodium (COLACE) 100 MG capsule Take 100 mg by mouth daily as needed for mild constipation.   Past Week at Unknown time  . furosemide (LASIX) 20 MG  tablet Take 1 tablet (20 mg total) by mouth daily as needed. (Patient taking differently: Take 20 mg by mouth daily as needed for fluid. ) 30 tablet 2 10/19/2014 at Unknown time  . glimepiride (AMARYL) 4 MG tablet Take 1 tablet (4 mg total) by mouth daily with breakfast. 90 tablet 0 10/19/2014 at Unknown time  . insulin glargine (LANTUS) 100 UNIT/ML injection Inject 40 Units into the skin at bedtime.   10/19/2014 at Unknown time  . losartan (COZAAR) 50 MG tablet Take 1 tablet (50 mg total) by mouth daily. 90 tablet 0 10/19/2014 at Unknown time  . methocarbamol (ROBAXIN) 500 MG tablet Take 1.5 tablets (750 mg total) by mouth every 6 (six) hours as needed for muscle spasms. (Patient taking differently: Take 500 mg by mouth every 6 (six) hours as needed for muscle spasms. ) 120 tablet 1 10/19/2014 at Unknown time  . naproxen sodium (ANAPROX) 220 MG tablet Take 220 mg by mouth as needed (for pain).    09/15/2014 at Unknown time  . Omega-3 Fatty Acids (FISH OIL PO) Take 1 capsule by mouth daily.   Past Week at Unknown time  . oxyCODONE-acetaminophen (PERCOCET/ROXICET) 5-325 MG per tablet Take 1-2 tablets by mouth every 4 (four) hours as needed for moderate pain. 90 tablet 0 10/19/2014 at Unknown time   Scheduled:  . amLODipine  10 mg Oral Daily  . aspirin  81 mg Oral Daily  . docusate sodium  100 mg Oral BID  . [START ON 10/21/2014]  glimepiride  4 mg Oral Q breakfast  . losartan  50 mg Oral Daily  . sodium chloride  3 mL Intravenous Q12H  . vancomycin       Infusions:  69yo sodium chloride    . 0.9 % NaCl with KCl 20 mEq / L     Assessment: 69yo female here for I&D of lumbar wound. Pharmacy is consulted to dose vancomycin for surgical prophylaxis. Pt is afebrile, WBC wnl, sCr 1.01 and does not have a drain in place.  Goal of Therapy:  Prevention of infection  Plan:  Vancomycin  IV once 12 hours post-op Monitor clinical course  Arlean Hopping. Newman Pies, PharmD Clinical Pharmacist Pager  [phone] 10/20/2014,3:16 PM

## 2014-10-20 NOTE — Transfer of Care (Signed)
Immediate Anesthesia Transfer of Care Note  Patient: Bethany [name]  Procedure(s) Performed: Procedure(s): LUMBAR WOUND DEBRIDEMENT (N/A)  Patient Location: PACU  Anesthesia Type:General  Level of Consciousness: awake, alert  and oriented  Airway & Oxygen Therapy: Patient Spontanous Breathing and Patient connected to nasal cannula oxygen  Post-op Assessment: Report given to RN and Post -op Vital signs reviewed and stable  Post vital signs: Reviewed and stable  Last Vitals:  Filed Vitals:   10/20/14 0956  BP: 148/57  Pulse: 65  Temp: 36.9 C  Resp: 18    Complications: No apparent anesthesia complications

## 2014-10-21 ENCOUNTER — Encounter (HOSPITAL_COMMUNITY): Payer: Self-pay | Admitting: Neurological Surgery

## 2014-10-21 MED ORDER — OXYCODONE-ACETAMINOPHEN 5-325 MG PO TABS: 1.0000 | ORAL_TABLET | ORAL | Status: DC | PRN

## 2014-10-21 MED ORDER — VANCOMYCIN HCL IN DEXTROSE 1-5 GM/200ML-% IV SOLN
1000.0000 mg | Freq: Once | INTRAVENOUS | Status: AC
  Administered 2014-10-21: 1000 mg via INTRAVENOUS
  Filled 2014-10-21: qty 200

## 2014-10-21 MED ORDER — SODIUM CHLORIDE 0.9 % IJ SOLN: 10.0000 mL | INTRAMUSCULAR | Status: DC | PRN

## 2014-10-21 MED ORDER — HEPARIN SOD (PORK) LOCK FLUSH 100 UNIT/ML IV SOLN
250.0000 [IU] | INTRAVENOUS | Status: AC | PRN
  Administered 2014-10-21: 250 [IU]

## 2014-10-21 NOTE — Care Management Note (Signed)
Case Management Note  Patient Details  Name: Bethany [name] MRN: [id number] Date of Birth: [birth year]  Subjective/Objective:                    Action/Plan: Patient being discharged today with IV antibiotic therapy. CM met with the patient and provided her with a list of home health agencies in her area. Pt chose [facility]. Pam with IV infusion therapy at [facility] notified and Tiffany with [facility] notified and both accepted the referral. Bedside RN updated. Rolling walker ordered per MD not needed per OT and PT recommendations.  Expected Discharge Date:                  Expected Discharge Plan:  [place]  In-House Referral:     Discharge planning Services  CM Consult  Post Acute Care Choice:    Choice offered to:     DME Arranged:    DME Agency:     HH Arranged:  RN, IV Antibiotics HH Agency:  [place]  Status of Service:  Completed, signed off  Medicare Important Message Given:    Date Medicare IM Given:    Medicare IM give by:    Date Additional Medicare IM Given:    Additional Medicare Important Message give by:     If discussed at [place] of Stay Meetings, dates discussed:    Additional Comments:  Pollie Friar, RN 10/21/2014, 11:58 AM

## 2014-10-21 NOTE — Progress Notes (Signed)
Pt left unit with Charge nurse in wheelchair at 1800. Discharge instructions given. Lawson Radar

## 2014-10-21 NOTE — Progress Notes (Signed)
Peripherally Inserted Central Catheter/Midline Placement  The IV Nurse has discussed with the patient and/or persons authorized to consent for the patient, the purpose of this procedure and the potential benefits and risks involved with this procedure.  The benefits include less needle sticks, lab draws from the catheter and patient may be discharged home with the catheter.  Risks include, but not limited to, infection, bleeding, blood clot (thrombus formation), and puncture of an artery; nerve damage and irregular heat beat.  Alternatives to this procedure were also discussed.  PICC/Midline Placement Documentation  PICC / Midline Single Lumen 10/21/14 PICC Right Basilic 40 cm 0 cm (Active)  Indication for Insertion or Continuance of Line Home intravenous therapies (PICC only) 10/21/2014  8:48 AM  Exposed Catheter (cm) 0 cm 10/21/2014  8:48 AM  Dressing Change Due 10/28/14 10/21/2014  8:48 AM       Bethany [name] 10/21/2014, 8:49 AM

## 2014-10-21 NOTE — Progress Notes (Signed)
PT Cancellation and Discharge Note  Patient Details Name: Bethany [name] MRN: [id number] DOB: [birth year]   Cancelled Treatment:    Reason Eval/Treat Not Completed: PT screened, no needs identified, will sign off.  Pt observed up independently in her room and demonstrating safety with mobility.  Pt indicates having all needed DME and support from family at home.  Discussed continued mobility and home safety with pt and daughter-in-law.  No need for formal PT eval at this time and will sign off.     Sunny Schlein, [place] [phone] 10/21/2014, 10:53 AM

## 2014-10-21 NOTE — Progress Notes (Signed)
OT Cancellation Note  Patient Details Name: Bethany [name] MRN: [id number] DOB: [birth year]   Cancelled Treatment:    Reason Eval/Treat Not Completed: OT screened, no needs identified, will sign off. Pt reports that she just had back surgery a few weeks ago and already has all the AE and AD that she will need upon return home. She reports that she has 24/7 assist and is comfortable with completing ADLs and functional mobility upon d/c, has already been up walking to bathroom in her room. Pt has no further questions or concerns for OT at this time. Signing off from an OT standpoint. Please re-consult if change in medical status occurs. Thank you for this referral.  Gaye Alken M.S., OTR/L Pager: [phone]  10/21/2014, 10:18 AM

## 2014-10-21 NOTE — Care Management Note (Signed)
Case Management Note  Patient Details  Name: Bethany DELPHiam MRN: [id number] Date of Birth: Jun 25, 1945  Subjective/Objective:                    Action/Plan: Patient admitted with wound dehiscence. Pt underwent surgical debridement. Pt lives at home with family and states she has 24/7 care. Awaiting PT recommendations for discharge disposition. CM will continue to follow for possible home health antibiotics at discharge and other discharge needs.   Expected Discharge Date:                  Expected Discharge Plan:  Home/Self Care  In-House Referral:     Discharge planning Services  CM Consult  Post Acute Care Choice:    Choice offered to:     DME Arranged:    DME Agency:     HH Arranged:    HH Agency:     Status of Service:  In process, will continue to follow  Medicare Important Message Given:    Date Medicare IM Given:    Medicare IM give by:    Date Additional Medicare IM Given:    Additional Medicare Important Message give by:     If discussed at [place] of Stay Meetings, dates discussed:    Additional Comments:  Kermit Balo, RN 10/21/2014, 10:58 AM

## 2014-10-21 NOTE — Discharge Summary (Signed)
Physician Discharge Summary  Patient ID: Bethany [name] MRN: [id number] DOB/AGE: 1946/01/12 69 y.o.  Admit date: 10/20/2014 Discharge date: 10/21/2014  Admission Diagnoses: Lumbar wound dehiscence    Discharge Diagnoses: Same   Discharged Condition: good  Hospital Course: The patient was admitted on 10/20/2014 and taken to the operating room where the patient underwent lumbar wound revision with irrigation and debridement and primary closure. The patient tolerated the procedure well and was taken to the recovery room and then to the floor in stable condition. The hospital course was routine. There were no complications. The wound remained clean dry and intact. Pt had appropriate back soreness. No complaints of leg pain or new N/T/W. The patient remained afebrile with stable vital signs, and tolerated a regular diet. The patient continued to increase activities, and pain was well controlled with oral pain medications. She will be discharged home with home health with home IV antibiotics for 4 weeks. At the time of discharge her cultures were negative but certainly she is at risk of secondary infection because of the dehiscence.  Consults: None  Significant Diagnostic Studies:  Results for orders placed or performed during the hospital encounter of 10/20/14  Anaerobic culture  Result Value Ref Range   Specimen Description WOUND    Special Requests LUMBAR WOUND    Gram Stain      ABUNDANT WBC PRESENT,BOTH PMN AND MONONUCLEAR NO SQUAMOUS EPITHELIAL CELLS SEEN NO ORGANISMS SEEN Performed at [facility]    Culture PENDING    Report Status PENDING   Wound culture  Result Value Ref Range   Specimen Description WOUND    Special Requests LUMBAR WOUND    Gram Stain      ABUNDANT WBC PRESENT,BOTH PMN AND MONONUCLEAR NO SQUAMOUS EPITHELIAL CELLS SEEN NO ORGANISMS SEEN Performed at [facility]    Culture      NO GROWTH 1 DAY Performed at [facility]    Report  Status PENDING   Basic metabolic panel  Result Value Ref Range   Sodium 141 135 - 145 mmol/L   Potassium 3.4 (L) 3.5 - 5.1 mmol/L   Chloride 105 101 - 111 mmol/L   CO2 28 22 - 32 mmol/L   Glucose, Bld 93 65 - 99 mg/dL   BUN 12 6 - 20 mg/dL   Creatinine, Ser 4.09 (H) 0.44 - 1.00 mg/dL   Calcium 8.7 (L) 8.9 - 10.3 mg/dL   GFR calc non Af Amer 55 (L) >60 mL/min   GFR calc Af Amer >60 >60 mL/min   Anion gap 8 5 - 15  CBC  Result Value Ref Range   WBC 6.3 4.0 - 10.5 K/uL   RBC 3.86 (L) 3.87 - 5.11 MIL/uL   Hemoglobin 9.9 (L) 12.0 - 15.0 g/dL   HCT 81.1 (L) 91.4 - 78.2 %   MCV 79.5 78.0 - 100.0 fL   MCH 25.6 (L) 26.0 - 34.0 pg   MCHC 32.2 30.0 - 36.0 g/dL   RDW 95.6 21.3 - 08.6 %   Platelets 318 150 - 400 K/uL  Glucose, capillary  Result Value Ref Range   Glucose-Capillary 97 65 - 99 mg/dL  Glucose, capillary  Result Value Ref Range   Glucose-Capillary 50 (L) 65 - 99 mg/dL  Glucose, capillary  Result Value Ref Range   Glucose-Capillary 60 (L) 65 - 99 mg/dL  Glucose, capillary  Result Value Ref Range   Glucose-Capillary 86 65 - 99 mg/dL    No results found.  Antibiotics:  Anti-infectives    Start     Dose/Rate Route Frequency Ordered Stop   10/21/14 0100  vancomycin (VANCOCIN) 1,500 mg in sodium chloride 0.9 % 500 mL IVPB     1,500 mg 250 mL/hr over 120 Minutes Intravenous  Once 10/20/14 1520 10/21/14 0234   10/20/14 1230  vancomycin (VANCOCIN) powder  Status:  Discontinued       As needed 10/20/14 1302 10/20/14 1312   10/20/14 1228  vancomycin (VANCOCIN) 1000 MG powder    Comments:  Karmen Stabs   : cabinet override      10/20/14 1228 10/21/14 0044   10/20/14 1130  ceFAZolin (ANCEF) 3 g in dextrose 5 % 50 mL IVPB     3 g 160 mL/hr over 30 Minutes Intravenous To [facility] 10/20/14 0710 10/20/14 1238   10/20/14 1120  bacitracin 50,000 Units in sodium chloride irrigation 0.9 % 500 mL irrigation  Status:  Discontinued       As needed 10/20/14 1300 10/20/14  1312      Discharge Exam: Blood pressure 132/59, pulse 82, temperature 97.9 F (36.6 C), temperature source Axillary, resp. rate 20, height  (1.702 m), weight 280 lb (127.007 kg), SpO2 100 %. Neurologic: Grossly normal Incision clean dry and intact  Discharge Medications:     Medication List    TAKE these medications        amLODipine 10 MG tablet  Commonly known as:  NORVASC  TAKE 1 TABLET BY MOUTH DAILY     aspirin 81 MG tablet  Take 81 mg by mouth daily.     atorvastatin 20 MG tablet  Commonly known as:  LIPITOR  TAKE 1 TABLET BY MOUTH DAILY     docusate sodium 100 MG capsule  Commonly known as:  COLACE  Take 100 mg by mouth daily as needed for mild constipation.     FISH OIL PO  Take 1 capsule by mouth daily.     furosemide 20 MG tablet  Commonly known as:  LASIX  Take 1 tablet (20 mg total) by mouth daily as needed.     glimepiride 4 MG tablet  Commonly known as:  AMARYL  Take 1 tablet (4 mg total) by mouth daily with breakfast.     insulin glargine 100 UNIT/ML injection  Commonly known as:  LANTUS  Inject 40 Units into the skin at bedtime.     losartan 50 MG tablet  Commonly known as:  COZAAR  Take 1 tablet (50 mg total) by mouth daily.     methocarbamol 500 MG tablet  Commonly known as:  ROBAXIN  Take 1.5 tablets (750 mg total) by mouth every 6 (six) hours as needed for muscle spasms.     naproxen sodium 220 MG tablet  Commonly known as:  ANAPROX  Take 220 mg by mouth as needed (for pain).     oxyCODONE-acetaminophen 5-325 MG tablet  Commonly known as:  PERCOCET/ROXICET  Take 1-2 tablets by mouth every 4 (four) hours as needed for moderate pain.        Disposition: Home   Final Dx: Lumbar wound revision and primary closure      Discharge Instructions     Remove dressing in 72 hours    Complete by:  As directed      Call MD for:  difficulty breathing, headache or visual disturbances    Complete by:  As directed      Call MD for:   persistant nausea and  vomiting    Complete by:  As directed      Call MD for:  redness, tenderness, or signs of infection (pain, swelling, redness, odor or green/yellow discharge around incision site)    Complete by:  As directed      Call MD for:  severe uncontrolled pain    Complete by:  As directed      Call MD for:  temperature >100.4    Complete by:  As directed      Diet - low sodium heart healthy    Complete by:  As directed      Discharge instructions    Complete by:  As directed   May shower, no heavy lifting or strenuous activity     Increase activity slowly    Complete by:  As directed            Follow-up Information    Follow up with Jyron Turman S, MD. Schedule an appointment as soon as possible for a visit in 2 weeks.   Specialty:  Neurosurgery   Contact information:   [address] [place] [address] [phone]        Signed: Tia Alert 10/21/2014, 12:56 PM

## 2014-10-22 DIAGNOSIS — E119 Type 2 diabetes mellitus without complications: Secondary | ICD-10-CM | POA: Diagnosis not present

## 2014-10-22 DIAGNOSIS — T8131XA Disruption of external operation (surgical) wound, not elsewhere classified, initial encounter: Secondary | ICD-10-CM | POA: Diagnosis not present

## 2014-10-22 DIAGNOSIS — R269 Unspecified abnormalities of gait and mobility: Secondary | ICD-10-CM | POA: Diagnosis not present

## 2014-10-23 LAB — WOUND CULTURE

## 2014-10-24 DIAGNOSIS — Z452 Encounter for adjustment and management of vascular access device: Secondary | ICD-10-CM | POA: Diagnosis not present

## 2014-10-24 DIAGNOSIS — Z5181 Encounter for therapeutic drug level monitoring: Secondary | ICD-10-CM | POA: Diagnosis not present

## 2014-10-24 DIAGNOSIS — T8131XA Disruption of external operation (surgical) wound, not elsewhere classified, initial encounter: Secondary | ICD-10-CM | POA: Diagnosis not present

## 2014-10-25 ENCOUNTER — Other Ambulatory Visit: Payer: Self-pay | Admitting: Internal Medicine

## 2014-10-25 LAB — ANAEROBIC CULTURE

## 2014-10-27 DIAGNOSIS — Z5181 Encounter for therapeutic drug level monitoring: Secondary | ICD-10-CM | POA: Diagnosis not present

## 2014-10-27 DIAGNOSIS — T8131XA Disruption of external operation (surgical) wound, not elsewhere classified, initial encounter: Secondary | ICD-10-CM | POA: Diagnosis not present

## 2014-10-27 DIAGNOSIS — Z452 Encounter for adjustment and management of vascular access device: Secondary | ICD-10-CM | POA: Diagnosis not present

## 2014-10-29 DIAGNOSIS — E119 Type 2 diabetes mellitus without complications: Secondary | ICD-10-CM | POA: Diagnosis not present

## 2014-10-29 DIAGNOSIS — T8131XA Disruption of external operation (surgical) wound, not elsewhere classified, initial encounter: Secondary | ICD-10-CM | POA: Diagnosis not present

## 2014-10-29 DIAGNOSIS — R269 Unspecified abnormalities of gait and mobility: Secondary | ICD-10-CM | POA: Diagnosis not present

## 2014-10-31 DIAGNOSIS — Z452 Encounter for adjustment and management of vascular access device: Secondary | ICD-10-CM | POA: Diagnosis not present

## 2014-10-31 DIAGNOSIS — T8131XA Disruption of external operation (surgical) wound, not elsewhere classified, initial encounter: Secondary | ICD-10-CM | POA: Diagnosis not present

## 2014-11-03 ENCOUNTER — Ambulatory Visit (INDEPENDENT_AMBULATORY_CARE_PROVIDER_SITE_OTHER): Payer: Medicare Other | Admitting: Internal Medicine

## 2014-11-03 ENCOUNTER — Encounter: Payer: Self-pay | Admitting: Internal Medicine

## 2014-11-03 VITALS — BP 130/76 | HR 79 | Temp 97.9°F | Wt 280.0 lb

## 2014-11-03 DIAGNOSIS — E1165 Type 2 diabetes mellitus with hyperglycemia: Secondary | ICD-10-CM | POA: Diagnosis not present

## 2014-11-03 DIAGNOSIS — Z794 Long term (current) use of insulin: Secondary | ICD-10-CM

## 2014-11-03 NOTE — Patient Instructions (Signed)
How to Avoid Diabetes Problems  You can do a lot to prevent or slow down diabetes problems. Following your diabetes plan and taking care of yourself can reduce your risk of serious or life-threatening complications. Below, you will find certain things you can do to prevent diabetes problems.  MANAGE YOUR DIABETES  Follow your health care provider's, nurse educator's, and dietitian's instructions for managing your diabetes. They will teach you the basics of diabetes care. They can help answer questions you may have. Learn about diabetes and make healthy choices regarding eating and physical activity. Monitor your blood glucose level regularly. Your health care provider will help you decide how often to check your blood glucose level depending on your treatment goals and how well you are meeting them.   DO NOT USE NICOTINE  Nicotine and diabetes are a dangerous combination. Nicotine raises your risk for diabetes problems. If you quit using nicotine, you will lower your risk for heart attack, stroke, nerve disease, and kidney disease. Your cholesterol and your blood pressure levels may improve. Your blood circulation will also improve. Do not use any tobacco products, including cigarettes, chewing tobacco, or electronic cigarettes. If you need help quitting, ask your health care provider.  KEEP YOUR BLOOD PRESSURE UNDER CONTROL  Your health care provider will determine your individualized target blood pressure based on your age, your medicines, how long you have had diabetes, and any other medical conditions you have. Blood pressure consists of two numbers. Generally, the goal is to keep your top number (systolic pressure) at or below 130, and your bottom number (diastolic pressure) at or below 80. Your health care provider may recommend a lower target blood pressure reading, if appropriate. Meal planning, medicines, and exercise can help you reach your target blood pressure. Make sure your health care provider checks  your blood pressure at every visit.  KEEP YOUR CHOLESTEROL UNDER CONTROL  Normal cholesterol levels will help prevent heart disease and stroke. These are the biggest health problems for people with diabetes. Keeping cholesterol levels under control can also help with blood flow. Have your cholesterol level checked at least once a year. Your health care provider may prescribe a medicine known as a statin. Statins lower your cholesterol. If you are not taking a statin, ask your health care provider if you should be. Meal planning, exercise, and medicines can help you reach your cholesterol targets.   SCHEDULE AND KEEP YOUR ANNUAL PHYSICAL EXAMS AND EYE EXAMS  Your health care provider will tell you how often he or she wants to see you depending on your plan of treatment. It is important that you keep these appointments so that possible problems can be identified early and complications can be avoided or treated.  · Every visit with your health care provider should include your weight, blood pressure, and an evaluation of your blood glucose control.  · Your hemoglobin A1c should be checked:    At least twice a year if you are at your goal.    Every 3 months if there are changes in treatment.    If you are not meeting your goals.  · Your blood lipids should be checked yearly. You should also be checked yearly to see if you have protein in your urine (microalbumin).  · Schedule a dilated eye exam within 5 years of your diagnosis if you have type 1 diabetes, and then yearly. Schedule a dilated eye exam at diagnosis if you have type 2 diabetes, and then yearly. All   exams thereafter can be extended to every 2 to 3 years if one or more exams have been normal.  KEEP YOUR VACCINES CURRENT  It is recommended that you receive a flu (influenza) vaccine every year. It is also recommended that you receive a pneumonia (pneumococcal) vaccine. If you are 65 years of age or older and have never received a pneumonia vaccine, this  vaccine may be given as a series of two separate shots. Ask your health care provider which additional vaccines may be recommended.  TAKE CARE OF YOUR FEET   Diabetes may cause you to have a poor blood supply (circulation) to your legs and feet. Because of this, the skin may be thinner, break easier, and heal more slowly. You also may have nerve damage in your legs and feet, causing decreased feeling. You may not notice minor injuries to your feet that could lead to serious problems or infections. Taking care of your feet is very important.  Visual foot exams are performed at every routine medical visit. The exams check for cuts, injuries, or other problems with the feet. A comprehensive foot exam should be done yearly. This includes visual inspection as well as assessing foot pulses and testing for loss of sensation. You should also do the following:  · Inspect your feet daily for cuts, calluses, blisters, ingrown toenails, and signs of infection, such as redness, swelling, or pus.  · Wash and dry your feet thoroughly, especially between the toes.  · Avoid soaking your feet regularly in hot water baths.  · Moisturize dry skin with lotion, avoiding areas between your toes.  · Cut toenails straight across and file the edges.  · Avoid shoes that do not fit well or have areas that irritate your skin.  · Avoid going barefooted or wearing only socks. Your feet need protection.  TAKE CARE OF YOUR TEETH  People with poorly controlled diabetes are more likely to have gum (periodontal) disease. These infections make diabetes harder to control. Periodontal diseases, if left untreated, can lead to tooth loss. Brush your teeth twice a day, floss, and see your dentist for checkups and cleaning every 6 months, or 2 times a year.  ASK YOUR HEALTH CARE PROVIDER ABOUT TAKING ASPIRIN  Taking aspirin daily is recommended to help prevent cardiovascular disease in people with and without diabetes. Ask your health care provider if this  would benefit you and what dose he or she would recommend.  DRINK RESPONSIBLY  Moderate amounts of alcohol (less than 1 drink per day for adult women and less than 2 drinks per day for adult men) have a minimal effect on blood glucose if ingested with food. It is important to eat food with alcohol to avoid hypoglycemia. People should avoid alcohol if they have a history of alcohol abuse or dependence, if they are pregnant, and if they have liver disease, pancreatitis, advanced neuropathy, or severe hypertriglyceridemia.  LESSEN STRESS  Living with diabetes can be stressful. When you are under stress, your blood glucose may be affected in two ways:  · Stress hormones may cause your blood glucose to rise.  · You may be distracted from taking good care of yourself.  It is a good idea to be aware of your stress level and make changes that are necessary to help you better manage challenging situations. Support groups, planned relaxation, a hobby you enjoy, meditation, healthy relationships, and exercise all work to lower your stress level. If your efforts do not seem to be helping,   get help from your health care provider or a trained mental health professional.     This information is not intended to replace advice given to you by your health care provider. Make sure you discuss any questions you have with your health care provider.     Document Released: 09/18/2010 Document Revised: 01/21/2014 Document Reviewed: 02/24/2013  Elsevier Interactive Patient Education ©2016 [employer]

## 2014-11-03 NOTE — Assessment & Plan Note (Signed)
Will give order for home health nurse to check lipid profile and A1C No microalbumin as she is on ACEI/ARB therapy Encouraged her to make an appt for an eye exam Foot exam today She wants to get her flu shot at the pharmacy Will request previous records from Dr. Laqueta DueKahns office Continue Amaryl and Lantus

## 2014-11-03 NOTE — Progress Notes (Signed)
Subjective:    Patient ID: Bethany [name], female    DOB: Apr 25, 1945, 69 y.o.   MRN: [id number]  HPI  Pt presents to the clinic today for 3 month follow up DM 2: [name] was 9.7%. Her sugars range 50 - 240. She takes Amaryl and Lantus 40 units nightly. Her last eye exam was in 2015. Flu: 01/2014. She thinks she had her pneumonia vaccine at Dr. Milta DeitersKhan's office but is not positive. She has noticed some numbness in her feet. It is not interfering with her ability to walk.   She denies any other complaints today.  Review of Systems      Past Medical History  Diagnosis Date  . Arthritis   . Hyperlipidemia   . Hypertension   . Fibromyalgia   . Diabetes mellitus without complication (HCC)     Type II  . Shortness of breath dyspnea     with exertion  . GERD (gastroesophageal reflux disease)     better after gall bladder surgery-     Current Outpatient Prescriptions  Medication Sig Dispense Refill  . amLODipine (NORVASC) 10 MG tablet TAKE 1 TABLET BY MOUTH DAILY 30 tablet 0  . aspirin 81 MG tablet Take 81 mg by mouth daily.    Marland Kitchen. atorvastatin (LIPITOR) 20 MG tablet TAKE 1 TABLET BY MOUTH DAILY (Patient taking differently: TAKE 20 MG BY MOUTH DAILY) 90 tablet 0  . docusate sodium (COLACE) 100 MG capsule Take 100 mg by mouth daily as needed for mild constipation.    . furosemide (LASIX) 20 MG tablet Take 1 tablet (20 mg total) by mouth daily as needed. (Patient taking differently: Take 20 mg by mouth daily as needed for fluid. ) 30 tablet 2  . glimepiride (AMARYL) 4 MG tablet Take 1 tablet (4 mg total) by mouth daily with breakfast. 90 tablet 0  . insulin glargine (LANTUS) 100 UNIT/ML injection Inject 40 Units into the skin at bedtime.    Marland Kitchen. losartan (COZAAR) 50 MG tablet Take 1 tablet (50 mg total) by mouth daily. 90 tablet 0  . methocarbamol (ROBAXIN) 500 MG tablet Take 1.5 tablets (750 mg total) by mouth every 6 (six) hours as needed for muscle spasms. (Patient taking differently:  Take 500 mg by mouth every 6 (six) hours as needed for muscle spasms. ) 120 tablet 1  . naproxen sodium (ANAPROX) 220 MG tablet Take 220 mg by mouth as needed (for pain).     . Omega-3 Fatty Acids (FISH OIL PO) Take 1 capsule by mouth daily.    Marland Kitchen. oxyCODONE-acetaminophen (PERCOCET/ROXICET) 5-325 MG tablet Take 1-2 tablets by mouth every 4 (four) hours as needed for moderate pain. 90 tablet 0   No current facility-administered medications for this visit.    No Known Allergies  Family History  Problem Relation Age of Onset  . Arthritis Mother   . Cancer Mother     Ovarian  . Diabetes Mother   . Heart disease Father   . Hypertension Father     Social History   Social History  . Marital Status: Married    Spouse Name: N/A  . Number of Children: N/A  . Years of Education: N/A   Occupational History  . Not on file.   Social History Main Topics  . Smoking status: Never Smoker   . Smokeless tobacco: Never Used  . Alcohol Use: No  . Drug Use: No  . Sexual Activity: Not Currently   Other Topics Concern  .  Not on file   Social History Narrative     Constitutional: Denies fever, malaise, fatigue, headache or abrupt weight changes.  HEENT: Denies eye pain, eye redness, ear pain, ringing in the ears, wax buildup, runny nose, nasal congestion, bloody nose, or sore throat. Respiratory: Denies difficulty breathing, shortness of breath, cough or sputum production.   Cardiovascular: Denies chest pain, chest tightness, palpitations or swelling in the hands or feet.  Gastrointestinal: Denies abdominal pain, bloating, constipation, diarrhea or blood in the stool.  GU: Denies urgency, frequency, pain with urination, burning sensation, blood in urine, odor or discharge. Skin: Denies redness, rashes, lesions or ulcercations.  Neurological: Denies dizziness, difficulty with memory, difficulty with speech or problems with balance and coordination.    No other specific complaints in a  complete review of systems (except as listed in HPI above).  Objective:   Physical Exam   BP 130/76 mmHg  Pulse 79  Temp(Src) 97.9 F (36.6 C) (Oral)  Wt 280 lb (127.007 kg)  SpO2 99% Wt Readings from Last 3 Encounters:  11/03/14 280 lb (127.007 kg)  10/20/14 280 lb (127.007 kg)  09/06/14 290 lb (131.543 kg)    General: Appears her stated age, obese in NAD. Skin: Warm, dry and intact. No rashes, lesions or ulcerations noted. Single lumen PICC noted in right upper extremity. HEENT: Head: normal shape and size; Eyes: sclera white, no icterus, conjunctiva pink, PERRLA and EOMs intact;   Cardiovascular: Normal rate and rhythm. S1,S2 noted.  No murmur, rubs or gallops noted.  Pulmonary/Chest: Normal effort and positive vesicular breath sounds. No respiratory distress. No wheezes, rales or ronchi noted.  Neurological: Alert and oriented. Sensation intact to BLE.   BMET    Component Value Date/Time   NA 141 10/20/2014 1039   K 3.4* 10/20/2014 1039   CL 105 10/20/2014 1039   CO2 28 10/20/2014 1039   GLUCOSE 93 10/20/2014 1039   BUN 12 10/20/2014 1039   CREATININE 1.01* 10/20/2014 1039   CALCIUM 8.7* 10/20/2014 1039   GFRNONAA 55* 10/20/2014 1039   GFRAA >60 10/20/2014 1039    Lipid Panel     Component Value Date/Time   CHOL 120 05/06/2014 1448   TRIG 155.0* 05/06/2014 1448   HDL 38.10* 05/06/2014 1448   CHOLHDL 3 05/06/2014 1448   VLDL 31.0 05/06/2014 1448   LDLCALC 51 05/06/2014 1448    CBC    Component Value Date/Time   WBC 6.3 10/20/2014 1039   RBC 3.86* 10/20/2014 1039   HGB 9.9* 10/20/2014 1039   HCT 30.7* 10/20/2014 1039   PLT 318 10/20/2014 1039   MCV 79.5 10/20/2014 1039   MCH 25.6* 10/20/2014 1039   MCHC 32.2 10/20/2014 1039   RDW 15.5 10/20/2014 1039   LYMPHSABS 2.2 09/06/2014 1048   MONOABS 0.5 09/06/2014 1048   EOSABS 0.3 09/06/2014 1048   BASOSABS 0.0 09/06/2014 1048    Hgb A1C Lab Results  Component Value Date   HGBA1C 9.0* 08/01/2014         Assessment & Plan:

## 2014-11-03 NOTE — Progress Notes (Signed)
Pre visit review using our clinic review tool, if applicable. No additional management support is needed unless otherwise documented below in the visit note. 

## 2014-11-05 DIAGNOSIS — T8131XA Disruption of external operation (surgical) wound, not elsewhere classified, initial encounter: Secondary | ICD-10-CM | POA: Diagnosis not present

## 2014-11-05 DIAGNOSIS — E119 Type 2 diabetes mellitus without complications: Secondary | ICD-10-CM | POA: Diagnosis not present

## 2014-11-05 DIAGNOSIS — R269 Unspecified abnormalities of gait and mobility: Secondary | ICD-10-CM | POA: Diagnosis not present

## 2014-11-06 DIAGNOSIS — Z792 Long term (current) use of antibiotics: Secondary | ICD-10-CM | POA: Diagnosis not present

## 2014-11-06 DIAGNOSIS — T8131XA Disruption of external operation (surgical) wound, not elsewhere classified, initial encounter: Secondary | ICD-10-CM | POA: Diagnosis not present

## 2014-11-06 DIAGNOSIS — Z5181 Encounter for therapeutic drug level monitoring: Secondary | ICD-10-CM | POA: Diagnosis not present

## 2014-11-08 ENCOUNTER — Telehealth: Payer: Self-pay | Admitting: Internal Medicine

## 2014-11-08 NOTE — Telephone Encounter (Signed)
Left detailed msg on VM per HIPAA  

## 2014-11-08 NOTE — Telephone Encounter (Signed)
Lab results faxed from [facility]. A1C 8.3%. No change in medication therapy at this time. Lipids at goal.

## 2014-11-11 DIAGNOSIS — T8131XA Disruption of external operation (surgical) wound, not elsewhere classified, initial encounter: Secondary | ICD-10-CM | POA: Diagnosis not present

## 2014-11-27 ENCOUNTER — Other Ambulatory Visit: Payer: Self-pay | Admitting: Internal Medicine

## 2014-11-29 DIAGNOSIS — M4316 Spondylolisthesis, lumbar region: Secondary | ICD-10-CM | POA: Diagnosis not present

## 2015-02-02 ENCOUNTER — Other Ambulatory Visit: Payer: Self-pay | Admitting: Internal Medicine

## 2015-02-02 DIAGNOSIS — M7731 Calcaneal spur, right foot: Secondary | ICD-10-CM | POA: Diagnosis not present

## 2015-02-02 DIAGNOSIS — E119 Type 2 diabetes mellitus without complications: Secondary | ICD-10-CM | POA: Diagnosis not present

## 2015-02-02 DIAGNOSIS — M722 Plantar fascial fibromatosis: Secondary | ICD-10-CM | POA: Diagnosis not present

## 2015-02-02 DIAGNOSIS — M79671 Pain in right foot: Secondary | ICD-10-CM | POA: Diagnosis not present

## 2015-02-06 ENCOUNTER — Ambulatory Visit: Payer: Medicare Other | Admitting: Internal Medicine

## 2015-02-07 ENCOUNTER — Telehealth: Payer: Self-pay | Admitting: Internal Medicine

## 2015-02-07 NOTE — Telephone Encounter (Signed)
Yes, she needs a follow up appt

## 2015-02-07 NOTE — Telephone Encounter (Signed)
Pt did not come in for their appt on 02/06/15 for a follow up. Please let me know if pt needs to be contacted immediately for follow up or no follow up needed. Best phone number to contact pt is [phone].

## 2015-02-22 NOTE — Telephone Encounter (Signed)
Pt aware, appt scheduled for 03/06/15

## 2015-02-28 DIAGNOSIS — M4316 Spondylolisthesis, lumbar region: Secondary | ICD-10-CM | POA: Diagnosis not present

## 2015-03-06 ENCOUNTER — Telehealth: Payer: Self-pay

## 2015-03-06 ENCOUNTER — Encounter: Payer: Self-pay | Admitting: Internal Medicine

## 2015-03-06 ENCOUNTER — Ambulatory Visit (INDEPENDENT_AMBULATORY_CARE_PROVIDER_SITE_OTHER): Payer: Medicare Other | Admitting: Internal Medicine

## 2015-03-06 VITALS — BP 136/74 | HR 76 | Temp 98.1°F | Wt 279.0 lb

## 2015-03-06 DIAGNOSIS — E785 Hyperlipidemia, unspecified: Secondary | ICD-10-CM | POA: Diagnosis not present

## 2015-03-06 DIAGNOSIS — E1141 Type 2 diabetes mellitus with diabetic mononeuropathy: Secondary | ICD-10-CM | POA: Diagnosis not present

## 2015-03-06 DIAGNOSIS — Z794 Long term (current) use of insulin: Secondary | ICD-10-CM

## 2015-03-06 DIAGNOSIS — R609 Edema, unspecified: Secondary | ICD-10-CM

## 2015-03-06 DIAGNOSIS — I10 Essential (primary) hypertension: Secondary | ICD-10-CM

## 2015-03-06 LAB — HEMOGLOBIN A1C: Hgb A1c MFr Bld: 10.3 % — ABNORMAL HIGH (ref 4.6–6.5)

## 2015-03-06 NOTE — Assessment & Plan Note (Signed)
BP well controlled on Losartan and Norvasc ECG reviewed

## 2015-03-06 NOTE — Telephone Encounter (Signed)
Pt left v/m; pt can get diabetic med cheaper thru [facility] pharmacy contact # [phone]. Cost to pt is 3 months for $125.00. Pt is out of med. Pt request cb. Left v/m requesting pt to cb; pt did not leave in message if med was levemir or lantus.

## 2015-03-06 NOTE — Patient Instructions (Signed)

## 2015-03-06 NOTE — Progress Notes (Signed)
Pre visit review using our clinic review tool, if applicable. No additional management support is needed unless otherwise documented below in the visit note. 

## 2015-03-06 NOTE — Progress Notes (Signed)
Subjective:    Patient ID: Bethany [name], female    DOB: [birth year], 70 y.o.   MRN: [id number]  HPI  Pt presents to the clinic today for 3 month follow up of chronic conditions.  DM 2: [name] is 8.3% (10/2014). She does not test her sugars. She takes Lantus and Amaryl, but she has been out of her Lantus for the last week. She is also concerned about the cost of the Lantus. She has not noticed any hypoglycemia. She does not adhere to a diabetic diet. She does have some tingling in her feet. Her last eye exam was 05/2014 (no retinopathy). Flu 01/2014. Pneumovax never. Prevnar never.  HTN: BP well controlled on Norvasc and Losartan. Her BP today is 136/74. She denies chest pain, chest tightness or shortness of breath. ECG from 09/2014 reviewed.  HLD: She denies myalgias on Lipitor. [name] was 55, Triglycerides 93 (10/2014). She does not consume a low fat diet.  Peripheral edema: The swelling is worse throughout the day. She takes Lasix once a day as needed. She does keep her feet elevated.  Back pain: She had a low back surgery 10/2014 by Dr. Yetta Barre. She reports her pain is much better. She only takes Aleve or Tylenol as needed.  Review of Systems      Past Medical History  Diagnosis Date  . Arthritis   . Hyperlipidemia   . Hypertension   . Fibromyalgia   . Diabetes mellitus without complication (HCC)     Type II  . Shortness of breath dyspnea     with exertion  . GERD (gastroesophageal reflux disease)     better after gall bladder surgery-     Current Outpatient Prescriptions  Medication Sig Dispense Refill  . amLODipine (NORVASC) 10 MG tablet TAKE 1 TABLET BY MOUTH DAILY 30 tablet 2  . aspirin 81 MG tablet Take 81 mg by mouth daily.    Marland Kitchen atorvastatin (LIPITOR) 20 MG tablet TAKE 1 TABLET BY MOUTH DAILY (Patient taking differently: TAKE 20 MG BY MOUTH DAILY) 90 tablet 0  . docusate sodium (COLACE) 100 MG capsule Take 100 mg by mouth daily as needed for mild  constipation.    . furosemide (LASIX) 20 MG tablet Take 1 tablet (20 mg total) by mouth daily as needed. (Patient taking differently: Take 20 mg by mouth daily as needed for fluid. ) 30 tablet 2  . glimepiride (AMARYL) 4 MG tablet Take 1 tablet (4 mg total) by mouth daily with breakfast. 90 tablet 0  . glimepiride (AMARYL) 4 MG tablet TAKE ONE TABLET BY MOUTH EVERY MORNING WITH BREAKFAST 90 tablet 1  . insulin glargine (LANTUS) 100 UNIT/ML injection Inject 40 Units into the skin at bedtime.    Marland Kitchen losartan (COZAAR) 50 MG tablet Take 1 tablet (50 mg total) by mouth daily. 90 tablet 0  . methocarbamol (ROBAXIN) 500 MG tablet Take 1.5 tablets (750 mg total) by mouth every 6 (six) hours as needed for muscle spasms. (Patient taking differently: Take 500 mg by mouth every 6 (six) hours as needed for muscle spasms. ) 120 tablet 1  . naproxen sodium (ANAPROX) 220 MG tablet Take 220 mg by mouth as needed (for pain).     . Omega-3 Fatty Acids (FISH OIL PO) Take 1 capsule by mouth daily.    Marland Kitchen oxyCODONE-acetaminophen (PERCOCET/ROXICET) 5-325 MG tablet Take 1-2 tablets by mouth every 4 (four) hours as needed for moderate pain. 90 tablet 0  . sulfamethoxazole-trimethoprim (  BACTRIM DS,SEPTRA DS) 800-160 MG tablet Take 1 tablet by mouth 2 (two) times daily.    . vancomycin 500 mg in sodium chloride 0.9 % 100 mL Inject 500 mg into the vein every 12 (twelve) hours.     No current facility-administered medications for this visit.    No Known Allergies  Family History  Problem Relation Age of Onset  . Arthritis Mother   . Cancer Mother     Ovarian  . Diabetes Mother   . Heart disease Father   . Hypertension Father     Social History   Social History  . Marital Status: Married    Spouse Name: N/A  . Number of Children: N/A  . Years of Education: N/A   Occupational History  . Not on file.   Social History Main Topics  . Smoking status: Never Smoker   . Smokeless tobacco: Never Used  . Alcohol Use:  No  . Drug Use: No  . Sexual Activity: Not Currently   Other Topics Concern  . Not on file   Social History Narrative     Constitutional: Denies fatigue, weight gain, fever, malaise, headache.  HEENT: Denies eye pain, eye redness, ear pain, ringing in the ears, wax buildup, runny nose, nasal congestion, bloody nose, or sore throat. Respiratory: Denies difficulty breathing, shortness of breath, cough or sputum production.   Cardiovascular: Pt reports swelling in her feet. Denies chest pain, chest tightness, palpitations or swelling in the hands.  Gastrointestinal:  Denies abdominal pain, bloating, diarrhea, constipation, or blood in the stool.  Musculoskeletal: Pt reports back pain. Denies difficulty with gait, muscle pain or joint pain and swelling.  Skin: Denies redness, rashes, lesions or ulcercations.  Neurological: Denies dizziness, difficulty with memory, difficulty with speech or problems with balance and coordination.  Psych: Pt reports mild depression. Denies anxiety, SI/HI.  No other specific complaints in a complete review of systems (except as listed in HPI above).  Objective:   Physical Exam   BP 136/74 mmHg  Pulse 76  Temp(Src) 98.1 F (36.7 C) (Oral)  Wt 279 lb (126.554 kg)  SpO2 98%  Wt Readings from Last 3 Encounters:  11/03/14 280 lb (127.007 kg)  10/20/14 280 lb (127.007 kg)  09/06/14 290 lb (131.543 kg)    General: Appears her stated age, obese in NAD. Skin: Warm, dry and intact. No rashes, lesions or ulcerations noted. HEENT: Head: normal shape and size; Eyes: sclera white, no icterus, conjunctiva pink, PERRLA and EOMs intact;  Neck:  Neck supple, trachea midline. No masses, lumps or thyromegaly present.  Cardiovascular: Normal rate and rhythm. S1,S2 noted.  No murmur, rubs or gallops noted.No BLE edema. No carotid bruits noted. Pulmonary/Chest: Normal effort and positive vesicular breath sounds. No respiratory distress. No wheezes, rales or ronchi  noted.  Musculoskeletal: Normal flexion, extension and rotation of the spine. Mild bony tenderness noted over the lumbar spine. Strength 5/5 BLE. No difficulty with gait. Neurological: Alert and oriented.  Psychiatric: Mood slightly flat. She does engage but avoid eye contact.    BMET    Component Value Date/Time   NA 141 10/20/2014 1039   K 3.4* 10/20/2014 1039   CL 105 10/20/2014 1039   CO2 28 10/20/2014 1039   GLUCOSE 93 10/20/2014 1039   BUN 12 10/20/2014 1039   CREATININE 1.01* 10/20/2014 1039   CALCIUM 8.7* 10/20/2014 1039   GFRNONAA 55* 10/20/2014 1039   GFRAA >60 10/20/2014 1039    Lipid Panel  Component Value Date/Time   CHOL 120 05/06/2014 1448   TRIG 155.0* 05/06/2014 1448   HDL 38.10* 05/06/2014 1448   CHOLHDL 3 05/06/2014 1448   VLDL 31.0 05/06/2014 1448   LDLCALC 51 05/06/2014 1448    CBC    Component Value Date/Time   WBC 6.3 10/20/2014 1039   RBC 3.86* 10/20/2014 1039   HGB 9.9* 10/20/2014 1039   HCT 30.7* 10/20/2014 1039   PLT 318 10/20/2014 1039   MCV 79.5 10/20/2014 1039   MCH 25.6* 10/20/2014 1039   MCHC 32.2 10/20/2014 1039   RDW 15.5 10/20/2014 1039   LYMPHSABS 2.2 09/06/2014 1048   MONOABS 0.5 09/06/2014 1048   EOSABS 0.3 09/06/2014 1048   BASOSABS 0.0 09/06/2014 1048    Hgb A1C Lab Results  Component Value Date   HGBA1C 9.0* 08/01/2014        Assessment & Plan:

## 2015-03-06 NOTE — Assessment & Plan Note (Signed)
Will repeat A1C today Discussed compliance with diabetes diet, and medication therapy She will call her insurance company to see if Levemir is preferred over Lantus Continue Amaryl Continue yearly eye exams Flu shot UTD She declines pneumovax and prevnar today Foot exam today

## 2015-03-06 NOTE — Assessment & Plan Note (Signed)
Encouraged her to work on diet and exercise Start exercising now that back pain is better

## 2015-03-06 NOTE — Assessment & Plan Note (Signed)
LDL at goal on Lipitor No need to recheck Lipid Profile Encouraged her to consume a low fat diet

## 2015-03-06 NOTE — Assessment & Plan Note (Signed)
Continue Lasix prn

## 2015-03-08 ENCOUNTER — Other Ambulatory Visit: Payer: Self-pay | Admitting: Internal Medicine

## 2015-03-08 DIAGNOSIS — Z794 Long term (current) use of insulin: Principal | ICD-10-CM

## 2015-03-08 DIAGNOSIS — Z1211 Encounter for screening for malignant neoplasm of colon: Secondary | ICD-10-CM | POA: Diagnosis not present

## 2015-03-08 DIAGNOSIS — E1165 Type 2 diabetes mellitus with hyperglycemia: Secondary | ICD-10-CM

## 2015-03-08 DIAGNOSIS — K5730 Diverticulosis of large intestine without perforation or abscess without bleeding: Secondary | ICD-10-CM | POA: Diagnosis not present

## 2015-03-08 MED ORDER — INSULIN GLARGINE 100 UNITS/ML SOLOSTAR PEN: 46.0000 [IU] | PEN_INJECTOR | Freq: Every day | SUBCUTANEOUS | Status: DC

## 2015-03-08 MED ORDER — INSULIN PEN NEEDLE 31G X 5 MM MISC: Status: DC

## 2015-03-08 MED ORDER — INSULIN GLARGINE 100 UNIT/ML SOLOSTAR PEN: 46.0000 [IU] | PEN_INJECTOR | Freq: Every day | SUBCUTANEOUS | Status: DC

## 2015-03-08 NOTE — Telephone Encounter (Signed)
Rx sent through [facility] to mail order  

## 2015-03-08 NOTE — Addendum Note (Signed)
Addended by: Roena Malady on: 03/08/2015 11:18 AM   Modules accepted: Orders, Medications

## 2015-03-08 NOTE — Addendum Note (Signed)
Addended by: Roena Malady on: 03/08/2015 12:10 PM   Modules accepted: Orders

## 2015-03-29 ENCOUNTER — Ambulatory Visit (INDEPENDENT_AMBULATORY_CARE_PROVIDER_SITE_OTHER): Payer: Medicare Other | Admitting: Endocrinology

## 2015-03-29 ENCOUNTER — Encounter: Payer: Self-pay | Admitting: Endocrinology

## 2015-03-29 VITALS — BP 144/70 | HR 96 | Temp 98.3°F | Ht 67.0 in | Wt 282.2 lb

## 2015-03-29 DIAGNOSIS — E1165 Type 2 diabetes mellitus with hyperglycemia: Secondary | ICD-10-CM | POA: Diagnosis not present

## 2015-03-29 DIAGNOSIS — E118 Type 2 diabetes mellitus with unspecified complications: Secondary | ICD-10-CM | POA: Diagnosis not present

## 2015-03-29 DIAGNOSIS — IMO0002 Reserved for concepts with insufficient information to code with codable children: Secondary | ICD-10-CM

## 2015-03-29 DIAGNOSIS — Z794 Long term (current) use of insulin: Secondary | ICD-10-CM | POA: Diagnosis not present

## 2015-03-29 MED ORDER — INSULIN GLARGINE 100 UNIT/ML SOLOSTAR PEN: 46.0000 [IU] | PEN_INJECTOR | SUBCUTANEOUS | Status: DC

## 2015-03-29 NOTE — Progress Notes (Signed)
Pre visit review using our clinic review tool, if applicable. No additional management support is needed unless otherwise documented below in the visit note.  Subjective:    Patient ID: Bethany [name], female    DOB: [birth year], 70 y.o.   MRN: [id number]  HPI pt states DM was dx'ed in 1998; he has mild neuropathy of the lower extremities; she is unaware of any associated chronic complications; she has been on insulin since 2009; pt says her diet and exercise are not good; she has never had GDM, pancreatitis, severe hypoglycemia or DKA.  She frequently has mild hypoglycemia in the middle of the night.  She says cbg's vary from 66-177.  It is in general higher as the day goes on.  She will retire soon.   Past Medical History  Diagnosis Date  . Arthritis   . Hyperlipidemia   . Hypertension   . Fibromyalgia   . Diabetes mellitus without complication (HCC)     Type II  . Shortness of breath dyspnea     with exertion  . GERD (gastroesophageal reflux disease)     better after gall bladder surgery-     Past Surgical History  Procedure Laterality Date  . Abdominal hysterectomy  1997  . Cholecystectomy  2010  . Cataract extraction Bilateral 2014  . Colonoscopy    . Maximum access (mas)posterior lumbar interbody fusion (plif) 1 level N/A 09/16/2014    Procedure: L/4-5 FOR MAXIMUM ACCESS (MAS) POSTERIOR LUMBAR INTERBODY FUSION (PLIF) 1 LEVEL;  Surgeon: Tia Alert, MD;  Location: [facility];  Service: Neurosurgery;  Laterality: N/A;  L/4-5 FOR MAXIMUM ACCESS (MAS) POSTERIOR LUMBAR INTERBODY FUSION (PLIF) 1 LEVEL  . Eye surgery Bilateral     Cataract  . Lumbar wound debridement N/A 10/20/2014    Procedure: LUMBAR WOUND DEBRIDEMENT;  Surgeon: Tia Alert, MD;  Location: [facility];  Service: Neurosurgery;  Laterality: N/A;    Social History   Social History  . Marital Status: Married    Spouse Name: N/A  . Number of Children: N/A  . Years of Education: N/A   Occupational History    . Not on file.   Social History Main Topics  . Smoking status: Never Smoker   . Smokeless tobacco: Never Used  . Alcohol Use: No  . Drug Use: No  . Sexual Activity: Not Currently   Other Topics Concern  . Not on file   Social History Narrative    Current Outpatient Prescriptions on File Prior to Visit  Medication Sig Dispense Refill  . amLODipine (NORVASC) 10 MG tablet TAKE 1 TABLET BY MOUTH DAILY 30 tablet 2  . atorvastatin (LIPITOR) 20 MG tablet TAKE 1 TABLET BY MOUTH DAILY (Patient taking differently: TAKE 20 MG BY MOUTH DAILY) 90 tablet 0  . furosemide (LASIX) 20 MG tablet Take 1 tablet (20 mg total) by mouth daily as needed. (Patient taking differently: Take 20 mg by mouth daily as needed for fluid. ) 30 tablet 2  . Insulin Pen Needle 31G X 5 MM MISC Use 1 pen needle 2 times daily 200 each 3  . losartan (COZAAR) 50 MG tablet Take 1 tablet (50 mg total) by mouth daily. 90 tablet 0  . naproxen sodium (ANAPROX) 220 MG tablet Take 220 mg by mouth as needed (for pain).     . Omega-3 Fatty Acids (FISH OIL PO) Take 2,400 mg by mouth daily.     Marland Kitchen aspirin 81 MG tablet Take 81 mg by  mouth daily. Reported on 03/29/2015    . docusate sodium (COLACE) 100 MG capsule Take 100 mg by mouth daily as needed for mild constipation. Reported on 03/29/2015     No current facility-administered medications on file prior to visit.    No Known Allergies  Family History  Problem Relation Age of Onset  . Arthritis Mother   . Cancer Mother     Ovarian  . Diabetes Mother   . Heart disease Father   . Hypertension Father     BP 144/70 mmHg  Pulse 96  Temp(Src) 98.3 F (36.8 C) (Oral)  Ht 5\' 7"  (1.702 m)  Wt 282 lb 3.2 oz (128.005 kg)  BMI 44.19 kg/m2  SpO2 99%  Review of Systems denies weight loss, blurry vision, headache, chest pain, sob, n/v, urinary frequency, muscle cramps, excessive diaphoresis, depression, cold intolerance, rhinorrhea, and easy bruising.       Objective:   Physical  Exam VS: see vs page GEN: no distress.  Morbid obesity HEAD: head: no deformity eyes: no periorbital swelling, no proptosis external nose and ears are normal mouth: no lesion seen NECK: supple, thyroid is not enlarged CHEST WALL: no deformity LUNGS:  Clear to auscultation CV: reg rate and rhythm, no murmur ABD: abdomen is soft, nontender.  no hepatosplenomegaly.  not distended.  no hernia MUSCULOSKELETAL: muscle bulk and strength are grossly normal.  no obvious joint swelling.  gait is normal and steady EXTEMITIES: no deformity.  no ulcer on the feet.  feet are of normal color and temp.  1+ bilat leg edema.   PULSES: dorsalis pedis intact bilat.  no carotid bruit NEURO:  cn 2-12 grossly intact.   readily moves all 4's.  sensation is intact to touch on the feet, but decreased from normal.   SKIN:  Normal texture and temperature.  No rash or suspicious lesion is visible.   NODES:  None palpable at the neck PSYCH: alert, well-oriented.  Does not appear anxious nor depressed.  Lab Results  Component Value Date   HGBA1C 10.3* 03/06/2015   I have reviewed outside records, and summarized: Pt was noted to have severely elevated a1c, and referred here.  i personally reviewed electrocardiogram tracing (09/06/14): Indication: preop Impression: normal     Assessment & Plan:  DM: severe exacerbation.  Patient is advised the following: Patient Instructions  good diet and exercise significantly improve the control of your diabetes.  please let me know if you wish to be referred to a dietician.  high blood sugar is very risky to your health.  you should see an eye doctor and dentist every year.  It is very important to get all recommended vaccinations.  controlling your blood pressure and cholesterol drastically reduces the damage diabetes does to your body.  Those who smoke should quit.  please discuss these with your doctor.  Please consider having weight loss surgery.  It is good for your  health.  Here is some information about it.  If you decide to consider further, please call the phone number in the papers, and register for a free informational meeting check your blood sugar twice a day.  vary the time of day when you check, between before the 3 meals, and at bedtime.  also check if you have symptoms of your blood sugar being too high or too low.  please keep a record of the readings and bring it to your next appointment here (or you can bring the meter itself).  You can  write it on any piece of paper.  please call us sooner if your blood sugar goes below 70, or if you have a lot of readings over 200.  For now: please stop taking the glimepiride, and: Change the lantus to the morning. Please call us next week, to tell us how the blood sugar is doing.   Please come back for a follow-up appointment in 2 weeks.   At our office, we are fortunate to have two specialists who are happy to help you:   Cristy Folks, RN, CDE, is a diabetes educator and pump trainer.  She is here on Monday mornings, and all day Tuesday and Wednesday.  She is can help you with low blood sugar avoidance and treatment, injecting insulin, sick day management, and others.   Oran Rein, RD is our dietician.  She is here all day Thursday and Friday.  She can advise you about a healthy diet.  She can also help you about a variety of special diabetes situations, such as shift work, Animal nutritionist, gluten-free, diet for kidney patients, traveling with diabetes, and help for those who need to gain weight.

## 2015-03-29 NOTE — Patient Instructions (Addendum)
good diet and exercise significantly improve the control of your diabetes.  please let me know if you wish to be referred to a dietician.  high blood sugar is very risky to your health.  you should see an eye doctor and dentist every year.  It is very important to get all recommended vaccinations.  controlling your blood pressure and cholesterol drastically reduces the damage diabetes does to your body.  Those who smoke should quit.  please discuss these with your doctor.  Please consider having weight loss surgery.  It is good for your health.  Here is some information about it.  If you decide to consider further, please call the phone number in the papers, and register for a free informational meeting check your blood sugar twice a day.  vary the time of day when you check, between before the 3 meals, and at bedtime.  also check if you have symptoms of your blood sugar being too high or too low.  please keep a record of the readings and bring it to your next appointment here (or you can bring the meter itself).  You can write it on any piece of paper.  please call us sooner if your blood sugar goes below 70, or if you have a lot of readings over 200.  For now: please stop taking the glimepiride, and: Change the lantus to the morning. Please call us next week, to tell us how the blood sugar is doing.   Please come back for a follow-up appointment in 2 weeks.   At our office, we are fortunate to have two specialists who are happy to help you:   Cristy FolksLinda Spagnola, RN, CDE, is a diabetes educator and pump trainer.  She is here on Monday mornings, and all day Tuesday and Wednesday.  She is can help you with low blood sugar avoidance and treatment, injecting insulin, sick day management, and others.   Oran ReinLaura Jobe, RD is our dietician.  She is here all day Thursday and Friday.  She can advise you about a healthy diet.  She can also help you about a variety of special diabetes situations, such as shift work,  Animal nutritionistvegeterian diet, gluten-free, diet for kidney patients, traveling with diabetes, and help for those who need to gain weight.

## 2015-04-12 ENCOUNTER — Ambulatory Visit: Payer: Medicare Other | Admitting: Endocrinology

## 2015-04-18 ENCOUNTER — Other Ambulatory Visit: Payer: Self-pay | Admitting: Internal Medicine

## 2015-04-21 ENCOUNTER — Ambulatory Visit: Payer: Medicare Other | Admitting: Anesthesiology

## 2015-04-21 ENCOUNTER — Encounter
Admission: RE | Disposition: A | Discharge status: Home / Self Care | Payer: Self-pay | Source: Ambulatory Visit | Attending: Gastroenterology

## 2015-04-21 ENCOUNTER — Ambulatory Visit
Admission: RE | Admit: 2015-04-21 | Discharge: 2015-04-21 | Disposition: A | Discharge status: Home / Self Care | Payer: Medicare Other | Source: Ambulatory Visit | Attending: Gastroenterology | Admitting: Gastroenterology

## 2015-04-21 ENCOUNTER — Encounter: Payer: Self-pay | Admitting: Anesthesiology

## 2015-04-21 DIAGNOSIS — Z79899 Other long term (current) drug therapy: Secondary | ICD-10-CM | POA: Insufficient documentation

## 2015-04-21 DIAGNOSIS — K5730 Diverticulosis of large intestine without perforation or abscess without bleeding: Secondary | ICD-10-CM | POA: Diagnosis not present

## 2015-04-21 DIAGNOSIS — I10 Essential (primary) hypertension: Secondary | ICD-10-CM | POA: Diagnosis not present

## 2015-04-21 DIAGNOSIS — Z1211 Encounter for screening for malignant neoplasm of colon: Secondary | ICD-10-CM | POA: Insufficient documentation

## 2015-04-21 DIAGNOSIS — Z90710 Acquired absence of both cervix and uterus: Secondary | ICD-10-CM | POA: Insufficient documentation

## 2015-04-21 DIAGNOSIS — K5790 Diverticulosis of intestine, part unspecified, without perforation or abscess without bleeding: Secondary | ICD-10-CM | POA: Diagnosis not present

## 2015-04-21 DIAGNOSIS — M1990 Unspecified osteoarthritis, unspecified site: Secondary | ICD-10-CM | POA: Diagnosis not present

## 2015-04-21 DIAGNOSIS — E119 Type 2 diabetes mellitus without complications: Secondary | ICD-10-CM | POA: Insufficient documentation

## 2015-04-21 DIAGNOSIS — M797 Fibromyalgia: Secondary | ICD-10-CM | POA: Diagnosis not present

## 2015-04-21 DIAGNOSIS — R0602 Shortness of breath: Secondary | ICD-10-CM | POA: Diagnosis not present

## 2015-04-21 DIAGNOSIS — E785 Hyperlipidemia, unspecified: Secondary | ICD-10-CM | POA: Diagnosis not present

## 2015-04-21 DIAGNOSIS — Z7982 Long term (current) use of aspirin: Secondary | ICD-10-CM | POA: Insufficient documentation

## 2015-04-21 DIAGNOSIS — Z794 Long term (current) use of insulin: Secondary | ICD-10-CM | POA: Diagnosis not present

## 2015-04-21 DIAGNOSIS — K219 Gastro-esophageal reflux disease without esophagitis: Secondary | ICD-10-CM | POA: Diagnosis not present

## 2015-04-21 HISTORY — PX: COLONOSCOPY WITH PROPOFOL: SHX5780

## 2015-04-21 LAB — GLUCOSE, CAPILLARY: Glucose-Capillary: 160 mg/dL — ABNORMAL HIGH (ref 65–99)

## 2015-04-21 SURGERY — COLONOSCOPY WITH PROPOFOL
Anesthesia: General

## 2015-04-21 MED ORDER — FENTANYL CITRATE (PF) 100 MCG/2ML IJ SOLN
INTRAMUSCULAR | Status: DC | PRN
  Administered 2015-04-21: 50 ug via INTRAVENOUS

## 2015-04-21 MED ORDER — SODIUM CHLORIDE 0.9 % IV SOLN: INTRAVENOUS | Status: DC

## 2015-04-21 MED ORDER — PROPOFOL 500 MG/50ML IV EMUL
INTRAVENOUS | Status: DC | PRN
  Administered 2015-04-21: 120 ug/kg/min via INTRAVENOUS

## 2015-04-21 MED ORDER — SODIUM CHLORIDE 0.9 % IV SOLN
INTRAVENOUS | Status: DC
  Administered 2015-04-21: 09:00:00 via INTRAVENOUS

## 2015-04-21 MED ORDER — PROPOFOL 10 MG/ML IV BOLUS
INTRAVENOUS | Status: DC | PRN
  Administered 2015-04-21: 80 mg via INTRAVENOUS

## 2015-04-21 NOTE — Anesthesia Postprocedure Evaluation (Signed)
Anesthesia Post Note  Patient: Bethany [name]  Procedure(s) Performed: Procedure(s) (LRB): COLONOSCOPY WITH PROPOFOL (N/A)  Patient location during evaluation: Endoscopy Anesthesia Type: General Level of consciousness: awake and alert Pain management: pain level controlled Vital Signs Assessment: post-procedure vital signs reviewed and stable Respiratory status: spontaneous breathing, nonlabored ventilation, respiratory function stable and patient connected to nasal cannula oxygen Cardiovascular status: blood pressure returned to baseline and stable Postop Assessment: no signs of nausea or vomiting Anesthetic complications: no    Last Vitals:  Filed Vitals:   04/21/15 1027 04/21/15 1037  BP: 134/67 123/57  Pulse: 60 71  Temp:    Resp: 11 15    Last Pain: There were no vitals filed for this visit.               Jakobe Blau S

## 2015-04-21 NOTE — Op Note (Signed)
[facility] Gastroenterology Patient Name: Bethany [name] Procedure Date: 04/21/2015 9:42 AM MRN: [id number] Account #: [id number] Date of Birth: [birth year] Admit Type: Outpatient Age: 70 Room: [facility] ENDO ROOM 4 Gender: Female Note Status: Finalized Procedure:            Colonoscopy Indications:          Screening for colorectal malignant neoplasm Providers:            Ezzard Standing. Bluford Kaufmann, MD Referring MD:         Lorre Munroe (Referring MD) Medicines:            Monitored Anesthesia Care Complications:        No immediate complications. Procedure:            Pre-Anesthesia Assessment:                       - Prior to the procedure, a History and Physical was                        performed, and patient medications, allergies and                        sensitivities were reviewed. The patient's tolerance of                        previous anesthesia was reviewed.                       - The risks and benefits of the procedure and the                        sedation options and risks were discussed with the                        patient. All questions were answered and informed                        consent was obtained.                       - After reviewing the risks and benefits, the patient                        was deemed in satisfactory condition to undergo the                        procedure.                       After obtaining informed consent, the colonoscope was                        passed under direct vision. Throughout the procedure,                        the patient's blood pressure, pulse, and oxygen                        saturations were monitored continuously. The  Colonoscope was introduced through the anus and                        advanced to the the cecum, identified by appendiceal                        orifice and ileocecal valve. The colonoscopy was                        performed without difficulty. The patient  tolerated the                        procedure well. The quality of the bowel preparation                        was fair. Findings:      Many small and large-mouthed diverticula were found in the sigmoid colon.      The exam was otherwise without abnormality. Impression:           - Preparation of the colon was fair.                       - Diverticulosis in the sigmoid colon.                       - The examination was otherwise normal.                       - No specimens collected. Recommendation:       - Discharge patient to home.                       - The findings and recommendations were discussed with                        the patient.                       - High fiber diet. Procedure Code(s):    --- Professional ---                       [id number], Colonoscopy, flexible; diagnostic, including                        collection of specimen(s) by brushing or washing, when                        performed (separate procedure) Diagnosis Code(s):    --- Professional ---                       Z12.11, Encounter for screening for malignant neoplasm                        of colon                       K57.30, Diverticulosis of large intestine without                        perforation or abscess without bleeding CPT copyright 2016 American Medical Association. All rights reserved. The codes documented in this  report are preliminary and upon coder review may  be revised to meet current compliance requirements. Wallace Cullens, MD 04/21/2015 10:05:09 AM This report has been signed electronically. Number of Addenda: 0 Note Initiated On: 04/21/2015 9:42 AM Scope Withdrawal Time: 0 hours 5 minutes 35 seconds  Total Procedure Duration: 0 hours 14 minutes 1 second       [facility]

## 2015-04-21 NOTE — H&P (Signed)
Primary Care Physician:  Nicki ReaperBAITY, REGINA, NP Primary Gastroenterologist:  Dr. Bluford Kaufmannh  Pre-Procedure History & Physical: HPI:  Bethany [name] is a 70 y.o. female is here for an colonoscopy.   Past Medical History  Diagnosis Date  . Arthritis   . Hyperlipidemia   . Hypertension   . Fibromyalgia   . Diabetes mellitus without complication (HCC)     Type II  . Shortness of breath dyspnea     with exertion  . GERD (gastroesophageal reflux disease)     better after gall bladder surgery-     Past Surgical History  Procedure Laterality Date  . Abdominal hysterectomy  1997  . Cholecystectomy  2010  . Cataract extraction Bilateral 2014  . Colonoscopy    . Maximum access (mas)posterior lumbar interbody fusion (plif) 1 level N/A 09/16/2014    Procedure: L/4-5 FOR MAXIMUM ACCESS (MAS) POSTERIOR LUMBAR INTERBODY FUSION (PLIF) 1 LEVEL;  Surgeon: Tia Alertavid S Jones, MD;  Location: [facility];  Service: Neurosurgery;  Laterality: N/A;  L/4-5 FOR MAXIMUM ACCESS (MAS) POSTERIOR LUMBAR INTERBODY FUSION (PLIF) 1 LEVEL  . Eye surgery Bilateral     Cataract  . Lumbar wound debridement N/A 10/20/2014    Procedure: LUMBAR WOUND DEBRIDEMENT;  Surgeon: Tia Alertavid S Jones, MD;  Location: [facility];  Service: Neurosurgery;  Laterality: N/A;    Prior to Admission medications   Medication Sig Start Date End Date Taking? Authorizing Provider  aspirin 81 MG tablet Take 81 mg by mouth daily. Reported on 03/29/2015   Yes Historical Provider, MD  Omega-3 Fatty Acids (FISH OIL PO) Take 2,400 mg by mouth daily.    Yes Historical Provider, MD  amLODipine (NORVASC) 10 MG tablet TAKE 1 TABLET BY MOUTH DAILY 04/19/15   Lorre Munroeegina W Baity, NP  atorvastatin (LIPITOR) 20 MG tablet TAKE 1 TABLET BY MOUTH DAILY Patient taking differently: TAKE 20 MG BY MOUTH DAILY 06/14/14   Lorre Munroeegina W Baity, NP  docusate sodium (COLACE) 100 MG capsule Take 100 mg by mouth daily as needed for mild constipation. Reported on 03/29/2015    Historical Provider,  MD  furosemide (LASIX) 20 MG tablet Take 1 tablet (20 mg total) by mouth daily as needed. Patient taking differently: Take 20 mg by mouth daily as needed for fluid.  09/01/14   Lorre Munroeegina W Baity, NP  Insulin Glargine (LANTUS) 100 UNIT/ML Solostar Pen Inject 46 Units into the skin every morning. 03/29/15   Romero BellingSean Ellison, MD  Insulin Pen Needle 31G X 5 MM MISC Use 1 pen needle 2 times daily 03/08/15   Lorre Munroeegina W Baity, NP  losartan (COZAAR) 50 MG tablet Take 1 tablet (50 mg total) by mouth daily. 03/15/14   Lorre Munroeegina W Baity, NP  naproxen sodium (ANAPROX) 220 MG tablet Take 220 mg by mouth as needed (for pain).     Historical Provider, MD    Allergies as of 04/13/2015  . (No Known Allergies)    Family History  Problem Relation Age of Onset  . Arthritis Mother   . Cancer Mother     Ovarian  . Diabetes Mother   . Heart disease Father   . Hypertension Father     Social History   Social History  . Marital Status: Married    Spouse Name: N/A  . Number of Children: N/A  . Years of Education: N/A   Occupational History  . Not on file.   Social History Main Topics  . Smoking status: Never Smoker   .  Smokeless tobacco: Never Used  . Alcohol Use: No  . Drug Use: No  . Sexual Activity: Not Currently   Other Topics Concern  . Not on file   Social History Narrative    Review of Systems: See HPI, otherwise negative ROS  Physical Exam: BP 165/76 mmHg  Pulse 84  Temp(Src) 96.1 F (35.6 C) (Tympanic)  Resp 18  Ht  (1.702 m)  Wt 127.914 kg (282 lb)  BMI 44.16 kg/m2  SpO2 100% General:   Alert,  pleasant and cooperative in NAD Head:  Normocephalic and atraumatic. Neck:  Supple; no masses or thyromegaly. Lungs:  Clear throughout to auscultation.    Heart:  Regular rate and rhythm. Abdomen:  Soft, nontender and nondistended. Normal bowel sounds, without guarding, and without rebound.   Neurologic:  Alert and  oriented x4;  grossly normal neurologically.  Impression/Plan: Bethany [name] is here for an colonoscopy to be performed for screening Risks, benefits, limitations, and alternatives regarding  colonoscopy have been reviewed with the patient.  Questions have been answered.  All parties agreeable.   [name], Ezzard Standing, MD  04/21/2015, 9:37 AM

## 2015-04-21 NOTE — Anesthesia Preprocedure Evaluation (Addendum)
Anesthesia Evaluation  Patient identified by MRN, date of birth, ID band Patient awake    Reviewed: Allergy & Precautions, NPO status , Patient's Chart, lab work & pertinent test results, reviewed documented beta blocker date and time   Airway Mallampati: III  TM Distance: >3 FB     Dental  (+) Chipped, Missing   Pulmonary shortness of breath,           Cardiovascular hypertension, Pt. on medications      Neuro/Psych  Neuromuscular disease    GI/Hepatic   Endo/Other  diabetes, Type obesity  Renal/GU      Musculoskeletal  (+) Arthritis , Fibromyalgia -  Abdominal   Peds  Hematology   Anesthesia Other Findings   Reproductive/Obstetrics                            Anesthesia Physical Anesthesia Plan  ASA: III  Anesthesia Plan: General   Post-op Pain Management:    Induction: Intravenous  Airway Management Planned: Nasal Cannula  Additional Equipment:   Intra-op Plan:   Post-operative Plan:   Informed Consent: I have reviewed the patients History and Physical, chart, labs and discussed the procedure including the risks, benefits and alternatives for the proposed anesthesia with the patient or authorized representative who has indicated his/her understanding and acceptance.     Plan Discussed with: CRNA  Anesthesia Plan Comments:         Anesthesia Quick Evaluation

## 2015-04-21 NOTE — Transfer of Care (Signed)
Immediate Anesthesia Transfer of Care Note  Patient: Bethany CairoMary R Mccalip  Procedure(s) Performed: Procedure(s): COLONOSCOPY WITH PROPOFOL (N/A)  Patient Location: PACU and Endoscopy Unit  Anesthesia Type:General  Level of Consciousness: patient cooperative and lethargic  Airway & Oxygen Therapy: Patient Spontanous Breathing and Patient connected to nasal cannula oxygen  Post-op Assessment: Report given to RN and Post -op Vital signs reviewed and stable  Post vital signs: Reviewed and stable  Last Vitals:  Filed Vitals:   04/21/15 0902 04/21/15 1007  BP: 165/76 112/65  Pulse: 84 72  Temp: 35.6 C 36 C  Resp: 18 16    Complications: No apparent anesthesia complications

## 2015-04-25 ENCOUNTER — Encounter: Payer: Self-pay | Admitting: Gastroenterology

## 2015-05-01 ENCOUNTER — Encounter: Payer: Self-pay | Admitting: Endocrinology

## 2015-05-01 ENCOUNTER — Ambulatory Visit (INDEPENDENT_AMBULATORY_CARE_PROVIDER_SITE_OTHER): Payer: Medicare Other | Admitting: Endocrinology

## 2015-05-01 VITALS — BP 146/80 | HR 84 | Temp 98.6°F | Ht 67.0 in | Wt 281.0 lb

## 2015-05-01 DIAGNOSIS — Z794 Long term (current) use of insulin: Secondary | ICD-10-CM | POA: Diagnosis not present

## 2015-05-01 DIAGNOSIS — E1165 Type 2 diabetes mellitus with hyperglycemia: Secondary | ICD-10-CM

## 2015-05-01 DIAGNOSIS — IMO0002 Reserved for concepts with insufficient information to code with codable children: Secondary | ICD-10-CM

## 2015-05-01 DIAGNOSIS — E118 Type 2 diabetes mellitus with unspecified complications: Secondary | ICD-10-CM

## 2015-05-01 MED ORDER — INSULIN ASPART 100 UNIT/ML FLEXPEN: 5.0000 [IU] | PEN_INJECTOR | Freq: Three times a day (TID) | SUBCUTANEOUS | Status: DC

## 2015-05-01 MED ORDER — INSULIN GLARGINE 100 UNIT/ML SOLOSTAR PEN
30.0000 [IU] | PEN_INJECTOR | SUBCUTANEOUS | Status: DC
Start: 2015-05-01 — End: 2015-05-31

## 2015-05-01 NOTE — Patient Instructions (Addendum)
check your blood sugar twice a day.  vary the time of day when you check, between before the 3 meals, and at bedtime.  also check if you have symptoms of your blood sugar being too high or too low.  please keep a record of the readings and bring it to your next appointment here (or you can bring the meter itself).  You can write it on any piece of paper.  please call us sooner if your blood sugar goes below 70, or if you have a lot of readings over 200.  For now:  please reduce the lantus to 30 units each morning, and: Add novolog 5 units 3 times a day (just before each meal).  You would take this just before you eat, and only when you eat.  If you miss a meal, you should skip this shot also.   Please come back for a follow-up appointment in 1 month.

## 2015-05-01 NOTE — Progress Notes (Signed)
Subjective:    Patient ID: Bethany [name], female    DOB: [birth year], 70 y.o.   MRN: [id number]  HPI  Pt returns for f/u of diabetes mellitus: DM type: Insulin-requiring type 2 Dx'ed: 1998 Complications: polyneuropathy Therapy: insulin since 2009 GDM: never DKA: never Severe hypoglycemia: never Pancreatitis: never Other: she takes multiple daily injections Interval history: she brings a record of her cbg's which i have reviewed today.  It varies from 65-200.  It is in general higher as the day goes on.  She often eats just 2 meals per day.   Past Medical History  Diagnosis Date  . Arthritis   . Hyperlipidemia   . Hypertension   . Fibromyalgia   . Diabetes mellitus without complication (HCC)     Type II  . Shortness of breath dyspnea     with exertion  . GERD (gastroesophageal reflux disease)     better after gall bladder surgery-     Past Surgical History  Procedure Laterality Date  . Abdominal hysterectomy  1997  . Cholecystectomy  2010  . Cataract extraction Bilateral 2014  . Colonoscopy    . Maximum access (mas)posterior lumbar interbody fusion (plif) 1 level N/A 09/16/2014    Procedure: L/4-5 FOR MAXIMUM ACCESS (MAS) POSTERIOR LUMBAR INTERBODY FUSION (PLIF) 1 LEVEL;  Surgeon: Tia Alertavid S Jones, MD;  Location: [facility];  Service: Neurosurgery;  Laterality: N/A;  L/4-5 FOR MAXIMUM ACCESS (MAS) POSTERIOR LUMBAR INTERBODY FUSION (PLIF) 1 LEVEL  . Eye surgery Bilateral     Cataract  . Lumbar wound debridement N/A 10/20/2014    Procedure: LUMBAR WOUND DEBRIDEMENT;  Surgeon: Tia Alertavid S Jones, MD;  Location: [facility];  Service: Neurosurgery;  Laterality: N/A;  . Colonoscopy with propofol N/A 04/21/2015    Procedure: COLONOSCOPY WITH PROPOFOL;  Surgeon: Wallace CullensPaul Y Oh, MD;  [facility];  Service: Gastroenterology;  Laterality: N/A;    Social History   Social History  . Marital Status: Married    Spouse Name: N/A  . Number of Children: N/A  . Years of Education:  N/A   Occupational History  . Not on file.   Social History Main Topics  . Smoking status: Never Smoker   . Smokeless tobacco: Never Used  . Alcohol Use: No  . Drug Use: No  . Sexual Activity: Not Currently   Other Topics Concern  . Not on file   Social History Narrative    Current Outpatient Prescriptions on File Prior to Visit  Medication Sig Dispense Refill  . amLODipine (NORVASC) 10 MG tablet TAKE 1 TABLET BY MOUTH DAILY 30 tablet 4  . aspirin 81 MG tablet Take 81 mg by mouth daily. Reported on 03/29/2015    . atorvastatin (LIPITOR) 20 MG tablet TAKE 1 TABLET BY MOUTH DAILY (Patient taking differently: TAKE 20 MG BY MOUTH DAILY) 90 tablet 0  . docusate sodium (COLACE) 100 MG capsule Take 100 mg by mouth daily as needed for mild constipation. Reported on 03/29/2015    . furosemide (LASIX) 20 MG tablet Take 1 tablet (20 mg total) by mouth daily as needed. (Patient taking differently: Take 20 mg by mouth daily as needed for fluid. ) 30 tablet 2  . Insulin Pen Needle 31G X 5 MM MISC Use 1 pen needle 2 times daily (Patient taking differently: Use 1 pen needle 1 time daily) 200 each 3  . losartan (COZAAR) 50 MG tablet Take 1 tablet (50 mg total) by mouth daily. 90 tablet 0  .  naproxen sodium (ANAPROX) 220 MG tablet Take 220 mg by mouth as needed (for pain).     . Omega-3 Fatty Acids (FISH OIL PO) Take 2,400 mg by mouth daily.      No current facility-administered medications on file prior to visit.    No Known Allergies  Family History  Problem Relation Age of Onset  . Arthritis Mother   . Cancer Mother     Ovarian  . Diabetes Mother   . Heart disease Father   . Hypertension Father     BP 146/80 mmHg  Pulse 84  Temp(Src) 98.6 F (37 C) (Oral)  Ht  (1.702 m)  Wt 281 lb (127.461 kg)  BMI 44.00 kg/m2  SpO2 97%  Review of Systems Denies LOC.      Objective:   Physical Exam VITAL SIGNS:  See vs page GENERAL: no distress SKIN:  Insulin injection sites at the  anterior abdomen are normal.       Assessment & Plan:  DM: Based on the pattern of her cbg's, she needs some adjustment in her therapy  Patient is advised the following: Patient Instructions  check your blood sugar twice a day.  vary the time of day when you check, between before the 3 meals, and at bedtime.  also check if you have symptoms of your blood sugar being too high or too low.  please keep a record of the readings and bring it to your next appointment here (or you can bring the meter itself).  You can write it on any piece of paper.  please call us sooner if your blood sugar goes below 70, or if you have a lot of readings over 200.  For now:  please reduce the lantus to 30 units each morning, and: Add novolog 5 units 3 times a day (just before each meal).  You would take this just before you eat, and only when you eat.  If you miss a meal, you should skip this shot also.   Please come back for a follow-up appointment in 1 month.

## 2015-05-03 ENCOUNTER — Other Ambulatory Visit: Payer: Self-pay | Admitting: Internal Medicine

## 2015-05-19 ENCOUNTER — Telehealth: Payer: Self-pay | Admitting: Endocrinology

## 2015-05-19 MED ORDER — INSULIN LISPRO 100 UNIT/ML (KWIKPEN): 5.0000 [IU] | PEN_INJECTOR | Freq: Three times a day (TID) | SUBCUTANEOUS | Status: DC

## 2015-05-19 NOTE — Telephone Encounter (Signed)
Rx changed and pt notified.

## 2015-05-19 NOTE — Telephone Encounter (Signed)
Patient need a PA for medication Novalog, [facility] [phone]

## 2015-05-19 NOTE — Telephone Encounter (Signed)
Please change to humalog, at same dosage.

## 2015-05-19 NOTE — Telephone Encounter (Signed)
See note below and please advise if we can change to the humalog.

## 2015-05-31 ENCOUNTER — Encounter: Payer: Self-pay | Admitting: Endocrinology

## 2015-05-31 ENCOUNTER — Ambulatory Visit (INDEPENDENT_AMBULATORY_CARE_PROVIDER_SITE_OTHER): Payer: Medicare Other | Admitting: Endocrinology

## 2015-05-31 VITALS — BP 156/80 | HR 87 | Ht 67.0 in | Wt 277.0 lb

## 2015-05-31 DIAGNOSIS — E118 Type 2 diabetes mellitus with unspecified complications: Secondary | ICD-10-CM | POA: Diagnosis not present

## 2015-05-31 DIAGNOSIS — E1165 Type 2 diabetes mellitus with hyperglycemia: Secondary | ICD-10-CM

## 2015-05-31 DIAGNOSIS — IMO0002 Reserved for concepts with insufficient information to code with codable children: Secondary | ICD-10-CM

## 2015-05-31 DIAGNOSIS — Z794 Long term (current) use of insulin: Secondary | ICD-10-CM | POA: Diagnosis not present

## 2015-05-31 LAB — POCT GLYCOSYLATED HEMOGLOBIN (HGB A1C): Hemoglobin A1C: 11.7

## 2015-05-31 MED ORDER — INSULIN GLARGINE 100 UNIT/ML SOLOSTAR PEN: 20.0000 [IU] | PEN_INJECTOR | SUBCUTANEOUS | Status: DC

## 2015-05-31 MED ORDER — INSULIN LISPRO 100 UNIT/ML (KWIKPEN): 15.0000 [IU] | PEN_INJECTOR | Freq: Three times a day (TID) | SUBCUTANEOUS | Status: DC

## 2015-05-31 NOTE — Progress Notes (Signed)
Subjective:    Patient ID: Bethany [name], female    DOB: [birth year], 70 y.o.   MRN: [id number]  HPI Pt returns for f/u of diabetes mellitus: DM type: Insulin-requiring type 2 Dx'ed: 1998 Complications: polyneuropathy Therapy: insulin since 2009 GDM: never DKA: never Severe hypoglycemia: never Pancreatitis: never Other: she takes multiple daily injections, but she often eats just 2 meals per day.  Interval history: no cbg record, but states cbg's vary from 65-320.  It is in general higher as the day goes on.  pt states she feels well in general. Past Medical History  Diagnosis Date  . Arthritis   . Hyperlipidemia   . Hypertension   . Fibromyalgia   . Diabetes mellitus without complication (HCC)     Type II  . Shortness of breath dyspnea     with exertion  . GERD (gastroesophageal reflux disease)     better after gall bladder surgery-     Past Surgical History  Procedure Laterality Date  . Abdominal hysterectomy  1997  . Cholecystectomy  2010  . Cataract extraction Bilateral 2014  . Colonoscopy    . Maximum access (mas)posterior lumbar interbody fusion (plif) 1 level N/A 09/16/2014    Procedure: L/4-5 FOR MAXIMUM ACCESS (MAS) POSTERIOR LUMBAR INTERBODY FUSION (PLIF) 1 LEVEL;  Surgeon: Tia Alertavid S Jones, MD;  Location: [facility];  Service: Neurosurgery;  Laterality: N/A;  L/4-5 FOR MAXIMUM ACCESS (MAS) POSTERIOR LUMBAR INTERBODY FUSION (PLIF) 1 LEVEL  . Eye surgery Bilateral     Cataract  . Lumbar wound debridement N/A 10/20/2014    Procedure: LUMBAR WOUND DEBRIDEMENT;  Surgeon: Tia Alertavid S Jones, MD;  Location: [facility];  Service: Neurosurgery;  Laterality: N/A;  . Colonoscopy with propofol N/A 04/21/2015    Procedure: COLONOSCOPY WITH PROPOFOL;  Surgeon: Wallace CullensPaul Y Oh, MD;  [facility];  Service: Gastroenterology;  Laterality: N/A;    Social History   Social History  . Marital Status: Married    Spouse Name: N/A  . Number of Children: N/A  . Years of  Education: N/A   Occupational History  . Not on file.   Social History Main Topics  . Smoking status: Never Smoker   . Smokeless tobacco: Never Used  . Alcohol Use: No  . Drug Use: No  . Sexual Activity: Not Currently   Other Topics Concern  . Not on file   Social History Narrative    Current Outpatient Prescriptions on File Prior to Visit  Medication Sig Dispense Refill  . amLODipine (NORVASC) 10 MG tablet TAKE 1 TABLET BY MOUTH DAILY 30 tablet 4  . aspirin 81 MG tablet Take 81 mg by mouth daily. Reported on 03/29/2015    . atorvastatin (LIPITOR) 20 MG tablet TAKE 1 TABLET BY MOUTH DAILY (Patient taking differently: TAKE 20 MG BY MOUTH DAILY) 90 tablet 0  . docusate sodium (COLACE) 100 MG capsule Take 100 mg by mouth daily as needed for mild constipation. Reported on 03/29/2015    . furosemide (LASIX) 20 MG tablet Take 1 tablet (20 mg total) by mouth daily as needed. (Patient taking differently: Take 20 mg by mouth daily as needed for fluid. ) 30 tablet 2  . Insulin Pen Needle 31G X 5 MM MISC Use 1 pen needle 2 times daily (Patient taking differently: Use 1 pen needle 1 time daily) 200 each 3  . losartan (COZAAR) 50 MG tablet TAKE 1 TABLET BY MOUTH DAILY 90 tablet 0  . naproxen sodium (ANAPROX)  220 MG tablet Take 220 mg by mouth as needed (for pain).     . Omega-3 Fatty Acids (FISH OIL PO) Take 2,400 mg by mouth daily.      No current facility-administered medications on file prior to visit.    No Known Allergies  Family History  Problem Relation Age of Onset  . Arthritis Mother   . Cancer Mother     Ovarian  . Diabetes Mother   . Heart disease Father   . Hypertension Father     BP 156/80 mmHg  Pulse 87  Ht  (1.702 m)  Wt 277 lb (125.646 kg)  BMI 43.37 kg/m2  Review of Systems Denies LOC    Objective:   Physical Exam VITAL SIGNS:  See vs page GENERAL: no distress Pulses: dorsalis pedis intact bilat.   MSK: no deformity of the feet CV: no leg  edema Skin:  no ulcer on the feet.  normal color and temp on the feet. Neuro: sensation is intact to touch on the feet, but decreased from normal  A1c=11.7%     Assessment & Plan:  DM: Based on the pattern of her cbg's, she needs some adjustment in her therapy.   Patient is advised the following: Patient Instructions  check your blood sugar twice a day.  vary the time of day when you check, between before the 3 meals, and at bedtime.  also check if you have symptoms of your blood sugar being too high or too low.  please keep a record of the readings and bring it to your next appointment here (or you can bring the meter itself).  You can write it on any piece of paper.  please call us sooner if your blood sugar goes below 70, or if you have a lot of readings over 200.  For now:  please reduce the lantus to 20 units at bedtime, and:  Add novolog to 15 units 3 times a day (just before each meal).  You would take this just before you eat, and only when you eat.  If you miss a meal, you should skip this shot also.   Please come back for a follow-up appointment in 3 weeks.

## 2015-05-31 NOTE — Patient Instructions (Addendum)
check your blood sugar twice a day.  vary the time of day when you check, between before the 3 meals, and at bedtime.  also check if you have symptoms of your blood sugar being too high or too low.  please keep a record of the readings and bring it to your next appointment here (or you can bring the meter itself).  You can write it on any piece of paper.  please call us sooner if your blood sugar goes below 70, or if you have a lot of readings over 200.  For now:  please reduce the lantus to 20 units at bedtime, and:  Add novolog to 15 units 3 times a day (just before each meal).  You would take this just before you eat, and only when you eat.  If you miss a meal, you should skip this shot also.   Please come back for a follow-up appointment in 3 weeks.

## 2015-06-21 ENCOUNTER — Ambulatory Visit (INDEPENDENT_AMBULATORY_CARE_PROVIDER_SITE_OTHER): Payer: Medicare Other | Admitting: Endocrinology

## 2015-06-21 ENCOUNTER — Encounter: Payer: Self-pay | Admitting: Endocrinology

## 2015-06-21 VITALS — BP 148/68 | HR 85 | Temp 97.7°F | Ht 67.0 in | Wt 282.4 lb

## 2015-06-21 DIAGNOSIS — E118 Type 2 diabetes mellitus with unspecified complications: Secondary | ICD-10-CM | POA: Diagnosis not present

## 2015-06-21 DIAGNOSIS — E1165 Type 2 diabetes mellitus with hyperglycemia: Secondary | ICD-10-CM | POA: Diagnosis not present

## 2015-06-21 DIAGNOSIS — Z794 Long term (current) use of insulin: Secondary | ICD-10-CM

## 2015-06-21 DIAGNOSIS — IMO0002 Reserved for concepts with insufficient information to code with codable children: Secondary | ICD-10-CM

## 2015-06-21 MED ORDER — INSULIN LISPRO 100 UNIT/ML (KWIKPEN)
25.0000 [IU] | PEN_INJECTOR | Freq: Three times a day (TID) | SUBCUTANEOUS | Status: DC
Start: 2015-06-21 — End: 2015-08-02

## 2015-06-21 MED ORDER — INSULIN GLARGINE 100 UNIT/ML SOLOSTAR PEN: 10.0000 [IU] | PEN_INJECTOR | Freq: Every day | SUBCUTANEOUS | Status: DC

## 2015-06-21 NOTE — Progress Notes (Signed)
Pre visit review using our clinic review tool, if applicable. No additional management support is needed unless otherwise documented below in the visit note. 

## 2015-06-21 NOTE — Patient Instructions (Addendum)
check your blood sugar twice a day.  vary the time of day when you check, between before the 3 meals, and at bedtime.  also check if you have symptoms of your blood sugar being too high or too low.  please keep a record of the readings and bring it to your next appointment here (or you can bring the meter itself).  You can write it on any piece of paper.  please call us sooner if your blood sugar goes below 70, or if you have a lot of readings over 200.  For now:  please reduce the lantus to 10 units at bedtime, and:  increase novolog to 25 units 3 times a day (just before each meal).  You would take this just before you eat, and only when you eat.  If you miss a meal, you should skip this shot also.   Please come back for a follow-up appointment in 6 weeks.

## 2015-06-21 NOTE — Progress Notes (Signed)
Subjective:    Patient ID: Bethany [name], female    DOB: 12-01-1945, 70 y.o.   MRN: [id number]  HPI Pt returns for f/u of diabetes mellitus: DM type: Insulin-requiring type 2 Dx'ed: 1998 Complications: polyneuropathy Therapy: insulin since 2009 GDM: never DKA: never Severe hypoglycemia: never Pancreatitis: never Other: she takes multiple daily injections, but she often eats just 2 meals per day.  Interval history: she brings a record of her cbg's which i have reviewed today.  It varies from 164-340.  It is in general higher as the day goes on.  pt states she feels well in general.   Past Medical History  Diagnosis Date  . Arthritis   . Hyperlipidemia   . Hypertension   . Fibromyalgia   . Diabetes mellitus without complication (HCC)     Type II  . Shortness of breath dyspnea     with exertion  . GERD (gastroesophageal reflux disease)     better after gall bladder surgery-     Past Surgical History  Procedure Laterality Date  . Abdominal hysterectomy  1997  . Cholecystectomy  2010  . Cataract extraction Bilateral 2014  . Colonoscopy    . Maximum access (mas)posterior lumbar interbody fusion (plif) 1 level N/A 09/16/2014    Procedure: L/4-5 FOR MAXIMUM ACCESS (MAS) POSTERIOR LUMBAR INTERBODY FUSION (PLIF) 1 LEVEL;  Surgeon: Tia Alert, MD;  Location: [facility];  Service: Neurosurgery;  Laterality: N/A;  L/4-5 FOR MAXIMUM ACCESS (MAS) POSTERIOR LUMBAR INTERBODY FUSION (PLIF) 1 LEVEL  . Eye surgery Bilateral     Cataract  . Lumbar wound debridement N/A 10/20/2014    Procedure: LUMBAR WOUND DEBRIDEMENT;  Surgeon: Tia Alert, MD;  Location: [facility];  Service: Neurosurgery;  Laterality: N/A;  . Colonoscopy with propofol N/A 04/21/2015    Procedure: COLONOSCOPY WITH PROPOFOL;  Surgeon: Wallace Cullens, MD;  Location: [facility] ENDOSCOPY;  Service: Gastroenterology;  Laterality: N/A;    Social History   Social History  . Marital Status: Married    Spouse Name: N/A  . Number  of Children: N/A  . Years of Education: N/A   Occupational History  . Not on file.   Social History Main Topics  . Smoking status: Never Smoker   . Smokeless tobacco: Never Used  . Alcohol Use: No  . Drug Use: No  . Sexual Activity: Not Currently   Other Topics Concern  . Not on file   Social History Narrative    Current Outpatient Prescriptions on File Prior to Visit  Medication Sig Dispense Refill  . amLODipine (NORVASC) 10 MG tablet TAKE 1 TABLET BY MOUTH DAILY 30 tablet 4  . aspirin 81 MG tablet Take 81 mg by mouth daily. Reported on 03/29/2015    . atorvastatin (LIPITOR) 20 MG tablet TAKE 1 TABLET BY MOUTH DAILY (Patient taking differently: TAKE 20 MG BY MOUTH DAILY) 90 tablet 0  . docusate sodium (COLACE) 100 MG capsule Take 100 mg by mouth daily as needed for mild constipation. Reported on 03/29/2015    . furosemide (LASIX) 20 MG tablet Take 1 tablet (20 mg total) by mouth daily as needed. (Patient taking differently: Take 20 mg by mouth daily as needed for fluid. ) 30 tablet 2  . Insulin Pen Needle 31G X 5 MM MISC Use 1 pen needle 2 times daily (Patient taking differently: Use 1 pen needle 1 time daily) 200 each 3  . losartan (COZAAR) 50 MG tablet TAKE 1 TABLET BY  MOUTH DAILY 90 tablet 0  . naproxen sodium (ANAPROX) 220 MG tablet Take 220 mg by mouth as needed (for pain).     . Omega-3 Fatty Acids (FISH OIL PO) Take 2,400 mg by mouth daily.      No current facility-administered medications on file prior to visit.    No Known Allergies  Family History  Problem Relation Age of Onset  . Arthritis Mother   . Cancer Mother     Ovarian  . Diabetes Mother   . Heart disease Father   . Hypertension Father     BP 148/68 mmHg  Pulse 85  Temp(Src) 97.7 F (36.5 C) (Oral)  Ht 5\' 7"  (1.702 m)  Wt 282 lb 6 oz (128.084 kg)  BMI 44.22 kg/m2  SpO2 97%  Review of Systems She denies hypoglycemia    Objective:   Physical Exam VITAL SIGNS:  See vs page GENERAL: no  distress Pulses: dorsalis pedis intact bilat.   MSK: no deformity of the feet CV: 1+ bilat leg edema Skin:  no ulcer on the feet.  normal color and temp on the feet. Neuro: sensation is intact to touch on the feet, but decreased from normal Ext: There is bilateral onychomycosis of the toenails.       Assessment & Plan:  Insulin-requiring type 2 DM: The pattern of his cbg's indicates she needs some adjustment in his therapy.   Patient is advised the following: Patient Instructions  check your blood sugar twice a day.  vary the time of day when you check, between before the 3 meals, and at bedtime.  also check if you have symptoms of your blood sugar being too high or too low.  please keep a record of the readings and bring it to your next appointment here (or you can bring the meter itself).  You can write it on any piece of paper.  please call us sooner if your blood sugar goes below 70, or if you have a lot of readings over 200.  For now:  please reduce the lantus to 10 units at bedtime, and:  increase novolog to 25 units 3 times a day (just before each meal).  You would take this just before you eat, and only when you eat.  If you miss a meal, you should skip this shot also.   Please come back for a follow-up appointment in 6 weeks.    Romero BellingELLISON, Michaeal Davis, MD

## 2015-07-12 ENCOUNTER — Other Ambulatory Visit: Payer: Self-pay | Admitting: Internal Medicine

## 2015-08-02 ENCOUNTER — Encounter: Payer: Self-pay | Admitting: Endocrinology

## 2015-08-02 ENCOUNTER — Ambulatory Visit (INDEPENDENT_AMBULATORY_CARE_PROVIDER_SITE_OTHER): Payer: Medicare Other | Admitting: Endocrinology

## 2015-08-02 VITALS — BP 148/64 | HR 82 | Ht 67.0 in | Wt 285.4 lb

## 2015-08-02 DIAGNOSIS — E1165 Type 2 diabetes mellitus with hyperglycemia: Secondary | ICD-10-CM

## 2015-08-02 DIAGNOSIS — IMO0002 Reserved for concepts with insufficient information to code with codable children: Secondary | ICD-10-CM

## 2015-08-02 DIAGNOSIS — E1169 Type 2 diabetes mellitus with other specified complication: Secondary | ICD-10-CM

## 2015-08-02 LAB — POCT GLYCOSYLATED HEMOGLOBIN (HGB A1C): Hemoglobin A1C: 9.8

## 2015-08-02 MED ORDER — INSULIN LISPRO 100 UNIT/ML (KWIKPEN): 35.0000 [IU] | PEN_INJECTOR | Freq: Three times a day (TID) | SUBCUTANEOUS | Status: DC

## 2015-08-02 NOTE — Patient Instructions (Addendum)
check your blood sugar twice a day.  vary the time of day when you check, between before the 3 meals, and at bedtime.  also check if you have symptoms of your blood sugar being too high or too low.  please keep a record of the readings and bring it to your next appointment here (or you can bring the meter itself).  You can write it on any piece of paper.  please call us sooner if your blood sugar goes below 70, or if you have a lot of readings over 200.  For now:   Please continue the same lantus, 10 units at bedtime, and:   increase humalog to (20-35, depending on the size of the meal) units just before each meal.  You would take this just before you eat, and only when you eat.  If you miss a meal, you should skip this shot also.   Please come back for a follow-up appointment in 2 months.

## 2015-08-02 NOTE — Progress Notes (Signed)
Subjective:    Patient ID: Bethany [name], female    DOB: 01-May-1945, 70 y.o.   MRN: [id number]  HPI Pt returns for f/u of diabetes mellitus: DM type: Insulin-requiring type 2 Dx'ed: 1998 Complications: polyneuropathy Therapy: insulin since 2009 GDM: never DKA: never Severe hypoglycemia: never Pancreatitis: never Other: she takes multiple daily injections, but she often eats just 2 meals per day; she no loger receives steroid injections into her back, due to severe hyperglycemia.   Interval history: she brings a record of her cbg's which i have reviewed today.  It varies from 70-270.  It is in general higher as the day goes on.  However, it is sometimes low after a smaller-than-expected meal.  pt states she feels well in general.   Past Medical History  Diagnosis Date  . Arthritis   . Hyperlipidemia   . Hypertension   . Fibromyalgia   . Diabetes mellitus without complication (HCC)     Type II  . Shortness of breath dyspnea     with exertion  . GERD (gastroesophageal reflux disease)     better after gall bladder surgery-     Past Surgical History  Procedure Laterality Date  . Abdominal hysterectomy  1997  . Cholecystectomy  2010  . Cataract extraction Bilateral 2014  . Colonoscopy    . Maximum access (mas)posterior lumbar interbody fusion (plif) 1 level N/A 09/16/2014    Procedure: L/4-5 FOR MAXIMUM ACCESS (MAS) POSTERIOR LUMBAR INTERBODY FUSION (PLIF) 1 LEVEL;  Surgeon: Tia Alertavid S Jones, MD;  Location: [facility];  Service: Neurosurgery;  Laterality: N/A;  L/4-5 FOR MAXIMUM ACCESS (MAS) POSTERIOR LUMBAR INTERBODY FUSION (PLIF) 1 LEVEL  . Eye surgery Bilateral     Cataract  . Lumbar wound debridement N/A 10/20/2014    Procedure: LUMBAR WOUND DEBRIDEMENT;  Surgeon: Tia Alertavid S Jones, MD;  Location: [facility];  Service: Neurosurgery;  Laterality: N/A;  . Colonoscopy with propofol N/A 04/21/2015    Procedure: COLONOSCOPY WITH PROPOFOL;  Surgeon: Wallace CullensPaul Y Oh, MD;  [facility];  Service: Gastroenterology;  Laterality: N/A;    Social History   Social History  . Marital Status: Married    Spouse Name: N/A  . Number of Children: N/A  . Years of Education: N/A   Occupational History  . Not on file.   Social History Main Topics  . Smoking status: Never Smoker   . Smokeless tobacco: Never Used  . Alcohol Use: No  . Drug Use: No  . Sexual Activity: Not Currently   Other Topics Concern  . Not on file   Social History Narrative    Current Outpatient Prescriptions on File Prior to Visit  Medication Sig Dispense Refill  . amLODipine (NORVASC) 10 MG tablet TAKE 1 TABLET BY MOUTH DAILY 30 tablet 4  . aspirin 81 MG tablet Take 81 mg by mouth daily. Reported on 03/29/2015    . atorvastatin (LIPITOR) 20 MG tablet TAKE 1 TABLET BY MOUTH DAILY 90 tablet 0  . docusate sodium (COLACE) 100 MG capsule Take 100 mg by mouth daily as needed for mild constipation. Reported on 03/29/2015    . furosemide (LASIX) 20 MG tablet Take 1 tablet (20 mg total) by mouth daily as needed. (Patient taking differently: Take 20 mg by mouth daily as needed for fluid. ) 30 tablet 2  . Insulin Glargine (LANTUS) 100 UNIT/ML Solostar Pen Inject 10 Units into the skin daily. 45 mL 2  . losartan (COZAAR) 50 MG tablet  TAKE 1 TABLET BY MOUTH DAILY 90 tablet 0  . naproxen sodium (ANAPROX) 220 MG tablet Take 220 mg by mouth as needed (for pain).     . Omega-3 Fatty Acids (FISH OIL PO) Take 2,400 mg by mouth daily.      No current facility-administered medications on file prior to visit.    No Known Allergies  Family History  Problem Relation Age of Onset  . Arthritis Mother   . Cancer Mother     Ovarian  . Diabetes Mother   . Heart disease Father   . Hypertension Father     BP 148/64 mmHg  Pulse 82  Ht  (1.702 m)  Wt 285 lb 6.4 oz (129.457 kg)  BMI 44.69 kg/m2  SpO2 98%    Review of Systems Denies LOC    Objective:   Physical Exam VITAL SIGNS:  See vs  page GENERAL: no distress.  Morbid obesity.   Pulses: dorsalis pedis intact bilat.   MSK: no deformity of the feet CV: 1+ bilat leg edema Skin:  no ulcer on the feet.  normal color and temp on the feet. Neuro: sensation is intact to touch on the feet, but decreased from normal Ext: There is bilateral onychomycosis of the toenails.  A1c=9.8%    Assessment & Plan:  Insulin-requiring type 2 DM: she needs increased rx Hypoglycemia: because of this, she needs to take a variable mealtime dosage.   HTN: possible situational component.  We'll recheck next time.  Patient is advised the following: Patient Instructions  check your blood sugar twice a day.  vary the time of day when you check, between before the 3 meals, and at bedtime.  also check if you have symptoms of your blood sugar being too high or too low.  please keep a record of the readings and bring it to your next appointment here (or you can bring the meter itself).  You can write it on any piece of paper.  please call us sooner if your blood sugar goes below 70, or if you have a lot of readings over 200.  For now:   Please continue the same lantus, 10 units at bedtime, and:   increase humalog to (20-35, depending on the size of the meal) units just before each meal.  You would take this just before you eat, and only when you eat.  If you miss a meal, you should skip this shot also.   Please come back for a follow-up appointment in 2 months.     Romero Belling, MD

## 2015-08-28 DIAGNOSIS — Z961 Presence of intraocular lens: Secondary | ICD-10-CM | POA: Diagnosis not present

## 2015-08-28 DIAGNOSIS — Z794 Long term (current) use of insulin: Secondary | ICD-10-CM | POA: Diagnosis not present

## 2015-08-28 DIAGNOSIS — H52213 Irregular astigmatism, bilateral: Secondary | ICD-10-CM | POA: Diagnosis not present

## 2015-08-28 DIAGNOSIS — E119 Type 2 diabetes mellitus without complications: Secondary | ICD-10-CM | POA: Diagnosis not present

## 2015-08-28 LAB — HM DIABETES EYE EXAM

## 2015-09-04 ENCOUNTER — Encounter: Payer: Medicare Other | Admitting: Internal Medicine

## 2015-09-05 ENCOUNTER — Encounter: Payer: Self-pay | Admitting: Internal Medicine

## 2015-09-07 ENCOUNTER — Ambulatory Visit (INDEPENDENT_AMBULATORY_CARE_PROVIDER_SITE_OTHER)
Admission: RE | Admit: 2015-09-07 | Discharge: 2015-09-07 | Disposition: A | Discharge status: Home / Self Care | Payer: Medicare Other | Source: Ambulatory Visit | Attending: Internal Medicine | Admitting: Internal Medicine

## 2015-09-07 ENCOUNTER — Encounter: Payer: Self-pay | Admitting: Internal Medicine

## 2015-09-07 ENCOUNTER — Ambulatory Visit (INDEPENDENT_AMBULATORY_CARE_PROVIDER_SITE_OTHER): Payer: Medicare Other | Admitting: Internal Medicine

## 2015-09-07 VITALS — BP 140/80 | HR 84 | Temp 97.8°F | Ht 67.0 in | Wt 277.0 lb

## 2015-09-07 DIAGNOSIS — E785 Hyperlipidemia, unspecified: Secondary | ICD-10-CM

## 2015-09-07 DIAGNOSIS — M5441 Lumbago with sciatica, right side: Secondary | ICD-10-CM

## 2015-09-07 DIAGNOSIS — Z981 Arthrodesis status: Secondary | ICD-10-CM | POA: Diagnosis not present

## 2015-09-07 DIAGNOSIS — R609 Edema, unspecified: Secondary | ICD-10-CM

## 2015-09-07 DIAGNOSIS — E1149 Type 2 diabetes mellitus with other diabetic neurological complication: Secondary | ICD-10-CM | POA: Diagnosis not present

## 2015-09-07 DIAGNOSIS — I10 Essential (primary) hypertension: Secondary | ICD-10-CM | POA: Diagnosis not present

## 2015-09-07 DIAGNOSIS — M47816 Spondylosis without myelopathy or radiculopathy, lumbar region: Secondary | ICD-10-CM | POA: Diagnosis not present

## 2015-09-07 DIAGNOSIS — Z0000 Encounter for general adult medical examination without abnormal findings: Secondary | ICD-10-CM

## 2015-09-07 LAB — LIPID PANEL
Cholesterol: 132 mg/dL (ref 0–200)
HDL: 42.6 mg/dL (ref >39.00)
LDL Cholesterol: 59 mg/dL (ref 0–99)
NonHDL: 89.03
Total CHOL/HDL Ratio: 3
Triglycerides: 150 mg/dL — ABNORMAL HIGH (ref 0.0–149.0)
VLDL: 30 mg/dL (ref 0.0–40.0)

## 2015-09-07 LAB — CBC
HCT: 37.2 % (ref 36.0–46.0)
Hemoglobin: 12.3 g/dL (ref 12.0–15.0)
MCHC: 33 g/dL (ref 30.0–36.0)
MCV: 79.4 fl (ref 78.0–100.0)
Platelets: 294 10*3/uL (ref 150.0–400.0)
RBC: 4.69 Mil/uL (ref 3.87–5.11)
RDW: 16.5 % — ABNORMAL HIGH (ref 11.5–15.5)
WBC: 7.1 10*3/uL (ref 4.0–10.5)

## 2015-09-07 LAB — COMPREHENSIVE METABOLIC PANEL
ALT: 18 U/L (ref 0–35)
AST: 21 U/L (ref 0–37)
Albumin: 4 g/dL (ref 3.5–5.2)
Alkaline Phosphatase: 152 U/L — ABNORMAL HIGH (ref 39–117)
BUN: 13 mg/dL (ref 6–23)
CO2: 32 mEq/L (ref 19–32)
Calcium: 9.4 mg/dL (ref 8.4–10.5)
Chloride: 103 mEq/L (ref 96–112)
Creatinine, Ser: 1.01 mg/dL (ref 0.40–1.20)
GFR: 69.19 mL/min (ref >60.00)
Glucose, Bld: 108 mg/dL — ABNORMAL HIGH (ref 70–99)
Potassium: 3.6 mEq/L (ref 3.5–5.1)
Sodium: 142 mEq/L (ref 135–145)
Total Bilirubin: 0.5 mg/dL (ref 0.2–1.2)
Total Protein: 7.9 g/dL (ref 6.0–8.3)

## 2015-09-07 NOTE — Assessment & Plan Note (Signed)
Continue Lasix and elevation CMET today

## 2015-09-07 NOTE — Assessment & Plan Note (Signed)
A1C still not at goal Discussed the importance of a low fat, low carb diet and exercising to lose weight Encouraged her to get a flu shot in the fall She declines pneumonia vaccines Encouraged her to see an eye doctor annually Foot exam today

## 2015-09-07 NOTE — Patient Instructions (Signed)

## 2015-09-07 NOTE — Progress Notes (Signed)
HPI:  Pt presents to the clinic today for her Medicare Wellness Exam. She is also due to follow up chronic conditions:  DM 2: [name] is 9.8% (07/2015). She is following with Dr. Everardo AllEllison, noted from 07/2015 reviewed. Her sugars range 70-270.Marland Kitchen. She takes Lantus, Humalog as prescribed. She has not noticed any hypoglycemia. She does not adhere to a diabetic diet. She does have some tingling in her feet. Her last eye exam was 5/20167 (no retinopathy). Flu 01/2014. Pneumovax never. Prevnar never.   HTN: BP well controlled on Norvasc and Losartan. Her BP today is 140/80. She does feel like her blood pressure medication makes her lightheaded at times. It seems worse when she goes from a sitting to a standing position. She denies chest pain, chest tightness or shortness of breath. ECG from 09/2014 reviewed.   HLD: She denies myalgias on Lipitor. [name] was 55, Triglycerides 93 (10/2014). She does not consume a low fat diet.   Peripheral edema: The swelling is worse throughout the day. She takes Lasix once a day as needed. She does keep her feet elevated.   Back pain: She had a low back surgery 10/2014 by Dr. Yetta BarreJones. She reports her pain now seems worse. The pain radiates down her right leg. She only takes Aleve or Tylenol as needed for the pain.  Past Medical History:  Diagnosis Date  . Arthritis   . Diabetes mellitus without complication (HCC)    Type II  . Fibromyalgia   . GERD (gastroesophageal reflux disease)    better after gall bladder surgery-   . Hyperlipidemia   . Hypertension   . Shortness of breath dyspnea    with exertion    Current Outpatient Prescriptions  Medication Sig Dispense Refill  . amLODipine (NORVASC) 10 MG tablet TAKE 1 TABLET BY MOUTH DAILY 30 tablet 4  . aspirin 81 MG tablet Take 81 mg by mouth daily. Reported on 03/29/2015    . atorvastatin (LIPITOR) 20 MG tablet TAKE 1 TABLET BY MOUTH DAILY 90 tablet 0  . docusate sodium (COLACE) 100 MG capsule Take 100 mg by  mouth daily as needed for mild constipation. Reported on 03/29/2015    . furosemide (LASIX) 20 MG tablet Take 1 tablet (20 mg total) by mouth daily as needed. (Patient taking differently: Take 20 mg by mouth daily as needed for fluid. ) 30 tablet 2  . Insulin Glargine (LANTUS) 100 UNIT/ML Solostar Pen Inject 10 Units into the skin daily. 45 mL 2  . insulin lispro (HUMALOG KWIKPEN) 100 UNIT/ML KiwkPen Inject 0.35 mLs (35 Units total) into the skin 3 (three) times daily with meals. And pen needles 4/day 75 mL 11  . losartan (COZAAR) 50 MG tablet TAKE 1 TABLET BY MOUTH DAILY 90 tablet 0  . naproxen sodium (ANAPROX) 220 MG tablet Take 220 mg by mouth as needed (for pain).     . Omega-3 Fatty Acids (FISH OIL PO) Take 2,400 mg by mouth daily.      No current facility-administered medications for this visit.     No Known Allergies  Family History  Problem Relation Age of Onset  . Arthritis Mother   . Cancer Mother     Ovarian  . Diabetes Mother   . Heart disease Father   . Hypertension Father     Social History   Social History  . Marital status: Married    Spouse name: N/A  . Number of children: N/A  . Years of education:  N/A   Occupational History  . Not on file.   Social History Main Topics  . Smoking status: Never Smoker  . Smokeless tobacco: Never Used  . Alcohol use No  . Drug use: No  . Sexual activity: Not Currently   Other Topics Concern  . Not on file   Social History Narrative  . No narrative on file    Hospitiliaztions: None  Health Maintenance:    Flu: 11/2014  Tetanus: unsure  Pneumovax: declines  Prevnar: declines  Zostavax: declines  Mammogram: 06/2013  Pap Smear: no longer screening  Bone Density: never  Colon Screening: 04/2015  [facility]  Dental Exam: biannually   Providers:   PCP: Nicki Reaperegina Baity, NP-C  Endocrinologist: Dr. Everardo AllEllison  Neurosurgeon: Dr. Yetta BarreJones  Gastroenterologist: Dr. Bluford Kaufmannh     I have  personally reviewed and have noted:  1. The patient's medical and social history 2. Their use of alcohol, tobacco or illicit drugs 3. Their current medications and supplements 4. The patient's functional ability including ADL's, fall risks, home  safety risks and hearing or visual impairment. 5. Diet and physical activities 6. Evidence for depression or mood disorder  Subjective:   Review of Systems:   Constitutional: Denies fever, malaise, fatigue, headache or abrupt weight changes.  HEENT: Denies eye pain, eye redness, ear pain, ringing in the ears, wax buildup, runny nose, nasal congestion, bloody nose, or sore throat. Respiratory: Denies difficulty breathing, shortness of breath, cough or sputum production.   Cardiovascular: Pt reports swelling in the feet. Denies chest pain, chest tightness, palpitations or swelling in the hands.  Gastrointestinal: Pt reports occasional constipation. Denies abdominal pain, bloating, diarrhea or blood in the stool.  GU: Denies urgency, frequency, pain with urination, burning sensation, blood in urine, odor or discharge. Musculoskeletal: Pt reports low back pain and pain in heels. Denies difficulty with gait, muscle pain or joint pain and swelling.  Skin: Denies redness, rashes, lesions or ulcercations.  Neurological: Pt reports tingling in her feet. Denies dizziness, difficulty with memory, difficulty with speech or problems with balance and coordination.  Psych: Denies anxiety, depression, SI/HI.  No other specific complaints in a complete review of systems (except as listed in HPI above).  Objective:  PE:   BP 140/80   Pulse 84   Temp 97.8 F (36.6 C) (Oral)   Ht 5\' 7"  (1.702 m)   Wt 277 lb (125.6 kg)   SpO2 98%   BMI 43.38 kg/m   Wt Readings from Last 3 Encounters:  09/07/15 277 lb (125.6 kg)  08/02/15 285 lb 6.4 oz (129.5 kg)  06/21/15 282 lb 6 oz (128.1 kg)    General: Appears her stated age, oese in NAD. Skin: Warm, dry and  intact.  HEENT: Head: normal shape and size; Eyes: sclera white, no icterus, conjunctiva pink, PERRLA and EOMs intact;  Cardiovascular: Normal rate and rhythm. S1,S2 noted. Murmur noted. 1+ BLE edema. No carotid bruits noted. Pulmonary/Chest: Normal effort and positive vesicular breath sounds. No respiratory distress. No wheezes, rales or ronchi noted.  Abdomen: Soft and nontender. Musculoskeletal: Decreased flexion and rotation of the spine. Normal extension. Pain with palpation over the lumbar spine. Strength 5/5 BUE/BLE. Pain with palpation of bilateral heels. Her gait is slow but steady, she does lean to the left. Neurological: Alert and oriented. Sensation intact to BLE. Psychiatric: Mood and affect normal. Behavior is normal. Judgment and thought content normal.    BMET    Component Value  Date/Time   NA 141 10/20/2014 1039   K 3.4 (L) 10/20/2014 1039   CL 105 10/20/2014 1039   CO2 28 10/20/2014 1039   GLUCOSE 93 10/20/2014 1039   BUN 12 10/20/2014 1039   CREATININE 1.01 (H) 10/20/2014 1039   CALCIUM 8.7 (L) 10/20/2014 1039   GFRNONAA 55 (L) 10/20/2014 1039   GFRAA >60 10/20/2014 1039    Lipid Panel     Component Value Date/Time   CHOL 120 05/06/2014 1448   TRIG 155.0 (H) 05/06/2014 1448   HDL 38.10 (L) 05/06/2014 1448   CHOLHDL 3 05/06/2014 1448   VLDL 31.0 05/06/2014 1448   LDLCALC 51 05/06/2014 1448    CBC    Component Value Date/Time   WBC 6.3 10/20/2014 1039   RBC 3.86 (L) 10/20/2014 1039   HGB 9.9 (L) 10/20/2014 1039   HCT 30.7 (L) 10/20/2014 1039   PLT 318 10/20/2014 1039   MCV 79.5 10/20/2014 1039   MCH 25.6 (L) 10/20/2014 1039   MCHC 32.2 10/20/2014 1039   RDW 15.5 10/20/2014 1039   LYMPHSABS 2.2 09/06/2014 1048   MONOABS 0.5 09/06/2014 1048   EOSABS 0.3 09/06/2014 1048   BASOSABS 0.0 09/06/2014 1048    Hgb A1C Lab Results  Component Value Date   HGBA1C 9.8 08/02/2015      Assessment and Plan:   Medicare Annual Wellness Visit:  Diet: She  seldom eats meat. She eats fruits and veggies daily. She tries to avoid fried foods. She drinks mostly sweet tea (1 pitcher per day) and some water. Physical activity: Sedentary Depression/mood screen: Negative Hearing: Intact to whispered voice Visual acuity: Grossly normal, performs annual eye exam  ADLs: Capable Fall risk: None Home safety: Good Cognitive evaluation: Intact to orientation, naming, recall and repetition EOL planning: No adv directives, full code/ I agree  Preventative Medicine: Encouraged her to get a flu shot in the fall. She declines Tetanus, Prevnar, Pneumovax or Zostovax. She no longer wants to to do pap smear, mammogram or bone density exams. Colonoscopy UTD. Encouraged her to consume a balanced diet and start an exercise regimen. Encouraged her to continue to see an eye doctor and dentist annually. Will check CBC, CMET, Lipid, A1C today. She declines Hep C screening.   Next appointment: 1 year Medicare Wellness/Follow up  Low back pain with right side sciatica:  Will repeat xray lumbar spine today Stretching exercises given  Nicki Reaper, NP

## 2015-09-07 NOTE — Assessment & Plan Note (Signed)
Controlled with Losartan and Norvasc CMET today

## 2015-09-07 NOTE — Assessment & Plan Note (Signed)
Encouraged her to consume a low fat diet Continue Lipitor unless directed otherwise CMET and Lipid Profile today

## 2015-09-26 ENCOUNTER — Encounter: Payer: Self-pay | Admitting: Podiatry

## 2015-09-26 ENCOUNTER — Ambulatory Visit (INDEPENDENT_AMBULATORY_CARE_PROVIDER_SITE_OTHER): Payer: Medicare Other | Admitting: Podiatry

## 2015-09-26 ENCOUNTER — Ambulatory Visit (INDEPENDENT_AMBULATORY_CARE_PROVIDER_SITE_OTHER): Payer: Medicare Other

## 2015-09-26 VITALS — BP 148/74 | HR 73 | Resp 16 | Ht 67.5 in | Wt 280.0 lb

## 2015-09-26 DIAGNOSIS — M2580 Other specified joint disorders, unspecified joint: Secondary | ICD-10-CM

## 2015-09-26 DIAGNOSIS — M899 Disorder of bone, unspecified: Secondary | ICD-10-CM

## 2015-09-26 DIAGNOSIS — M79671 Pain in right foot: Secondary | ICD-10-CM

## 2015-09-26 DIAGNOSIS — M25571 Pain in right ankle and joints of right foot: Secondary | ICD-10-CM | POA: Diagnosis not present

## 2015-09-26 DIAGNOSIS — M25572 Pain in left ankle and joints of left foot: Secondary | ICD-10-CM

## 2015-09-26 DIAGNOSIS — M2141 Flat foot [pes planus] (acquired), right foot: Secondary | ICD-10-CM

## 2015-09-26 DIAGNOSIS — M2142 Flat foot [pes planus] (acquired), left foot: Secondary | ICD-10-CM | POA: Diagnosis not present

## 2015-09-26 MED ORDER — BETAMETHASONE SOD PHOS & ACET 6 (3-3) MG/ML IJ SUSP: 12.0000 mg | Freq: Once | INTRAMUSCULAR | Status: DC

## 2015-09-26 NOTE — Progress Notes (Signed)
Patient ID: Bethany [name], female   DOB: Jun 04, 1945, 70 y.o.   MRN: [id number] Subjective: Patient presents today with pain and tenderness about the bilateral ankles as well as the right sesamoidal area. Patient states that she's had pain and tenderness when she walks to her ankles and her right great toe. This pain and right great toes been going on for about 3-4 weeks now. Patient likes to travel and does lots of traveling and walking. Patient is also diabetic. She is seeing a diabetic specialist as well as her primary care doctor to manage her diabetes. Patient states that her hemoglobin A1c levels are about 10.3 and her blood sugars run between 200 and 300.   Objective: Physical Exam General: The patient is alert and oriented x3 in no acute distress.  Dermatology: Skin is warm, dry and supple bilateral lower extremities. Negative for open lesions or macerations.  Vascular: Palpable pedal pulses bilaterally. No edema or erythema noted. Capillary refill within normal limits.  Neurological: Epicritic and protective threshold grossly intact bilaterally.   Musculoskeletal Exam: Severe pain on palpation to the plantar aspect of the sesamoidal apparatus right great toe. Also pain on palpation overlying the ATF ligament bilateral ankles. Clinical pes planus noted bilaterally. Range of motion within normal limits to all pedal and ankle joints bilateral. Muscle strength 5/5 in all groups bilateral.   Radiographic Exam:  Pes planus deformity noted on bilateral x-rays. Evidence of a history of fracture to the right great toe noted extra-articular.  Normal osseous mineralization. Joint spaces preserved. No fracture/dislocation/boney destruction.     Assessment: #1 diabetes mellitus #2 ankle joint synovitis  #3 sesamoiditis right great toe #4 pain in bilateral feet and ankles  #5 pes planus bilateral Problem List Items Addressed This Visit    None    Visit Diagnoses    Foot pain, right    -   Primary   Relevant Orders   DG Foot Complete Right   Bilateral ankle pain       Relevant Orders   DG Ankle Complete Right   DG Ankle Complete Left        Plan of Care:  #1 Patient was evaluated. #2 Injection of 0.5 mL Celestone Soluspan injected into the right sesamoidal apparatus. #3 plantar fascial straps were given to the bilateral feet for midfoot support to alleviate symptoms of pes planus deformity as well as ankle joint synovitis and lateral ankle impingement. #4 discussed with patient the importance of controlling blood sugar. #5 patient will require medically necessary diabetic shoes with custom diabetic inserts. #6 patient is to return to clinic in 4 weeks     Dr. Felecia ShellingBrent M. Limuel Nieblas, DPM [facility]

## 2015-09-26 NOTE — Progress Notes (Signed)
   Subjective:    Patient ID: Bethany [name], female    DOB: 21-Apr-1945, 70 y.o.   MRN: [id number]  HPI    Review of Systems  Constitutional: Positive for appetite change.  Eyes: Positive for itching and visual disturbance.  Respiratory: Positive for shortness of breath.   Gastrointestinal: Positive for abdominal distention, abdominal pain and diarrhea.  Musculoskeletal: Positive for arthralgias, back pain and gait problem.  All other systems reviewed and are negative.      Objective:   Physical Exam        Assessment & Plan:

## 2015-09-26 NOTE — Patient Instructions (Signed)
Sesamoid Injury  Sesamoid bones are bones that are completely enclosed by a tendon. The most recognizable sesamoid bone is the kneecap (patella). Your body also has sesamoid bones in the hands and feet. Sesamoid bones of the feet are more commonly injured than those of the hand. Sesamoid bones in the feet may be injured because of the force placed on them while standing, walking, running, or jumping. Sesamoid injuries include:   Inflammation of the sesamoid (sesamoiditis).  Fracture.  Stress fracture. The sesamoid bone on the base of the big toe is especially susceptible. SYMPTOMS   Pain with weight bearing on the foot, such as with standing, walking, running, jumping, or dancing.  Pain with trying to lift the big toe.  Tenderness and swelling under the base of the big toe. CAUSES  A sesamoid injury is typically caused by acute trauma or overuse trauma to the foot. This may include jumping and landing on the ball of the foot or jumping or dancing on the balls of the feet. Other causes include:  Interrupted blood supply (avascular necrosis).  Infection. RISK INCREASES WITH:  Sports that require jumping from a great height or repeated jumping or standing on the balls of the feet. These include:  Basketball.  Ballet.  Jogging.  Long-distance running.  Shoes that are too small or have very high heels.  Large or poorly shaped sesamoid bone.  Bunions. PREVENTION  Warm up and stretch properly before activity.  Maintain appropriate conditioning:  Ankle and leg flexibility.  Muscle strength and endurance.  Learn and use proper technique and have a coach correct improper technique.  Wear taping, protective strapping, bracing, or padding.  Wear shoes that are the proper size and ensure correct fit. PROGNOSIS If detected early and treated properly, sesamoid injuries are usually curable within 4 to 6 months.  RELATED COMPLICATIONS   Prolonged healing time if not  appropriately treated or if not given enough time to heal.  Fracture does not heal (nonunion).  Prolonged disability.  Frequent recurrence of symptoms. Appropriately addressing the problem with rehabilitation decreases frequency of recurrence and optimizes healing time.  Arthritis of the joint between the sesamoid and the rest of the big toe.  Complications of surgery, including infection, bleeding, injury to nerves, continued pain, bunion or reverse bunion formation, toe weakness, and toe hyperextension. TREATMENT Treatment initially involves the use of ice and medication to reduce pain and inflammation. It may be recommended for you to modify your activities, so they do not cause an increase in the severity of symptoms. Depending on the severity of the injury, you may be required to use crutches in order to keep weight off of the injury. Padding, bracing, or taping the area may help reduce pain. Casting of the leg and foot, a walking boot, or a stiff-soled shoe (with or without an arch support) may also be helpful. For cases of chronic sesamoid symptoms, the use of physical therapy may be recommended. On occasion, corticosteroid injections are given to reduce inflammation. It is uncommon, but possible, for surgery to be necessary to remove the sesamoid bone. MEDICATION   If pain medication is necessary, nonsteroidal anti-inflammatory medications such as aspirin and ibuprofen or other minor pain relievers such as acetaminophen are often recommended.  Do not take pain medication for 7 days before surgery.  Prescription pain relievers are usually only prescribed after surgery. Use only as directed and only as much as you need.  Corticosteroid injections may be given to reduce inflammation. However, these  injections may only be given a certain number of times. HEAT AND COLD Cold treatment (icing) relieves pain and reduces inflammation. Cold treatment should be applied for 10 to 15 minutes every  2 to 3 hours for inflammation and pain and immediately after any activity that aggravates your symptoms. Use ice packs or massage the area with a piece of ice (ice massage). SEEK MEDICAL CARE IF:   Symptoms get worse or do not improve in 6 weeks despite treatment.  Any signs of infection develop, including fever, headaches, muscular aches and weakness, fatigue, redness, warmth, or increased swelling or pain.  Any of the following occur after surgery:  You experience pain, numbness, or coldness in the foot and ankle.  Blue, gray, or dark color appears in the toenails.  Signs of infection develop, including fever, increased pain, swelling, redness, drainage, or bleeding in the surgical area.  New, unexplained symptoms develop (drugs used in treatment may produce side effects including bleeding, stomach upset, and allergic reactions).   This information is not intended to replace advice given to you by your health care provider. Make sure you discuss any questions you have with your health care provider.   Document Released: 12/31/2004 Document Revised: 03/25/2011 Document Reviewed: 07/13/2014 Elsevier Interactive Patient Education 2016 [employer]  

## 2015-10-03 ENCOUNTER — Ambulatory Visit (INDEPENDENT_AMBULATORY_CARE_PROVIDER_SITE_OTHER): Payer: Medicare Other | Admitting: Endocrinology

## 2015-10-03 VITALS — BP 142/76 | HR 93 | Ht 68.0 in | Wt 276.0 lb

## 2015-10-03 DIAGNOSIS — E1165 Type 2 diabetes mellitus with hyperglycemia: Secondary | ICD-10-CM

## 2015-10-03 DIAGNOSIS — E118 Type 2 diabetes mellitus with unspecified complications: Secondary | ICD-10-CM

## 2015-10-03 DIAGNOSIS — IMO0002 Reserved for concepts with insufficient information to code with codable children: Secondary | ICD-10-CM

## 2015-10-03 DIAGNOSIS — Z794 Long term (current) use of insulin: Secondary | ICD-10-CM

## 2015-10-03 MED ORDER — INSULIN LISPRO 100 UNIT/ML (KWIKPEN): 35.0000 [IU] | PEN_INJECTOR | Freq: Every day | SUBCUTANEOUS | 3 refills | Status: DC

## 2015-10-03 MED ORDER — INSULIN GLARGINE 100 UNIT/ML SOLOSTAR PEN: 50.0000 [IU] | PEN_INJECTOR | SUBCUTANEOUS | 3 refills | Status: DC

## 2015-10-03 NOTE — Patient Instructions (Addendum)
check your blood sugar twice a day.  vary the time of day when you check, between before the 3 meals, and at bedtime.  also check if you have symptoms of your blood sugar being too high or too low.  please keep a record of the readings and bring it to your next appointment here (or you can bring the meter itself).  You can write it on any piece of paper.  please call us sooner if your blood sugar goes below 70, or if you have a lot of readings over 200.  For now:   Please increase the lantus to 50 units each morning, and:   take humalog to 35 units with the supper only.   Please continue to pursue the weight loss surgery.  Please come back for a follow-up appointment in 2 months.

## 2015-10-03 NOTE — Progress Notes (Signed)
Subjective:    Patient ID: Bethany [name], female    DOB: May 07, 1945, 70 y.o.   MRN: [id number]  HPI Pt returns for f/u of diabetes mellitus: DM type: Insulin-requiring type 2 Dx'ed: 1998 Complications: polyneuropathy Therapy: insulin since 2009 GDM: never DKA: never Severe hypoglycemia: never Pancreatitis: never Other: she takes multiple daily injections, but she often eats just 2 meals per day. Interval history: no cbg record, but states cbg's vary from 92-200's.  She had a steroid injection into the right foot 1 week ago, for pain there.  cbg's are worse since then.  She is pursuing weight loss surgery.  Past Medical History:  Diagnosis Date  . Arthritis   . Diabetes mellitus without complication (HCC)    Type II  . Fibromyalgia   . GERD (gastroesophageal reflux disease)    better after gall bladder surgery-   . Hyperlipidemia   . Hypertension   . Shortness of breath dyspnea    with exertion    Past Surgical History:  Procedure Laterality Date  . ABDOMINAL HYSTERECTOMY  1997  . CATARACT EXTRACTION Bilateral 2014  . CHOLECYSTECTOMY  2010  . COLONOSCOPY    . COLONOSCOPY WITH PROPOFOL N/A 04/21/2015   Procedure: COLONOSCOPY WITH PROPOFOL;  Surgeon: Wallace Cullens, MD;  Location[facility] ENDOSCOPY;  Service: Gastroenterology;  Laterality: N/A;  . EYE SURGERY Bilateral    Cataract  . LUMBAR WOUND DEBRIDEMENT N/A 10/20/2014   Procedure: LUMBAR WOUND DEBRIDEMENT;  Surgeon: Tia Alert, MD;  Location: [facility] NEURO ORS;  Service: Neurosurgery;  Laterality: N/A;  . MAXIMUM ACCESS (MAS)POSTERIOR LUMBAR INTERBODY FUSION (PLIF) 1 LEVEL N/A 09/16/2014   Procedure: L/4-5 FOR MAXIMUM ACCESS (MAS) POSTERIOR LUMBAR INTERBODY FUSION (PLIF) 1 LEVEL;  Surgeon: Tia Alert, MD;  Location: [facility] NEURO ORS;  Service: Neurosurgery;  Laterality: N/A;  L/4-5 FOR MAXIMUM ACCESS (MAS) POSTERIOR LUMBAR INTERBODY FUSION (PLIF) 1 LEVEL    Social History   Social History  . Marital status: Married    Spouse  name: N/A  . Number of children: N/A  . Years of education: N/A   Occupational History  . Not on file.   Social History Main Topics  . Smoking status: Never Smoker  . Smokeless tobacco: Never Used  . Alcohol use No  . Drug use: No  . Sexual activity: Not Currently   Other Topics Concern  . Not on file   Social History Narrative  . No narrative on file    Current Outpatient Prescriptions on File Prior to Visit  Medication Sig Dispense Refill  . amLODipine (NORVASC) 10 MG tablet TAKE 1 TABLET BY MOUTH DAILY 30 tablet 4  . aspirin 81 MG tablet Take 81 mg by mouth daily. Reported on 03/29/2015    . atorvastatin (LIPITOR) 20 MG tablet TAKE 1 TABLET BY MOUTH DAILY 90 tablet 0  . docusate sodium (COLACE) 100 MG capsule Take 100 mg by mouth daily as needed for mild constipation. Reported on 03/29/2015    . furosemide (LASIX) 20 MG tablet Take 1 tablet (20 mg total) by mouth daily as needed. (Patient taking differently: Take 20 mg by mouth daily as needed for fluid. ) 30 tablet 2  . losartan (COZAAR) 50 MG tablet TAKE 1 TABLET BY MOUTH DAILY 90 tablet 0  . naproxen sodium (ANAPROX) 220 MG tablet Take 220 mg by mouth as needed (for pain).     . Omega-3 Fatty Acids (FISH OIL PO) Take 2,400 mg by mouth daily.  Current Facility-Administered Medications on File Prior to Visit  Medication Dose Route Frequency Provider Last Rate Last Dose  . betamethasone acetate-betamethasone sodium phosphate (CELESTONE) injection 12 mg  12 mg Intramuscular Once Felecia ShellingBrent M Evans, DPM        No Known Allergies  Family History  Problem Relation Age of Onset  . Arthritis Mother   . Cancer Mother     Ovarian  . Diabetes Mother   . Heart disease Father   . Hypertension Father     BP (!) 142/76   Pulse 93   Ht 5\' 8"  (1.727 m)   Wt 276 lb (125.2 kg)   SpO2 98%   BMI 41.97 kg/m   Review of Systems She has lost a few lbs.      Objective:   Physical Exam VITAL SIGNS:  See vs page GENERAL: no  distress.  Morbid obesity.   Pulses: dorsalis pedis intact bilat.   MSK: no deformity of the feet CV: 1+ bilat leg edema Skin:  no ulcer on the feet.  normal color and temp on the feet. Neuro: sensation is intact to touch on the feet, but decreased from normal Ext: There is bilateral onychomycosis of the toenails.      Assessment & Plan:  Insulin-requiring type 2 DM: ongoing poor control: she needs a simpler regimen. Foot pain: new to me: glycemic control is worsened by recent steroid injection.

## 2015-10-05 ENCOUNTER — Other Ambulatory Visit: Payer: Self-pay | Admitting: Internal Medicine

## 2015-10-24 ENCOUNTER — Encounter: Payer: Self-pay | Admitting: Podiatry

## 2015-10-24 ENCOUNTER — Ambulatory Visit (INDEPENDENT_AMBULATORY_CARE_PROVIDER_SITE_OTHER): Payer: Medicare Other | Admitting: Podiatry

## 2015-10-24 VITALS — BP 156/83 | HR 69 | Resp 16

## 2015-10-24 DIAGNOSIS — M25572 Pain in left ankle and joints of left foot: Secondary | ICD-10-CM

## 2015-10-24 DIAGNOSIS — M2142 Flat foot [pes planus] (acquired), left foot: Secondary | ICD-10-CM

## 2015-10-24 DIAGNOSIS — M7752 Other enthesopathy of left foot: Secondary | ICD-10-CM

## 2015-10-24 DIAGNOSIS — M659 Synovitis and tenosynovitis, unspecified: Secondary | ICD-10-CM | POA: Diagnosis not present

## 2015-10-24 DIAGNOSIS — E1143 Type 2 diabetes mellitus with diabetic autonomic (poly)neuropathy: Secondary | ICD-10-CM

## 2015-10-24 DIAGNOSIS — M25571 Pain in right ankle and joints of right foot: Secondary | ICD-10-CM

## 2015-10-24 DIAGNOSIS — M2141 Flat foot [pes planus] (acquired), right foot: Secondary | ICD-10-CM

## 2015-10-24 DIAGNOSIS — M7751 Other enthesopathy of right foot: Secondary | ICD-10-CM

## 2015-10-25 MED ORDER — BETAMETHASONE SOD PHOS & ACET 6 (3-3) MG/ML IJ SUSP: 3.0000 mg | Freq: Once | INTRAMUSCULAR | Status: DC

## 2015-10-25 NOTE — Addendum Note (Signed)
Addended by: Felecia ShellingEVANS, BRENT M on: 10/25/2015 06:31 AM   Modules accepted: Orders

## 2015-10-25 NOTE — Progress Notes (Signed)
Subjective:  Patient with a history of diabetes presents today for pain and tenderness to the bilateral ankles. Patient relates significant pain and tenderness when walking.  Patient states that the injection into the right sesamoidal apparatus helped. She no longer experiences pain. She also states that the plantar fascial bands helped.  Patient presents for further treatment and evaluation.   Objective / Physical Exam:  General:  The patient is alert and oriented x3 in no acute distress. Dermatology:  Skin is warm, dry and supple bilateral lower extremities. Negative for open lesions or macerations. Vascular:  Palpable pedal pulses bilaterally. No edema or erythema noted. Capillary refill within normal limits. Neurological:  Epicritic and protective threshold grossly intact bilaterally.  Musculoskeletal Exam:  Pain on palpation to the anterior lateral medial aspects of the patient's bilateral ankles. Mild edema noted.  Range of motion within normal limits to all pedal and ankle joints bilateral. Muscle strength 5/5 in all groups bilateral.   Assessment: #1 pain in bilateral ankles  #2 synovitis bilateral ankles #3 capsulitis bilateral ankles  #3 sesamoiditis right great toe-resolved #4 pes planus bilateral  Plan of Care:  #1 Patient was evaluated. #2 injection of 0.5 mL Celestone Soluspan injected in the patient's bilateral ankle joints. #3 prescription for anti-inflammatory pain cream dispensed through [facility]  #4 today the paperwork for diabetic shoes with insoles was completed and the processed for approval was initiated #5 patient is to return to clinic in 6-8 weeks for diabetic shoe fitting and pickup.   Dr. Felecia ShellingBrent M. Sohana Austell, DPM [facility]

## 2015-11-23 ENCOUNTER — Other Ambulatory Visit: Payer: Self-pay | Admitting: Internal Medicine

## 2015-12-03 NOTE — Progress Notes (Signed)
Subjective:    Patient ID: Bethany [name], female    DOB: 03-19-1945, 70 y.o.   MRN: [id number]  HPI Pt returns for f/u of diabetes mellitus: DM type: Insulin-requiring type 2 Dx'ed: 1998 Complications: polyneuropathy Therapy: insulin since 2009 GDM: never DKA: never Severe hypoglycemia: never Pancreatitis: never Other: she takes QD insulin, after poor results with multiple daily injections, but she often eats just 2 meals per day. Interval history: no cbg record, but states cbg's vary from 60-300's.  It is in general lowest fasting. She misses approx 2-3 insulin doses per week (usually the PM injection).  She had 2 more steroid injections, approx 2-3 weeks ago. Past Medical History:  Diagnosis Date  . Arthritis   . Diabetes mellitus without complication (HCC)    Type II  . Fibromyalgia   . GERD (gastroesophageal reflux disease)    better after gall bladder surgery-   . Hyperlipidemia   . Hypertension   . Shortness of breath dyspnea    with exertion    Past Surgical History:  Procedure Laterality Date  . ABDOMINAL HYSTERECTOMY  1997  . CATARACT EXTRACTION Bilateral 2014  . CHOLECYSTECTOMY  2010  . COLONOSCOPY    . COLONOSCOPY WITH PROPOFOL N/A 04/21/2015   Procedure: COLONOSCOPY WITH PROPOFOL;  Surgeon: Wallace CullensPaul Y Oh, MD;  Location[facility]COPY;  Service: Gastroenterology;  Laterality: N/A;  . EYE SURGERY Bilateral    Cataract  . LUMBAR WOUND DEBRIDEMENT N/A 10/20/2014   Procedure: LUMBAR WOUND DEBRIDEMENT;  Surgeon: Tia Alertavid S Jones, MD;  Location: [facility] NEURO ORS;  Service: Neurosurgery;  Laterality: N/A;  . MAXIMUM ACCESS (MAS)POSTERIOR LUMBAR INTERBODY FUSION (PLIF) 1 LEVEL N/A 09/16/2014   Procedure: L/4-5 FOR MAXIMUM ACCESS (MAS) POSTERIOR LUMBAR INTERBODY FUSION (PLIF) 1 LEVEL;  Surgeon: Tia Alertavid S Jones, MD;  Location: [facility] NEURO ORS;  Service: Neurosurgery;  Laterality: N/A;  L/4-5 FOR MAXIMUM ACCESS (MAS) POSTERIOR LUMBAR INTERBODY FUSION (PLIF) 1 LEVEL    Social History    Social History  . Marital status: Married    Spouse name: N/A  . Number of children: N/A  . Years of education: N/A   Occupational History  . Not on file.   Social History Main Topics  . Smoking status: Never Smoker  . Smokeless tobacco: Never Used  . Alcohol use No  . Drug use: No  . Sexual activity: Not Currently   Other Topics Concern  . Not on file   Social History Narrative  . No narrative on file    Current Outpatient Prescriptions on File Prior to Visit  Medication Sig Dispense Refill  . amLODipine (NORVASC) 10 MG tablet TAKE 1 TABLET BY MOUTH DAILY 30 tablet 1  . aspirin 81 MG tablet Take 81 mg by mouth daily. Reported on 03/29/2015    . atorvastatin (LIPITOR) 20 MG tablet TAKE 1 TABLET BY MOUTH DAILY 90 tablet 3  . docusate sodium (COLACE) 100 MG capsule Take 100 mg by mouth daily as needed for mild constipation. Reported on 03/29/2015    . furosemide (LASIX) 20 MG tablet Take 1 tablet (20 mg total) by mouth daily as needed. (Patient taking differently: Take 20 mg by mouth daily as needed for fluid. ) 30 tablet 2  . losartan (COZAAR) 50 MG tablet TAKE 1 TABLET BY MOUTH DAILY 90 tablet 3  . naproxen sodium (ANAPROX) 220 MG tablet Take 220 mg by mouth as needed (for pain).     . Omega-3 Fatty Acids (FISH OIL PO) Take 2,400  mg by mouth daily.      Current Facility-Administered Medications on File Prior to Visit  Medication Dose Route Frequency Provider Last Rate Last Dose  . betamethasone acetate-betamethasone sodium phosphate (CELESTONE) injection 12 mg  12 mg Intramuscular Once Felecia ShellingBrent M Evans, DPM      . betamethasone acetate-betamethasone sodium phosphate (CELESTONE) injection 3 mg  3 mg Intramuscular Once Felecia ShellingBrent M Evans, DPM        No Known Allergies  Family History  Problem Relation Age of Onset  . Arthritis Mother   . Cancer Mother     Ovarian  . Diabetes Mother   . Heart disease Father   . Hypertension Father     BP (!) 146/82   Pulse 84   Ht 5\' 8"   (1.727 m)   Wt 285 lb (129.3 kg)   SpO2 97%   BMI 43.33 kg/m    Review of Systems Denies LOC    Objective:   Physical Exam VITAL SIGNS:  See vs page GENERAL: no distress.  Morbid obesity.   Pulses: dorsalis pedis intact bilat.   MSK: no deformity of the feet CV: 1+ bilat leg edema Skin:  no ulcer on the feet.  normal color and temp on the feet. Neuro: sensation is intact to touch on the feet, but decreased from normal Ext: There is bilateral onychomycosis of the toenails.    A1c=10.1%    Assessment & Plan:  insulin-requiring type 2 DM, with polyneuropathy: worse Ankle pain.  steroid injections are compromising glycemic control Noncompliance with cbg recording and insulin: she needs a simpler regimen Obesity: persistent.  Patient is advised the following: Patient Instructions  check your blood sugar twice a day.  vary the time of day when you check, between before the 3 meals, and at bedtime.  also check if you have symptoms of your blood sugar being too high or too low.  please keep a record of the readings and bring it to your next appointment here (or you can bring the meter itself).  You can write it on any piece of paper.  please call us sooner if your blood sugar goes below 70, or if you have a lot of readings over 200.  For now:   Please change both current insulins to "NPH," 70 units each morning.  This is probably not enough, so call if your blood sugar stays high. On this type of insulin schedule, you should eat meals on a regular schedule.  If a meal is missed or significantly delayed, your blood sugar could go low.  Please continue to pursue the weight loss surgery.  Please come back for a follow-up appointment in 2 months.

## 2015-12-05 ENCOUNTER — Encounter: Payer: Self-pay | Admitting: Endocrinology

## 2015-12-05 ENCOUNTER — Telehealth: Payer: Self-pay | Admitting: Endocrinology

## 2015-12-05 ENCOUNTER — Ambulatory Visit (INDEPENDENT_AMBULATORY_CARE_PROVIDER_SITE_OTHER): Payer: Medicare Other | Admitting: Endocrinology

## 2015-12-05 VITALS — BP 146/82 | HR 84 | Ht 68.0 in | Wt 285.0 lb

## 2015-12-05 DIAGNOSIS — Z794 Long term (current) use of insulin: Secondary | ICD-10-CM

## 2015-12-05 DIAGNOSIS — E1165 Type 2 diabetes mellitus with hyperglycemia: Secondary | ICD-10-CM

## 2015-12-05 DIAGNOSIS — E118 Type 2 diabetes mellitus with unspecified complications: Secondary | ICD-10-CM

## 2015-12-05 DIAGNOSIS — IMO0002 Reserved for concepts with insufficient information to code with codable children: Secondary | ICD-10-CM

## 2015-12-05 LAB — POCT GLYCOSYLATED HEMOGLOBIN (HGB A1C): Hemoglobin A1C: 10.1

## 2015-12-05 MED ORDER — GLUCOSE BLOOD VI STRP: 1.0000 | ORAL_STRIP | Freq: Two times a day (BID) | 12 refills | Status: DC

## 2015-12-05 MED ORDER — INSULIN DETEMIR 100 UNIT/ML FLEXPEN: 100.0000 [IU] | PEN_INJECTOR | SUBCUTANEOUS | 11 refills | Status: DC

## 2015-12-05 MED ORDER — INSULIN ISOPHANE HUMAN 100 UNIT/ML KWIKPEN: 70.0000 [IU] | PEN_INJECTOR | SUBCUTANEOUS | 11 refills | Status: DC

## 2015-12-05 NOTE — Telephone Encounter (Signed)
Attempted to reach the patient was not available and did not have a working Engineer, technical salesvoicemail. Will try again at a later time.

## 2015-12-05 NOTE — Patient Instructions (Addendum)
check your blood sugar twice a day.  vary the time of day when you check, between before the 3 meals, and at bedtime.  also check if you have symptoms of your blood sugar being too high or too low.  please keep a record of the readings and bring it to your next appointment here (or you can bring the meter itself).  You can write it on any piece of paper.  please call us sooner if your blood sugar goes below 70, or if you have a lot of readings over 200.  For now:   Please change both current insulins to "NPH," 70 units each morning.  This is probably not enough, so call if your blood sugar stays high. On this type of insulin schedule, you should eat meals on a regular schedule.  If a meal is missed or significantly delayed, your blood sugar could go low.  Please continue to pursue the weight loss surgery.  Please come back for a follow-up appointment in 2 months.

## 2015-12-05 NOTE — Telephone Encounter (Signed)
See message, would you like to proceed with the PA?

## 2015-12-05 NOTE — Telephone Encounter (Signed)
Ok, it looks like ins prefers levemir.  I have sent a prescription to your pharmacy.  This insulin needs a higher number of units, so we have to guess how much.  Please call next week, to report cbg's.

## 2015-12-05 NOTE — Telephone Encounter (Signed)
Patient need PA for Insulin NPH, Human,, Isophane, (HUMULIN N KWIKPEN) 100 UNIT/ML [address]  [facility] [place] - [id number] [phone] (Phone) [phone] (Fax)    glucose blood (FREESTYLE LITE) test strip  [facility] [place] [place], [place] - [id number] [phone] (Phone) [phone] (Fax)

## 2015-12-06 NOTE — Telephone Encounter (Signed)
I contacted the patient and advised of message. Patient voiced understanding and had no further questions at this time.  

## 2015-12-11 ENCOUNTER — Encounter: Payer: Self-pay | Admitting: Internal Medicine

## 2015-12-11 ENCOUNTER — Ambulatory Visit: Payer: Medicare Other | Admitting: Internal Medicine

## 2015-12-11 MED ORDER — INSULIN DETEMIR 100 UNIT/ML FLEXPEN: 100.0000 [IU] | PEN_INJECTOR | SUBCUTANEOUS | 11 refills | Status: DC

## 2015-12-13 ENCOUNTER — Other Ambulatory Visit: Payer: Self-pay

## 2015-12-13 MED ORDER — ONETOUCH DELICA LANCETS 33G MISC: 2 refills | Status: DC

## 2015-12-13 MED ORDER — ONETOUCH ULTRA SYSTEM W/DEVICE KIT: PACK | 2 refills | Status: DC

## 2015-12-13 MED ORDER — GLUCOSE BLOOD VI STRP: ORAL_STRIP | 2 refills | Status: DC

## 2015-12-21 ENCOUNTER — Telehealth: Payer: Self-pay | Admitting: Endocrinology

## 2015-12-21 NOTE — Telephone Encounter (Signed)
Pt called in and said that the new insulin that Dr. Everardo AllEllison ordered for her will not be covered under her insurance.  She could not recall the name, but she said it was the newest one.

## 2015-12-21 NOTE — Telephone Encounter (Signed)
See message and please advise, Thanks!  

## 2015-12-21 NOTE — Telephone Encounter (Signed)
Please verify pt is taking levemir.  If so, I need to know what ins prefers

## 2015-12-22 ENCOUNTER — Other Ambulatory Visit: Payer: Self-pay | Admitting: Endocrinology

## 2015-12-22 MED ORDER — INSULIN ISOPHANE HUMAN 100 UNIT/ML KWIKPEN: 70.0000 [IU] | PEN_INJECTOR | SUBCUTANEOUS | 11 refills | Status: DC

## 2015-12-22 NOTE — Telephone Encounter (Signed)
Ok, I have sent a prescription to your pharmacy.  This replaces the levemir.

## 2015-12-22 NOTE — Telephone Encounter (Signed)
I contacted the patient and advised of message. Patient verbalized understanding and agreed to the NPH being sent. Patient would like th rx submitted to [facility] on [address] in [place].

## 2015-12-22 NOTE — Telephone Encounter (Signed)
I contacted the patient and she confirmed the prescription was for the Levemir. 2017 Formulary placed on your desk to review.

## 2015-12-22 NOTE — Telephone Encounter (Signed)
Patient advised.

## 2015-12-22 NOTE — Telephone Encounter (Signed)
Formulary likes humulin NPH.  OK to send?  Do you prefer vial or pen?

## 2016-01-09 ENCOUNTER — Other Ambulatory Visit: Payer: Self-pay | Admitting: Internal Medicine

## 2016-02-03 NOTE — Progress Notes (Signed)
Subjective:    Patient ID: Bethany [name], female    DOB: 1945-11-24, 71 y.o.   MRN: [id number]  HPI Pt returns for f/u of diabetes mellitus: DM type: Insulin-requiring type 2 Dx'ed: 5573 Complications: polyneuropathy.  Therapy: insulin since 2009 GDM: never DKA: never Severe hypoglycemia: never Pancreatitis: never Other: she takes QD insulin, after poor results with multiple daily injections, but she often eats just 2 meals per day.  Interval history: She never misses the insulin.  No recent steroids.  no cbg record, but states she has mild hypoglycemia approx once per week, usually before supper.  It is highest at lunch.   Past Medical History:  Diagnosis Date  . Arthritis   . Diabetes mellitus without complication ([place])    Type II  . Fibromyalgia   . GERD (gastroesophageal reflux disease)    better after gall bladder surgery-   . Hyperlipidemia   . Hypertension   . Shortness of breath dyspnea    with exertion    Past Surgical History:  Procedure Laterality Date  . ABDOMINAL HYSTERECTOMY  1997  . CATARACT EXTRACTION Bilateral 2014  . CHOLECYSTECTOMY  2010  . COLONOSCOPY    . COLONOSCOPY WITH PROPOFOL N/A 04/21/2015   Procedure: COLONOSCOPY WITH PROPOFOL;  Surgeon: Hulen Luster, MD;  Location: [facility] ENDOSCOPY;  Service: Gastroenterology;  Laterality: N/A;  . EYE SURGERY Bilateral    Cataract  . LUMBAR WOUND DEBRIDEMENT N/A 10/20/2014   Procedure: LUMBAR WOUND DEBRIDEMENT;  Surgeon: Eustace Moore, MD;  Location: [place] NEURO ORS;  Service: Neurosurgery;  Laterality: N/A;  . MAXIMUM ACCESS (MAS)POSTERIOR LUMBAR INTERBODY FUSION (PLIF) 1 LEVEL N/A 09/16/2014   Procedure: L/4-5 FOR MAXIMUM ACCESS (MAS) POSTERIOR LUMBAR INTERBODY FUSION (PLIF) 1 LEVEL;  Surgeon: Eustace Moore, MD;  Location: [facility] NEURO ORS;  Service: Neurosurgery;  Laterality: N/A;  L/4-5 FOR MAXIMUM ACCESS (MAS) POSTERIOR LUMBAR INTERBODY FUSION (PLIF) 1 LEVEL    Social History   Social History  . Marital status:  Married    Spouse name: N/A  . Number of children: N/A  . Years of education: N/A   Occupational History  . Not on file.   Social History Main Topics  . Smoking status: Never Smoker  . Smokeless tobacco: Never Used  . Alcohol use No  . Drug use: No  . Sexual activity: Not Currently   Other Topics Concern  . Not on file   Social History Narrative  . No narrative on file    Current Outpatient Prescriptions on File Prior to Visit  Medication Sig Dispense Refill  . amLODipine (NORVASC) 10 MG tablet TAKE 1 TABLET BY MOUTH DAILY 30 tablet 1  . aspirin 81 MG tablet Take 81 mg by mouth daily. Reported on 03/29/2015    . atorvastatin (LIPITOR) 20 MG tablet TAKE 1 TABLET BY MOUTH DAILY 90 tablet 3  . Blood Glucose Monitoring Suppl (ONE TOUCH ULTRA SYSTEM KIT) w/Device KIT 2  times daily. E11.0=9 1 each 2  . docusate sodium (COLACE) 100 MG capsule Take 100 mg by mouth daily as needed for mild constipation. Reported on 03/29/2015    . furosemide (LASIX) 20 MG tablet TAKE ONE (1) TABLET BY MOUTH DAILY AS NEEDED 30 tablet 0  . glucose blood (FREESTYLE LITE) test strip 1 each by Other route 2 (two) times daily. And lancets 2/day 100 each 12  . glucose blood (ONE TOUCH ULTRA TEST) test strip 2 times daily. Dx Code: e11.9 200 each 2  .  losartan (COZAAR) 50 MG tablet TAKE 1 TABLET BY MOUTH DAILY 90 tablet 3  . naproxen sodium (ANAPROX) 220 MG tablet Take 220 mg by mouth as needed (for pain).     . Omega-3 Fatty Acids (FISH OIL PO) Take 2,400 mg by mouth daily.     Glory Rosebush DELICA LANCETS 79G MISC 2 times daily. Dx Code: e11.9 200 each 2   Current Facility-Administered Medications on File Prior to Visit  Medication Dose Route Frequency Provider Last Rate Last Dose  . betamethasone acetate-betamethasone sodium phosphate (CELESTONE) injection 12 mg  12 mg Intramuscular Once Edrick Kins, DPM      . betamethasone acetate-betamethasone sodium phosphate (CELESTONE) injection 3 mg  3 mg Intramuscular  Once Edrick Kins, DPM        No Known Allergies  Family History  Problem Relation Age of Onset  . Arthritis Mother   . Cancer Mother     Ovarian  . Diabetes Mother   . Heart disease Father   . Hypertension Father     BP 140/82   Pulse 90   Ht 5' 8"  (1.727 m)   Wt 289 lb (131.1 kg)   SpO2 99%   BMI 43.94 kg/m    Review of Systems Denies LOC.      Objective:   Physical Exam VITAL SIGNS:  See vs page.  GENERAL: no distress.  Morbid obesity.   Pulses: dorsalis pedis intact bilat.   MSK: no deformity of the feet.   CV: 1+ bilat leg edema.  Skin:  no ulcer on the feet.  normal color and temp on the feet.  Neuro: sensation is intact to touch on the feet, but decreased from normal.   Ext: There is bilateral onychomycosis of the toenails.   A1c=8.6%    Assessment & Plan:  Insulin-requiring type 2 DM, with polyneuropathy: improved. Hypoglycemia: this is precluding increased insulin for now. Noncompliance with cbg recording: prior to increasing insulin, we need more cbg info.  Obesity: persistent  Patient is advised the following: Patient Instructions  check your blood sugar twice a day.  vary the time of day when you check, between before the 3 meals, and at bedtime.  also check if you have symptoms of your blood sugar being too high or too low.  please keep a record of the readings and bring it to your next appointment here (or you can bring the meter itself).  You can write it on any piece of paper.  please call us sooner if your blood sugar goes below 70, or if you have a lot of readings over 200.  For now, please continue the same insulin.   On this type of insulin schedule, you should eat meals on a regular schedule.  If a meal is missed or significantly delayed, your blood sugar could go low.   Please continue to pursue the weight loss surgery.  Please come back for a follow-up appointment in 2 months.

## 2016-02-05 ENCOUNTER — Encounter: Payer: Self-pay | Admitting: Endocrinology

## 2016-02-05 ENCOUNTER — Ambulatory Visit (INDEPENDENT_AMBULATORY_CARE_PROVIDER_SITE_OTHER): Payer: Medicare Other | Admitting: Endocrinology

## 2016-02-05 VITALS — BP 140/82 | HR 90 | Ht 68.0 in | Wt 289.0 lb

## 2016-02-05 DIAGNOSIS — IMO0002 Reserved for concepts with insufficient information to code with codable children: Secondary | ICD-10-CM

## 2016-02-05 DIAGNOSIS — E1165 Type 2 diabetes mellitus with hyperglycemia: Secondary | ICD-10-CM | POA: Diagnosis not present

## 2016-02-05 DIAGNOSIS — E118 Type 2 diabetes mellitus with unspecified complications: Secondary | ICD-10-CM

## 2016-02-05 DIAGNOSIS — Z794 Long term (current) use of insulin: Secondary | ICD-10-CM

## 2016-02-05 LAB — POCT GLYCOSYLATED HEMOGLOBIN (HGB A1C): Hemoglobin A1C: 8.6

## 2016-02-05 MED ORDER — INSULIN NPH (HUMAN) (ISOPHANE) 100 UNIT/ML ~~LOC~~ SUSP: 70.0000 [IU] | SUBCUTANEOUS | 11 refills | Status: DC

## 2016-02-05 NOTE — Patient Instructions (Addendum)
check your blood sugar twice a day.  vary the time of day when you check, between before the 3 meals, and at bedtime.  also check if you have symptoms of your blood sugar being too high or too low.  please keep a record of the readings and bring it to your next appointment here (or you can bring the meter itself).  You can write it on any piece of paper.  please call us sooner if your blood sugar goes below 70, or if you have a lot of readings over 200.  For now, please continue the same insulin.   On this type of insulin schedule, you should eat meals on a regular schedule.  If a meal is missed or significantly delayed, your blood sugar could go low.   Please continue to pursue the weight loss surgery.  Please come back for a follow-up appointment in 2 months.

## 2016-02-17 IMAGING — CR DG LUMBAR SPINE COMPLETE 4+V
5 series · 5 of 5 positions shown · non-contrast
Comparison: None.

CLINICAL DATA: History of sciatica with recurrent low back pain

EXAM:
LUMBAR SPINE - COMPLETE 4+ VIEW

[view not recorded (1 of 5)]
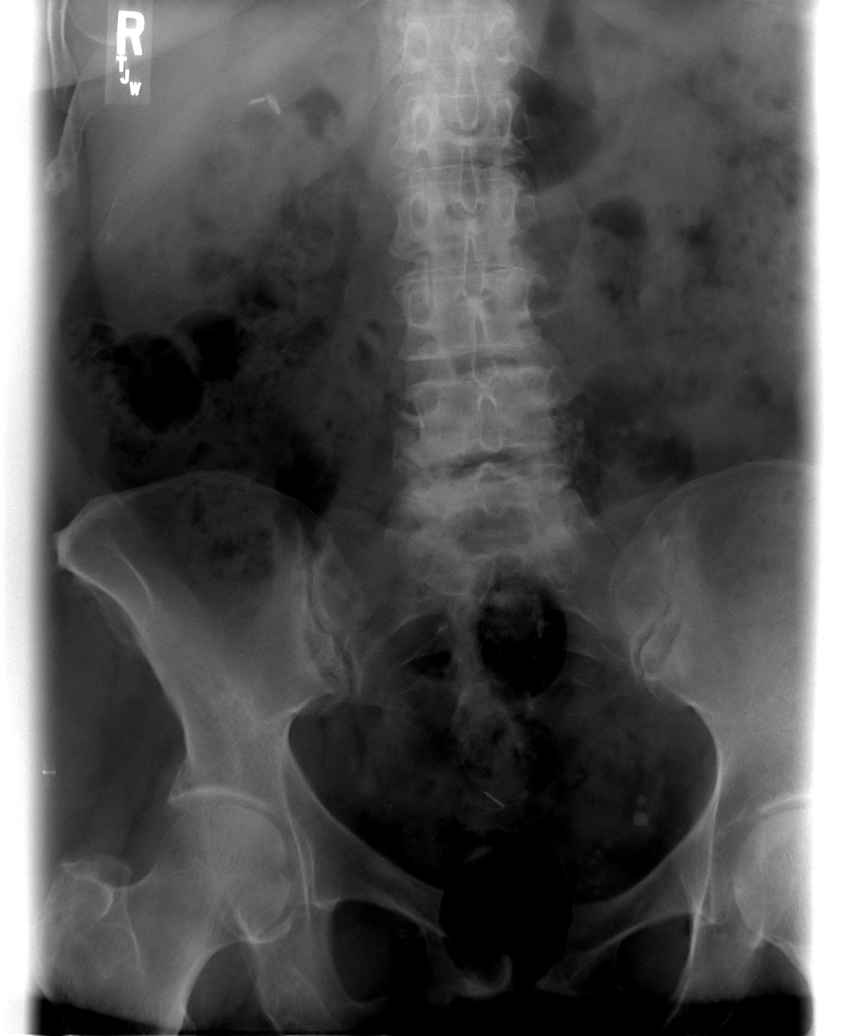

[view not recorded (2 of 5)]
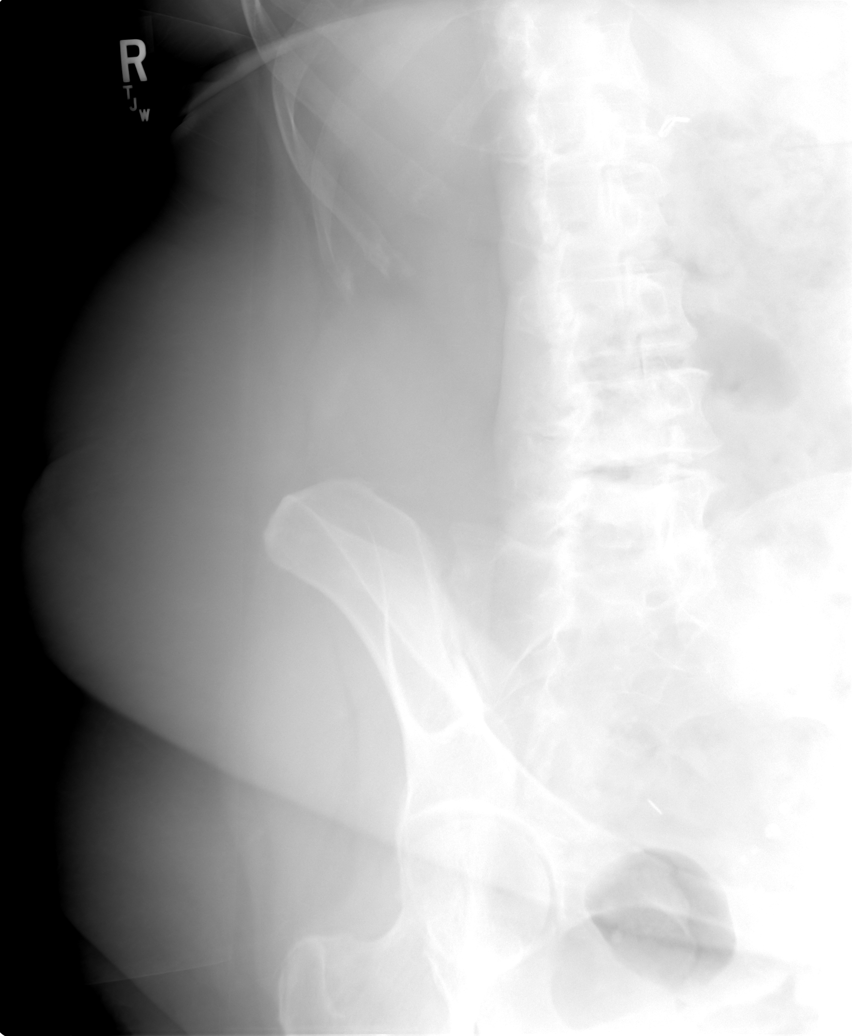

[view not recorded (3 of 5)]
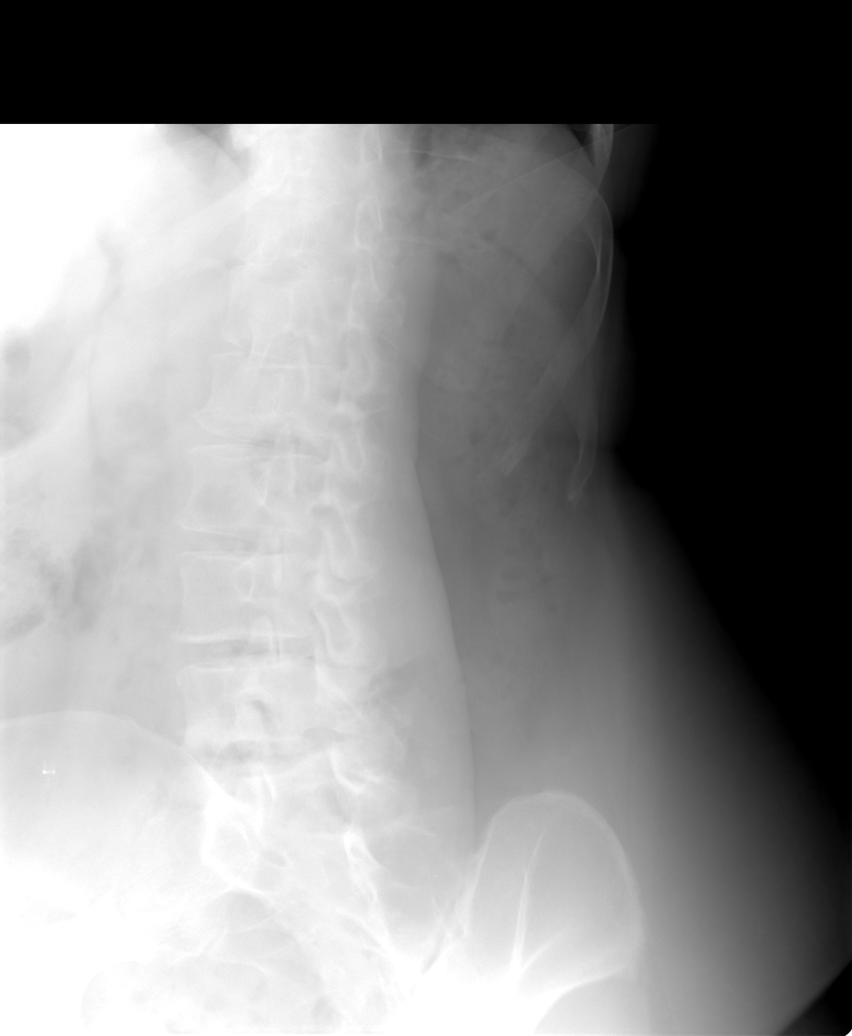

[view not recorded (4 of 5)]
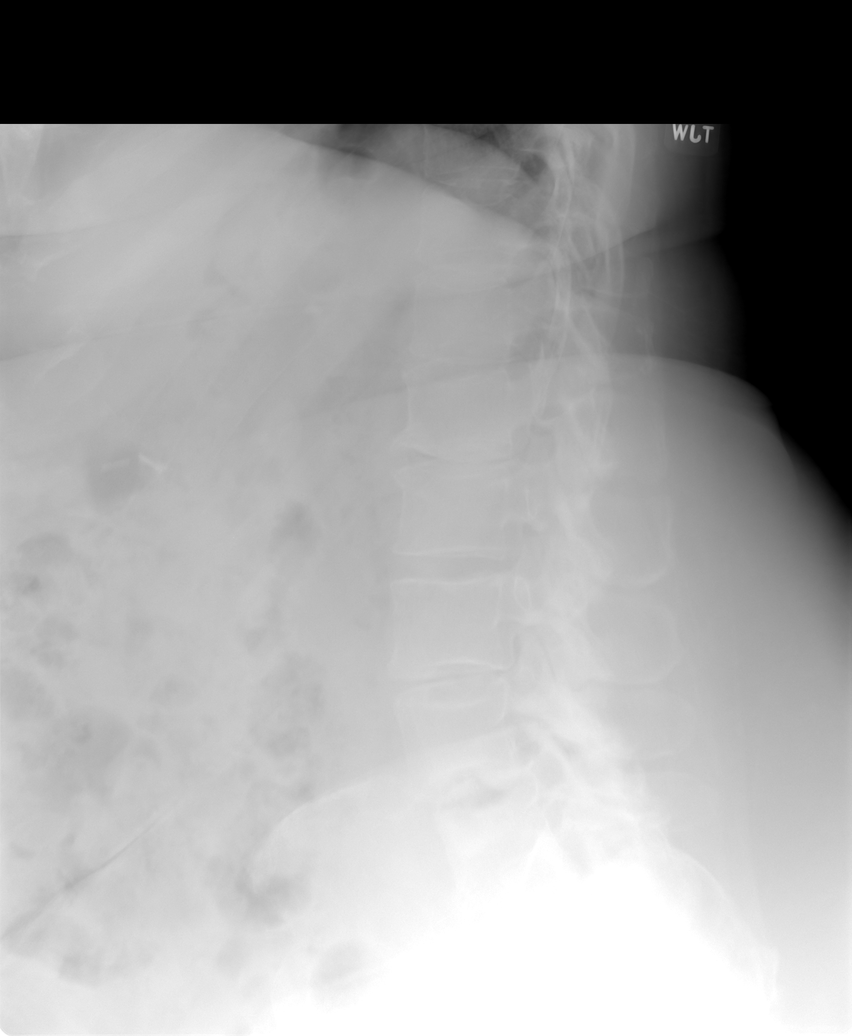

[view not recorded (5 of 5)]
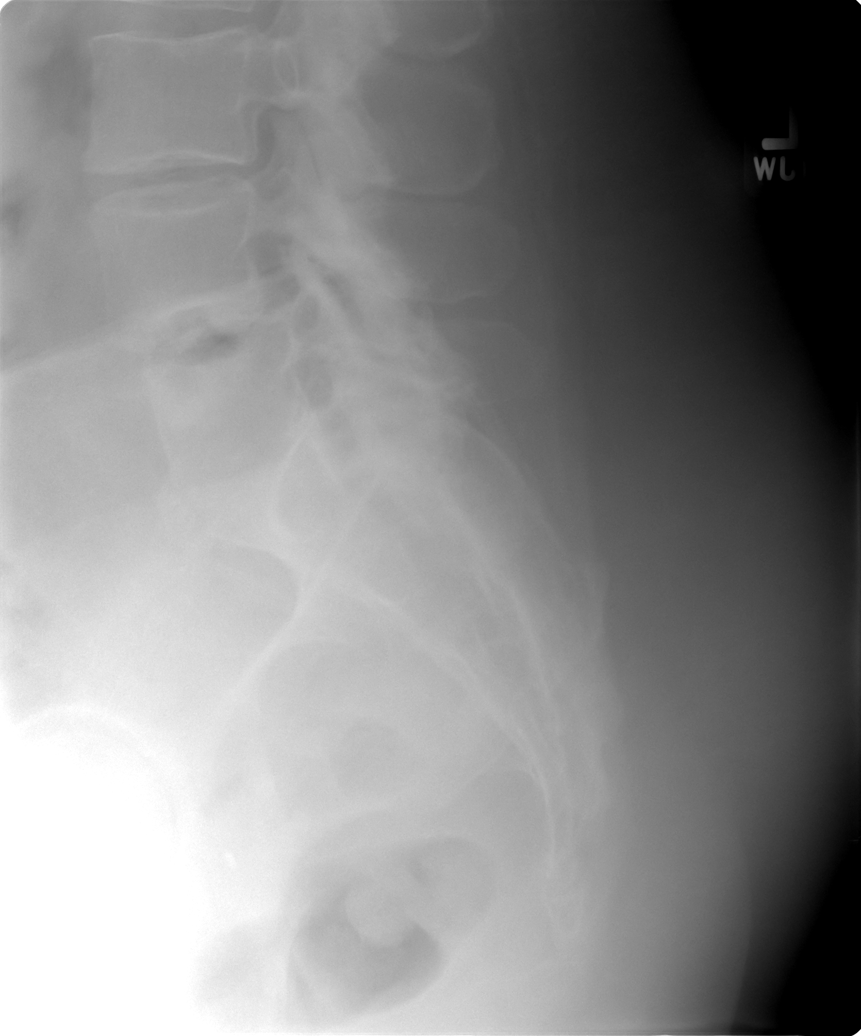

[5 of 5 positions shown; findings below may reference images not displayed]

FINDINGS: Mild convex right scoliosis of the lumbar spine. Mild bilateral L4-5
and and moderate L5-S1 degenerative facet change. Minimal anterior
listhesis of L4 on L5 likely due to degenerative facet change.
Moderate L1-2 degenerative disc disease. Mild L3-4 and
mild-to-moderate L4-5 degenerative disc disease. Mild to moderate
L5-S1 degenerative disc disease. Mild bilateral sacroiliac
degenerative change.
IMPRESSION: Degenerative changes as described above

## 2016-03-05 ENCOUNTER — Other Ambulatory Visit: Payer: Self-pay | Admitting: Internal Medicine

## 2016-04-04 ENCOUNTER — Ambulatory Visit (INDEPENDENT_AMBULATORY_CARE_PROVIDER_SITE_OTHER): Payer: Medicare Other | Admitting: Endocrinology

## 2016-04-04 ENCOUNTER — Encounter: Payer: Self-pay | Admitting: Endocrinology

## 2016-04-04 VITALS — BP 142/68 | HR 91 | Ht 68.0 in | Wt 292.0 lb

## 2016-04-04 DIAGNOSIS — E1165 Type 2 diabetes mellitus with hyperglycemia: Secondary | ICD-10-CM | POA: Diagnosis not present

## 2016-04-04 DIAGNOSIS — E118 Type 2 diabetes mellitus with unspecified complications: Secondary | ICD-10-CM | POA: Diagnosis not present

## 2016-04-04 DIAGNOSIS — Z794 Long term (current) use of insulin: Secondary | ICD-10-CM | POA: Diagnosis not present

## 2016-04-04 DIAGNOSIS — IMO0002 Reserved for concepts with insufficient information to code with codable children: Secondary | ICD-10-CM

## 2016-04-04 LAB — POCT GLYCOSYLATED HEMOGLOBIN (HGB A1C): Hemoglobin A1C: 7.7

## 2016-04-04 MED ORDER — INSULIN NPH ISOPHANE & REGULAR (70-30) 100 UNIT/ML ~~LOC~~ SUSP: 60.0000 [IU] | Freq: Every day | SUBCUTANEOUS | 11 refills | Status: DC

## 2016-04-04 NOTE — Patient Instructions (Signed)
check your blood sugar twice a day.  vary the time of day when you check, between before the 3 meals, and at bedtime.  also check if you have symptoms of your blood sugar being too high or too low.  please keep a record of the readings and bring it to your next appointment here (or you can bring the meter itself).  You can write it on any piece of paper.  please call us sooner if your blood sugar goes below 70, or if you have a lot of readings over 200.  Please change the insulin to "70/30," 60 units with breakfast  On this type of insulin schedule, you should eat meals on a regular schedule.  If a meal is missed or significantly delayed, your blood sugar could go low.   Please continue to pursue the weight loss surgery.  Please come back for a follow-up appointment in 2 months.

## 2016-04-04 NOTE — Progress Notes (Signed)
Subjective:    Patient ID: Bethany [name], female    DOB: 09-06-1945, 71 y.o.   MRN: [id number]  HPI Pt returns for f/u of diabetes mellitus: DM type: Insulin-requiring type 2 Dx'ed: 8563 Complications: polyneuropathy.  Therapy: insulin since 2009 GDM: never DKA: never Severe hypoglycemia: never Pancreatitis: never Other: she takes QD insulin, after poor results with multiple daily injections, but she often eats just 2 meals per day; she takes human insulin, due to cost.  Interval history: She never misses the insulin  No recent steroids. no cbg record, but states cbg's vary from 66-380.  It is lowest at 4 AM, and highest in the afternoon.  pt states she feels well in general.  She takes NPH, 70 units qam.  Past Medical History:  Diagnosis Date  . Arthritis   . Diabetes mellitus without complication ([place])    Type II  . Fibromyalgia   . GERD (gastroesophageal reflux disease)    better after gall bladder surgery-   . Hyperlipidemia   . Hypertension   . Shortness of breath dyspnea    with exertion    Past Surgical History:  Procedure Laterality Date  . ABDOMINAL HYSTERECTOMY  1997  . CATARACT EXTRACTION Bilateral 2014  . CHOLECYSTECTOMY  2010  . COLONOSCOPY    . COLONOSCOPY WITH PROPOFOL N/A 04/21/2015   Procedure: COLONOSCOPY WITH PROPOFOL;  Surgeon: Hulen Luster, MD;  Location: [facility] ENDOSCOPY;  Service: Gastroenterology;  Laterality: N/A;  . EYE SURGERY Bilateral    Cataract  . LUMBAR WOUND DEBRIDEMENT N/A 10/20/2014   Procedure: LUMBAR WOUND DEBRIDEMENT;  Surgeon: Eustace Moore, MD;  Location: [place] NEURO ORS;  Service: Neurosurgery;  Laterality: N/A;  . MAXIMUM ACCESS (MAS)POSTERIOR LUMBAR INTERBODY FUSION (PLIF) 1 LEVEL N/A 09/16/2014   Procedure: L/4-5 FOR MAXIMUM ACCESS (MAS) POSTERIOR LUMBAR INTERBODY FUSION (PLIF) 1 LEVEL;  Surgeon: Eustace Moore, MD;  Location: [place] NEURO ORS;  Service: Neurosurgery;  Laterality: N/A;  L/4-5 FOR MAXIMUM ACCESS (MAS) POSTERIOR LUMBAR INTERBODY  FUSION (PLIF) 1 LEVEL    Social History   Social History  . Marital status: Married    Spouse name: N/A  . Number of children: N/A  . Years of education: N/A   Occupational History  . Not on file.   Social History Main Topics  . Smoking status: Never Smoker  . Smokeless tobacco: Never Used  . Alcohol use No  . Drug use: No  . Sexual activity: Not Currently   Other Topics Concern  . Not on file   Social History Narrative  . No narrative on file    Current Outpatient Prescriptions on File Prior to Visit  Medication Sig Dispense Refill  . amLODipine (NORVASC) 10 MG tablet TAKE 1 TABLET BY MOUTH DAILY 30 tablet 3  . aspirin 81 MG tablet Take 81 mg by mouth daily. Reported on 03/29/2015    . atorvastatin (LIPITOR) 20 MG tablet TAKE 1 TABLET BY MOUTH DAILY 90 tablet 3  . Blood Glucose Monitoring Suppl (ONE TOUCH ULTRA SYSTEM KIT) w/Device KIT 2  times daily. E11.0=9 1 each 2  . docusate sodium (COLACE) 100 MG capsule Take 100 mg by mouth daily as needed for mild constipation. Reported on 03/29/2015    . furosemide (LASIX) 20 MG tablet TAKE ONE (1) TABLET BY MOUTH DAILY AS NEEDED 30 tablet 0  . glucose blood (FREESTYLE LITE) test strip 1 each by Other route 2 (two) times daily. And lancets 2/day 100 each 12  .  glucose blood (ONE TOUCH ULTRA TEST) test strip 2 times daily. Dx Code: e11.9 200 each 2  . losartan (COZAAR) 50 MG tablet TAKE 1 TABLET BY MOUTH DAILY 90 tablet 3  . naproxen sodium (ANAPROX) 220 MG tablet Take 220 mg by mouth as needed (for pain).     . Omega-3 Fatty Acids (FISH OIL PO) Take 2,400 mg by mouth daily.     Glory Rosebush DELICA LANCETS 24O MISC 2 times daily. Dx Code: e11.9 200 each 2   Current Facility-Administered Medications on File Prior to Visit  Medication Dose Route Frequency Provider Last Rate Last Dose  . betamethasone acetate-betamethasone sodium phosphate (CELESTONE) injection 12 mg  12 mg Intramuscular Once Edrick Kins, DPM      . betamethasone  acetate-betamethasone sodium phosphate (CELESTONE) injection 3 mg  3 mg Intramuscular Once Edrick Kins, DPM        No Known Allergies  Family History  Problem Relation Age of Onset  . Arthritis Mother   . Cancer Mother     Ovarian  . Diabetes Mother   . Heart disease Father   . Hypertension Father     BP (!) 142/68   Pulse 91   Ht 5' 8"  (1.727 m)   Wt 292 lb (132.5 kg)   SpO2 98%   BMI 44.40 kg/m   Review of Systems She denies LOC.     Objective:   Physical Exam VITAL SIGNS:  See vs page.  GENERAL: no distress.  Morbid obesity.   Pulses: dorsalis pedis intact bilat.   MSK: no deformity of the feet.   CV: 2+ bilat leg edema.  Skin:  no ulcer on the feet.  normal color and temp on the feet, but there is patchy hyperpigmentation of the legs Neuro: sensation is intact to touch on the feet, but decreased from normal.   Ext: There is bilateral onychomycosis of the toenails.   Lab Results  Component Value Date   HGBA1C 7.7 04/04/2016      Assessment & Plan:  Insulin-requiring type 2 DM: Based on the pattern of her cbg's, she needs faster-acting qd insulin.   Patient is advised the following: Patient Instructions  check your blood sugar twice a day.  vary the time of day when you check, between before the 3 meals, and at bedtime.  also check if you have symptoms of your blood sugar being too high or too low.  please keep a record of the readings and bring it to your next appointment here (or you can bring the meter itself).  You can write it on any piece of paper.  please call us sooner if your blood sugar goes below 70, or if you have a lot of readings over 200.  Please change the insulin to "70/30," 60 units with breakfast  On this type of insulin schedule, you should eat meals on a regular schedule.  If a meal is missed or significantly delayed, your blood sugar could go low.   Please continue to pursue the weight loss surgery.  Please come back for a follow-up  appointment in 2 months.

## 2016-06-04 ENCOUNTER — Ambulatory Visit (INDEPENDENT_AMBULATORY_CARE_PROVIDER_SITE_OTHER): Payer: Medicare Other | Admitting: Endocrinology

## 2016-06-04 ENCOUNTER — Encounter: Payer: Self-pay | Admitting: Endocrinology

## 2016-06-04 VITALS — BP 132/62 | HR 90 | Wt 296.0 lb

## 2016-06-04 DIAGNOSIS — E1165 Type 2 diabetes mellitus with hyperglycemia: Secondary | ICD-10-CM | POA: Diagnosis not present

## 2016-06-04 DIAGNOSIS — Z794 Long term (current) use of insulin: Secondary | ICD-10-CM

## 2016-06-04 DIAGNOSIS — E118 Type 2 diabetes mellitus with unspecified complications: Secondary | ICD-10-CM | POA: Diagnosis not present

## 2016-06-04 DIAGNOSIS — IMO0002 Reserved for concepts with insufficient information to code with codable children: Secondary | ICD-10-CM

## 2016-06-04 LAB — POCT GLYCOSYLATED HEMOGLOBIN (HGB A1C): Hemoglobin A1C: 8.3

## 2016-06-04 MED ORDER — INSULIN NPH ISOPHANE & REGULAR (70-30) 100 UNIT/ML ~~LOC~~ SUSP: 65.0000 [IU] | Freq: Every day | SUBCUTANEOUS | 11 refills | Status: DC

## 2016-06-04 NOTE — Progress Notes (Signed)
Subjective:    Patient ID: Bethany [name], female    DOB: 02-04-1945, 71 y.o.   MRN: [id number]  HPI Pt returns for f/u of diabetes mellitus: DM type: Insulin-requiring type 2 Dx'ed: 8466 Complications: polyneuropathy.  Therapy: insulin since 2009 GDM: never DKA: never Severe hypoglycemia: never Pancreatitis: never Other: she takes QD insulin, after poor results with multiple daily injections, but she often eats just 2 meals per day; she takes human insulin, due to cost; she changed to 70/30 qam, due to pattern of cbg's. Interval history: She seldom has hypoglycemia, and these are mild.  This happens when she misses a meal.  She says chronic back pain is compromising her ability to care for herself.  no cbg record, but states cbg's vary from 65-203.  It is in general higher as the day goes on.  She is working towards weight loss surgery.  Past Medical History:  Diagnosis Date  . Arthritis   . Diabetes mellitus without complication ([place])    Type II  . Fibromyalgia   . GERD (gastroesophageal reflux disease)    better after gall bladder surgery-   . Hyperlipidemia   . Hypertension   . Shortness of breath dyspnea    with exertion    Past Surgical History:  Procedure Laterality Date  . ABDOMINAL HYSTERECTOMY  1997  . CATARACT EXTRACTION Bilateral 2014  . CHOLECYSTECTOMY  2010  . COLONOSCOPY    . COLONOSCOPY WITH PROPOFOL N/A 04/21/2015   Procedure: COLONOSCOPY WITH PROPOFOL;  Surgeon: Hulen Luster, MD;  Location: [facility] ENDOSCOPY;  Service: Gastroenterology;  Laterality: N/A;  . EYE SURGERY Bilateral    Cataract  . LUMBAR WOUND DEBRIDEMENT N/A 10/20/2014   Procedure: LUMBAR WOUND DEBRIDEMENT;  Surgeon: Eustace Moore, MD;  Location: [place] NEURO ORS;  Service: Neurosurgery;  Laterality: N/A;  . MAXIMUM ACCESS (MAS)POSTERIOR LUMBAR INTERBODY FUSION (PLIF) 1 LEVEL N/A 09/16/2014   Procedure: L/4-5 FOR MAXIMUM ACCESS (MAS) POSTERIOR LUMBAR INTERBODY FUSION (PLIF) 1 LEVEL;  Surgeon: Eustace Moore, MD;  Location: [place] NEURO ORS;  Service: Neurosurgery;  Laterality: N/A;  L/4-5 FOR MAXIMUM ACCESS (MAS) POSTERIOR LUMBAR INTERBODY FUSION (PLIF) 1 LEVEL    Social History   Social History  . Marital status: Married    Spouse name: N/A  . Number of children: N/A  . Years of education: N/A   Occupational History  . Not on file.   Social History Main Topics  . Smoking status: Never Smoker  . Smokeless tobacco: Never Used  . Alcohol use No  . Drug use: No  . Sexual activity: Not Currently   Other Topics Concern  . Not on file   Social History Narrative  . No narrative on file    Current Outpatient Prescriptions on File Prior to Visit  Medication Sig Dispense Refill  . amLODipine (NORVASC) 10 MG tablet TAKE 1 TABLET BY MOUTH DAILY 30 tablet 3  . aspirin 81 MG tablet Take 81 mg by mouth daily. Reported on 03/29/2015    . atorvastatin (LIPITOR) 20 MG tablet TAKE 1 TABLET BY MOUTH DAILY 90 tablet 3  . Blood Glucose Monitoring Suppl (ONE TOUCH ULTRA SYSTEM KIT) w/Device KIT 2  times daily. E11.0=9 1 each 2  . docusate sodium (COLACE) 100 MG capsule Take 100 mg by mouth daily as needed for mild constipation. Reported on 03/29/2015    . furosemide (LASIX) 20 MG tablet TAKE ONE (1) TABLET BY MOUTH DAILY AS NEEDED 30 tablet 0  .  glucose blood (FREESTYLE LITE) test strip 1 each by Other route 2 (two) times daily. And lancets 2/day 100 each 12  . glucose blood (ONE TOUCH ULTRA TEST) test strip 2 times daily. Dx Code: e11.9 200 each 2  . losartan (COZAAR) 50 MG tablet TAKE 1 TABLET BY MOUTH DAILY 90 tablet 3  . naproxen sodium (ANAPROX) 220 MG tablet Take 220 mg by mouth as needed (for pain).     . Omega-3 Fatty Acids (FISH OIL PO) Take 2,400 mg by mouth daily.     Glory Rosebush DELICA LANCETS 02R MISC 2 times daily. Dx Code: e11.9 200 each 2   Current Facility-Administered Medications on File Prior to Visit  Medication Dose Route Frequency Provider Last Rate Last Dose  . betamethasone  acetate-betamethasone sodium phosphate (CELESTONE) injection 12 mg  12 mg Intramuscular Once Daylene Katayama M, DPM      . betamethasone acetate-betamethasone sodium phosphate (CELESTONE) injection 3 mg  3 mg Intramuscular Once Edrick Kins, DPM        No Known Allergies  Family History  Problem Relation Age of Onset  . Arthritis Mother   . Cancer Mother        Ovarian  . Diabetes Mother   . Heart disease Father   . Hypertension Father     BP 132/62   Pulse 90   Wt 296 lb (134.3 kg)   SpO2 98%   BMI 45.01 kg/m   Review of Systems She denies LOC.      Objective:   Physical Exam VITAL SIGNS:  See vs page.  GENERAL: no distress.  Morbid obesity.   Pulses: dorsalis pedis intact bilat.   MSK: no deformity of the feet.   CV: 2+ bilat leg edema.  Skin:  no ulcer on the feet.  normal color and temp on the feet, but there is patchy hyperpigmentation of the legs Neuro: sensation is intact to touch on the feet, but decreased from normal.   Ext: There is bilateral onychomycosis of the toenails.   A1c=8.3%    Assessment & Plan:  Insulin-requiring type 2 DM, with polyneuropathy: she needs increased rx Obesity: persistent.   Patient Instructions  check your blood sugar twice a day.  vary the time of day when you check, between before the 3 meals, and at bedtime.  also check if you have symptoms of your blood sugar being too high or too low.  please keep a record of the readings and bring it to your next appointment here (or you can bring the meter itself).  You can write it on any piece of paper.  please call us sooner if your blood sugar goes below 70, or if you have a lot of readings over 200.  Please increase the insulin to 65 units with breakfast.  On this type of insulin schedule, you should eat meals on a regular schedule.  If a meal is missed or significantly delayed, your blood sugar could go low.   Please continue to pursue the weight loss surgery.  Please come back for a  follow-up appointment in 2 months.

## 2016-06-04 NOTE — Patient Instructions (Addendum)
check your blood sugar twice a day.  vary the time of day when you check, between before the 3 meals, and at bedtime.  also check if you have symptoms of your blood sugar being too high or too low.  please keep a record of the readings and bring it to your next appointment here (or you can bring the meter itself).  You can write it on any piece of paper.  please call us sooner if your blood sugar goes below 70, or if you have a lot of readings over 200.  Please increase the insulin to 65 units with breakfast.  On this type of insulin schedule, you should eat meals on a regular schedule.  If a meal is missed or significantly delayed, your blood sugar could go low.   Please continue to pursue the weight loss surgery.  Please come back for a follow-up appointment in 2 months.

## 2016-07-18 ENCOUNTER — Telehealth: Payer: Self-pay | Admitting: Family Medicine

## 2016-07-18 NOTE — Telephone Encounter (Signed)
[facility] - Client TELEPHONE ADVICE RECORD [facility] Patient Name: Bethany DimesMARY Letizia DOB: 1945/09/18 Initial Comment Caller states she gave herself an insulin and now her leg is itching and aching on the hip part. Been a week or so since the shot and not getting any better. No bruising or visible spots Nurse Assessment Nurse: Josie SaundersGerard, RN, [name] Date/Time Lamount Cohen(Eastern Time): 07/18/2016 4:30:50 PM Confirm and document reason for call. If symptomatic, describe symptoms. ---Caller states she gave herself an insulin and now her leg is itching and aching on the hip part. Been a week or so since the shot and not getting any better. No bruising or visible spots. Does not feel warm. Does the patient have any new or worsening symptoms? ---Yes Will a triage be completed? ---Yes Related visit to physician within the last 2 weeks? ---No Does the PT have any chronic conditions? (i.e. diabetes, asthma, etc.) ---Yes List chronic conditions. ---Diabetes, high Blood pressure, Is this a behavioral health or substance abuse call? ---No Guidelines Guideline Title Affirmed Question Affirmed Notes IV Site (Skin) Symptoms Small painless lump at prior IV site Final Disposition User Call PCP within 24 Hours Josie SaundersGerard, RN, [name] Comments Called back office about patient requesting Monday appointment. States if no other site appointments, send not over to office indicating such. Caller states she could go to [place] site if appointment available. Attempted to schedule appointment. No acute appointments available Referrals REFERRED TO PCP OFFICE Disagree/Comply: Comply

## 2016-07-19 ENCOUNTER — Telehealth: Payer: Self-pay

## 2016-07-19 NOTE — Telephone Encounter (Signed)
Pt has appt with Dr Para Marchuncan on 07/22/16 at 12:00 noon.

## 2016-07-19 NOTE — Telephone Encounter (Signed)
This is actually not my patient.  Please see if we can get her in. Thanks!

## 2016-07-19 NOTE — Telephone Encounter (Signed)
Noted. Thanks.

## 2016-07-19 NOTE — Telephone Encounter (Signed)
PLEASE NOTE: All timestamps contained within this report are represented as [place] Standard Time. CONFIDENTIALTY NOTICE: This fax transmission is intended only for the addressee. It contains information that is legally privileged, confidential or otherwise protected from use or disclosure. If you are not the intended recipient, you are strictly prohibited from reviewing, disclosing, copying using or disseminating any of this information or taking any action in reliance on or regarding this information. If you have received this fax in error, please notify us immediately by telephone so that we can arrange for its return to us. Phone: [phone], Toll-Free: [phone], Fax: [phone] Page: 1 of 2 Call Id: [id number] [facility] - Client TELEPHONE ADVICE RECORD [facility] Patient Name: Bethany DimesMARY [name] Gender: Female DOB: October 15, 1945 Age: [age] Y 4 M 18 D Return Phone Number: [phone] (Primary), [phone] (Secondary) [place]Zip: [place] [id number] Client [facility] - [facility] Day Physician Nicki ReaperBaity, Regina - NP Who Is Calling Patient / Member / Family / Caregiver Call Type Triage / Clinical Relationship To Patient Self Return Phone Number [phone] (Secondary) Chief Complaint Leg Pain Reason for Call Symptomatic / Request for Health Information Initial Comment Caller states she gave herself an insulin and now her leg is itching and aching on the hip part. Been a week or so since the shot and not getting any better. No bruising or visible spots Appointment Disposition EMR Appointment Attempted - Not Scheduled Info pasted into Epic Yes Nurse Assessment Nurse: Josie SaundersGerard, RN, [name] Date/Time Lamount Cohen(Eastern Time): 07/18/2016 4:30:50 PM Confirm and document reason for call. If symptomatic, describe symptoms. ---Caller states she gave herself an insulin and now her leg  is itching and aching on the hip part. Been a week or so since the shot and not getting any better. No bruising or visible spots. Does not feel warm. Does the PT have any chronic conditions? (i.e. diabetes, asthma, etc.) ---Yes List chronic conditions. ---Diabetes, high Blood pressure, Guidelines Guideline Title Affirmed Question IV Site (Skin) Symptoms Small painless lump at prior IV site Disp. Time Lamount Cohen(Eastern Time) Disposition Final User 07/18/2016 4:53:29 PM Call PCP within 24 Hours Yes Josie SaundersGerard, RN, [name] Referrals REFERRED TO PCP OFFICE Care Advice Given Per Guideline CALL PCP WITHIN 24 HOURS: You need to discuss this with your doctor within the next 24 hours. APPLY WARM PACK: * Apply a warm pack for 20 minutes. Repeat in 2 hours, then prn. (Reason: reduce the pain). * You can make a warm pack by putting a pleasantly warm-hot wet washcloth in a ziplock bag. PAIN MEDICINES: * For pain relief, take acetaminophen, ibuprofen, or naproxen. CALL BACK IF: * Fever occurs * You become worse. * Redness, or swelling occurs * Fever occurs * You become worse. CALL BACK IF: PLEASE NOTE: All timestamps contained within this report are represented as [place] Standard Time. CONFIDENTIALTY NOTICE: This fax transmission is intended only for the addressee. It contains information that is legally privileged, confidential or otherwise protected from use or disclosure. If you are not the intended recipient, you are strictly prohibited from reviewing, disclosing, copying using or disseminating any of this information or taking any action in reliance on or regarding this information. If you have received this fax in error, please notify us immediately by telephone so that we can arrange for its return to us. Phone: [phone], Toll-Free: [phone], Fax: [phone] Page: 2 of 2 Call Id: [id number] Comments User: Harrington ChallengerJane, [name], RN  Date/Time Lamount Cohen Time): 07/18/2016 4:54:36 PM Called back office about patient  requesting Monday appointment. States if no other site appointments, send not over to office indicating such. User: Harrington Challenger, RN Date/Time Lamount Cohen Time): 07/18/2016 4:59:22 PM Caller states she could go to [place] site if appointment available. Attempted to schedule appointment. No acute appointments available

## 2016-07-22 ENCOUNTER — Ambulatory Visit (INDEPENDENT_AMBULATORY_CARE_PROVIDER_SITE_OTHER): Payer: Medicare Other | Admitting: Family Medicine

## 2016-07-22 ENCOUNTER — Encounter: Payer: Self-pay | Admitting: Family Medicine

## 2016-07-22 DIAGNOSIS — M79606 Pain in leg, unspecified: Secondary | ICD-10-CM | POA: Insufficient documentation

## 2016-07-22 DIAGNOSIS — Z981 Arthrodesis status: Secondary | ICD-10-CM

## 2016-07-22 DIAGNOSIS — M79605 Pain in left leg: Secondary | ICD-10-CM | POA: Diagnosis not present

## 2016-07-22 DIAGNOSIS — L989 Disorder of the skin and subcutaneous tissue, unspecified: Secondary | ICD-10-CM | POA: Diagnosis not present

## 2016-07-22 MED ORDER — TRIAMCINOLONE ACETONIDE 0.1 % EX CREA: 1.0000 "application " | TOPICAL_CREAM | Freq: Two times a day (BID) | CUTANEOUS | 0 refills | Status: DC

## 2016-07-22 NOTE — Assessment & Plan Note (Signed)
It looks like she has some postinflammatory hyperpigmentation changes on the face. She still has some itching. Change to triamcinolone to control itching and that should allow the lesion to gradually fade in and resolve. Routine topical steroid cautions given. Update us as needed.

## 2016-07-22 NOTE — Progress Notes (Signed)
She used her insulin to give a shot in the L leg, laterally on the thigh.  Felt pain at the time.  In the meantime, local irritation, itching, burning sensation, sunlight sensitivity, even though her pants leg.  Local pea sized tender area.   She usually rotates her insulin shot in the abdomen or legs.  No repeat injection in the legs recently.  She hasn't had similar rxn o/w.   No fevers.  Doesn't feel sick o/w.  Her leg is slowly getting better.    Also with rash on the L side of face, itchy, noted about 3-4 weeks.  Hydrocortisone helps some with itching but doesn't help it fade.  No clear trigger for the episode.    [place] going to the [place] in about 1 [place] and wanted to get checked prior to leaving.   She is having recurrent right-sided lower back pain. She previously had surgery, about 2 years ago. She was asking about options at this point, either medical treatment or if she should follow-up with the spine clinic. I told her I wanted to talk to her PCP.  I did not change her medications at the office visit.  Meds, vitals, and allergies reviewed.   ROS: Per HPI unless specifically indicated in ROS section   nad ncat Hyperpigmentation on the L side of the face without ulceration or fluctuant mass. rrr ctab L thigh ttp laterally, no fluctuance, no rash, no bruising.

## 2016-07-22 NOTE — Assessment & Plan Note (Signed)
I will talk to her PCP first. Discussed with patient.

## 2016-07-22 NOTE — Patient Instructions (Addendum)
Use TAC on the face rash for itching.  Update us if not better.  The leg troubles should get better, likely a local reaction.  Take care.  Glad to see you.

## 2016-07-22 NOTE — Assessment & Plan Note (Signed)
It looks like she had a local reaction from insulin injection. It likely was from the injection itself not from the insulin. Discussed with patient. She is rotating sites. She never had a reaction like this before. If she has recurrent issues to let us know but I expect this to be an isolated event.

## 2016-07-23 ENCOUNTER — Telehealth: Payer: Self-pay | Admitting: Family Medicine

## 2016-07-23 NOTE — Telephone Encounter (Signed)
Notify pt.  [facility] that [facility] f/u with the [facility] clinic.  Upon consideration, I agree.  Thanks.

## 2016-07-23 NOTE — Telephone Encounter (Signed)
Patient advised.

## 2016-07-31 ENCOUNTER — Other Ambulatory Visit: Payer: Self-pay | Admitting: Internal Medicine

## 2016-08-05 ENCOUNTER — Ambulatory Visit (INDEPENDENT_AMBULATORY_CARE_PROVIDER_SITE_OTHER): Payer: Medicare Other | Admitting: Endocrinology

## 2016-08-05 ENCOUNTER — Encounter: Payer: Self-pay | Admitting: Endocrinology

## 2016-08-05 VITALS — BP 135/75 | HR 94 | Wt 296.2 lb

## 2016-08-05 DIAGNOSIS — E118 Type 2 diabetes mellitus with unspecified complications: Secondary | ICD-10-CM | POA: Diagnosis not present

## 2016-08-05 DIAGNOSIS — IMO0002 Reserved for concepts with insufficient information to code with codable children: Secondary | ICD-10-CM

## 2016-08-05 DIAGNOSIS — E1165 Type 2 diabetes mellitus with hyperglycemia: Secondary | ICD-10-CM

## 2016-08-05 DIAGNOSIS — R200 Anesthesia of skin: Secondary | ICD-10-CM | POA: Diagnosis not present

## 2016-08-05 DIAGNOSIS — Z794 Long term (current) use of insulin: Secondary | ICD-10-CM

## 2016-08-05 MED ORDER — CLOTRIMAZOLE-BETAMETHASONE 1-0.05 % EX CREA: 1.0000 "application " | TOPICAL_CREAM | Freq: Three times a day (TID) | CUTANEOUS | 11 refills | Status: DC

## 2016-08-05 NOTE — Progress Notes (Signed)
Subjective:    Patient ID: Bethany [name], female    DOB: 01/27/1945, 71 y.o.   MRN: [id number]  HPI Pt returns for f/u of diabetes mellitus: DM type: Insulin-requiring type 2 Dx'ed: 2229 Complications: polyneuropathy.  Therapy: insulin since 2009 GDM: never DKA: never Severe hypoglycemia: never Pancreatitis: never Other: she takes QD insulin, after poor results with multiple daily injections, but she often eats just 2 meals per day; she takes human insulin, due to cost; she changed to 70/30 qam, due to pattern of cbg's. Interval history: She has hypoglycemia approx twice per week, and these are mild.  This happens when she misses a meal.  She says chronic back pain is compromising her ability to care for herself.  no cbg record, but states cbg's vary from 76-230.  It is in general higher as the day goes on.  She is still working towards weight loss surgery.  Past Medical History:  Diagnosis Date  . Arthritis   . Diabetes mellitus without complication ([place])    Type II  . Fibromyalgia   . GERD (gastroesophageal reflux disease)    better after gall bladder surgery-   . Hyperlipidemia   . Hypertension   . Shortness of breath dyspnea    with exertion    Past Surgical History:  Procedure Laterality Date  . ABDOMINAL HYSTERECTOMY  1997  . CATARACT EXTRACTION Bilateral 2014  . CHOLECYSTECTOMY  2010  . COLONOSCOPY    . COLONOSCOPY WITH PROPOFOL N/A 04/21/2015   Procedure: COLONOSCOPY WITH PROPOFOL;  Surgeon: Hulen Luster, MD;  Location: [facility] END[facility]COPY;  Service: Gastroenterology;  Laterality: N/A;  . EYE SURGERY Bilateral    Cataract  . LUMBAR WOUND DEBRIDEMENT N/A 10/20/2014   Procedure: LUMBAR WOUND DEBRIDEMENT;  Surgeon: Eustace Moore, MD;  Location: [place] NEURO ORS;  Service: Neurosurgery;  Laterality: N/A;  . MAXIMUM ACCESS (MAS)POSTERIOR LUMBAR INTERBODY FUSION (PLIF) 1 LEVEL N/A 09/16/2014   Procedure: L/4-5 FOR MAXIMUM ACCESS (MAS) POSTERIOR LUMBAR INTERBODY FUSION (PLIF) 1 LEVEL;   Surgeon: Eustace Moore, MD;  Location: [place] NEURO ORS;  Service: Neurosurgery;  Laterality: N/A;  L/4-5 FOR MAXIMUM ACCESS (MAS) POSTERIOR LUMBAR INTERBODY FUSION (PLIF) 1 LEVEL    Social History   Social History  . Marital status: Married    Spouse name: N/A  . Number of children: N/A  . Years of education: N/A   Occupational History  . Not on file.   Social History Main Topics  . Smoking status: Never Smoker  . Smokeless tobacco: Never Used  . Alcohol use No  . Drug use: No  . Sexual activity: Not Currently   Other Topics Concern  . Not on file   Social History Narrative  . No narrative on file    Current Outpatient Prescriptions on File Prior to Visit  Medication Sig Dispense Refill  . amLODipine (NORVASC) 10 MG tablet TAKE 1 TABLET BY MOUTH DAILY 30 tablet 0  . aspirin 81 MG tablet Take 81 mg by mouth daily. Reported on 03/29/2015    . atorvastatin (LIPITOR) 20 MG tablet TAKE 1 TABLET BY MOUTH DAILY 90 tablet 3  . Blood Glucose Monitoring Suppl (ONE TOUCH ULTRA SYSTEM KIT) w/Device KIT 2  times daily. E11.0=9 1 each 2  . docusate sodium (COLACE) 100 MG capsule Take 100 mg by mouth daily as needed for mild constipation. Reported on 03/29/2015    . furosemide (LASIX) 20 MG tablet TAKE ONE (1) TABLET BY MOUTH DAILY AS NEEDED 30  tablet 0  . glucose blood (FREESTYLE LITE) test strip 1 each by Other route 2 (two) times daily. And lancets 2/day 100 each 12  . glucose blood (ONE TOUCH ULTRA TEST) test strip 2 times daily. Dx Code: e11.9 200 each 2  . insulin NPH-regular Human (NOVOLIN 70/30) (70-30) 100 UNIT/ML injection Inject 65 Units into the skin daily with breakfast. And syringes 1/day 20 mL 11  . losartan (COZAAR) 50 MG tablet TAKE 1 TABLET BY MOUTH DAILY 90 tablet 3  . naproxen sodium (ANAPROX) 220 MG tablet Take 220 mg by mouth as needed (for pain).     . Omega-3 Fatty Acids (FISH OIL PO) Take 2,400 mg by mouth daily.     Glory Rosebush DELICA LANCETS 03K MISC 2 times daily. Dx  Code: e11.9 200 each 2  . triamcinolone cream (KENALOG) 0.1 % Apply 1 application topically 2 (two) times daily. 30 g 0   Current Facility-Administered Medications on File Prior to Visit  Medication Dose Route Frequency Provider Last Rate Last Dose  . betamethasone acetate-betamethasone sodium phosphate (CELESTONE) injection 3 mg  3 mg Intramuscular Once Edrick Kins, DPM        No Known Allergies  Family History  Problem Relation Age of Onset  . Arthritis Mother   . Cancer Mother        Ovarian  . Diabetes Mother   . Heart disease Father   . Hypertension Father     BP 135/75   Pulse 94   Wt 296 lb 3.2 oz (134.4 kg)   SpO2 96%   BMI 45.04 kg/m    Review of Systems She has itching of the feet.  She denies LOC.     Objective:   Physical Exam VITAL SIGNS:  See vs page.   GENERAL: no distress.  Morbid obesity.   Pulses: foot pulses are intact bilaterally.   MSK: no deformity of the feet or ankles.  CV: 2+ bilat edema of the legs Skin:  no ulcer on the feet or ankles.  normal temp on the feet and ankles.  No rash on the feet, but there is patchy hyperpigmentation of the legs. ,  Neuro: sensation is intact to touch on the feet and ankles.   Ext: There is bilateral onychomycosis of the toenails  Lab Results  Component Value Date   HGBA1C 8.3 06/04/2016      Assessment & Plan:  Insulin-requiring type 2 DM, with polyneuropathy: uncertain control.  Foot itching, new. Obesity: persistent  Patient Instructions  check your blood sugar twice a day.  vary the time of day when you check, between before the 3 meals, and at bedtime.  also check if you have symptoms of your blood sugar being too high or too low.  please keep a record of the readings and bring it to your next appointment here (or you can bring the meter itself).  You can write it on any piece of paper.  please call us sooner if your blood sugar goes below 70, or if you have a lot of readings over 200.  blood tests  are requested for you today.  We'll let you know about the results. On this type of insulin schedule, you should eat meals on a regular schedule.  If a meal is missed or significantly delayed, your blood sugar could go low.   Please continue to pursue the weight loss surgery.  I have sent a prescription to your pharmacy, for the foot itching.  Please come back for a follow-up appointment in 2 months.

## 2016-08-05 NOTE — Patient Instructions (Addendum)
check your blood sugar twice a day.  vary the time of day when you check, between before the 3 meals, and at bedtime.  also check if you have symptoms of your blood sugar being too high or too low.  please keep a record of the readings and bring it to your next appointment here (or you can bring the meter itself).  You can write it on any piece of paper.  please call us sooner if your blood sugar goes below 70, or if you have a lot of readings over 200.  blood tests are requested for you today.  We'll let you know about the results. On this type of insulin schedule, you should eat meals on a regular schedule.  If a meal is missed or significantly delayed, your blood sugar could go low.   Please continue to pursue the weight loss surgery.  I have sent a prescription to your pharmacy, for the foot itching.   Please come back for a follow-up appointment in 2 months.

## 2016-09-06 ENCOUNTER — Other Ambulatory Visit: Payer: Self-pay | Admitting: Internal Medicine

## 2016-10-08 ENCOUNTER — Ambulatory Visit: Payer: Medicare Other | Admitting: Endocrinology

## 2016-10-13 ENCOUNTER — Other Ambulatory Visit: Payer: Self-pay | Admitting: Internal Medicine

## 2016-10-17 ENCOUNTER — Other Ambulatory Visit: Payer: Medicare Other

## 2016-10-28 ENCOUNTER — Encounter: Payer: Self-pay | Admitting: Endocrinology

## 2016-10-28 ENCOUNTER — Ambulatory Visit (INDEPENDENT_AMBULATORY_CARE_PROVIDER_SITE_OTHER): Payer: Medicare Other | Admitting: Endocrinology

## 2016-10-28 VITALS — BP 122/68 | HR 79 | Wt 292.4 lb

## 2016-10-28 DIAGNOSIS — E1165 Type 2 diabetes mellitus with hyperglycemia: Secondary | ICD-10-CM | POA: Diagnosis not present

## 2016-10-28 DIAGNOSIS — E118 Type 2 diabetes mellitus with unspecified complications: Secondary | ICD-10-CM | POA: Diagnosis not present

## 2016-10-28 DIAGNOSIS — IMO0002 Reserved for concepts with insufficient information to code with codable children: Secondary | ICD-10-CM

## 2016-10-28 LAB — POCT GLYCOSYLATED HEMOGLOBIN (HGB A1C): Hemoglobin A1C: 9.4

## 2016-10-28 NOTE — Patient Instructions (Addendum)
check your blood sugar twice a day.  vary the time of day when you check, between before the 3 meals, and at bedtime.  also check if you have symptoms of your blood sugar being too high or too low.  please keep a record of the readings and bring it to your next appointment here (or you can bring the meter itself).  You can write it on any piece of paper.  please call us sooner if your blood sugar goes below 70, or if you have a lot of readings over 200.  A different type of diabetes blood test is requested for you today.  We'll let you know about the results. On this type of insulin schedule, you should eat meals on a regular schedule.  If a meal is missed or significantly delayed, your blood sugar could go low.   Please come back for a follow-up appointment in 3 months.

## 2016-10-28 NOTE — Progress Notes (Signed)
Subjective:    Patient ID: Bethany [name], female    DOB: 03-26-1945, 71 y.o.   MRN: [id number]  HPI Pt returns for f/u of diabetes mellitus: DM type: Insulin-requiring type 2 Dx'ed: 9480 Complications: polyneuropathy.  Therapy: insulin since 2009 GDM: never DKA: never Severe hypoglycemia: never Pancreatitis: never Other: she takes QD insulin, after poor results with multiple daily injections, but she often eats just 2 meals per day; she takes human insulin, due to cost; she changed to 70/30 qam, due to pattern of cbg's. Interval history: She missed her insulin for 9 days, approx 1 month.  no cbg record, but states back on the insulin, cbg varies from 76-300.  It is lowest in the afternoon, especially if lunch is missed or delayed.   Past Medical History:  Diagnosis Date  . Arthritis   . Diabetes mellitus without complication ([place])    Type II  . Fibromyalgia   . GERD (gastroesophageal reflux disease)    better after gall bladder surgery-   . Hyperlipidemia   . Hypertension   . Shortness of breath dyspnea    with exertion    Past Surgical History:  Procedure Laterality Date  . ABDOMINAL HYSTERECTOMY  1997  . CATARACT EXTRACTION Bilateral 2014  . CHOLECYSTECTOMY  2010  . COLONOSCOPY    . COLONOSCOPY WITH PROPOFOL N/A 04/21/2015   Procedure: COLONOSCOPY WITH PROPOFOL;  Surgeon: Hulen Luster, MD;  Location: [facility] ENDOSCOPY;  Service: Gastroenterology;  Laterality: N/A;  . EYE SURGERY Bilateral    Cataract  . LUMBAR WOUND DEBRIDEMENT N/A 10/20/2014   Procedure: LUMBAR WOUND DEBRIDEMENT;  Surgeon: Eustace Moore, MD;  Location: [place] NEURO ORS;  Service: Neurosurgery;  Laterality: N/A;  . MAXIMUM ACCESS (MAS)POSTERIOR LUMBAR INTERBODY FUSION (PLIF) 1 LEVEL N/A 09/16/2014   Procedure: L/4-5 FOR MAXIMUM ACCESS (MAS) POSTERIOR LUMBAR INTERBODY FUSION (PLIF) 1 LEVEL;  Surgeon: Eustace Moore, MD;  Location: [place] NEURO ORS;  Service: Neurosurgery;  Laterality: N/A;  L/4-5 FOR MAXIMUM ACCESS (MAS)  POSTERIOR LUMBAR INTERBODY FUSION (PLIF) 1 LEVEL    Social History   Social History  . Marital status: Married    Spouse name: N/A  . Number of children: N/A  . Years of education: N/A   Occupational History  . Not on file.   Social History Main Topics  . Smoking status: Never Smoker  . Smokeless tobacco: Never Used  . Alcohol use No  . Drug use: No  . Sexual activity: Not Currently   Other Topics Concern  . Not on file   Social History Narrative  . No narrative on file    Current Outpatient Prescriptions on File Prior to Visit  Medication Sig Dispense Refill  . amLODipine (NORVASC) 10 MG tablet Take 1 tablet (10 mg total) by mouth daily. MUST SCHEDULE ANNUAL PHYSICAL EXAM 30 tablet 1  . aspirin 81 MG tablet Take 81 mg by mouth daily. Reported on 03/29/2015    . atorvastatin (LIPITOR) 20 MG tablet TAKE 1 TABLET BY MOUTH DAILY 90 tablet 3  . Blood Glucose Monitoring Suppl (ONE TOUCH ULTRA SYSTEM KIT) w/Device KIT 2  times daily. E11.0=9 1 each 2  . clotrimazole-betamethasone (LOTRISONE) cream Apply 1 application topically 3 (three) times daily. As needed for itching. 45 g 11  . docusate sodium (COLACE) 100 MG capsule Take 100 mg by mouth daily as needed for mild constipation. Reported on 03/29/2015    . furosemide (LASIX) 20 MG tablet TAKE ONE (1) TABLET BY MOUTH  DAILY AS NEEDED 30 tablet 0  . glucose blood (FREESTYLE LITE) test strip 1 each by Other route 2 (two) times daily. And lancets 2/day 100 each 12  . glucose blood (ONE TOUCH ULTRA TEST) test strip 2 times daily. Dx Code: e11.9 200 each 2  . insulin NPH-regular Human (NOVOLIN 70/30) (70-30) 100 UNIT/ML injection Inject 65 Units into the skin daily with breakfast. And syringes 1/day 20 mL 11  . losartan (COZAAR) 50 MG tablet Take 1 tablet (50 mg total) by mouth daily. MUST SCHEDULE ANNUAL EXAM 90 tablet 0  . naproxen sodium (ANAPROX) 220 MG tablet Take 220 mg by mouth as needed (for pain).     . Omega-3 Fatty Acids (FISH  OIL PO) Take 2,400 mg by mouth daily.     Glory Rosebush DELICA LANCETS 66Q MISC 2 times daily. Dx Code: e11.9 200 each 2  . triamcinolone cream (KENALOG) 0.1 % Apply 1 application topically 2 (two) times daily. 30 g 0   Current Facility-Administered Medications on File Prior to Visit  Medication Dose Route Frequency Provider Last Rate Last Dose  . betamethasone acetate-betamethasone sodium phosphate (CELESTONE) injection 3 mg  3 mg Intramuscular Once Edrick Kins, DPM        No Known Allergies  Family History  Problem Relation Age of Onset  . Arthritis Mother   . Cancer Mother        Ovarian  . Diabetes Mother   . Heart disease Father   . Hypertension Father     BP 122/68   Pulse 79   Wt 292 lb 6.4 oz (132.6 kg)   SpO2 97%   BMI 44.46 kg/m   Review of Systems She has itching of the feet.  She denies LOC.     Objective:   Physical Exam VITAL SIGNS:  See vs page.   GENERAL: no distress.  Morbid obesity.   Pulses: foot pulses are intact bilaterally.   MSK: no deformity of the feet or ankles.  CV: 1+ bilat edema of the legs Skin:  no ulcer on the feet or ankles.  normal temp on the feet and ankles.   Neuro: sensation is intact to touch on the feet and ankles.   Ext: There is bilateral onychomycosis of the toenails  Lab Results  Component Value Date   HGBA1C 9.4 10/28/2016      Assessment & Plan:  Insulin-requiring type 2 DM, with polyneuropathy: uncertain control.  Foot itching, new: I rx'ed lotrisone.  Obesity: persistent  Patient Instructions  check your blood sugar twice a day.  vary the time of day when you check, between before the 3 meals, and at bedtime.  also check if you have symptoms of your blood sugar being too high or too low.  please keep a record of the readings and bring it to your next appointment here (or you can bring the meter itself).  You can write it on any piece of paper.  please call us sooner if your blood sugar goes below 70, or if you have a  lot of readings over 200.  A different type of diabetes blood test is requested for you today.  We'll let you know about the results. On this type of insulin schedule, you should eat meals on a regular schedule.  If a meal is missed or significantly delayed, your blood sugar could go low.   Please come back for a follow-up appointment in 3 months.

## 2016-10-30 ENCOUNTER — Telehealth: Payer: Self-pay | Admitting: Endocrinology

## 2016-10-30 LAB — FRUCTOSAMINE: Fructosamine: 446 umol/L — ABNORMAL HIGH (ref 190–270)

## 2016-10-30 NOTE — Telephone Encounter (Signed)
please call patient: This is like an a1c of 9.5%.  Please increase the insulin to 80 units with breakfast.  I'll see you next time.

## 2016-10-31 NOTE — Telephone Encounter (Signed)
Patient notified

## 2017-01-28 ENCOUNTER — Ambulatory Visit: Payer: Medicare Other | Admitting: Endocrinology

## 2017-01-30 ENCOUNTER — Encounter: Payer: Self-pay | Admitting: Endocrinology

## 2017-01-30 ENCOUNTER — Ambulatory Visit: Payer: Medicare HMO | Admitting: Endocrinology

## 2017-01-30 VITALS — BP 138/82 | HR 78 | Resp 20 | Wt 290.0 lb

## 2017-01-30 DIAGNOSIS — E1165 Type 2 diabetes mellitus with hyperglycemia: Secondary | ICD-10-CM

## 2017-01-30 DIAGNOSIS — E118 Type 2 diabetes mellitus with unspecified complications: Secondary | ICD-10-CM | POA: Diagnosis not present

## 2017-01-30 DIAGNOSIS — IMO0002 Reserved for concepts with insufficient information to code with codable children: Secondary | ICD-10-CM

## 2017-01-30 LAB — POCT GLYCOSYLATED HEMOGLOBIN (HGB A1C): Hemoglobin A1C: 9.3

## 2017-01-30 MED ORDER — INSULIN REGULAR HUMAN 100 UNIT/ML IJ SOLN: 35.0000 [IU] | Freq: Every day | INTRAMUSCULAR | 11 refills | Status: DC

## 2017-01-30 MED ORDER — INSULIN NPH (HUMAN) (ISOPHANE) 100 UNIT/ML ~~LOC~~ SUSP: 35.0000 [IU] | Freq: Every day | SUBCUTANEOUS | 11 refills | Status: DC

## 2017-01-30 NOTE — Patient Instructions (Addendum)
check your blood sugar twice a day.  vary the time of day when you check, between before the 3 meals, and at bedtime.  also check if you have symptoms of your blood sugar being too high or too low.  please keep a record of the readings and bring it to your next appointment here (or you can bring the meter itself).  You can write it on any piece of paper.  please call us sooner if your blood sugar goes below 70, or if you have a lot of readings over 200.  Please change your insulin to R and N, 35 units of each, with breakfast.  On this type of insulin schedule, you should eat meals on a regular schedule.  If a meal is missed or significantly delayed, your blood sugar could go low.   Please come back for a follow-up appointment in 2 months.

## 2017-01-30 NOTE — Progress Notes (Signed)
Subjective:    Patient ID: Bethany [name], female    DOB: 12-18-1945, 72 y.o.   MRN: [id number]  HPI Pt returns for f/u of diabetes mellitus: DM type: Insulin-requiring type 2 Dx'ed: 4562 Complications: polyneuropathy.  Therapy: insulin since 2009 GDM: never DKA: never Severe hypoglycemia: never Pancreatitis: never Other: she takes QD insulin, after poor results with multiple daily injections, but she often eats just 2 meals per day; she takes human insulin, due to cost; she changed to 70/30 qam, due to pattern of cbg's. Interval history: She says she never misses her insulin.  no cbg record, but states cbg varies from 70-300.  It is lowest in the early hrs of the morning.   Past Medical History:  Diagnosis Date  . Arthritis   . Diabetes mellitus without complication ([place])    Type II  . Fibromyalgia   . GERD (gastroesophageal reflux disease)    better after gall bladder surgery-   . Hyperlipidemia   . Hypertension   . Shortness of breath dyspnea    with exertion    Past Surgical History:  Procedure Laterality Date  . ABDOMINAL HYSTERECTOMY  1997  . CATARACT EXTRACTION Bilateral 2014  . CHOLECYSTECTOMY  2010  . COLONOSCOPY    . COLONOSCOPY WITH PROPOFOL N/A 04/21/2015   Procedure: COLONOSCOPY WITH PROPOFOL;  Surgeon: Hulen Luster, MD;  Location: [facility] ENDOSCOPY;  Service: Gastroenterology;  Laterality: N/A;  . EYE SURGERY Bilateral    Cataract  . LUMBAR WOUND DEBRIDEMENT N/A 10/20/2014   Procedure: LUMBAR WOUND DEBRIDEMENT;  Surgeon: Eustace Moore, MD;  Location: [place] NEURO ORS;  Service: Neurosurgery;  Laterality: N/A;  . MAXIMUM ACCESS (MAS)POSTERIOR LUMBAR INTERBODY FUSION (PLIF) 1 LEVEL N/A 09/16/2014   Procedure: L/4-5 FOR MAXIMUM ACCESS (MAS) POSTERIOR LUMBAR INTERBODY FUSION (PLIF) 1 LEVEL;  Surgeon: Eustace Moore, MD;  Location: [place] NEURO ORS;  Service: Neurosurgery;  Laterality: N/A;  L/4-5 FOR MAXIMUM ACCESS (MAS) POSTERIOR LUMBAR INTERBODY FUSION (PLIF) 1 LEVEL    Social  History   Socioeconomic History  . Marital status: Married    Spouse name: Not on file  . Number of children: Not on file  . Years of education: Not on file  . Highest education level: Not on file  Social Needs  . Financial resource strain: Not on file  . Food insecurity - worry: Not on file  . Food insecurity - inability: Not on file  . Transportation needs - medical: Not on file  . Transportation needs - non-medical: Not on file  Occupational History  . Not on file  Tobacco Use  . Smoking status: Never Smoker  . Smokeless tobacco: Never Used  Substance and Sexual Activity  . Alcohol use: No  . Drug use: No  . Sexual activity: Not Currently  Other Topics Concern  . Not on file  Social History Narrative  . Not on file    Current Outpatient Medications on File Prior to Visit  Medication Sig Dispense Refill  . amLODipine (NORVASC) 10 MG tablet Take 1 tablet (10 mg total) by mouth daily. MUST SCHEDULE ANNUAL PHYSICAL EXAM 30 tablet 1  . aspirin 81 MG tablet Take 81 mg by mouth daily. Reported on 03/29/2015    . atorvastatin (LIPITOR) 20 MG tablet TAKE 1 TABLET BY MOUTH DAILY 90 tablet 3  . Blood Glucose Monitoring Suppl (ONE TOUCH ULTRA SYSTEM KIT) w/Device KIT 2  times daily. E11.0=9 1 each 2  . clotrimazole-betamethasone (LOTRISONE) cream Apply 1 application  topically 3 (three) times daily. As needed for itching. 45 g 11  . docusate sodium (COLACE) 100 MG capsule Take 100 mg by mouth daily as needed for mild constipation. Reported on 03/29/2015    . furosemide (LASIX) 20 MG tablet TAKE ONE (1) TABLET BY MOUTH DAILY AS NEEDED 30 tablet 0  . glucose blood (FREESTYLE LITE) test strip 1 each by Other route 2 (two) times daily. And lancets 2/day 100 each 12  . glucose blood (ONE TOUCH ULTRA TEST) test strip 2 times daily. Dx Code: e11.9 200 each 2  . losartan (COZAAR) 50 MG tablet Take 1 tablet (50 mg total) by mouth daily. MUST SCHEDULE ANNUAL EXAM 90 tablet 0  . naproxen sodium  (ANAPROX) 220 MG tablet Take 220 mg by mouth as needed (for pain).     . Omega-3 Fatty Acids (FISH OIL PO) Take 2,400 mg by mouth daily.     Glory Rosebush DELICA LANCETS 46K MISC 2 times daily. Dx Code: e11.9 200 each 2  . triamcinolone cream (KENALOG) 0.1 % Apply 1 application topically 2 (two) times daily. 30 g 0   Current Facility-Administered Medications on File Prior to Visit  Medication Dose Route Frequency Provider Last Rate Last Dose  . betamethasone acetate-betamethasone sodium phosphate (CELESTONE) injection 3 mg  3 mg Intramuscular Once Edrick Kins, DPM        No Known Allergies  Family History  Problem Relation Age of Onset  . Arthritis Mother   . Cancer Mother        Ovarian  . Diabetes Mother   . Heart disease Father   . Hypertension Father     BP 138/82 (BP Location: Left Arm, Patient Position: Sitting, Cuff Size: Large)   Pulse 78   Resp 20   Wt 290 lb (131.5 kg)   SpO2 99%   BMI 44.09 kg/m     Review of Systems Denies LOC.     Objective:   Physical Exam VITAL SIGNS:  See vs page.   GENERAL: no distress.  Morbid obesity.   Pulses: foot pulses are intact bilaterally.   MSK: no deformity of the feet or ankles.  CV: 1+ bilat edema of the legs Skin:  no ulcer on the feet or ankles.  normal temp on the feet and ankles.   Neuro: sensation is intact to touch on the feet and ankles.   Ext: There is bilateral onychomycosis of the toenails  Lab Results  Component Value Date   HGBA1C 9.3 01/30/2017       Assessment & Plan:  Insulin-requiring type 2 DM, with polyneuropathy: Based on the pattern of her cbg's, she needs some adjustment in her therapy   Patient Instructions  check your blood sugar twice a day.  vary the time of day when you check, between before the 3 meals, and at bedtime.  also check if you have symptoms of your blood sugar being too high or too low.  please keep a record of the readings and bring it to your next appointment here (or you  can bring the meter itself).  You can write it on any piece of paper.  please call us sooner if your blood sugar goes below 70, or if you have a lot of readings over 200.  Please change your insulin to R and N, 35 units of each, with breakfast.  On this type of insulin schedule, you should eat meals on a regular schedule.  If a meal is missed  or significantly delayed, your blood sugar could go low.   Please come back for a follow-up appointment in 2 months.

## 2017-03-05 ENCOUNTER — Encounter: Payer: Medicare Other | Admitting: Internal Medicine

## 2017-03-20 ENCOUNTER — Ambulatory Visit (INDEPENDENT_AMBULATORY_CARE_PROVIDER_SITE_OTHER): Payer: Medicare HMO | Admitting: Internal Medicine

## 2017-03-20 ENCOUNTER — Other Ambulatory Visit: Payer: Self-pay | Admitting: Internal Medicine

## 2017-03-20 ENCOUNTER — Encounter: Payer: Self-pay | Admitting: Internal Medicine

## 2017-03-20 VITALS — BP 132/84 | HR 80 | Temp 98.1°F | Ht 67.0 in | Wt 284.0 lb

## 2017-03-20 DIAGNOSIS — M797 Fibromyalgia: Secondary | ICD-10-CM | POA: Diagnosis not present

## 2017-03-20 DIAGNOSIS — K219 Gastro-esophageal reflux disease without esophagitis: Secondary | ICD-10-CM

## 2017-03-20 DIAGNOSIS — E7800 Pure hypercholesterolemia, unspecified: Secondary | ICD-10-CM | POA: Diagnosis not present

## 2017-03-20 DIAGNOSIS — E559 Vitamin D deficiency, unspecified: Secondary | ICD-10-CM

## 2017-03-20 DIAGNOSIS — I10 Essential (primary) hypertension: Secondary | ICD-10-CM | POA: Diagnosis not present

## 2017-03-20 DIAGNOSIS — Z1159 Encounter for screening for other viral diseases: Secondary | ICD-10-CM | POA: Diagnosis not present

## 2017-03-20 DIAGNOSIS — Z794 Long term (current) use of insulin: Secondary | ICD-10-CM

## 2017-03-20 DIAGNOSIS — M1990 Unspecified osteoarthritis, unspecified site: Secondary | ICD-10-CM | POA: Diagnosis not present

## 2017-03-20 DIAGNOSIS — Z0000 Encounter for general adult medical examination without abnormal findings: Secondary | ICD-10-CM

## 2017-03-20 DIAGNOSIS — Z1239 Encounter for other screening for malignant neoplasm of breast: Secondary | ICD-10-CM

## 2017-03-20 DIAGNOSIS — E1141 Type 2 diabetes mellitus with diabetic mononeuropathy: Secondary | ICD-10-CM

## 2017-03-20 LAB — COMPREHENSIVE METABOLIC PANEL WITH GFR
ALT: 11 U/L (ref 0–35)
AST: 14 U/L (ref 0–37)
Albumin: 4 g/dL (ref 3.5–5.2)
Alkaline Phosphatase: 153 U/L — ABNORMAL HIGH (ref 39–117)
BUN: 14 mg/dL (ref 6–23)
CO2: 28 meq/L (ref 19–32)
Calcium: 9.4 mg/dL (ref 8.4–10.5)
Chloride: 102 meq/L (ref 96–112)
Creatinine, Ser: 0.97 mg/dL (ref 0.40–1.20)
GFR: 72.19 mL/min
Glucose, Bld: 232 mg/dL — ABNORMAL HIGH (ref 70–99)
Potassium: 3.9 meq/L (ref 3.5–5.1)
Sodium: 138 meq/L (ref 135–145)
Total Bilirubin: 0.6 mg/dL (ref 0.2–1.2)
Total Protein: 7.8 g/dL (ref 6.0–8.3)

## 2017-03-20 LAB — LIPID PANEL
Cholesterol: 118 mg/dL (ref 0–200)
HDL: 51.8 mg/dL
LDL Cholesterol: 45 mg/dL (ref 0–99)
NonHDL: 66.23
Total CHOL/HDL Ratio: 2
Triglycerides: 104 mg/dL (ref 0.0–149.0)
VLDL: 20.8 mg/dL (ref 0.0–40.0)

## 2017-03-20 LAB — CBC
HCT: 38.8 % (ref 36.0–46.0)
Hemoglobin: 12.7 g/dL (ref 12.0–15.0)
MCHC: 32.7 g/dL (ref 30.0–36.0)
MCV: 81.1 fl (ref 78.0–100.0)
Platelets: 303 10*3/uL (ref 150.0–400.0)
RBC: 4.79 Mil/uL (ref 3.87–5.11)
RDW: 15.4 % (ref 11.5–15.5)
WBC: 6.9 10*3/uL (ref 4.0–10.5)

## 2017-03-20 LAB — HEMOGLOBIN A1C: Hgb A1c MFr Bld: 9.7 % — ABNORMAL HIGH (ref 4.6–6.5)

## 2017-03-20 LAB — VITAMIN D 25 HYDROXY (VIT D DEFICIENCY, FRACTURES): VITD: 16.71 ng/mL — ABNORMAL LOW (ref 30.00–100.00)

## 2017-03-20 MED ORDER — VITAMIN D (ERGOCALCIFEROL) 1.25 MG (50000 UNIT) PO CAPS: 50000.0000 [IU] | ORAL_CAPSULE | ORAL | 0 refills | Status: DC

## 2017-03-20 MED ORDER — LOSARTAN POTASSIUM 50 MG PO TABS: 50.0000 mg | ORAL_TABLET | Freq: Every day | ORAL | 3 refills | Status: DC

## 2017-03-20 MED ORDER — FUROSEMIDE 20 MG PO TABS: ORAL_TABLET | ORAL | 3 refills | Status: DC

## 2017-03-20 MED ORDER — AMLODIPINE BESYLATE 10 MG PO TABS: 10.0000 mg | ORAL_TABLET | Freq: Every day | ORAL | 3 refills | Status: DC

## 2017-03-20 MED ORDER — OMEPRAZOLE 20 MG PO CPDR: 20.0000 mg | DELAYED_RELEASE_CAPSULE | Freq: Every day | ORAL | 3 refills | Status: DC

## 2017-03-20 MED ORDER — ATORVASTATIN CALCIUM 20 MG PO TABS: 20.0000 mg | ORAL_TABLET | Freq: Every day | ORAL | 3 refills | Status: DC

## 2017-03-20 NOTE — Progress Notes (Signed)
HPI:  Pt presents to the clinic today for his Medicare Wellness Exam. She is also due to follow up chronic conditions.   Arthritis: Mainly in her back and knees. She takes Aleve and Bengay as needed with some relief.  DM 2: [name] was 9.3%, 01/2017. Her sugars are running 67-245. She is taking Novolin as prescribed. She follows with Dr. Loanne Drilling.  Fibromyalgia: She reports she hurts all over all the time. She takes Aleve daily.  GERD: She reports this has been occurring at least 3 times per week. She can not pinpoint what is causing this. She is taking Maalox OTC as needed for symptom relief.  HLD: [name] was 59, 08/2015. She is taking Atorvastatin and Fish Oil as prescribed. She has been trying to consume a low fat diet.   HTN: Her BP today is 132/84. She is taking Amlodipine, Lasix, Losartan as prescribed. ECG from 09/2014 reviewed.  Past Medical History:  Diagnosis Date  . Arthritis   . Diabetes mellitus without complication ([place])    Type II  . Fibromyalgia   . GERD (gastroesophageal reflux disease)    better after gall bladder surgery-   . Hyperlipidemia   . Hypertension   . Shortness of breath dyspnea    with exertion    Current Outpatient Medications  Medication Sig Dispense Refill  . amLODipine (NORVASC) 10 MG tablet Take 1 tablet (10 mg total) by mouth daily. MUST SCHEDULE ANNUAL PHYSICAL EXAM 30 tablet 1  . aspirin 81 MG tablet Take 81 mg by mouth daily. Reported on 03/29/2015    . atorvastatin (LIPITOR) 20 MG tablet TAKE 1 TABLET BY MOUTH DAILY 90 tablet 3  . Blood Glucose Monitoring Suppl (ONE TOUCH ULTRA SYSTEM KIT) w/Device KIT 2  times daily. E11.0=9 1 each 2  . clotrimazole-betamethasone (LOTRISONE) cream Apply 1 application topically 3 (three) times daily. As needed for itching. 45 g 11  . docusate sodium (COLACE) 100 MG capsule Take 100 mg by mouth daily as needed for mild constipation. Reported on 03/29/2015    . furosemide (LASIX) 20 MG tablet TAKE ONE  (1) TABLET BY MOUTH DAILY AS NEEDED 30 tablet 0  . glucose blood (FREESTYLE LITE) test strip 1 each by Other route 2 (two) times daily. And lancets 2/day 100 each 12  . glucose blood (ONE TOUCH ULTRA TEST) test strip 2 times daily. Dx Code: e11.9 200 each 2  . insulin NPH Human (NOVOLIN N) 100 UNIT/ML injection Inject 0.35 mLs (35 Units total) into the skin daily before breakfast. 10 mL 11  . losartan (COZAAR) 50 MG tablet Take 1 tablet (50 mg total) by mouth daily. MUST SCHEDULE ANNUAL EXAM 90 tablet 0  . naproxen sodium (ANAPROX) 220 MG tablet Take 220 mg by mouth as needed (for pain).     . Omega-3 Fatty Acids (FISH OIL PO) Take 2,400 mg by mouth daily.     Glory Rosebush DELICA LANCETS 25D MISC 2 times daily. Dx Code: e11.9 200 each 2  . triamcinolone cream (KENALOG) 0.1 % Apply 1 application topically 2 (two) times daily. 30 g 0   Current Facility-Administered Medications  Medication Dose Route Frequency Provider Last Rate Last Dose  . betamethasone acetate-betamethasone sodium phosphate (CELESTONE) injection 3 mg  3 mg Intramuscular Once Edrick Kins, DPM        No Known Allergies  Family History  Problem Relation Age of Onset  . Arthritis Mother   . Cancer Mother  Ovarian  . Diabetes Mother   . Heart disease Father   . Hypertension Father     Social History   Socioeconomic History  . Marital status: Married    Spouse name: Not on file  . Number of children: Not on file  . Years of education: Not on file  . Highest education level: Not on file  Social Needs  . Financial resource strain: Not on file  . Food insecurity - worry: Not on file  . Food insecurity - inability: Not on file  . Transportation needs - medical: Not on file  . Transportation needs - non-medical: Not on file  Occupational History  . Not on file  Tobacco Use  . Smoking status: Never Smoker  . Smokeless tobacco: Never Used  Substance and Sexual Activity  . Alcohol use: No  . Drug use: No  .  Sexual activity: Not Currently  Other Topics Concern  . Not on file  Social History Narrative  . Not on file    Hospitiliaztions: None  Health Maintenance:    Flu: 01/2016  Tetanus: > 10 years ago  Pneumovax: never  Prevnar: never  Zostavax: never  Mammogram: hysterectomy  Pap Smear: 06/2014  Bone Density: never  Colon Screening: 04/2015  Eye Doctor: annually  Dental Exam: biannually   Providers:   PCP: Webb Silversmith, NP-C  Endocrinologist: Dr. Loanne Drilling   I have personally reviewed and have noted:  1. The patient's medical and social history 2. Their use of alcohol, tobacco or illicit drugs 3. Their current medications and supplements 4. The patient's functional ability including ADL's, fall risks, home safety risks and hearing or visual impairment. 5. Diet and physical activities 6. Evidence for depression or mood disorder  Subjective:   Review of Systems:   Constitutional: Denies fever, malaise, fatigue, headache or abrupt weight changes.  HEENT: Denies eye pain, eye redness, ear pain, ringing in the ears, wax buildup, runny nose, nasal congestion, bloody nose, or sore throat. Respiratory: Denies difficulty breathing, shortness of breath, cough or sputum production.   Cardiovascular: Pt reports swelling in her feet. Denies chest pain, chest tightness, palpitations or swelling in the hands.  Gastrointestinal: Pt reports reflux. Denies abdominal pain, bloating, constipation, diarrhea or blood in the stool.  GU: Denies urgency, frequency, pain with urination, burning sensation, blood in urine, odor or discharge. Musculoskeletal: Pt reports chronic muscle and joint pain. Denies decrease in range of motion, difficulty with gait, or joint  swelling.  Skin: Denies redness, rashes, lesions or ulcercations.  Neurological: Denies dizziness, difficulty with memory, difficulty with speech or problems with balance and coordination.  Psych: Denies anxiety, depression, SI/HI.  No  other specific complaints in a complete review of systems (except as listed in HPI above).  Objective:  PE:   BP 132/84   Pulse 80   Temp 98.1 F (36.7 C) (Oral)   Ht _0  (1.702 m)   Wt 284 lb (128.8 kg)   SpO2 98%   BMI 44.48 kg/m  Wt Readings from Last 3 Encounters:  03/20/17 284 lb (128.8 kg)  01/30/17 290 lb (131.5 kg)  10/28/16 292 lb 6.4 oz (132.6 kg)    General: Appears her stated age, obese in NAD. Skin: Warm, dry and intact. No ulcerations noted. Cardiovascular: Normal rate and rhythm. S1,S2 noted.  Murmur noted. 1+ non pitting BLE edema.  Pulmonary/Chest: Normal effort and positive vesicular breath sounds. No respiratory distress. No wheezes, rales or ronchi noted.  Abdomen: Soft and nontender.  Normal bowel sounds. No distention or masses noted.  Musculoskeletal:  Strength 5/5 BUE/BLE. Gait slow but steady.  Neurological: Alert and oriented. Sensation intact to BLE. Psychiatric: Mood and affect normal. Behavior is normal. Judgment and thought content normal.    BMET    Component Value Date/Time   NA 142 09/07/2015 1135   K 3.6 09/07/2015 1135   CL 103 09/07/2015 1135   CO2 32 09/07/2015 1135   GLUCOSE 108 (H) 09/07/2015 1135   BUN 13 09/07/2015 1135   CREATININE 1.01 09/07/2015 1135   CALCIUM 9.4 09/07/2015 1135   GFRNONAA 55 (L) 10/20/2014 1039   GFRAA >60 10/20/2014 1039    Lipid Panel     Component Value Date/Time   CHOL 132 09/07/2015 1135   TRIG 150.0 (H) 09/07/2015 1135   HDL 42.60 09/07/2015 1135   CHOLHDL 3 09/07/2015 1135   VLDL 30.0 09/07/2015 1135   LDLCALC 59 09/07/2015 1135    CBC    Component Value Date/Time   WBC 7.1 09/07/2015 1135   RBC 4.69 09/07/2015 1135   HGB 12.3 09/07/2015 1135   HCT 37.2 09/07/2015 1135   PLT 294.0 09/07/2015 1135   MCV 79.4 09/07/2015 1135   MCH 25.6 (L) 10/20/2014 1039   MCHC 33.0 09/07/2015 1135   RDW 16.5 (H) 09/07/2015 1135   LYMPHSABS 2.2 09/06/2014 1048   MONOABS 0.5 09/06/2014 1048    EOSABS 0.3 09/06/2014 1048   BASOSABS 0.0 09/06/2014 1048    Hgb A1C Lab Results  Component Value Date   HGBA1C 9.3 01/30/2017      Assessment and Plan:   Medicare Annual Wellness Visit:  Diet: She does not eat meat. She consumes fruits and veggies daily. She doe eat some fried foods. She drinks mostly Pepsi Physical activity: Sedentary Depression/mood screen: Negative Hearing: Intact to whispered voice Visual acuity: Grossly normal, performs annual eye exam  ADLs: Capable Fall risk: None Home safety: Good Cognitive evaluation: Intact to orientation, naming, recall and repetition EOL planning: No adv directives, full code/ I agree  Preventative Medicine: She declines flu, tetanus, pneumovax, prevnar, zostovax or shingrix. Mammogram ordered, she will call [facility] to schedule, number provided. She declines pap smear, bone density. Colon screening UTD. Encouraged her to consume a balanced diet and exercise regimen. Advised her to see an eye doctor and dentist annually. Will check CBC, CMET, Lipid, A1C, Vit D and Hep C today.   Next appointment: 6 months, follow up chronic conditions.   Webb Silversmith, NP

## 2017-03-20 NOTE — Patient Instructions (Signed)
Health Maintenance for Postmenopausal Women Menopause is a normal process in which your reproductive ability comes to an end. This process happens gradually over a span of months to years, usually between the ages of 22 and 9. Menopause is complete when you have missed 12 consecutive menstrual periods. It is important to talk with your health care provider about some of the most common conditions that affect postmenopausal women, such as heart disease, cancer, and bone loss (osteoporosis). Adopting a healthy lifestyle and getting preventive care can help to promote your health and wellness. Those actions can also lower your chances of developing some of these common conditions. What should I know about menopause? During menopause, you may experience a number of symptoms, such as:  Moderate-to-severe hot flashes.  Night sweats.  Decrease in sex drive.  Mood swings.  Headaches.  Tiredness.  Irritability.  Memory problems.  Insomnia.  Choosing to treat or not to treat menopausal changes is an individual decision that you make with your health care provider. What should I know about hormone replacement therapy and supplements? Hormone therapy products are effective for treating symptoms that are associated with menopause, such as hot flashes and night sweats. Hormone replacement carries certain risks, especially as you become older. If you are thinking about using estrogen or estrogen with progestin treatments, discuss the benefits and risks with your health care provider. What should I know about heart disease and stroke? Heart disease, heart attack, and stroke become more likely as you age. This may be due, in part, to the hormonal changes that your body experiences during menopause. These can affect how your body processes dietary fats, triglycerides, and cholesterol. Heart attack and stroke are both medical emergencies. There are many things that you can do to help prevent heart disease  and stroke:  Have your blood pressure checked at least every 1-2 years. High blood pressure causes heart disease and increases the risk of stroke.  If you are 53-22 years old, ask your health care provider if you should take aspirin to prevent a heart attack or a stroke.  Do not use any tobacco products, including cigarettes, chewing tobacco, or electronic cigarettes. If you need help quitting, ask your health care provider.  It is important to eat a healthy diet and maintain a healthy weight. ? Be sure to include plenty of vegetables, fruits, low-fat dairy products, and lean protein. ? Avoid eating foods that are high in solid fats, added sugars, or salt (sodium).  Get regular exercise. This is one of the most important things that you can do for your health. ? Try to exercise for at least 150 minutes each week. The type of exercise that you do should increase your heart rate and make you sweat. This is known as moderate-intensity exercise. ? Try to do strengthening exercises at least twice each week. Do these in addition to the moderate-intensity exercise.  Know your numbers.Ask your health care provider to check your cholesterol and your blood glucose. Continue to have your blood tested as directed by your health care provider.  What should I know about cancer screening? There are several types of cancer. Take the following steps to reduce your risk and to catch any cancer development as early as possible. Breast Cancer  Practice breast self-awareness. ? This means understanding how your breasts normally appear and feel. ? It also means doing regular breast self-exams. Let your health care provider know about any changes, no matter how small.  If you are 40  or older, have a clinician do a breast exam (clinical breast exam or CBE) every year. Depending on your age, family history, and medical history, it may be recommended that you also have a yearly breast X-ray (mammogram).  If you  have a family history of breast cancer, talk with your health care provider about genetic screening.  If you are at high risk for breast cancer, talk with your health care provider about having an MRI and a mammogram every year.  Breast cancer (BRCA) gene test is recommended for women who have family members with BRCA-related cancers. Results of the assessment will determine the need for genetic counseling and BRCA1 and for BRCA2 testing. BRCA-related cancers include these types: ? Breast. This occurs in males or females. ? Ovarian. ? Tubal. This may also be called fallopian tube cancer. ? Cancer of the abdominal or pelvic lining (peritoneal cancer). ? Prostate. ? Pancreatic.  Cervical, Uterine, and Ovarian Cancer Your health care provider may recommend that you be screened regularly for cancer of the pelvic organs. These include your ovaries, uterus, and vagina. This screening involves a pelvic exam, which includes checking for microscopic changes to the surface of your cervix (Pap test).  For women ages 21-65, health care providers may recommend a pelvic exam and a Pap test every three years. For women ages 79-65, they may recommend the Pap test and pelvic exam, combined with testing for human papilloma virus (HPV), every five years. Some types of HPV increase your risk of cervical cancer. Testing for HPV may also be done on women of any age who have unclear Pap test results.  Other health care providers may not recommend any screening for nonpregnant women who are considered low risk for pelvic cancer and have no symptoms. Ask your health care provider if a screening pelvic exam is right for you.  If you have had past treatment for cervical cancer or a condition that could lead to cancer, you need Pap tests and screening for cancer for at least 20 years after your treatment. If Pap tests have been discontinued for you, your risk factors (such as having a new sexual partner) need to be  reassessed to determine if you should start having screenings again. Some women have medical problems that increase the chance of getting cervical cancer. In these cases, your health care provider may recommend that you have screening and Pap tests more often.  If you have a family history of uterine cancer or ovarian cancer, talk with your health care provider about genetic screening.  If you have vaginal bleeding after reaching menopause, tell your health care provider.  There are currently no reliable tests available to screen for ovarian cancer.  Lung Cancer Lung cancer screening is recommended for adults 69-62 years old who are at high risk for lung cancer because of a history of smoking. A yearly low-dose CT scan of the lungs is recommended if you:  Currently smoke.  Have a history of at least 30 pack-years of smoking and you currently smoke or have quit within the past 15 years. A pack-year is smoking an average of one pack of cigarettes per day for one year.  Yearly screening should:  Continue until it has been 15 years since you quit.  Stop if you develop a health problem that would prevent you from having lung cancer treatment.  Colorectal Cancer  This type of cancer can be detected and can often be prevented.  Routine colorectal cancer screening usually begins at  age 42 and continues through age 45.  If you have risk factors for colon cancer, your health care provider may recommend that you be screened at an earlier age.  If you have a family history of colorectal cancer, talk with your health care provider about genetic screening.  Your health care provider may also recommend using home test kits to check for hidden blood in your stool.  A small camera at the end of a tube can be used to examine your colon directly (sigmoidoscopy or colonoscopy). This is done to check for the earliest forms of colorectal cancer.  Direct examination of the colon should be repeated every  5-10 years until age 71. However, if early forms of precancerous polyps or small growths are found or if you have a family history or genetic risk for colorectal cancer, you may need to be screened more often.  Skin Cancer  Check your skin from head to toe regularly.  Monitor any moles. Be sure to tell your health care provider: ? About any new moles or changes in moles, especially if there is a change in a mole's shape or color. ? If you have a mole that is larger than the size of a pencil eraser.  If any of your family members has a history of skin cancer, especially at a young age, talk with your health care provider about genetic screening.  Always use sunscreen. Apply sunscreen liberally and repeatedly throughout the day.  Whenever you are outside, protect yourself by wearing long sleeves, pants, a wide-brimmed hat, and sunglasses.  What should I know about osteoporosis? Osteoporosis is a condition in which bone destruction happens more quickly than new bone creation. After menopause, you may be at an increased risk for osteoporosis. To help prevent osteoporosis or the bone fractures that can happen because of osteoporosis, the following is recommended:  If you are 46-71 years old, get at least 1,000 mg of calcium and at least 600 mg of vitamin D per day.  If you are older than age 55 but younger than age 65, get at least 1,200 mg of calcium and at least 600 mg of vitamin D per day.  If you are older than age 54, get at least 1,200 mg of calcium and at least 800 mg of vitamin D per day.  Smoking and excessive alcohol intake increase the risk of osteoporosis. Eat foods that are rich in calcium and vitamin D, and do weight-bearing exercises several times each week as directed by your health care provider. What should I know about how menopause affects my mental health? Depression may occur at any age, but it is more common as you become older. Common symptoms of depression  include:  Low or sad mood.  Changes in sleep patterns.  Changes in appetite or eating patterns.  Feeling an overall lack of motivation or enjoyment of activities that you previously enjoyed.  Frequent crying spells.  Talk with your health care provider if you think that you are experiencing depression. What should I know about immunizations? It is important that you get and maintain your immunizations. These include:  Tetanus, diphtheria, and pertussis (Tdap) booster vaccine.  Influenza every year before the flu season begins.  Pneumonia vaccine.  Shingles vaccine.  Your health care provider may also recommend other immunizations. This information is not intended to replace advice given to you by your health care provider. Make sure you discuss any questions you have with your health care provider. Document Released: 02/22/2005  Document Revised: 07/21/2015 Document Reviewed: 10/04/2014 Elsevier Interactive Patient Education  2018 [employer]  

## 2017-03-20 NOTE — Progress Notes (Signed)
e

## 2017-03-21 ENCOUNTER — Encounter: Payer: Self-pay | Admitting: Internal Medicine

## 2017-03-21 DIAGNOSIS — M1990 Unspecified osteoarthritis, unspecified site: Secondary | ICD-10-CM | POA: Insufficient documentation

## 2017-03-21 DIAGNOSIS — K219 Gastro-esophageal reflux disease without esophagitis: Secondary | ICD-10-CM | POA: Insufficient documentation

## 2017-03-21 DIAGNOSIS — M797 Fibromyalgia: Secondary | ICD-10-CM | POA: Insufficient documentation

## 2017-03-21 LAB — HEPATITIS C ANTIBODY
Hepatitis C Ab: NONREACTIVE
SIGNAL TO CUT-OFF: 0.02 (ref <1.00)

## 2017-03-21 NOTE — Assessment & Plan Note (Signed)
CMET and Lipid profile today Encouraged her to consume a low fat diet Continue Atorvastatin and Fish Oil, will adjust if needed based on labs, will need a refill as well

## 2017-03-21 NOTE — Assessment & Plan Note (Signed)
Controlled on Amlodipine, Losartan and Lasix, medications refilled today CBC and CMET today Reinforced DASH diet and exercise for weight loss

## 2017-03-21 NOTE — Assessment & Plan Note (Signed)
Discussed the importance of regular exercise Continue Aleve as needed

## 2017-03-21 NOTE — Assessment & Plan Note (Signed)
Continue Aleve and Bengay as needed

## 2017-03-21 NOTE — Assessment & Plan Note (Signed)
A1C today No microalbumin secondary to ARB therapy Encouraged her to consume a low carb diet and exercise for weight loss Continue Novolin Foot exam today Encouraged yearly eye exams She declines flu or pneumonia vaccines She will continue to follow with Dr. Everardo AllEllison

## 2017-03-21 NOTE — Assessment & Plan Note (Signed)
Discussed trying to identify foods that are triggering her reflux Discussed how weight loss could help improve reflux eRx for Omeprazole 20 mg daily

## 2017-03-24 MED ORDER — FREESTYLE LIBRE READER DEVI: 1.0000 | Freq: Once | 0 refills | Status: AC

## 2017-03-24 MED ORDER — FREESTYLE LIBRE 14 DAY SENSOR MISC: 1.0000 | 0 refills | Status: DC

## 2017-03-24 NOTE — Addendum Note (Signed)
Addended by: Roena MaladyEVONTENNO, MELANIE Y on: 03/24/2017 01:50 PM   Modules accepted: Orders

## 2017-04-01 ENCOUNTER — Ambulatory Visit: Payer: Medicare HMO | Admitting: Endocrinology

## 2017-04-01 ENCOUNTER — Encounter: Payer: Self-pay | Admitting: Endocrinology

## 2017-04-01 VITALS — BP 152/80 | HR 88 | Wt 291.2 lb

## 2017-04-01 DIAGNOSIS — E1165 Type 2 diabetes mellitus with hyperglycemia: Secondary | ICD-10-CM | POA: Diagnosis not present

## 2017-04-01 DIAGNOSIS — E118 Type 2 diabetes mellitus with unspecified complications: Secondary | ICD-10-CM | POA: Diagnosis not present

## 2017-04-01 DIAGNOSIS — IMO0002 Reserved for concepts with insufficient information to code with codable children: Secondary | ICD-10-CM

## 2017-04-01 MED ORDER — INSULIN REGULAR HUMAN 100 UNIT/ML IJ SOLN: 35.0000 [IU] | Freq: Every day | INTRAMUSCULAR | 11 refills | Status: DC

## 2017-04-01 MED ORDER — INSULIN NPH (HUMAN) (ISOPHANE) 100 UNIT/ML ~~LOC~~ SUSP: 45.0000 [IU] | Freq: Every day | SUBCUTANEOUS | 11 refills | Status: DC

## 2017-04-01 NOTE — Patient Instructions (Addendum)
Your blood pressure is high today.  Please see your primary care provider soon, to have it rechecked check your blood sugar twice a day.  vary the time of day when you check, between before the 3 meals, and at bedtime.  also check if you have symptoms of your blood sugar being too high or too low.  please keep a record of the readings and bring it to your next appointment here (or you can bring the meter itself).  You can write it on any piece of paper.  please call us sooner if your blood sugar goes below 70, or if you have a lot of readings over 200.  I have sent a prescription to your pharmacy, to increase the NPH to 45 units each morning, and Please continue the same regular insulin.   On this type of insulin schedule, you should eat meals on a regular schedule.  If a meal is missed or significantly delayed, your blood sugar could go low.   Please come back for a follow-up appointment in 2 months.

## 2017-04-01 NOTE — Progress Notes (Signed)
Subjective:    Patient ID: Bethany [name], female    DOB: April 12, 1945, 72 y.o.   MRN: [id number]  HPI Pt returns for f/u of diabetes mellitus: DM type: Insulin-requiring type 2 Dx'ed: 1998.  Complications: polyneuropathy.  Therapy: insulin since 2009.  GDM: never DKA: never Severe hypoglycemia: never. Pancreatitis: never. Other: she takes 2 QD insulins, after poor results with multiple daily injections, but she often eats just 2 meals per day; she takes human insulin, due to cost.  Interval history: She says she never misses her insulin.  no cbg record, but states cbg varies from 67-300's.  It is lowest in the afternoon, and highest at HS.  Past Medical History:  Diagnosis Date  . Arthritis   . Diabetes mellitus without complication ([place])    Type II  . Fibromyalgia   . GERD (gastroesophageal reflux disease)    better after gall bladder surgery-   . Hyperlipidemia   . Hypertension   . Shortness of breath dyspnea    with exertion    Past Surgical History:  Procedure Laterality Date  . ABDOMINAL HYSTERECTOMY  1997  . CATARACT EXTRACTION Bilateral 2014  . CHOLECYSTECTOMY  2010  . COLONOSCOPY    . COLONOSCOPY WITH PROPOFOL N/A 04/21/2015   Procedure: COLONOSCOPY WITH PROPOFOL;  Surgeon: Hulen Luster, MD;  Location: [facility] ENDOSCOPY;  Service: Gastroenterology;  Laterality: N/A;  . EYE SURGERY Bilateral    Cataract  . LUMBAR WOUND DEBRIDEMENT N/A 10/20/2014   Procedure: LUMBAR WOUND DEBRIDEMENT;  Surgeon: Eustace Moore, MD;  Location: [place] NEURO ORS;  Service: Neurosurgery;  Laterality: N/A;  . MAXIMUM ACCESS (MAS)POSTERIOR LUMBAR INTERBODY FUSION (PLIF) 1 LEVEL N/A 09/16/2014   Procedure: L/4-5 FOR MAXIMUM ACCESS (MAS) POSTERIOR LUMBAR INTERBODY FUSION (PLIF) 1 LEVEL;  Surgeon: Eustace Moore, MD;  Location: [place] NEURO ORS;  Service: Neurosurgery;  Laterality: N/A;  L/4-5 FOR MAXIMUM ACCESS (MAS) POSTERIOR LUMBAR INTERBODY FUSION (PLIF) 1 LEVEL    Social History   Socioeconomic History    . Marital status: Married    Spouse name: Not on file  . Number of children: Not on file  . Years of education: Not on file  . Highest education level: Not on file  Social Needs  . Financial resource strain: Not on file  . Food insecurity - worry: Not on file  . Food insecurity - inability: Not on file  . Transportation needs - medical: Not on file  . Transportation needs - non-medical: Not on file  Occupational History  . Not on file  Tobacco Use  . Smoking status: Never Smoker  . Smokeless tobacco: Never Used  Substance and Sexual Activity  . Alcohol use: No  . Drug use: No  . Sexual activity: Not Currently  Other Topics Concern  . Not on file  Social History Narrative  . Not on file    Current Outpatient Medications on File Prior to Visit  Medication Sig Dispense Refill  . amLODipine (NORVASC) 10 MG tablet Take 1 tablet (10 mg total) by mouth daily. 90 tablet 3  . aspirin 81 MG tablet Take 81 mg by mouth daily. Reported on 03/29/2015    . atorvastatin (LIPITOR) 20 MG tablet Take 1 tablet (20 mg total) by mouth daily. 90 tablet 3  . Blood Glucose Monitoring Suppl (ONE TOUCH ULTRA SYSTEM KIT) w/Device KIT 2  times daily. E11.0=9 1 each 2  . clotrimazole-betamethasone (LOTRISONE) cream Apply 1 application topically 3 (three) times daily. As needed for  itching. 45 g 11  . Continuous Blood Gluc Sensor (FREESTYLE LIBRE 14 DAY SENSOR) MISC 1 each by Does not apply route every 14 (fourteen) days. 2 each 0  . docusate sodium (COLACE) 100 MG capsule Take 100 mg by mouth daily as needed for mild constipation. Reported on 03/29/2015    . furosemide (LASIX) 20 MG tablet TAKE ONE (1) TABLET BY MOUTH DAILY AS NEEDED 90 tablet 3  . glucose blood (FREESTYLE LITE) test strip 1 each by Other route 2 (two) times daily. And lancets 2/day 100 each 12  . glucose blood (ONE TOUCH ULTRA TEST) test strip 2 times daily. Dx Code: e11.9 200 each 2  . losartan (COZAAR) 50 MG tablet Take 1 tablet (50 mg  total) by mouth daily. 90 tablet 3  . naproxen sodium (ANAPROX) 220 MG tablet Take 220 mg by mouth as needed (for pain).     . Omega-3 Fatty Acids (FISH OIL PO) Take 2,400 mg by mouth daily.     Marland Kitchen omeprazole (PRILOSEC) 20 MG capsule Take 1 capsule (20 mg total) by mouth daily. 90 capsule 3  . ONETOUCH DELICA LANCETS 27X MISC 2 times daily. Dx Code: e11.9 200 each 2  . triamcinolone cream (KENALOG) 0.1 % Apply 1 application topically 2 (two) times daily. 30 g 0  . Vitamin D, Ergocalciferol, (DRISDOL) 50000 units CAPS capsule Take 1 capsule (50,000 Units total) by mouth every 7 (seven) days. 12 capsule 0   Current Facility-Administered Medications on File Prior to Visit  Medication Dose Route Frequency Provider Last Rate Last Dose  . betamethasone acetate-betamethasone sodium phosphate (CELESTONE) injection 3 mg  3 mg Intramuscular Once Edrick Kins, DPM        No Known Allergies  Family History  Problem Relation Age of Onset  . Arthritis Mother   . Cancer Mother        Ovarian  . Diabetes Mother   . Heart disease Father   . Hypertension Father     BP (!) 152/80 (BP Location: Left Arm, Patient Position: Sitting, Cuff Size: Normal)   Pulse 88   Wt 291 lb 3.2 oz (132.1 kg)   SpO2 98%   BMI 45.61 kg/m    Review of Systems No weight change.      Objective:   Physical Exam VITAL SIGNS:  See vs page.   GENERAL: no distress.  Morbid obesity.   Pulses: foot pulses are intact bilaterally.   MSK: no deformity of the feet or ankles.  CV: trace bilat edema of the legs Skin:  no ulcer on the feet or ankles.  normal temp on the feet and ankles.   Neuro: sensation is intact to touch on the feet and ankles.   Ext: There is bilateral onychomycosis of the toenails.     Lab Results  Component Value Date   HGBA1C 9.7 (H) 03/20/2017   Lab Results  Component Value Date   CREATININE 0.97 03/20/2017   BUN 14 03/20/2017   NA 138 03/20/2017   K 3.9 03/20/2017   CL 102 03/20/2017    CO2 28 03/20/2017       Assessment & Plan:  Insulin-requiring type 2 DM, with polyneuropathy: worse.  Patient Instructions  Your blood pressure is high today.  Please see your primary care provider soon, to have it rechecked check your blood sugar twice a day.  vary the time of day when you check, between before the 3 meals, and at bedtime.  also check if  you have symptoms of your blood sugar being too high or too low.  please keep a record of the readings and bring it to your next appointment here (or you can bring the meter itself).  You can write it on any piece of paper.  please call us sooner if your blood sugar goes below 70, or if you have a lot of readings over 200.  I have sent a prescription to your pharmacy, to increase the NPH to 45 units each morning, and Please continue the same regular insulin.   On this type of insulin schedule, you should eat meals on a regular schedule.  If a meal is missed or significantly delayed, your blood sugar could go low.   Please come back for a follow-up appointment in 2 months.

## 2017-05-05 ENCOUNTER — Telehealth: Payer: Self-pay | Admitting: Internal Medicine

## 2017-05-05 ENCOUNTER — Other Ambulatory Visit: Payer: Self-pay

## 2017-05-05 ENCOUNTER — Telehealth: Payer: Self-pay | Admitting: Endocrinology

## 2017-05-05 MED ORDER — GLUCOSE BLOOD VI STRP: ORAL_STRIP | 2 refills | Status: DC

## 2017-05-05 NOTE — Telephone Encounter (Signed)
completed

## 2017-05-05 NOTE — Telephone Encounter (Signed)
Patient needs new Rx for One Touch Ultra Testing Strips sent [facility] on [address] [place] (which is her new pharmacy-old pharmacy closed down)

## 2017-05-05 NOTE — Telephone Encounter (Signed)
Copied from CRM [phone]#[id number]. Topic: Quick Communication - See Telephone Encounter >> May 05, 2017 10:45 AM Windy KalataMichael, Mayela Bullard L, NT wrote: CRM for notification. See Telephone encounter for: 05/05/17.  Patient is needing a refill on glucose blood (ONE TOUCH ULTRA TEST) test strip. I informed patient it looks like on my end that Dr. Everardo AllEllison was the last to prescribe those. She states Nicki ReaperRegina Baity prescribes them to her. Please advise.  [facility] [address] - [place], [place] - [address] [place] [id number] Phone: [phone] Fax: [phone]

## 2017-05-05 NOTE — Telephone Encounter (Signed)
Called pt. To let her know Dr. Everardo AllEllison has been refilling her One Touch Ultra test strips. States she will get them from him.

## 2017-05-06 ENCOUNTER — Telehealth: Payer: Self-pay | Admitting: *Deleted

## 2017-05-06 NOTE — Telephone Encounter (Signed)
PA for OneTouch Ultra Blue test strips initiated via CoverMyMeds.

## 2017-05-08 ENCOUNTER — Other Ambulatory Visit: Payer: Self-pay

## 2017-05-08 MED ORDER — ACCU-CHEK MULTICLIX LANCETS MISC: 12 refills | Status: DC

## 2017-05-08 MED ORDER — ACCU-CHEK GUIDE ME W/DEVICE KIT: 1.0000 | PACK | Freq: Every day | 0 refills | Status: DC

## 2017-05-08 MED ORDER — GLUCOSE BLOOD VI STRP: ORAL_STRIP | 12 refills | Status: DC

## 2017-05-08 NOTE — Telephone Encounter (Signed)
I called patient & according to what's covered by medicare as well as humana I sent in a prescription for accu-chek guide meter, strips & lancets. I have called & informed patient of these changes.

## 2017-05-08 NOTE — Telephone Encounter (Signed)
PA is denied. Please advise. Thanks! 

## 2017-05-08 NOTE — Telephone Encounter (Signed)
Any brand is OK

## 2017-05-22 ENCOUNTER — Other Ambulatory Visit: Payer: Self-pay | Admitting: *Deleted

## 2017-05-22 MED ORDER — ACCU-CHEK FASTCLIX LANCETS MISC: 12 refills | Status: DC

## 2017-06-02 ENCOUNTER — Ambulatory Visit: Payer: Medicare HMO | Admitting: Endocrinology

## 2017-06-02 ENCOUNTER — Encounter: Payer: Self-pay | Admitting: Endocrinology

## 2017-06-02 VITALS — BP 157/80 | HR 88 | Wt 296.4 lb

## 2017-06-02 DIAGNOSIS — IMO0002 Reserved for concepts with insufficient information to code with codable children: Secondary | ICD-10-CM

## 2017-06-02 DIAGNOSIS — E118 Type 2 diabetes mellitus with unspecified complications: Secondary | ICD-10-CM | POA: Diagnosis not present

## 2017-06-02 DIAGNOSIS — E1165 Type 2 diabetes mellitus with hyperglycemia: Secondary | ICD-10-CM | POA: Diagnosis not present

## 2017-06-02 LAB — POCT GLYCOSYLATED HEMOGLOBIN (HGB A1C): Hemoglobin A1C: 8.1 % — AB (ref 4.0–5.6)

## 2017-06-02 MED ORDER — INSULIN REGULAR HUMAN 100 UNIT/ML IJ SOLN: 45.0000 [IU] | Freq: Every day | INTRAMUSCULAR | 11 refills | Status: DC

## 2017-06-02 MED ORDER — INSULIN NPH (HUMAN) (ISOPHANE) 100 UNIT/ML ~~LOC~~ SUSP: 35.0000 [IU] | Freq: Every day | SUBCUTANEOUS | 11 refills | Status: DC

## 2017-06-02 NOTE — Patient Instructions (Addendum)
Your blood pressure is high today.  Please see your primary care provider soon, to have it rechecked check your blood sugar twice a day.  vary the time of day when you check, between before the 3 meals, and at bedtime.  also check if you have symptoms of your blood sugar being too high or too low.  please keep a record of the readings and bring it to your next appointment here (or you can bring the meter itself).  You can write it on any piece of paper.  please call us sooner if your blood sugar goes below 70, or if you have a lot of readings over 200.  Please change the insulins to the numbers listed below.  On this type of insulin schedule, you should eat meals on a regular schedule.  If a meal is missed or significantly delayed, your blood sugar could go low.    Please come back for a follow-up appointment in 2 months.

## 2017-06-02 NOTE — Progress Notes (Signed)
Subjective:    Patient ID: Bethany [name], female    DOB: Apr 30, 1945, 72 y.o.   MRN: [id number]  HPI Pt returns for f/u of diabetes mellitus: DM type: Insulin-requiring type 2 Dx'ed: 1998.  Complications: polyneuropathy.  Therapy: insulin since 2009.  GDM: never DKA: never Severe hypoglycemia: never. Pancreatitis: never. Other: she takes 2 QD insulins, after poor results with multiple daily injections, but she often eats just 2 meals per day; she takes human insulin, due to cost.  Interval history: She says she never misses her insulin.  no cbg record, but states cbg varies from 50-300's.  It is lowest in the middle of the night, and highest in the afternoon.   Past Medical History:  Diagnosis Date  . Arthritis   . Diabetes mellitus without complication ([place])    Type II  . Fibromyalgia   . GERD (gastroesophageal reflux disease)    better after gall bladder surgery-   . Hyperlipidemia   . Hypertension   . Shortness of breath dyspnea    with exertion    Past Surgical History:  Procedure Laterality Date  . ABDOMINAL HYSTERECTOMY  1997  . CATARACT EXTRACTION Bilateral 2014  . CHOLECYSTECTOMY  2010  . COLONOSCOPY    . COLONOSCOPY WITH PROPOFOL N/A 04/21/2015   Procedure: COLONOSCOPY WITH PROPOFOL;  Surgeon: Hulen Luster, MD;  Location: [facility] ENDOSCOPY;  Service: Gastroenterology;  Laterality: N/A;  . EYE SURGERY Bilateral    Cataract  . LUMBAR WOUND DEBRIDEMENT N/A 10/20/2014   Procedure: LUMBAR WOUND DEBRIDEMENT;  Surgeon: Eustace Moore, MD;  Location: [place] NEURO ORS;  Service: Neurosurgery;  Laterality: N/A;  . MAXIMUM ACCESS (MAS)POSTERIOR LUMBAR INTERBODY FUSION (PLIF) 1 LEVEL N/A 09/16/2014   Procedure: L/4-5 FOR MAXIMUM ACCESS (MAS) POSTERIOR LUMBAR INTERBODY FUSION (PLIF) 1 LEVEL;  Surgeon: Eustace Moore, MD;  Location: [place] NEURO ORS;  Service: Neurosurgery;  Laterality: N/A;  L/4-5 FOR MAXIMUM ACCESS (MAS) POSTERIOR LUMBAR INTERBODY FUSION (PLIF) 1 LEVEL    Social History    Socioeconomic History  . Marital status: Married    Spouse name: Not on file  . Number of children: Not on file  . Years of education: Not on file  . Highest education level: Not on file  Occupational History  . Not on file  Social Needs  . Financial resource strain: Not on file  . Food insecurity:    Worry: Not on file    Inability: Not on file  . Transportation needs:    Medical: Not on file    Non-medical: Not on file  Tobacco Use  . Smoking status: Never Smoker  . Smokeless tobacco: Never Used  Substance and Sexual Activity  . Alcohol use: No  . Drug use: No  . Sexual activity: Not Currently  Lifestyle  . Physical activity:    Days per week: Not on file    Minutes per session: Not on file  . Stress: Not on file  Relationships  . Social connections:    Talks on phone: Not on file    Gets together: Not on file    Attends religious service: Not on file    Active member of club or organization: Not on file    Attends meetings of clubs or organizations: Not on file    Relationship status: Not on file  . Intimate partner violence:    Fear of current or ex partner: Not on file    Emotionally abused: Not on file  Physically abused: Not on file    Forced sexual activity: Not on file  Other Topics Concern  . Not on file  Social History Narrative  . Not on file    Current Outpatient Medications on File Prior to Visit  Medication Sig Dispense Refill  . ACCU-CHEK FASTCLIX LANCETS MISC Use as instructed twice daily. 102 each 12  . amLODipine (NORVASC) 10 MG tablet Take 1 tablet (10 mg total) by mouth daily. 90 tablet 3  . aspirin 81 MG tablet Take 81 mg by mouth daily. Reported on 03/29/2015    . atorvastatin (LIPITOR) 20 MG tablet Take 1 tablet (20 mg total) by mouth daily. 90 tablet 3  . Blood Glucose Monitoring Suppl (ACCU-CHEK GUIDE ME) w/Device KIT 1 Device by Does not apply route daily. 1 kit 0  . clotrimazole-betamethasone (LOTRISONE) cream Apply 1 application  topically 3 (three) times daily. As needed for itching. 45 g 11  . Continuous Blood Gluc Sensor (FREESTYLE LIBRE 14 DAY SENSOR) MISC 1 each by Does not apply route every 14 (fourteen) days. 2 each 0  . docusate sodium (COLACE) 100 MG capsule Take 100 mg by mouth daily as needed for mild constipation. Reported on 03/29/2015    . furosemide (LASIX) 20 MG tablet TAKE ONE (1) TABLET BY MOUTH DAILY AS NEEDED 90 tablet 3  . glucose blood (ACCU-CHEK GUIDE) test strip Used to check blood sugars twice daily. 100 each 12  . losartan (COZAAR) 50 MG tablet Take 1 tablet (50 mg total) by mouth daily. 90 tablet 3  . naproxen sodium (ANAPROX) 220 MG tablet Take 220 mg by mouth as needed (for pain).     . Omega-3 Fatty Acids (FISH OIL PO) Take 2,400 mg by mouth daily.     Marland Kitchen omeprazole (PRILOSEC) 20 MG capsule Take 1 capsule (20 mg total) by mouth daily. 90 capsule 3  . triamcinolone cream (KENALOG) 0.1 % Apply 1 application topically 2 (two) times daily. 30 g 0  . Vitamin D, Ergocalciferol, (DRISDOL) 50000 units CAPS capsule Take 1 capsule (50,000 Units total) by mouth every 7 (seven) days. 12 capsule 0   Current Facility-Administered Medications on File Prior to Visit  Medication Dose Route Frequency Provider Last Rate Last Dose  . betamethasone acetate-betamethasone sodium phosphate (CELESTONE) injection 3 mg  3 mg Intramuscular Once Edrick Kins, DPM        No Known Allergies  Family History  Problem Relation Age of Onset  . Arthritis Mother   . Cancer Mother        Ovarian  . Diabetes Mother   . Heart disease Father   . Hypertension Father     BP (!) 157/80   Pulse 88   Wt 296 lb 6.4 oz (134.4 kg)   SpO2 97%   BMI 46.42 kg/m   Review of Systems She denies hypoglycemia.     Objective:   Physical Exam VITAL SIGNS:  See vs page.   GENERAL: no distress.  Morbid obesity.   Pulses: foot pulses are intact bilaterally.   MSK: no deformity of the feet or ankles.  CV: trace bilat edema of the  legs Skin:  no ulcer on the feet or ankles.  normal temp on the feet and ankles.   Neuro: sensation is intact to touch on the feet and ankles.   Ext: There is bilateral onychomycosis of the toenails.    Lab Results  Component Value Date   HGBA1C 8.1 (A) 06/02/2017   Lab  Results  Component Value Date   CREATININE 0.97 03/20/2017   BUN 14 03/20/2017   NA 138 03/20/2017   K 3.9 03/20/2017   CL 102 03/20/2017   CO2 28 03/20/2017      Assessment & Plan:  HTN: is noted today Insulin-requiring type 2 DM, with polyneuropathy: Based on the pattern of her cbg's, she needs some adjustment in her therapy.     Patient Instructions  Your blood pressure is high today.  Please see your primary care provider soon, to have it rechecked check your blood sugar twice a day.  vary the time of day when you check, between before the 3 meals, and at bedtime.  also check if you have symptoms of your blood sugar being too high or too low.  please keep a record of the readings and bring it to your next appointment here (or you can bring the meter itself).  You can write it on any piece of paper.  please call us sooner if your blood sugar goes below 70, or if you have a lot of readings over 200.  Please change the insulins to the numbers listed below.  On this type of insulin schedule, you should eat meals on a regular schedule.  If a meal is missed or significantly delayed, your blood sugar could go low.    Please come back for a follow-up appointment in 2 months.

## 2017-07-09 ENCOUNTER — Other Ambulatory Visit: Payer: Self-pay

## 2017-07-09 DIAGNOSIS — K219 Gastro-esophageal reflux disease without esophagitis: Secondary | ICD-10-CM

## 2017-07-09 DIAGNOSIS — I10 Essential (primary) hypertension: Secondary | ICD-10-CM

## 2017-07-09 MED ORDER — LOSARTAN POTASSIUM 50 MG PO TABS: 50.0000 mg | ORAL_TABLET | Freq: Every day | ORAL | 0 refills | Status: DC

## 2017-07-09 MED ORDER — TRUEPLUS LANCETS 33G MISC: 1.0000 | Freq: Two times a day (BID) | 2 refills | Status: DC

## 2017-07-09 MED ORDER — TRUE METRIX METER W/DEVICE KIT: 1.0000 | PACK | Freq: Once | 0 refills | Status: AC

## 2017-07-09 MED ORDER — OMEPRAZOLE 20 MG PO CPDR: 20.0000 mg | DELAYED_RELEASE_CAPSULE | Freq: Every day | ORAL | 0 refills | Status: DC

## 2017-07-09 MED ORDER — BD SWAB SINGLE USE REGULAR PADS: 1.0000 | MEDICATED_PAD | Freq: Two times a day (BID) | 2 refills | Status: DC

## 2017-07-15 ENCOUNTER — Other Ambulatory Visit: Payer: Self-pay

## 2017-07-15 DIAGNOSIS — I10 Essential (primary) hypertension: Secondary | ICD-10-CM

## 2017-07-15 DIAGNOSIS — E7800 Pure hypercholesterolemia, unspecified: Secondary | ICD-10-CM

## 2017-07-15 MED ORDER — FUROSEMIDE 20 MG PO TABS: ORAL_TABLET | ORAL | 1 refills | Status: DC

## 2017-07-15 MED ORDER — AMLODIPINE BESYLATE 10 MG PO TABS: 10.0000 mg | ORAL_TABLET | Freq: Every day | ORAL | 1 refills | Status: DC

## 2017-07-15 MED ORDER — ATORVASTATIN CALCIUM 20 MG PO TABS: 20.0000 mg | ORAL_TABLET | Freq: Every day | ORAL | 1 refills | Status: DC

## 2017-07-21 ENCOUNTER — Encounter: Payer: Self-pay | Admitting: Internal Medicine

## 2017-07-21 ENCOUNTER — Ambulatory Visit (INDEPENDENT_AMBULATORY_CARE_PROVIDER_SITE_OTHER): Payer: Medicare HMO | Admitting: Internal Medicine

## 2017-07-21 VITALS — BP 148/88 | HR 98 | Temp 98.1°F | Wt 296.0 lb

## 2017-07-21 DIAGNOSIS — E118 Type 2 diabetes mellitus with unspecified complications: Secondary | ICD-10-CM | POA: Diagnosis not present

## 2017-07-21 DIAGNOSIS — E559 Vitamin D deficiency, unspecified: Secondary | ICD-10-CM | POA: Diagnosis not present

## 2017-07-21 DIAGNOSIS — M797 Fibromyalgia: Secondary | ICD-10-CM | POA: Diagnosis not present

## 2017-07-21 DIAGNOSIS — R202 Paresthesia of skin: Secondary | ICD-10-CM

## 2017-07-21 LAB — VITAMIN D 25 HYDROXY (VIT D DEFICIENCY, FRACTURES): VITD: 36.82 ng/mL (ref 30.00–100.00)

## 2017-07-21 LAB — COMPREHENSIVE METABOLIC PANEL
ALT: 13 U/L (ref 0–35)
AST: 17 U/L (ref 0–37)
Albumin: 3.9 g/dL (ref 3.5–5.2)
Alkaline Phosphatase: 126 U/L — ABNORMAL HIGH (ref 39–117)
BUN: 17 mg/dL (ref 6–23)
CO2: 29 mEq/L (ref 19–32)
Calcium: 9 mg/dL (ref 8.4–10.5)
Chloride: 104 mEq/L (ref 96–112)
Creatinine, Ser: 1.11 mg/dL (ref 0.40–1.20)
GFR: 61.73 mL/min (ref >60.00)
Glucose, Bld: 219 mg/dL — ABNORMAL HIGH (ref 70–99)
Potassium: 3.7 mEq/L (ref 3.5–5.1)
Sodium: 140 mEq/L (ref 135–145)
Total Bilirubin: 0.4 mg/dL (ref 0.2–1.2)
Total Protein: 7.3 g/dL (ref 6.0–8.3)

## 2017-07-21 LAB — TSH: TSH: 2.38 u[IU]/mL (ref 0.35–4.50)

## 2017-07-21 LAB — VITAMIN B12: Vitamin B-12: 176 pg/mL — ABNORMAL LOW (ref 211–911)

## 2017-07-21 NOTE — Progress Notes (Signed)
Subjective:    Patient ID: Bethany [name], female    DOB: April 26, 1945, 72 y.o.   MRN: [id number]  HPI  Pt presents to the clinic today with c/o a burning sensation of her skin and itching of her back. She is having multiple joint pain as well. This has been going on for years but worse in th last few months. She denies any rashes, joint swelling. She thinks the Omeprazole has something to do with it, but is not positive. She has a history of DM 2, but some neuropathic pain. [name] was 8.1%. She also has a history of fibromyalgia. She takes Aleve 1-2 times per day with some relief.  She is also due to have her Vit D levels repeated today.  Review of Systems  Past Medical History:  Diagnosis Date  . Arthritis   . Diabetes mellitus without complication ([facility])    Type II  . Fibromyalgia   . GERD (gastroesophageal reflux disease)    better after gall bladder surgery-   . Hyperlipidemia   . Hypertension   . Shortness of breath dyspnea    with exertion    Current Outpatient Medications  Medication Sig Dispense Refill  . Alcohol Swabs (B-D SINGLE USE SWABS REGULAR) PADS 1 each by Does not apply route 2 (two) times daily. 200 each 2  . amLODipine (NORVASC) 10 MG tablet Take 1 tablet (10 mg total) by mouth daily. 90 tablet 1  . aspirin 81 MG tablet Take 81 mg by mouth daily. Reported on 03/29/2015    . atorvastatin (LIPITOR) 20 MG tablet Take 1 tablet (20 mg total) by mouth daily. 90 tablet 1  . clotrimazole-betamethasone (LOTRISONE) cream Apply 1 application topically 3 (three) times daily. As needed for itching. 45 g 11  . docusate sodium (COLACE) 100 MG capsule Take 100 mg by mouth daily as needed for mild constipation. Reported on 03/29/2015    . furosemide (LASIX) 20 MG tablet TAKE ONE (1) TABLET BY MOUTH DAILY AS NEEDED 90 tablet 1  . glucose blood (ACCU-CHEK GUIDE) test strip Used to check blood sugars twice daily. 100 each 12  . insulin NPH Human (NOVOLIN N) 100 UNIT/ML  injection Inject 0.35 mLs (35 Units total) into the skin daily before breakfast. 20 mL 11  . insulin regular (NOVOLIN R RELION) 100 units/mL injection Inject 0.45 mLs (45 Units total) into the skin daily with breakfast. 10 mL 11  . losartan (COZAAR) 50 MG tablet Take 1 tablet (50 mg total) by mouth daily. 90 tablet 0  . naproxen sodium (ANAPROX) 220 MG tablet Take 220 mg by mouth as needed (for pain).     . Omega-3 Fatty Acids (FISH OIL PO) Take 2,400 mg by mouth daily.     Marland Kitchen omeprazole (PRILOSEC) 20 MG capsule Take 1 capsule (20 mg total) by mouth daily. 90 capsule 0  . TRUEPLUS LANCETS 33G MISC 1 each by Does not apply route 2 (two) times daily. 200 each 2   Current Facility-Administered Medications  Medication Dose Route Frequency Provider Last Rate Last Dose  . betamethasone acetate-betamethasone sodium phosphate (CELESTONE) injection 3 mg  3 mg Intramuscular Once Felecia Shelling, DPM        No Known Allergies  Family History  Problem Relation Age of Onset  . Arthritis Mother   . Cancer Mother        Ovarian  . Diabetes Mother   . Heart disease Father   . Hypertension Father  Social History   Socioeconomic History  . Marital status: Married    Spouse name: Not on file  . Number of children: Not on file  . Years of education: Not on file  . Highest education level: Not on file  Occupational History  . Not on file  Social Needs  . Financial resource strain: Not on file  . Food insecurity:    Worry: Not on file    Inability: Not on file  . Transportation needs:    Medical: Not on file    Non-medical: Not on file  Tobacco Use  . Smoking status: Never Smoker  . Smokeless tobacco: Never Used  Substance and Sexual Activity  . Alcohol use: No  . Drug use: No  . Sexual activity: Not Currently  Lifestyle  . Physical activity:    Days per week: Not on file    Minutes per session: Not on file  . Stress: Not on file  Relationships  . Social connections:    Talks on  phone: Not on file    Gets together: Not on file    Attends religious service: Not on file    Active member of club or organization: Not on file    Attends meetings of clubs or organizations: Not on file    Relationship status: Not on file  . Intimate partner violence:    Fear of current or ex partner: Not on file    Emotionally abused: Not on file    Physically abused: Not on file    Forced sexual activity: Not on file  Other Topics Concern  . Not on file  Social History Narrative  . Not on file     Constitutional: Denies fever, malaise, fatigue, headache or abrupt weight changes.  Skin: Pt reports burning sensation of skin, itching of back. Denies redness, rashes, lesions or ulcercations.  Neurological: Pt reports burning sensation of skin. Denies dizziness, difficulty with memory, difficulty with speech or problems with balance and coordination.    No other specific complaints in a complete review of systems (except as listed in HPI above).     Objective:   Physical Exam   BP (!) 148/88   Pulse 98   Temp 98.1 F (36.7 C) (Oral)   Wt 296 lb (134.3 kg)   SpO2 98%   BMI 46.36 kg/m  Wt Readings from Last 3 Encounters:  07/21/17 296 lb (134.3 kg)  06/02/17 296 lb 6.4 oz (134.4 kg)  04/01/17 291 lb 3.2 oz (132.1 kg)    General: Appears her stated age, obese in NAD. Skin: Warm, dry and intact. No rashes noted. Neurological: Alert and oriented. Cranial nerves Sensation intact to BUE/BLE.   BMET    Component Value Date/Time   NA 138 03/20/2017 1136   K 3.9 03/20/2017 1136   CL 102 03/20/2017 1136   CO2 28 03/20/2017 1136   GLUCOSE 232 (H) 03/20/2017 1136   BUN 14 03/20/2017 1136   CREATININE 0.97 03/20/2017 1136   CALCIUM 9.4 03/20/2017 1136   GFRNONAA 55 (L) 10/20/2014 1039   GFRAA >60 10/20/2014 1039    Lipid Panel     Component Value Date/Time   CHOL 118 03/20/2017 1136   TRIG 104.0 03/20/2017 1136   HDL 51.80 03/20/2017 1136   CHOLHDL 2 03/20/2017  1136   VLDL 20.8 03/20/2017 1136   LDLCALC 45 03/20/2017 1136    CBC    Component Value Date/Time   WBC 6.9 03/20/2017 1136   RBC  4.79 03/20/2017 1136   HGB 12.7 03/20/2017 1136   HCT 38.8 03/20/2017 1136   PLT 303.0 03/20/2017 1136   MCV 81.1 03/20/2017 1136   MCH 25.6 (L) 10/20/2014 1039   MCHC 32.7 03/20/2017 1136   RDW 15.4 03/20/2017 1136   LYMPHSABS 2.2 09/06/2014 1048   MONOABS 0.5 09/06/2014 1048   EOSABS 0.3 09/06/2014 1048   BASOSABS 0.0 09/06/2014 1048    Hgb A1C Lab Results  Component Value Date   HGBA1C 8.1 (A) 06/02/2017           Assessment & Plan:   Vit D Deficiency:  Repeat Vit D today  Paresthesias of BUE/BLE:  ? R/t fibromyalgia vs DM 2 CMET, TSH, B12, and Vit D today Discussed treating with Cymbalta vs Gabapentin, pending labs  Will follow up after labs are back Nicki Reaper, NP

## 2017-07-21 NOTE — Patient Instructions (Signed)
Vitamin D Deficiency Vitamin D deficiency is when your body does not have enough vitamin D. Vitamin D is important because:  It helps your body use other minerals that your body needs.  It helps keep your bones strong and healthy.  It may help to prevent some diseases.  It helps your heart and other muscles work well.  You can get vitamin D by:  Eating foods with vitamin D in them.  Drinking or eating milk or other foods that have had vitamin D added to them.  Taking a vitamin D supplement.  Being in the sun.  Not getting enough vitamin D can make your bones become soft. It can also cause other health problems. Follow these instructions at home:  Take medicines and supplements only as told by your doctor.  Eat foods that have vitamin D. These include: ? Dairy products, cereals, or juices with added vitamin D. Check the label for vitamin D. ? Fatty fish like salmon or trout. ? Eggs. ? Oysters.  Do not use tanning beds.  Stay at a healthy weight. Lose weight, if needed.  Keep all follow-up visits as told by your doctor. This is important. Contact a doctor if:  Your symptoms do not go away.  You feel sick to your stomach (nauseous).  Youthrow up (vomit).  You poop less often than usual or you have trouble pooping (constipation). This information is not intended to replace advice given to you by your health care provider. Make sure you discuss any questions you have with your health care provider. Document Released: 12/20/2010 Document Revised: 06/08/2015 Document Reviewed: 05/18/2014 Elsevier Interactive Patient Education  2018 [employer]  

## 2017-07-30 ENCOUNTER — Encounter (INDEPENDENT_AMBULATORY_CARE_PROVIDER_SITE_OTHER): Payer: Self-pay

## 2017-07-30 ENCOUNTER — Ambulatory Visit (INDEPENDENT_AMBULATORY_CARE_PROVIDER_SITE_OTHER): Payer: Medicare HMO

## 2017-07-30 DIAGNOSIS — E538 Deficiency of other specified B group vitamins: Secondary | ICD-10-CM

## 2017-07-30 MED ORDER — CYANOCOBALAMIN 1000 MCG/ML IJ SOLN
1000.0000 ug | Freq: Once | INTRAMUSCULAR | Status: AC
  Administered 2017-07-30: 1000 ug via INTRAMUSCULAR

## 2017-07-30 NOTE — Progress Notes (Signed)
Per orders of Regina Baity, NP-C, injection of Vitamin B12 given by Alix Lahmann Y. Patient tolerated injection well. 

## 2017-08-12 ENCOUNTER — Ambulatory Visit: Payer: Medicare HMO | Admitting: Endocrinology

## 2017-09-02 ENCOUNTER — Telehealth: Payer: Self-pay | Admitting: Internal Medicine

## 2017-09-02 ENCOUNTER — Ambulatory Visit (INDEPENDENT_AMBULATORY_CARE_PROVIDER_SITE_OTHER): Payer: Medicare HMO | Admitting: *Deleted

## 2017-09-02 DIAGNOSIS — E538 Deficiency of other specified B group vitamins: Secondary | ICD-10-CM | POA: Diagnosis not present

## 2017-09-02 MED ORDER — CYANOCOBALAMIN 1000 MCG/ML IJ SOLN
1000.0000 ug | Freq: Once | INTRAMUSCULAR | Status: AC
  Administered 2017-09-02: 1000 ug via INTRAMUSCULAR

## 2017-09-02 NOTE — Telephone Encounter (Signed)
See note below.  I don't think this is Dr. Lianne Bushyuncan's patient.  Patient must have mixed up the names Laurette Schimke(Nan Maya and AugustRegina).

## 2017-09-02 NOTE — Telephone Encounter (Signed)
Best number [phone] Pt came in today wanting lugene to call her.   Regarding a stair lift.  She spoke with insurance company and they will pay 80% if dr approves this.  She stated the insurance company said dr needs to call them.

## 2017-09-02 NOTE — Progress Notes (Signed)
Per orders of Regina Baity, NP, injection of B12 1000 mcg/mL given by Remingtyn Depaola. Patient tolerated injection well.  

## 2017-09-03 NOTE — Telephone Encounter (Signed)
She has to have a face to face visit for this.

## 2017-09-10 ENCOUNTER — Other Ambulatory Visit: Payer: Self-pay | Admitting: Endocrinology

## 2017-09-10 ENCOUNTER — Ambulatory Visit: Payer: Medicare HMO | Admitting: Endocrinology

## 2017-09-10 ENCOUNTER — Encounter: Payer: Self-pay | Admitting: Endocrinology

## 2017-09-10 VITALS — BP 126/68 | HR 95 | Ht 67.0 in | Wt 303.0 lb

## 2017-09-10 DIAGNOSIS — E1165 Type 2 diabetes mellitus with hyperglycemia: Secondary | ICD-10-CM

## 2017-09-10 DIAGNOSIS — E118 Type 2 diabetes mellitus with unspecified complications: Secondary | ICD-10-CM | POA: Diagnosis not present

## 2017-09-10 DIAGNOSIS — IMO0002 Reserved for concepts with insufficient information to code with codable children: Secondary | ICD-10-CM

## 2017-09-10 LAB — POCT GLYCOSYLATED HEMOGLOBIN (HGB A1C): Hemoglobin A1C: 8.4 % — AB (ref 4.0–5.6)

## 2017-09-10 MED ORDER — INSULIN REGULAR HUMAN 100 UNIT/ML IJ SOLN: 55.0000 [IU] | Freq: Every day | INTRAMUSCULAR | 11 refills | Status: DC

## 2017-09-10 MED ORDER — INSULIN NPH (HUMAN) (ISOPHANE) 100 UNIT/ML ~~LOC~~ SUSP: 30.0000 [IU] | Freq: Every day | SUBCUTANEOUS | 11 refills | Status: DC

## 2017-09-10 NOTE — Progress Notes (Signed)
Subjective:    Patient ID: Bethany [name], female    DOB: [birth year], 72 y.o.   MRN: [id number]  HPI Pt returns for f/u of diabetes mellitus: DM type: Insulin-requiring type 2 Dx'ed: 1998.  Complications: polyneuropathy.  Therapy: insulin since 2009.  GDM: never DKA: never Severe hypoglycemia: never. Pancreatitis: never. Other: she takes 2 QD insulins, after poor results with multiple daily injections, but she often eats just 2 meals per day; she takes human insulin, due to cost.  Interval history: She says she never misses her insulin.  no cbg record, but states cbg varies from 65-300's.  It is lowest at HS, and highest at lunch.   Past Medical History:  Diagnosis Date  . Arthritis   . Diabetes mellitus without complication (HCC)    Type II  . Fibromyalgia   . GERD (gastroesophageal reflux disease)    better after gall bladder surgery-   . Hyperlipidemia   . Hypertension   . Shortness of breath dyspnea    with exertion    Past Surgical History:  Procedure Laterality Date  . ABDOMINAL HYSTERECTOMY  1997  . CATARACT EXTRACTION Bilateral 2014  . CHOLECYSTECTOMY  2010  . COLONOSCOPY    . COLONOSCOPY WITH PROPOFOL N/A 04/21/2015   Procedure: COLONOSCOPY WITH PROPOFOL;  Surgeon: Wallace CullensPaul Y Oh, MD;  [facility];  Service: Gastroenterology;  Laterality: N/A;  . EYE SURGERY Bilateral    Cataract  . LUMBAR WOUND DEBRIDEMENT N/A 10/20/2014   Procedure: LUMBAR WOUND DEBRIDEMENT;  Surgeon: Tia Alertavid S Jones, MD;  Location: [facility] NEURO ORS;  Service: Neurosurgery;  Laterality: N/A;  . MAXIMUM ACCESS (MAS)POSTERIOR LUMBAR INTERBODY FUSION (PLIF) 1 LEVEL N/A 09/16/2014   Procedure: L/4-5 FOR MAXIMUM ACCESS (MAS) POSTERIOR LUMBAR INTERBODY FUSION (PLIF) 1 LEVEL;  Surgeon: Tia Alertavid S Jones, MD;  Location: [facility]UR[facility];  Service: Neurosurgery;  Laterality: N/A;  L/4-5 FOR MAXIMUM ACCESS (MAS) POSTERIOR LUMBAR INTERBODY FUSION (PLIF) 1 LEVEL    Social History   Socioeconomic History  .  Marital status: Married    Spouse name: Not on file  . Number of children: Not on file  . Years of education: Not on file  . Highest education level: Not on file  Occupational History  . Not on file  Social Needs  . Financial resource strain: Not on file  . Food insecurity:    Worry: Not on file    Inability: Not on file  . Transportation needs:    Medical: Not on file    Non-medical: Not on file  Tobacco Use  . Smoking status: Never Smoker  . Smokeless tobacco: Never Used  Substance and Sexual Activity  . Alcohol use: No  . Drug use: No  . Sexual activity: Not Currently  Lifestyle  . Physical activity:    Days per week: Not on file    Minutes per session: Not on file  . Stress: Not on file  Relationships  . Social connections:    Talks on phone: Not on file    Gets together: Not on file    Attends religious service: Not on file    Active member of club or organization: Not on file    Attends meetings of clubs or organizations: Not on file    Relationship status: Not on file  . Intimate partner violence:    Fear of current or ex partner: Not on file    Emotionally abused: Not on file    Physically abused: Not on file  Forced sexual activity: Not on file  Other Topics Concern  . Not on file  Social History Narrative  . Not on file    Current Outpatient Medications on File Prior to Visit  Medication Sig Dispense Refill  . Alcohol Swabs (B-D SINGLE USE SWABS REGULAR) PADS 1 each by Does not apply route 2 (two) times daily. 200 each 2  . amLODipine (NORVASC) 10 MG tablet Take 1 tablet (10 mg total) by mouth daily. 90 tablet 1  . aspirin 81 MG tablet Take 81 mg by mouth daily. Reported on 03/29/2015    . atorvastatin (LIPITOR) 20 MG tablet Take 1 tablet (20 mg total) by mouth daily. 90 tablet 1  . clotrimazole-betamethasone (LOTRISONE) cream Apply 1 application topically 3 (three) times daily. As needed for itching. 45 g 11  . docusate sodium (COLACE) 100 MG capsule  Take 100 mg by mouth daily as needed for mild constipation. Reported on 03/29/2015    . furosemide (LASIX) 20 MG tablet TAKE ONE (1) TABLET BY MOUTH DAILY AS NEEDED 90 tablet 1  . glucose blood (ACCU-CHEK GUIDE) test strip Used to check blood sugars twice daily. 100 each 12  . naproxen sodium (ANAPROX) 220 MG tablet Take 220 mg by mouth as needed (for pain).     . Omega-3 Fatty Acids (FISH OIL PO) Take 2,400 mg by mouth daily.     . TRUEPLUS LANCETS 33G MISC 1 each by Does not apply route 2 (two) times daily. 200 each 2   Current Facility-Administered Medications on File Prior to Visit  Medication Dose Route Frequency Provider Last Rate Last Dose  . betamethasone acetate-betamethasone sodium phosphate (CELESTONE) injection 3 mg  3 mg Intramuscular Once Felecia Shelling, DPM        No Known Allergies  Family History  Problem Relation Age of Onset  . Arthritis Mother   . Cancer Mother        Ovarian  . Diabetes Mother   . Heart disease Father   . Hypertension Father     BP 126/68 (BP Location: Right Arm, Patient Position: Sitting, Cuff Size: Large)   Pulse 95   Ht 5\' 7"  (1.702 m)   Wt (!) 303 lb (137.4 kg)   SpO2 97%   BMI 47.46 kg/m    Review of Systems She has gained weight    Objective:   Physical Exam VITAL SIGNS:  See vs page.   GENERAL: no distress.  Morbid obesity.   Pulses: foot pulses are intact bilaterally.   MSK: no deformity of the feet or ankles.  CV: 1+ bilat edema of the legs Skin:  no ulcer on the feet or ankles.  normal temp on the feet and ankles.   Neuro: sensation is intact to touch on the feet and ankles, but decreased from normal.   Ext: There is bilateral onychomycosis of the toenails.    Lab Results  Component Value Date   HGBA1C 8.4 (A) 09/10/2017      Assessment & Plan:  Insulin-requiring type 2 DM: he needs increased rx Hypoglycemia: Based on the pattern of her cbg's, she needs some adjustment in her insulins.   Patient Instructions    check your blood sugar twice a day.  vary the time of day when you check, between before the 3 meals, and at bedtime.  also check if you have symptoms of your blood sugar being too high or too low.  please keep a record of the readings and bring it  to your next appointment here (or you can bring the meter itself).  You can write it on any piece of paper.  please call us sooner if your blood sugar goes below 70, or if you have a lot of readings over 200.  Please change the insulins to the numbers listed below.  On this type of insulin schedule, you should eat meals on a regular schedule.  If a meal is missed or significantly delayed, your blood sugar could go low.   Please come back for a follow-up appointment in 2 months.

## 2017-09-10 NOTE — Patient Instructions (Addendum)
check your blood sugar twice a day.  vary the time of day when you check, between before the 3 meals, and at bedtime.  also check if you have symptoms of your blood sugar being too high or too low.  please keep a record of the readings and bring it to your next appointment here (or you can bring the meter itself).  You can write it on any piece of paper.  please call us sooner if your blood sugar goes below 70, or if you have a lot of readings over 200.   Please change the insulins to the numbers listed below.   On this type of insulin schedule, you should eat meals on a regular schedule.  If a meal is missed or significantly delayed, your blood sugar could go low.   Please come back for a follow-up appointment in 2 months.   

## 2017-09-11 ENCOUNTER — Other Ambulatory Visit: Payer: Self-pay | Admitting: Internal Medicine

## 2017-09-11 DIAGNOSIS — I10 Essential (primary) hypertension: Secondary | ICD-10-CM

## 2017-09-11 DIAGNOSIS — K219 Gastro-esophageal reflux disease without esophagitis: Secondary | ICD-10-CM

## 2017-10-06 ENCOUNTER — Encounter: Payer: Self-pay | Admitting: Internal Medicine

## 2017-10-06 ENCOUNTER — Ambulatory Visit (INDEPENDENT_AMBULATORY_CARE_PROVIDER_SITE_OTHER): Payer: Medicare HMO | Admitting: Internal Medicine

## 2017-10-06 VITALS — BP 152/94 | HR 92 | Temp 98.4°F | Wt 306.5 lb

## 2017-10-06 DIAGNOSIS — M25561 Pain in right knee: Secondary | ICD-10-CM

## 2017-10-06 DIAGNOSIS — E538 Deficiency of other specified B group vitamins: Secondary | ICD-10-CM | POA: Diagnosis not present

## 2017-10-06 DIAGNOSIS — M797 Fibromyalgia: Secondary | ICD-10-CM | POA: Diagnosis not present

## 2017-10-06 DIAGNOSIS — R0609 Other forms of dyspnea: Secondary | ICD-10-CM

## 2017-10-06 DIAGNOSIS — E1142 Type 2 diabetes mellitus with diabetic polyneuropathy: Secondary | ICD-10-CM

## 2017-10-06 DIAGNOSIS — R0600 Dyspnea, unspecified: Secondary | ICD-10-CM

## 2017-10-06 DIAGNOSIS — G8929 Other chronic pain: Secondary | ICD-10-CM

## 2017-10-06 DIAGNOSIS — R269 Unspecified abnormalities of gait and mobility: Secondary | ICD-10-CM

## 2017-10-06 DIAGNOSIS — M25562 Pain in left knee: Secondary | ICD-10-CM

## 2017-10-06 DIAGNOSIS — I10 Essential (primary) hypertension: Secondary | ICD-10-CM

## 2017-10-06 DIAGNOSIS — R011 Cardiac murmur, unspecified: Secondary | ICD-10-CM

## 2017-10-06 DIAGNOSIS — M545 Low back pain, unspecified: Secondary | ICD-10-CM

## 2017-10-06 MED ORDER — MELOXICAM 15 MG PO TABS: 15.0000 mg | ORAL_TABLET | Freq: Every day | ORAL | 0 refills | Status: DC

## 2017-10-06 MED ORDER — CYANOCOBALAMIN 1000 MCG/ML IJ SOLN
1000.0000 ug | Freq: Once | INTRAMUSCULAR | Status: AC
  Administered 2017-10-06: 1000 ug via INTRAMUSCULAR

## 2017-10-06 MED ORDER — LOSARTAN POTASSIUM 100 MG PO TABS: 100.0000 mg | ORAL_TABLET | Freq: Every day | ORAL | 3 refills | Status: DC

## 2017-10-06 NOTE — Patient Instructions (Signed)
Heart Murmur A heart murmur is an extra sound that is caused by chaotic blood flow. The murmur can be heard as a "hum" or "whoosh" sound when blood flows through the heart. The heart has four areas called chambers. Valves separate the upper and lower chambers from each other (tricuspid valve and mitral valve) and separate the lower chambers of the heart from pathways that lead away from the heart (aortic valve and pulmonary valve). Normally, the valves open to let blood flow through or out of your heart, and then they shut to keep the blood from flowing backward. There are two types of heart murmurs:  Innocent murmurs. Most people with this type of heart murmur do not have a heart problem. Many children have innocent heart murmurs. Your health care provider may suggest some basic testing to find out whether your murmur is an innocent murmur. If an innocent heart murmur is found, there is no need for further tests or treatment and no need to restrict activities or stop playing sports.  Abnormal murmurs. These types of murmurs can occur in children and adults. Abnormal murmurs may be a sign of a more serious heart condition, such as a heart defect present at birth (congenital defect) or heart valve disease.  What are the causes? This condition is caused by heart valves that are not working properly. In children, abnormal heart murmurs are typically caused by congenital defects. In adults, abnormal murmurs are usually from heart valve problems caused by disease, infection, or aging. Three types of heart valve defects can cause a murmur:  Regurgitation. This is when blood leaks back through the valve in the wrong direction.  Mitral valve prolapse. This is when the mitral valve of the heart has a loose flap and does not close tightly.  Stenosis. This is when a valve does not open enough and blocks blood flow.  This condition may also be caused by:  Pregnancy.  Fever.  Overactive thyroid  gland.  Anemia.  Exercise.  Rapid growth spurts (in children).  What are the signs or symptoms? Innocent murmurs do not cause symptoms, and many people with abnormal murmurs may or may not have symptoms. If symptoms do develop, they may include:  Shortness of breath.  Blue coloring of the skin, especially on the fingertips.  Chest pain.  Palpitations, or feeling a fluttering or skipped heartbeat.  Fainting.  Persistent cough.  Getting tired much faster than expected.  Swelling in the abdomen, feet, or ankles.  How is this diagnosed? This condition may be diagnosed during a routine physical or other exam. If your health care provider hears a murmur with a stethoscope, he or she will listen for:  Where the murmur is located in your heart.  How long the murmur lasts (duration).  When the murmur is heard during the heartbeat.  How loud the murmur is. This may help the health care provider figure out what is causing the murmur.  You may be referred to a heart specialist (cardiologist). You may also have other tests, including:  Electrocardiogram (ECG or EKG). This test measures the electrical activity of your heart.  Echocardiogram. This test uses high frequency sound waves to make pictures of your heart.  MRI or chest X-ray.  Cardiac catheterization. This test looks at blood flow through the heart.  For children and adults who have an abnormal heart murmur and want to stay active, it is important to complete testing, review test results, and receive recommendations from your health care   provider. If heart disease is present, it may not be safe to play or be active. How is this treated? Heart murmurs themselves do not need treatment. In some cases, a heart murmur may go away on its own. If an underlying problem or disease is causing the murmur, you may need treatment. If treatment is needed, it will depend on the type and severity of the disease or heart problem causing  the murmur. Treatment may include:  Medicine.  Surgery.  Dietary and lifestyle changes.  Follow these instructions at home:  Talk with your health care provider before participating in sports or other activities that require a lot of effort and energy (are strenuous).  Learn as much as possible about your condition and any related diseases. Ask your health care provider if you may at risk for any medical emergencies.  Talk with your health care provider about what symptoms you should look out for.  It is up to you to get your test results. Ask your health care provider, or the department that is doing the test, when your results will be ready.  Keep all follow-up visits as told by your health care provider. This is important. Contact a health care provider if:  You feel light-headed.  You are frequently short of breath.  You feel more tired than usual.  You are having a hard time keeping up with normal activities or fitness routines.  You have swelling in your ankles or feet.  You have chest pain.  You notice that your heart often beats irregularly.  You develop any new symptoms. Get help right away if:  You develop severe chest pain.  You are having trouble breathing.  You have fainting spells.  Your symptoms suddenly get worse. These symptoms may represent a serious problem that is an emergency. Do not wait to see if the symptoms will go away. Get medical help right away. Call your local emergency services (911 in the [place] Do not drive yourself to the hospital. Summary  Normally, the heart valves open to let blood flow through or out of your heart, and then they shut to keep the blood from flowing backward.  Heart murmur is caused by heart valves that are not working properly.  You may need treatment if an underlying problem or disease is causing the heart murmur. Treatment may include medicine, surgery, or dietary and lifestyle changes.  Talk with your  health care provider before participating in sports or other activities that require a lot of effort and energy (are strenuous).  Talk with your health care provider about what symptoms you should watch out for. This information is not intended to replace advice given to you by your health care provider. Make sure you discuss any questions you have with your health care provider. Document Released: 02/08/2004 Document Revised: 12/20/2015 Document Reviewed: 12/20/2015 Elsevier Interactive Patient Education  2018 [employer]  

## 2017-10-06 NOTE — Progress Notes (Signed)
Subjective:    Patient ID: Bethany [name], female    DOB: 07/19/1945, 72 y.o.   MRN: [id number]  HPI  Pt presents to the clinic today for face to face encounter for a chair lift. She has known obesity and continues to gain weight, fibromyalgia, chronic back pain, chronic knee pain, DM 2 with neuropathy. She is moving in with her daughter and will be living in the basement. She is unable to walk up the stairs for meals.  She currently walks with a cane.  Of note, her BP today is 152/94. She is taking Amlodipine, Lasix and Losartan as prescribed. ECG from 09/2014 reviewed.  She also reports dyspnea on exertion. She denies shortness of breath at rest, chest pain or chest tightness. She does have edema in her lower legs, taking Lasix daily. She has no prior history of COPD, CHF, Afib.   Review of Systems  Past Medical History:  Diagnosis Date  . Arthritis   . Diabetes mellitus without complication (HCC)    Type II  . Fibromyalgia   . GERD (gastroesophageal reflux disease)    better after gall bladder surgery-   . Hyperlipidemia   . Hypertension   . Shortness of breath dyspnea    with exertion    Current Outpatient Medications  Medication Sig Dispense Refill  . Alcohol Swabs (B-D SINGLE USE SWABS REGULAR) PADS 1 each by Does not apply route 2 (two) times daily. 200 each 2  . amLODipine (NORVASC) 10 MG tablet Take 1 tablet (10 mg total) by mouth daily. 90 tablet 1  . aspirin 81 MG tablet Take 81 mg by mouth daily. Reported on 03/29/2015    . atorvastatin (LIPITOR) 20 MG tablet Take 1 tablet (20 mg total) by mouth daily. 90 tablet 1  . clotrimazole-betamethasone (LOTRISONE) cream Apply 1 application topically 3 (three) times daily. As needed for itching. 45 g 11  . docusate sodium (COLACE) 100 MG capsule Take 100 mg by mouth daily as needed for mild constipation. Reported on 03/29/2015    . furosemide (LASIX) 20 MG tablet TAKE ONE (1) TABLET BY MOUTH DAILY AS NEEDED 90 tablet 1  .  glucose blood (ACCU-CHEK GUIDE) test strip Used to check blood sugars twice daily. 100 each 12  . insulin NPH Human (NOVOLIN N) 100 UNIT/ML injection Inject 0.3 mLs (30 Units total) into the skin daily before breakfast. 20 mL 11  . insulin regular (NOVOLIN R RELION) 100 units/mL injection Inject 0.55 mLs (55 Units total) into the skin daily with breakfast. 20 mL 11  . losartan (COZAAR) 50 MG tablet TAKE 1 TABLET EVERY DAY 90 tablet 0  . naproxen sodium (ANAPROX) 220 MG tablet Take 220 mg by mouth as needed (for pain).     . Omega-3 Fatty Acids (FISH OIL PO) Take 2,400 mg by mouth daily.     Marland Kitchen omeprazole (PRILOSEC) 20 MG capsule TAKE 1 CAPSULE EVERY DAY 90 capsule 0  . TRUEPLUS LANCETS 33G MISC 1 each by Does not apply route 2 (two) times daily. 200 each 2   Current Facility-Administered Medications  Medication Dose Route Frequency Provider Last Rate Last Dose  . betamethasone acetate-betamethasone sodium phosphate (CELESTONE) injection 3 mg  3 mg Intramuscular Once Felecia Shelling, DPM        No Known Allergies  Family History  Problem Relation Age of Onset  . Arthritis Mother   . Cancer Mother        Ovarian  . Diabetes Mother   .  Heart disease Father   . Hypertension Father     Social History   Socioeconomic History  . Marital status: Married    Spouse name: Not on file  . Number of children: Not on file  . Years of education: Not on file  . Highest education level: Not on file  Occupational History  . Not on file  Social Needs  . Financial resource strain: Not on file  . Food insecurity:    Worry: Not on file    Inability: Not on file  . Transportation needs:    Medical: Not on file    Non-medical: Not on file  Tobacco Use  . Smoking status: Never Smoker  . Smokeless tobacco: Never Used  Substance and Sexual Activity  . Alcohol use: No  . Drug use: No  . Sexual activity: Not Currently  Lifestyle  . Physical activity:    Days per week: Not on file    Minutes per  session: Not on file  . Stress: Not on file  Relationships  . Social connections:    Talks on phone: Not on file    Gets together: Not on file    Attends religious service: Not on file    Active member of club or organization: Not on file    Attends meetings of clubs or organizations: Not on file    Relationship status: Not on file  . Intimate partner violence:    Fear of current or ex partner: Not on file    Emotionally abused: Not on file    Physically abused: Not on file    Forced sexual activity: Not on file  Other Topics Concern  . Not on file  Social History Narrative  . Not on file     Constitutional: Pt reports weight gain. Denies fever, malaise, fatigue, headache.  HEENT: Denies eye pain, eye redness, ear pain, ringing in the ears, wax buildup, runny nose, nasal congestion, bloody nose, or sore throat. Respiratory: Pt reports dyspnea on exertion. Denies difficulty breathing, shortness of breath, cough or sputum production.   Cardiovascular: Pt reports swelling in legs. Denies chest pain, chest tightness, palpitations or swelling in the hands.  Musculoskeletal: Pt reports chronic joint and muscle pain, difficulty with gait. Denies decrease in range of motion, or joint swelling.  Neurological: Pt reports numbness and tingling in BLE, difficulty with balance and coordination. Denies dizziness, difficulty with memory, difficulty with speech.    No other specific complaints in a complete review of systems (except as listed in HPI above).     Objective:   Physical Exam   BP (!) 152/94   Pulse 92   Temp 98.4 F (36.9 C) (Oral)   Wt (!) 306 lb 8 oz (139 kg)   SpO2 98%   BMI 48.00 kg/m  Wt Readings from Last 3 Encounters:  10/06/17 (!) 306 lb 8 oz (139 kg)  09/10/17 (!) 303 lb (137.4 kg)  07/21/17 296 lb (134.3 kg)    General: Appears her stated age, obese in NAD. HEENT: Throat/Mouth: Teeth present, mucosa pink and moist, no exudate, lesions or ulcerations noted.    Cardiovascular: Normal rate and rhythm. S1,S2 noted.  Murmur noted. 1+ non pitting BLE edema.  Pulmonary/Chest: Normal effort and positive vesicular breath sounds. No respiratory distress. No wheezes, rales or ronchi noted.  Musculoskeletal: Limited flexion of the spine. Normal extension and rotation. Bony tenderness noted over the lumbar spine. Crepitus noted with flexion and extension of bilateral knees. Joints enlarged  without swelling. Gait slow but steady with use of cane. Unable to walk on heels or toes. Neurological: Alert and oriented. Sensation slightly decreased to BLE.   BMET    Component Value Date/Time   NA 140 07/21/2017 1208   K 3.7 07/21/2017 1208   CL 104 07/21/2017 1208   CO2 29 07/21/2017 1208   GLUCOSE 219 (H) 07/21/2017 1208   BUN 17 07/21/2017 1208   CREATININE 1.11 07/21/2017 1208   CALCIUM 9.0 07/21/2017 1208   GFRNONAA 55 (L) 10/20/2014 1039   GFRAA >60 10/20/2014 1039    Lipid Panel     Component Value Date/Time   CHOL 118 03/20/2017 1136   TRIG 104.0 03/20/2017 1136   HDL 51.80 03/20/2017 1136   CHOLHDL 2 03/20/2017 1136   VLDL 20.8 03/20/2017 1136   LDLCALC 45 03/20/2017 1136    CBC    Component Value Date/Time   WBC 6.9 03/20/2017 1136   RBC 4.79 03/20/2017 1136   HGB 12.7 03/20/2017 1136   HCT 38.8 03/20/2017 1136   PLT 303.0 03/20/2017 1136   MCV 81.1 03/20/2017 1136   MCH 25.6 (L) 10/20/2014 1039   MCHC 32.7 03/20/2017 1136   RDW 15.4 03/20/2017 1136   LYMPHSABS 2.2 09/06/2014 1048   MONOABS 0.5 09/06/2014 1048   EOSABS 0.3 09/06/2014 1048   BASOSABS 0.0 09/06/2014 1048    Hgb A1C Lab Results  Component Value Date   HGBA1C 8.4 (A) 09/10/2017           Assessment & Plan:   Gait Disturbance, Chronic Knee Pain, Chronic Back Pain, Fibromyalgia, Obesity, DM 2 with Neuropathy:  Start Meloxicam 15 mg tabs daily, avoid OTC NSAID's RX provided for automatic stair chair lift She will process this through her insurance  company  HTN:  Repeat BP 144/82 Uncontrolled Reinforced DASH diet and exercise for weight loss Continue Amlodipine and Lasix Increase Losartan to 100 mg daily  Heart Murmur, DOE:  2D echo ordered  RTC in 1 month for follow up HTN Nicki Reaper, NP

## 2017-10-13 ENCOUNTER — Telehealth: Payer: Self-pay | Admitting: Internal Medicine

## 2017-10-13 NOTE — Telephone Encounter (Signed)
Copied from CRM [id number]. Topic: Quick Communication - See Telephone Encounter >> Oct 13, 2017 10:36 AM Jolayne Haines L wrote: CRM for notification. See Telephone encounter for: 10/13/17.  Destiny with [facility] Authorization department called and would like Shirlee Limerick to give her a call back in regards to the echo cardiogram that was ordered by Nicki Reaper. She can be reached @ [phone] no extension. Tracking Number [id number]

## 2017-10-14 NOTE — Telephone Encounter (Signed)
Called [employer] back and received Authorization for 2D Echo.

## 2017-10-23 ENCOUNTER — Ambulatory Visit (INDEPENDENT_AMBULATORY_CARE_PROVIDER_SITE_OTHER): Payer: Medicare HMO

## 2017-10-23 ENCOUNTER — Other Ambulatory Visit: Payer: Self-pay

## 2017-10-23 DIAGNOSIS — R0609 Other forms of dyspnea: Secondary | ICD-10-CM | POA: Diagnosis not present

## 2017-10-23 DIAGNOSIS — R011 Cardiac murmur, unspecified: Secondary | ICD-10-CM | POA: Diagnosis not present

## 2017-10-23 DIAGNOSIS — R0600 Dyspnea, unspecified: Secondary | ICD-10-CM

## 2017-10-24 ENCOUNTER — Other Ambulatory Visit: Payer: Self-pay | Admitting: Internal Medicine

## 2017-10-24 MED ORDER — METOPROLOL SUCCINATE 25 MG PO CS24: 25.0000 mg | EXTENDED_RELEASE_CAPSULE | Freq: Every day | ORAL | 3 refills | Status: DC

## 2017-10-27 MED ORDER — METOPROLOL SUCCINATE ER 25 MG PO TB24: 25.0000 mg | ORAL_TABLET | Freq: Every day | ORAL | 2 refills | Status: DC

## 2017-10-27 MED ORDER — METOPROLOL SUCCINATE 25 MG PO CS24: 25.0000 mg | EXTENDED_RELEASE_CAPSULE | Freq: Every day | ORAL | 3 refills | Status: DC

## 2017-10-27 MED ORDER — METOPROLOL SUCCINATE ER 25 MG PO TB24: 12.5000 mg | ORAL_TABLET | Freq: Every day | ORAL | 2 refills | Status: DC

## 2017-10-27 NOTE — Addendum Note (Signed)
Addended by: Roena Malady on: 10/27/2017 01:43 PM   Modules accepted: Orders

## 2017-10-27 NOTE — Addendum Note (Signed)
Addended by: Roena Malady on: 10/27/2017 01:41 PM   Modules accepted: Orders

## 2017-11-03 ENCOUNTER — Ambulatory Visit: Payer: Medicare HMO | Admitting: Internal Medicine

## 2017-11-03 DIAGNOSIS — Z0289 Encounter for other administrative examinations: Secondary | ICD-10-CM

## 2017-11-10 ENCOUNTER — Encounter: Payer: Self-pay | Admitting: Endocrinology

## 2017-11-10 ENCOUNTER — Ambulatory Visit (INDEPENDENT_AMBULATORY_CARE_PROVIDER_SITE_OTHER): Payer: Medicare HMO | Admitting: Endocrinology

## 2017-11-10 VITALS — BP 128/72 | HR 83 | Ht 67.0 in | Wt 298.0 lb

## 2017-11-10 DIAGNOSIS — E1165 Type 2 diabetes mellitus with hyperglycemia: Secondary | ICD-10-CM

## 2017-11-10 DIAGNOSIS — Z794 Long term (current) use of insulin: Secondary | ICD-10-CM

## 2017-11-10 DIAGNOSIS — E118 Type 2 diabetes mellitus with unspecified complications: Secondary | ICD-10-CM | POA: Diagnosis not present

## 2017-11-10 DIAGNOSIS — IMO0002 Reserved for concepts with insufficient information to code with codable children: Secondary | ICD-10-CM

## 2017-11-10 LAB — POCT GLYCOSYLATED HEMOGLOBIN (HGB A1C): Hemoglobin A1C: 9.1 % — AB (ref 4.0–5.6)

## 2017-11-10 NOTE — Patient Instructions (Addendum)
Please continue the same insulins.  Please take both of these right before breakfast. On this type of insulin schedule, you should eat meals on a regular schedule.  If a meal is missed or significantly delayed, your blood sugar could go low.   check your blood sugar twice a day.  vary the time of day when you check, between before the 3 meals, and at bedtime.  also check if you have symptoms of your blood sugar being too high or too low.  please keep a record of the readings and bring it to your next appointment here (or you can bring the meter itself).  You can write it on any piece of paper.  please call us sooner if your blood sugar goes below 70, or if you have a lot of readings over 200.   Please come back for a follow-up appointment in 3 months.

## 2017-11-10 NOTE — Progress Notes (Signed)
Subjective:    Patient ID: Bethany [name], female    DOB: May 20, 1945, 72 y.o.   MRN: [id number]  HPI Pt returns for f/u of diabetes mellitus: DM type: Insulin-requiring type 2 Dx'ed: 1998.  Complications: polyneuropathy.  Therapy: insulin since 2009.  GDM: never DKA: never Severe hypoglycemia: never. Pancreatitis: never. Other: she takes 2 QD insulins, after poor results with multiple daily injections, but she often eats just 2 meals per day; she takes human insulin, due to cost.  Interval history: She says she sometimes misses her insulin doses.  She also sometimes does not take until later in the day.  She has mild hypoglycemia when she takes insulin later in the day.  no cbg record, but states cbg varies from 60-240. No new sxs.  Main chronic symptom is doe.   Past Medical History:  Diagnosis Date  . Arthritis   . Diabetes mellitus without complication (HCC)    Type II  . Fibromyalgia   . GERD (gastroesophageal reflux disease)    better after gall bladder surgery-   . Hyperlipidemia   . Hypertension   . Shortness of breath dyspnea    with exertion    Past Surgical History:  Procedure Laterality Date  . ABDOMINAL HYSTERECTOMY  1997  . CATARACT EXTRACTION Bilateral 2014  . CHOLECYSTECTOMY  2010  . COLONOSCOPY    . COLONOSCOPY WITH PROPOFOL N/A 04/21/2015   Procedure: COLONOSCOPY WITH PROPOFOL;  Surgeon: Wallace Cullens, MD;  Location: [facility] ENDOSCOPY;  Service: Gastroenterology;  Laterality: N/A;  . EYE SURGERY Bilateral    Cataract  . LUMBAR WOUND DEBRIDEMENT N/A 10/20/2014   Procedure: LUMBAR WOUND DEBRIDEMENT;  Surgeon: Tia Alert, MD;  Location: [facility] NEURO ORS;  Service: Neurosurgery;  Laterality: N/A;  . MAXIMUM ACCESS (MAS)POSTERIOR LUMBAR INTERBODY FUSION (PLIF) 1 LEVEL N/A 09/16/2014   Procedure: L/4-5 FOR MAXIMUM ACCESS (MAS) POSTERIOR LUMBAR INTERBODY FUSION (PLIF) 1 LEVEL;  Surgeon: Tia Alert, MD;  Location: [facility] NEURO ORS;  Service: Neurosurgery;  Laterality: N/A;   L/4-5 FOR MAXIMUM ACCESS (MAS) POSTERIOR LUMBAR INTERBODY FUSION (PLIF) 1 LEVEL    Social History   Socioeconomic History  . Marital status: Married    Spouse name: Not on file  . Number of children: Not on file  . Years of education: Not on file  . Highest education level: Not on file  Occupational History  . Not on file  Social Needs  . Financial resource strain: Not on file  . Food insecurity:    Worry: Not on file    Inability: Not on file  . Transportation needs:    Medical: Not on file    Non-medical: Not on file  Tobacco Use  . Smoking status: Never Smoker  . Smokeless tobacco: Never Used  Substance and Sexual Activity  . Alcohol use: No  . Drug use: No  . Sexual activity: Not Currently  Lifestyle  . Physical activity:    Days per week: Not on file    Minutes per session: Not on file  . Stress: Not on file  Relationships  . Social connections:    Talks on phone: Not on file    Gets together: Not on file    Attends religious service: Not on file    Active member of club or organization: Not on file    Attends meetings of clubs or organizations: Not on file    Relationship status: Not on file  . Intimate partner violence:  Fear of current or ex partner: Not on file    Emotionally abused: Not on file    Physically abused: Not on file    Forced sexual activity: Not on file  Other Topics Concern  . Not on file  Social History Narrative  . Not on file    Current Outpatient Medications on File Prior to Visit  Medication Sig Dispense Refill  . Alcohol Swabs (B-D SINGLE USE SWABS REGULAR) PADS 1 each by Does not apply route 2 (two) times daily. 200 each 2  . amLODipine (NORVASC) 10 MG tablet Take 1 tablet (10 mg total) by mouth daily. 90 tablet 1  . aspirin 81 MG tablet Take 81 mg by mouth daily. Reported on 03/29/2015    . atorvastatin (LIPITOR) 20 MG tablet Take 1 tablet (20 mg total) by mouth daily. 90 tablet 1  . docusate sodium (COLACE) 100 MG capsule  Take 100 mg by mouth daily as needed for mild constipation. Reported on 03/29/2015    . furosemide (LASIX) 20 MG tablet TAKE ONE (1) TABLET BY MOUTH DAILY AS NEEDED 90 tablet 1  . glucose blood (ACCU-CHEK GUIDE) test strip Used to check blood sugars twice daily. 100 each 12  . insulin NPH Human (NOVOLIN N) 100 UNIT/ML injection Inject 0.3 mLs (30 Units total) into the skin daily before breakfast. 20 mL 11  . insulin regular (NOVOLIN R RELION) 100 units/mL injection Inject 0.55 mLs (55 Units total) into the skin daily with breakfast. 20 mL 11  . losartan (COZAAR) 100 MG tablet Take 1 tablet (100 mg total) by mouth daily. 90 tablet 3  . meloxicam (MOBIC) 15 MG tablet Take 1 tablet (15 mg total) by mouth daily. 90 tablet 0  . metoprolol succinate (TOPROL-XL) 25 MG 24 hr tablet Take 1 tablet (25 mg total) by mouth daily. 90 tablet 2  . naproxen sodium (ANAPROX) 220 MG tablet Take 220 mg by mouth as needed (for pain).     . Omega-3 Fatty Acids (FISH OIL PO) Take 2,400 mg by mouth daily.     Marland Kitchen omeprazole (PRILOSEC) 20 MG capsule TAKE 1 CAPSULE EVERY DAY 90 capsule 0  . TRUEPLUS LANCETS 33G MISC 1 each by Does not apply route 2 (two) times daily. 200 each 2   Current Facility-Administered Medications on File Prior to Visit  Medication Dose Route Frequency Provider Last Rate Last Dose  . betamethasone acetate-betamethasone sodium phosphate (CELESTONE) injection 3 mg  3 mg Intramuscular Once Felecia Shelling, DPM        No Known Allergies  Family History  Problem Relation Age of Onset  . Arthritis Mother   . Cancer Mother        Ovarian  . Diabetes Mother   . Heart disease Father   . Hypertension Father     BP 128/72 (BP Location: Left Arm, Patient Position: Sitting, Cuff Size: Large)   Pulse 83   Ht 5\' 7"  (1.702 m)   Wt 298 lb (135.2 kg)   SpO2 96%   BMI 46.67 kg/m   Review of Systems She denies hypoglycemia.      Objective:   Physical Exam VITAL SIGNS:  See vs page.   GENERAL: no  distress.  Morbid obesity.   Pulses: foot pulses are intact bilaterally.   MSK: no deformity of the feet or ankles.  CV: 1+ bilat edema of the legs Skin:  no ulcer on the feet or ankles.  normal temp on the feet and ankles.  Neuro: sensation is intact to touch on the feet and ankles, but decreased from normal.   Ext: There is bilateral onychomycosis of the toenails.   Lab Results  Component Value Date   CREATININE 1.11 07/21/2017   BUN 17 07/21/2017   NA 140 07/21/2017   K 3.7 07/21/2017   CL 104 07/21/2017   CO2 29 07/21/2017    Lab Results  Component Value Date   HGBA1C 9.1 (A) 11/10/2017       Assessment & Plan:  Insulin-requiring type 2 DM, wit polyneuropathy: worse. Hypoglycemia: this limits aggressiveness of glycemic control.   Patient Instructions  Please continue the same insulins.  Please take both of these right before breakfast. On this type of insulin schedule, you should eat meals on a regular schedule.  If a meal is missed or significantly delayed, your blood sugar could go low.   check your blood sugar twice a day.  vary the time of day when you check, between before the 3 meals, and at bedtime.  also check if you have symptoms of your blood sugar being too high or too low.  please keep a record of the readings and bring it to your next appointment here (or you can bring the meter itself).  You can write it on any piece of paper.  please call us sooner if your blood sugar goes below 70, or if you have a lot of readings over 200.  Please come back for a follow-up appointment in 3 months

## 2017-11-11 ENCOUNTER — Ambulatory Visit (INDEPENDENT_AMBULATORY_CARE_PROVIDER_SITE_OTHER): Payer: Medicare HMO | Admitting: Internal Medicine

## 2017-11-11 ENCOUNTER — Encounter: Payer: Self-pay | Admitting: Internal Medicine

## 2017-11-11 VITALS — BP 128/80 | HR 78 | Temp 98.0°F | Wt 296.0 lb

## 2017-11-11 DIAGNOSIS — I5032 Chronic diastolic (congestive) heart failure: Secondary | ICD-10-CM | POA: Insufficient documentation

## 2017-11-11 DIAGNOSIS — E538 Deficiency of other specified B group vitamins: Secondary | ICD-10-CM

## 2017-11-11 DIAGNOSIS — I10 Essential (primary) hypertension: Secondary | ICD-10-CM

## 2017-11-11 LAB — BASIC METABOLIC PANEL
BUN: 18 mg/dL (ref 6–23)
CO2: 29 mEq/L (ref 19–32)
Calcium: 9.4 mg/dL (ref 8.4–10.5)
Chloride: 99 mEq/L (ref 96–112)
Creatinine, Ser: 1.16 mg/dL (ref 0.40–1.20)
GFR: 58.62 mL/min — ABNORMAL LOW (ref >60.00)
Glucose, Bld: 418 mg/dL — ABNORMAL HIGH (ref 70–99)
Potassium: 4.5 mEq/L (ref 3.5–5.1)
Sodium: 137 mEq/L (ref 135–145)

## 2017-11-11 NOTE — Progress Notes (Signed)
Subjective:    Patient ID: Bethany [name], female    DOB: 1945/04/17, 72 y.o.   MRN: [id number]  HPI  Pt presents to the clinic today for 1 month follow up of HTN. At her last visit, her Losartan was increased to 100 mg daily. She was advised to continue her Amlodipine and Lasix. Metoprolol was added due to new onset CHF. She has been taking the medication as prescribed. Her BP today is 128/80. ECG from 09/2014 reviewed. Echo from 10/2017 reviewed.  Review of Systems      Past Medical History:  Diagnosis Date  . Arthritis   . Diabetes mellitus without complication (HCC)    Type II  . Fibromyalgia   . GERD (gastroesophageal reflux disease)    better after gall bladder surgery-   . Hyperlipidemia   . Hypertension   . Shortness of breath dyspnea    with exertion    Current Outpatient Medications  Medication Sig Dispense Refill  . Alcohol Swabs (B-D SINGLE USE SWABS REGULAR) PADS 1 each by Does not apply route 2 (two) times daily. 200 each 2  . amLODipine (NORVASC) 10 MG tablet Take 1 tablet (10 mg total) by mouth daily. 90 tablet 1  . aspirin 81 MG tablet Take 81 mg by mouth daily. Reported on 03/29/2015    . atorvastatin (LIPITOR) 20 MG tablet Take 1 tablet (20 mg total) by mouth daily. 90 tablet 1  . docusate sodium (COLACE) 100 MG capsule Take 100 mg by mouth daily as needed for mild constipation. Reported on 03/29/2015    . furosemide (LASIX) 20 MG tablet TAKE ONE (1) TABLET BY MOUTH DAILY AS NEEDED 90 tablet 1  . glucose blood (ACCU-CHEK GUIDE) test strip Used to check blood sugars twice daily. 100 each 12  . insulin NPH Human (NOVOLIN N) 100 UNIT/ML injection Inject 0.3 mLs (30 Units total) into the skin daily before breakfast. 20 mL 11  . insulin regular (NOVOLIN R RELION) 100 units/mL injection Inject 0.55 mLs (55 Units total) into the skin daily with breakfast. 20 mL 11  . losartan (COZAAR) 100 MG tablet Take 1 tablet (100 mg total) by mouth daily. 90 tablet 3  . meloxicam  (MOBIC) 15 MG tablet Take 1 tablet (15 mg total) by mouth daily. 90 tablet 0  . metoprolol succinate (TOPROL-XL) 25 MG 24 hr tablet Take 1 tablet (25 mg total) by mouth daily. 90 tablet 2  . naproxen sodium (ANAPROX) 220 MG tablet Take 220 mg by mouth as needed (for pain).     . Omega-3 Fatty Acids (FISH OIL PO) Take 2,400 mg by mouth daily.     Marland Kitchen omeprazole (PRILOSEC) 20 MG capsule TAKE 1 CAPSULE EVERY DAY 90 capsule 0  . TRUEPLUS LANCETS 33G MISC 1 each by Does not apply route 2 (two) times daily. 200 each 2   Current Facility-Administered Medications  Medication Dose Route Frequency Provider Last Rate Last Dose  . betamethasone acetate-betamethasone sodium phosphate (CELESTONE) injection 3 mg  3 mg Intramuscular Once Felecia Shelling, DPM        No Known Allergies  Family History  Problem Relation Age of Onset  . Arthritis Mother   . Cancer Mother        Ovarian  . Diabetes Mother   . Heart disease Father   . Hypertension Father     Social History   Socioeconomic History  . Marital status: Married    Spouse name: Not on file  .  Number of children: Not on file  . Years of education: Not on file  . Highest education level: Not on file  Occupational History  . Not on file  Social Needs  . Financial resource strain: Not on file  . Food insecurity:    Worry: Not on file    Inability: Not on file  . Transportation needs:    Medical: Not on file    Non-medical: Not on file  Tobacco Use  . Smoking status: Never Smoker  . Smokeless tobacco: Never Used  Substance and Sexual Activity  . Alcohol use: No  . Drug use: No  . Sexual activity: Not Currently  Lifestyle  . Physical activity:    Days per week: Not on file    Minutes per session: Not on file  . Stress: Not on file  Relationships  . Social connections:    Talks on phone: Not on file    Gets together: Not on file    Attends religious service: Not on file    Active member of club or organization: Not on file     Attends meetings of clubs or organizations: Not on file    Relationship status: Not on file  . Intimate partner violence:    Fear of current or ex partner: Not on file    Emotionally abused: Not on file    Physically abused: Not on file    Forced sexual activity: Not on file  Other Topics Concern  . Not on file  Social History Narrative  . Not on file     Constitutional: Denies fever, malaise, fatigue, headache or abrupt weight changes.  Respiratory: Denies difficulty breathing, shortness of breath, cough or sputum production.   Cardiovascular: Denies chest pain, chest tightness, palpitations or swelling in the hands or feet.  Musculoskeletal: Pt reports cramping in legs. Denies decrease in range of motion, difficulty with gait, muscle pain or joint pain and swelling.    No other specific complaints in a complete review of systems (except as listed in HPI above).  Objective:   Physical Exam   BP 128/80   Pulse 78   Temp 98 F (36.7 C) (Oral)   Wt 296 lb (134.3 kg)   SpO2 98%   BMI 46.36 kg/m  Wt Readings from Last 3 Encounters:  11/11/17 296 lb (134.3 kg)  11/10/17 298 lb (135.2 kg)  10/06/17 (!) 306 lb 8 oz (139 kg)    General: Appears her stated age, obese, in NAD. Cardiovascular: Normal rate and rhythm. Murmur noted. Trace BLE edema.  Pulmonary/Chest: Normal effort and positive vesicular breath sounds. No respiratory distress. No wheezes, rales or ronchi noted.  Neurological: Alert and oriented.    BMET    Component Value Date/Time   NA 140 07/21/2017 1208   K 3.7 07/21/2017 1208   CL 104 07/21/2017 1208   CO2 29 07/21/2017 1208   GLUCOSE 219 (H) 07/21/2017 1208   BUN 17 07/21/2017 1208   CREATININE 1.11 07/21/2017 1208   CALCIUM 9.0 07/21/2017 1208   GFRNONAA 55 (L) 10/20/2014 1039   GFRAA >60 10/20/2014 1039    Lipid Panel     Component Value Date/Time   CHOL 118 03/20/2017 1136   TRIG 104.0 03/20/2017 1136   HDL 51.80 03/20/2017 1136   CHOLHDL  2 03/20/2017 1136   VLDL 20.8 03/20/2017 1136   LDLCALC 45 03/20/2017 1136    CBC    Component Value Date/Time   WBC 6.9 03/20/2017 1136  RBC 4.79 03/20/2017 1136   HGB 12.7 03/20/2017 1136   HCT 38.8 03/20/2017 1136   PLT 303.0 03/20/2017 1136   MCV 81.1 03/20/2017 1136   MCH 25.6 (L) 10/20/2014 1039   MCHC 32.7 03/20/2017 1136   RDW 15.4 03/20/2017 1136   LYMPHSABS 2.2 09/06/2014 1048   MONOABS 0.5 09/06/2014 1048   EOSABS 0.3 09/06/2014 1048   BASOSABS 0.0 09/06/2014 1048    Hgb A1C Lab Results  Component Value Date   HGBA1C 9.1 (A) 11/10/2017           Assessment & Plan:

## 2017-11-11 NOTE — Patient Instructions (Signed)

## 2017-11-11 NOTE — Assessment & Plan Note (Signed)
Now at goal Continue Losartan, Furosemide, Metoprolol and Amlodipine Having some muscle cramping, BMET today

## 2017-11-11 NOTE — Assessment & Plan Note (Signed)
New onset Compensated on Lasix, Metoprolol, Amlodipine and Losartan Encouraged low salt diet Monitor weights daily

## 2017-11-12 MED ORDER — CYANOCOBALAMIN 1000 MCG/ML IJ SOLN
1000.0000 ug | Freq: Once | INTRAMUSCULAR | Status: AC
  Administered 2017-11-11: 1000 ug via INTRAMUSCULAR

## 2017-11-12 NOTE — Addendum Note (Signed)
Addended by: Roena Malady on: 11/12/2017 01:24 PM   Modules accepted: Orders

## 2017-11-18 ENCOUNTER — Other Ambulatory Visit: Payer: Self-pay | Admitting: Internal Medicine

## 2017-11-18 DIAGNOSIS — K219 Gastro-esophageal reflux disease without esophagitis: Secondary | ICD-10-CM

## 2017-11-24 ENCOUNTER — Other Ambulatory Visit: Payer: Self-pay | Admitting: Internal Medicine

## 2017-11-24 DIAGNOSIS — E7800 Pure hypercholesterolemia, unspecified: Secondary | ICD-10-CM

## 2017-11-24 DIAGNOSIS — I10 Essential (primary) hypertension: Secondary | ICD-10-CM

## 2018-02-11 ENCOUNTER — Ambulatory Visit: Payer: Medicare HMO | Admitting: Endocrinology

## 2018-03-02 ENCOUNTER — Other Ambulatory Visit: Payer: Self-pay | Admitting: Internal Medicine

## 2018-03-04 ENCOUNTER — Emergency Department
Admission: EM | Admit: 2018-03-04 | Discharge: 2018-03-04 | Disposition: A | Discharge status: Home / Self Care | Payer: Medicare HMO | Attending: Emergency Medicine | Admitting: Emergency Medicine

## 2018-03-04 ENCOUNTER — Encounter: Payer: Self-pay | Admitting: Emergency Medicine

## 2018-03-04 DIAGNOSIS — Z7982 Long term (current) use of aspirin: Secondary | ICD-10-CM | POA: Diagnosis not present

## 2018-03-04 DIAGNOSIS — Z79899 Other long term (current) drug therapy: Secondary | ICD-10-CM | POA: Insufficient documentation

## 2018-03-04 DIAGNOSIS — I110 Hypertensive heart disease with heart failure: Secondary | ICD-10-CM | POA: Diagnosis not present

## 2018-03-04 DIAGNOSIS — I10 Essential (primary) hypertension: Secondary | ICD-10-CM | POA: Diagnosis not present

## 2018-03-04 DIAGNOSIS — E119 Type 2 diabetes mellitus without complications: Secondary | ICD-10-CM | POA: Insufficient documentation

## 2018-03-04 DIAGNOSIS — I509 Heart failure, unspecified: Secondary | ICD-10-CM | POA: Insufficient documentation

## 2018-03-04 DIAGNOSIS — M79642 Pain in left hand: Secondary | ICD-10-CM | POA: Insufficient documentation

## 2018-03-04 MED ORDER — LIDOCAINE 5 % EX PTCH: 1.0000 | MEDICATED_PATCH | Freq: Two times a day (BID) | CUTANEOUS | 0 refills | Status: DC

## 2018-03-04 NOTE — Discharge Instructions (Addendum)
Advise orthopedic clinic your follow-up from the emergency room.

## 2018-03-04 NOTE — ED Triage Notes (Signed)
Pt reports has trigger finger in her left hand and for the past couple of week she has had pain that starts in her left hand and radiates all the way up her arm. Pt states the pain and discomfort keep her up at night. Pt rubbing her wrist and forearm currently. Pt denies any other sx's. Denies CO, SOB, radiation to chest, abd pain.

## 2018-03-04 NOTE — ED Provider Notes (Signed)
[facility] Emergency Department Provider Note .[id number]   ____________________________________________   First MD Initiated Contact with Patient 03/04/18 1026     (approximate)  I have reviewed the triage vital signs and the nursing notes.   HISTORY  Chief Complaint Hand Pain    HPI Bethany [name] is a 73 y.o. female patient complain  of left hand pain for 2 weeks.  Patient denies provocative incident for complaint.  Patient is right-hand dominant.  Patient reports decreased grip strength.  Patient states history of trigger finger to the left extremity.  Patient rates her pain discomfort as a 10/10.  Patient described the pain is "achy".  No palliative measure for complaint.   Past Medical History:  Diagnosis Date  . Arthritis   . Diabetes mellitus without complication (HCC)    Type II  . Fibromyalgia   . GERD (gastroesophageal reflux disease)    better after gall bladder surgery-   . Hyperlipidemia   . Hypertension   . Shortness of breath dyspnea    with exertion    Patient Active Problem List   Diagnosis Date Noted  . CHF (congestive heart failure), NYHA class I, chronic, diastolic (HCC) 11/11/2017  . GERD (gastroesophageal reflux disease) 03/21/2017  . Arthritis 03/21/2017  . Fibromyalgia 03/21/2017  . Diabetes mellitus type 2, uncontrolled, with complications (HCC) 08/02/2013  . HLD (hyperlipidemia) 08/02/2013  . Essential hypertension 08/02/2013    Past Surgical History:  Procedure Laterality Date  . ABDOMINAL HYSTERECTOMY  1997  . CATARACT EXTRACTION Bilateral 2014  . CHOLECYSTECTOMY  2010  . COLONOSCOPY    . COLONOSCOPY WITH PROPOFOL N/A 04/21/2015   Procedure: COLONOSCOPY WITH PROPOFOL;  Surgeon: Wallace CullensPaul Y Oh, MD;  [facility];  Service: Gastroenterology;  Laterality: N/A;  . EYE SURGERY Bilateral    Cataract  . LUMBAR WOUND DEBRIDEMENT N/A 10/20/2014   Procedure: LUMBAR WOUND DEBRIDEMENT;  Surgeon: Tia Alertavid S  Jones, MD;  Location: [facility] NEURO ORS;  Service: Neurosurgery;  Laterality: N/A;  . MAXIMUM ACCESS (MAS)POSTERIOR LUMBAR INTERBODY FUSION (PLIF) 1 LEVEL N/A 09/16/2014   Procedure: L/4-5 FOR MAXIMUM ACCESS (MAS) POSTERIOR LUMBAR INTERBODY FUSION (PLIF) 1 LEVEL;  Surgeon: Tia Alertavid S Jones, MD;  Location: [facility] NEURO ORS;  Service: Neurosurgery;  Laterality: N/A;  L/4-5 FOR MAXIMUM ACCESS (MAS) POSTERIOR LUMBAR INTERBODY FUSION (PLIF) 1 LEVEL    Prior to Admission medications   Medication Sig Start Date End Date Taking? Authorizing Provider  Alcohol Swabs (B-D SINGLE USE SWABS REGULAR) PADS USE TWICE DAILY 03/02/18   Lorre MunroeBaity, Regina W, NP  amLODipine (NORVASC) 10 MG tablet TAKE 1 TABLET EVERY DAY 11/26/17   Lorre MunroeBaity, Regina W, NP  aspirin 81 MG tablet Take 81 mg by mouth daily. Reported on 03/29/2015    [provider]  atorvastatin (LIPITOR) 20 MG tablet TAKE 1 TABLET EVERY DAY 11/26/17   Lorre MunroeBaity, Regina W, NP  docusate sodium (COLACE) 100 MG capsule Take 100 mg by mouth daily as needed for mild constipation. Reported on 03/29/2015    [provider]  furosemide (LASIX) 20 MG tablet TAKE 1 TABLET EVERY DAY AS NEEDED 11/26/17   Lorre MunroeBaity, Regina W, NP  glucose blood (ACCU-CHEK GUIDE) test strip Used to check blood sugars twice daily. 05/08/17   Romero BellingEllison, Sean, MD  insulin NPH Human (NOVOLIN N) 100 UNIT/ML injection Inject 0.3 mLs (30 Units total) into the skin daily before breakfast. 09/10/17   Romero BellingEllison, Sean, MD  insulin regular (NOVOLIN R RELION) 100 units/mL injection Inject  0.55 mLs (55 Units total) into the skin daily with breakfast. 09/10/17   Romero Belling, MD  lidocaine (LIDODERM) 5 % Place 1 patch onto the skin every 12 (twelve) hours. Remove & Discard patch within 12 hours or as directed by MD 03/04/18 03/04/19  Joni Reining, PA-C  losartan (COZAAR) 100 MG tablet Take 1 tablet (100 mg total) by mouth daily. 10/06/17   Lorre Munroe, NP  losartan (COZAAR) 50 MG tablet TAKE 1 TABLET EVERY DAY 11/19/17    Lorre Munroe, NP  meloxicam (MOBIC) 15 MG tablet Take 1 tablet (15 mg total) by mouth daily. 10/06/17   Lorre Munroe, NP  metoprolol succinate (TOPROL-XL) 25 MG 24 hr tablet Take 1 tablet (25 mg total) by mouth daily. 10/27/17   Lorre Munroe, NP  naproxen sodium (ANAPROX) 220 MG tablet Take 220 mg by mouth as needed (for pain).     [provider]  Omega-3 Fatty Acids (FISH OIL PO) Take 2,400 mg by mouth daily.     [provider]  omeprazole (PRILOSEC) 20 MG capsule TAKE 1 CAPSULE EVERY DAY 11/19/17   Lorre Munroe, NP  TRUEPLUS LANCETS 33G MISC TEST BLOOD SUGAR TWICE DAILY 03/02/18   Lorre Munroe, NP    Allergies Patient has no known allergies.  Family History  Problem Relation Age of Onset  . Arthritis Mother   . Cancer Mother        Ovarian  . Diabetes Mother   . Heart disease Father   . Hypertension Father     Social History Social History   Tobacco Use  . Smoking status: Never Smoker  . Smokeless tobacco: Never Used  Substance Use Topics  . Alcohol use: No  . Drug use: No    Review of Systems Constitutional: No fever/chills Eyes: No visual changes. ENT: No sore throat. Cardiovascular: Denies chest pain. Respiratory: Denies shortness of breath. Gastrointestinal: No abdominal pain.  No nausea, no vomiting.  No diarrhea.  No constipation. Genitourinary: Negative for dysuria. Musculoskeletal: Left hand pain. Skin: Negative for rash. Neurological: Negative for headaches, focal weakness or numbness. Endocrine:  Diabetes, hyperlipidemia, hypertension.   ____________________________________________   PHYSICAL EXAM:  VITAL SIGNS: ED Triage Vitals  Enc Vitals Group     BP 03/04/18 0958 (!) 148/62     Pulse Rate 03/04/18 0959 79     Resp 03/04/18 0959 20     Temp 03/04/18 0959 98.2 F (36.8 C)     Temp Source 03/04/18 0959 Oral     SpO2 03/04/18 0959 99 %     Weight 03/04/18 0953 270 lb (122.5 kg)     Height 03/04/18 0953 5\' 7"   (1.702 m)     Head Circumference --      Peak Flow --      Pain Score 03/04/18 0952 10     Pain Loc --      Pain Edu? --      Excl. in GC? --    Constitutional: Alert and oriented. Well appearing and in no acute distress. Cardiovascular: Normal rate, regular rhythm. Grossly normal heart sounds.  Good peripheral circulation. Respiratory: Normal respiratory effort.  No retractions. Lungs CTAB. Musculoskeletal: No obvious deformities to the left hand/fingers.  Patient has full equal range of motion.  Patient has mild guarding palpation of the metatarsal heads.   Neurologic:  Normal speech and language. No gross focal neurologic deficits are appreciated. No gait instability. Skin:  Skin is warm,  dry and intact. No rash noted. Psychiatric: Mood and affect are normal. Speech and behavior are normal.  ____________________________________________   LABS (all labs ordered are listed, but only abnormal results are displayed)  Labs Reviewed - No data to display ____________________________________________  EKG   ____________________________________________  RADIOLOGY  ED MD interpretation:    Official radiology report(s): No results found.  ____________________________________________   PROCEDURES  Procedure(s) performed: None  Procedures  Critical Care performed: No  ____________________________________________   INITIAL IMPRESSION / ASSESSMENT AND PLAN / ED COURSE  As part of my medical decision making, I reviewed the following data within the electronic MEDICAL RECORD NUMBER     Patient presents with 2 weeks of right hand pain.  Physical exam was grossly unremarkable.  Patient given discharge care instruction advised follow orthopedics for definitive evaluation and treatment.      ____________________________________________   FINAL CLINICAL IMPRESSION(S) / ED DIAGNOSES  Final diagnoses:  Left hand pain     ED Discharge Orders         Ordered    lidocaine  (LIDODERM) 5 %  Every 12 hours     03/04/18 1047           Note:  This document was prepared using Dragon voice recognition software and may include unintentional dictation errors.    Joni Reining, PA-C 03/04/18 1055    Emily Filbert, MD 03/04/18 1114

## 2018-03-05 DIAGNOSIS — M65332 Trigger finger, left middle finger: Secondary | ICD-10-CM | POA: Diagnosis not present

## 2018-03-05 DIAGNOSIS — M6530 Trigger finger, unspecified finger: Secondary | ICD-10-CM | POA: Insufficient documentation

## 2018-04-06 ENCOUNTER — Other Ambulatory Visit: Payer: Self-pay | Admitting: Internal Medicine

## 2018-04-06 DIAGNOSIS — I10 Essential (primary) hypertension: Secondary | ICD-10-CM

## 2018-06-03 ENCOUNTER — Encounter: Payer: Self-pay | Admitting: Family Medicine

## 2018-06-03 ENCOUNTER — Ambulatory Visit (INDEPENDENT_AMBULATORY_CARE_PROVIDER_SITE_OTHER): Payer: Medicare HMO | Admitting: Family Medicine

## 2018-06-03 DIAGNOSIS — J029 Acute pharyngitis, unspecified: Secondary | ICD-10-CM

## 2018-06-03 DIAGNOSIS — R05 Cough: Secondary | ICD-10-CM

## 2018-06-03 DIAGNOSIS — E118 Type 2 diabetes mellitus with unspecified complications: Secondary | ICD-10-CM | POA: Diagnosis not present

## 2018-06-03 DIAGNOSIS — R0981 Nasal congestion: Secondary | ICD-10-CM | POA: Diagnosis not present

## 2018-06-03 DIAGNOSIS — IMO0002 Reserved for concepts with insufficient information to code with codable children: Secondary | ICD-10-CM

## 2018-06-03 DIAGNOSIS — E1165 Type 2 diabetes mellitus with hyperglycemia: Secondary | ICD-10-CM | POA: Diagnosis not present

## 2018-06-03 DIAGNOSIS — R059 Cough, unspecified: Secondary | ICD-10-CM

## 2018-06-03 MED ORDER — FLUTICASONE PROPIONATE 50 MCG/ACT NA SUSP: 2.0000 | Freq: Every day | NASAL | 6 refills | Status: DC

## 2018-06-03 NOTE — Progress Notes (Signed)
Virtual Visit via Video Note  I connected with Bethany [name] on 06/03/18 at  3:39 PM EDT by a video enabled telemedicine application and verified that I am speaking with the correct person using two identifiers.  Location: Patient: In her home Provider: LBPCMila Merry   I discussed the limitations of evaluation and management by telemedicine and the availability of in person appointments. The patient expressed understanding and agreed to proceed.  History of Present Illness: This is a 73 yo female who requests virtual visit today to discuss sore throat, hoarseness, nasal congestion and slight cough that have been present for about 2 weeks.  Symptoms started with watery nasal drainage.  She does not have a history of seasonal allergies but is currently living with her daughter in her neighborhood that [place].  She reports that there has been a lot of pollen and dust.  She denies ear pain, fever, increased shortness of breath.  She has a mild headache which is resolved when she blows her nose.  She does have some thick and dark sputum in the mornings when she awakens as well as some matted eyes.  She generally feels pretty good and has not been achy.  She has taken some Tylenol Cold with a little relief.  Diabetes mellitus type 2- followed by endocrine.  She states she missed her last appointment because she was out of town.  She is trying to get caught up on her doctor visits.  Blood sugars running 1 50-200.  Were recently elevated after she had a steroid injection for trigger finger.  Past Medical History:  Diagnosis Date  . Arthritis   . Diabetes mellitus without complication (HCC)    Type II  . Fibromyalgia   . GERD (gastroesophageal reflux disease)    better after gall bladder surgery-   . Hyperlipidemia   . Hypertension   . Shortness of breath dyspnea    with exertion   Past Surgical History:  Procedure Laterality Date  . ABDOMINAL HYSTERECTOMY  1997  . CATARACT  EXTRACTION Bilateral 2014  . CHOLECYSTECTOMY  2010  . COLONOSCOPY    . COLONOSCOPY WITH PROPOFOL N/A 04/21/2015   Procedure: COLONOSCOPY WITH PROPOFOL;  Surgeon: Wallace Cullens, MD;  Location[facility] ENDOSCOPY;  Service: Gastroenterology;  Laterality: N/A;  . EYE SURGERY Bilateral    Cataract  . LUMBAR WOUND DEBRIDEMENT N/A 10/20/2014   Procedure: LUMBAR WOUND DEBRIDEMENT;  Surgeon: Tia Alert, MD;  Location: [facility] NEURO ORS;  Service: Neurosurgery;  Laterality: N/A;  . MAXIMUM ACCESS (MAS)POSTERIOR LUMBAR INTERBODY FUSION (PLIF) 1 LEVEL N/A 09/16/2014   Procedure: L/4-5 FOR MAXIMUM ACCESS (MAS) POSTERIOR LUMBAR INTERBODY FUSION (PLIF) 1 LEVEL;  Surgeon: Tia Alert, MD;  Location: [facility] NEURO ORS;  Service: Neurosurgery;  Laterality: N/A;  L/4-5 FOR MAXIMUM ACCESS (MAS) POSTERIOR LUMBAR INTERBODY FUSION (PLIF) 1 LEVEL   Family History  Problem Relation Age of Onset  . Arthritis Mother   . Cancer Mother        Ovarian  . Diabetes Mother   . Heart disease Father   . Hypertension Father    Social History   Tobacco Use  . Smoking status: Never Smoker  . Smokeless tobacco: Never Used  Substance Use Topics  . Alcohol use: No  . Drug use: No      Observations/Objective: Patient is alert and answers questions appropriately.  She appears younger than stated age.  She is normally conversive without shortness of breath.  Visible  skin is unremarkable.  Mood and affect are appropriate.  There were no vitals taken for this visit. BP Readings from Last 3 Encounters:  03/04/18 (!) 148/62  11/11/17 128/80  11/10/17 128/72   Wt Readings from Last 3 Encounters:  03/04/18 270 lb (122.5 kg)  11/11/17 296 lb (134.3 kg)  11/10/17 298 lb (135.2 kg)    Assessment and Plan: 1. Nasal congestion -Symptoms likely to be allergic in nature but cannot rule out viral etiology.  Low suspicion for bacterial source. -Start her on steroid nasal spray, saline nasal spray and encourage fluids -Return to clinic  precautions reviewed - fluticasone (FLONASE) 50 MCG/ACT nasal spray; Place 2 sprays into both nostrils daily.  Dispense: 16 g; Refill: 6  2. Cough -No increased shortness of breath, thick colored phlegm in the a.m. only, likely related to postnasal drainage  3. Sore throat - see #2  4. Diabetes mellitus type 2, uncontrolled, with complications (HCC) -Encouraged her to follow-up with her endocrinologist, Dr. Everardo AllEllison.  Patient stated she would call and make appointment -She has me for refills on her diabetic test strips but she could not remember the name.  I told her to call back to the office when she had the bottle in front of her and we would be happy to refill those for her.   Olean Reeeborah Gessner, FNP-BC  [facility]  06/03/2018 4:06 PM   Follow Up Instructions: The patient was not sure if she was able to see her MyChart messages.  I did send her a MyChart message in follow-up as well as mailed copy of her after visit summary.   I discussed the assessment and treatment plan with the patient. The patient was provided an opportunity to ask questions and all were answered. The patient agreed with the plan and demonstrated an understanding of the instructions.   The patient was advised to call back or seek an in-person evaluation if the symptoms worsen or if the condition fails to improve as anticipated.    Emi Belfasteborah B Gessner, FNP

## 2018-06-03 NOTE — Patient Instructions (Signed)
Hi Bethany [name],  It was a pleasure to see you today for your virtual visit.  I think your symptoms could be related to environmental irritants.  I have sent in a prescription for a steroid nasal spray which hopefully you have already obtained.  I want you to use this every night, 2 sprays in each nostril.  You can use this until symptoms resolve and then restart if needed. You can also use saline nasal spray 2-5 times a day as needed for nasal congestion.  It works well to rinse the sinuses of irritants. If you develop a fever, worsening shortness of breath or increased phlegm with cough please let us know.  Please follow-up with Dr. Everardo All for your diabetes.

## 2018-06-26 ENCOUNTER — Telehealth: Payer: Self-pay | Admitting: *Deleted

## 2018-06-26 NOTE — Telephone Encounter (Signed)
In the meantime, do you want her seen by Dr. Dwyane Dee, [facility] or [facility]. OR would you prefer to see her when you return?

## 2018-06-26 NOTE — Telephone Encounter (Signed)
Please refer to message below. Overdue for an appt. LOV 11/10/17. You advised f/u in 3 mo. Will call to schedule appt. Please advise if you would like her to be evaluated by another provider until your return.

## 2018-06-26 NOTE — Telephone Encounter (Signed)
Noted. Will see her at her scheduled appt time.

## 2018-06-26 NOTE — Telephone Encounter (Signed)
Time of day of hypoglycemia is not stated, so please reduce reg insulin to 40 units with breakfast, and reduce the NPH to 30 units qam.

## 2018-06-26 NOTE — Telephone Encounter (Signed)
Patient called the office about her blood sugar dropping, so scheduler scheduled her an appointment with Webb Silversmith NP for 06/29/18. Spoke to patient and realized that she sees Dr. Loanne Drilling for her diabetes. Patient stated that her blood sugar has dropped several times in the last few weeks and when that happens she gets confused. Patient stated that she has been driving when this has happened and she has a hard time getting home. Patient stated that after she drinks a soda she gets to feeling better. Patient stated that her sugar has dropped as low as 40. Patient stated that she has tried to get in touch with Dr. Cordelia Pen office and gets a recording. Patient stated that she spoke to a gentleman at [phone] about an hour ago and was advised that he would send a message to Dr. Loanne Drilling regarding her issues.  Advised patient that she needs to communicate with Dr. Loanne Drilling since he is the one that overseas her diabetes, which patient agreed. Patient stated that she still wants to keep her appointment with Webb Silversmith NP on Monday because she is having other problems with aches and pains. Will route this message to Webb Silversmith NP and Dr. Cordelia Pen office for review.

## 2018-06-26 NOTE — Telephone Encounter (Signed)
F/u ov with me next available.

## 2018-06-29 ENCOUNTER — Ambulatory Visit (INDEPENDENT_AMBULATORY_CARE_PROVIDER_SITE_OTHER): Payer: Medicare HMO | Admitting: Internal Medicine

## 2018-06-29 ENCOUNTER — Other Ambulatory Visit: Payer: Self-pay

## 2018-06-29 ENCOUNTER — Encounter: Payer: Self-pay | Admitting: Internal Medicine

## 2018-06-29 VITALS — BP 146/86 | HR 82 | Temp 97.8°F | Wt 294.0 lb

## 2018-06-29 DIAGNOSIS — Z794 Long term (current) use of insulin: Secondary | ICD-10-CM

## 2018-06-29 DIAGNOSIS — I5032 Chronic diastolic (congestive) heart failure: Secondary | ICD-10-CM

## 2018-06-29 DIAGNOSIS — I10 Essential (primary) hypertension: Secondary | ICD-10-CM | POA: Diagnosis not present

## 2018-06-29 DIAGNOSIS — E1151 Type 2 diabetes mellitus with diabetic peripheral angiopathy without gangrene: Secondary | ICD-10-CM | POA: Diagnosis not present

## 2018-06-29 DIAGNOSIS — M797 Fibromyalgia: Secondary | ICD-10-CM | POA: Diagnosis not present

## 2018-06-29 DIAGNOSIS — M1990 Unspecified osteoarthritis, unspecified site: Secondary | ICD-10-CM

## 2018-06-29 DIAGNOSIS — E7800 Pure hypercholesterolemia, unspecified: Secondary | ICD-10-CM | POA: Diagnosis not present

## 2018-06-29 DIAGNOSIS — K219 Gastro-esophageal reflux disease without esophagitis: Secondary | ICD-10-CM

## 2018-06-29 LAB — HEMOGLOBIN A1C: Hgb A1c MFr Bld: 9.8 % — ABNORMAL HIGH (ref 4.6–6.5)

## 2018-06-29 LAB — COMPREHENSIVE METABOLIC PANEL
ALT: 11 U/L (ref 0–35)
AST: 13 U/L (ref 0–37)
Albumin: 4.1 g/dL (ref 3.5–5.2)
Alkaline Phosphatase: 117 U/L (ref 39–117)
BUN: 18 mg/dL (ref 6–23)
CO2: 30 mEq/L (ref 19–32)
Calcium: 9.4 mg/dL (ref 8.4–10.5)
Chloride: 101 mEq/L (ref 96–112)
Creatinine, Ser: 1.05 mg/dL (ref 0.40–1.20)
GFR: 61.77 mL/min (ref >60.00)
Glucose, Bld: 296 mg/dL — ABNORMAL HIGH (ref 70–99)
Potassium: 3.8 mEq/L (ref 3.5–5.1)
Sodium: 138 mEq/L (ref 135–145)
Total Bilirubin: 0.4 mg/dL (ref 0.2–1.2)
Total Protein: 7.9 g/dL (ref 6.0–8.3)

## 2018-06-29 LAB — LIPID PANEL
Cholesterol: 128 mg/dL (ref 0–200)
HDL: 42.7 mg/dL (ref >39.00)
LDL Cholesterol: 59 mg/dL (ref 0–99)
NonHDL: 85.04
Total CHOL/HDL Ratio: 3
Triglycerides: 132 mg/dL (ref 0.0–149.0)
VLDL: 26.4 mg/dL (ref 0.0–40.0)

## 2018-06-29 LAB — CBC
HCT: 38 % (ref 36.0–46.0)
Hemoglobin: 12.2 g/dL (ref 12.0–15.0)
MCHC: 32.1 g/dL (ref 30.0–36.0)
MCV: 82 fl (ref 78.0–100.0)
Platelets: 279 10*3/uL (ref 150.0–400.0)
RBC: 4.63 Mil/uL (ref 3.87–5.11)
RDW: 14.9 % (ref 11.5–15.5)
WBC: 7.3 10*3/uL (ref 4.0–10.5)

## 2018-06-29 NOTE — Progress Notes (Signed)
Subjective:    Patient ID: Bethany [name], female    DOB: 01-Oct-1945, 73 y.o.   MRN: [id number]  HPI  Pt presents to the clinic today to follow up chronic conditions.  OA: Mainly in her knees and ankles. She takes Meloxicam daily and Voltaren Gel as needed with good relief.  CHF Diastolic: She denies chronic cough or shortness of breath. She is having some swelling in her legs.  Managed on Furosemide, Losartan and Metoprolol. Echo from 10/2017 reviewed.  DM 2: [name] was 9.1 %, 10/2017. She is checking her sugars once a day. Her sugars range 30- 480 (after trigger finger injection). She is managed on NPH and Regular insulin. She checks her feet routinely. Her last eye exam was > 1 year ago. Flu 01/2016. Pneumova never. Prevnar never. She follows with Dr. Everardo AllEllison.  HTN: Her BP today is 146/82 . She is taking Amlodipine, Losartan, Furosemide and Metoprolol as prescribed. ECG from 02/2018 reviewed.  HLD: [name] was 45, 03/2017. She denies myalgias on Atorvastatin. She tries to consume a low fat diet.  GERD: She denies breakthrough on Omeprazole. There is no upper GI on file.  Fibromyalgia: She aches in her shoulders, worsened by cold air. This has not been a huge complaint for her.   Review of Systems      Past Medical History:  Diagnosis Date  . Arthritis   . Diabetes mellitus without complication (HCC)    Type II  . Fibromyalgia   . GERD (gastroesophageal reflux disease)    better after gall bladder surgery-   . Hyperlipidemia   . Hypertension   . Shortness of breath dyspnea    with exertion    Current Outpatient Medications  Medication Sig Dispense Refill  . Alcohol Swabs (B-D SINGLE USE SWABS REGULAR) PADS USE TWICE DAILY 200 each 2  . amLODipine (NORVASC) 10 MG tablet TAKE 1 TABLET EVERY DAY 90 tablet 0  . aspirin 81 MG tablet Take 81 mg by mouth daily. Reported on 03/29/2015    . atorvastatin (LIPITOR) 20 MG tablet TAKE 1 TABLET EVERY DAY 90 tablet 0  .  docusate sodium (COLACE) 100 MG capsule Take 100 mg by mouth daily as needed for mild constipation. Reported on 03/29/2015    . fluticasone (FLONASE) 50 MCG/ACT nasal spray Place 2 sprays into both nostrils daily. 16 g 6  . furosemide (LASIX) 20 MG tablet TAKE 1 TABLET EVERY DAY AS NEEDED 90 tablet 0  . glucose blood (ACCU-CHEK GUIDE) test strip Used to check blood sugars twice daily. 100 each 12  . insulin NPH Human (NOVOLIN N) 100 UNIT/ML injection Inject 0.3 mLs (30 Units total) into the skin daily before breakfast. 20 mL 11  . insulin regular (NOVOLIN R RELION) 100 units/mL injection Inject 0.55 mLs (55 Units total) into the skin daily with breakfast. 20 mL 11  . losartan (COZAAR) 100 MG tablet Take 1 tablet (100 mg total) by mouth daily. 90 tablet 3  . losartan (COZAAR) 50 MG tablet TAKE 1 TABLET EVERY DAY 90 tablet 0  . meloxicam (MOBIC) 15 MG tablet Take 1 tablet (15 mg total) by mouth daily. 90 tablet 0  . metoprolol succinate (TOPROL-XL) 25 MG 24 hr tablet Take 1 tablet (25 mg total) by mouth daily. 90 tablet 2  . naproxen sodium (ANAPROX) 220 MG tablet Take 220 mg by mouth as needed (for pain).     . Omega-3 Fatty Acids (FISH OIL PO) Take 2,400  mg by mouth daily.     Marland Kitchen omeprazole (PRILOSEC) 20 MG capsule TAKE 1 CAPSULE EVERY DAY 90 capsule 0  . TRUEPLUS LANCETS 33G MISC TEST BLOOD SUGAR TWICE DAILY 200 each 2   Current Facility-Administered Medications  Medication Dose Route Frequency Provider Last Rate Last Dose  . betamethasone acetate-betamethasone sodium phosphate (CELESTONE) injection 3 mg  3 mg Intramuscular Once Edrick Kins, DPM        No Known Allergies  Family History  Problem Relation Age of Onset  . Arthritis Mother   . Cancer Mother        Ovarian  . Diabetes Mother   . Heart disease Father   . Hypertension Father     Social History   Socioeconomic History  . Marital status: Married    Spouse name: Not on file  . Number of children: Not on file  . Years  of education: Not on file  . Highest education level: Not on file  Occupational History  . Not on file  Social Needs  . Financial resource strain: Not on file  . Food insecurity    Worry: Not on file    Inability: Not on file  . Transportation needs    Medical: Not on file    Non-medical: Not on file  Tobacco Use  . Smoking status: Never Smoker  . Smokeless tobacco: Never Used  Substance and Sexual Activity  . Alcohol use: No  . Drug use: No  . Sexual activity: Not Currently  Lifestyle  . Physical activity    Days per week: Not on file    Minutes per session: Not on file  . Stress: Not on file  Relationships  . Social Herbalist on phone: Not on file    Gets together: Not on file    Attends religious service: Not on file    Active member of club or organization: Not on file    Attends meetings of clubs or organizations: Not on file    Relationship status: Not on file  . Intimate partner violence    Fear of current or ex partner: Not on file    Emotionally abused: Not on file    Physically abused: Not on file    Forced sexual activity: Not on file  Other Topics Concern  . Not on file  Social History Narrative  . Not on file     Constitutional: Denies fever, malaise, fatigue, headache or abrupt weight changes.  HEENT: Denies eye pain, eye redness, ear pain, ringing in the ears, wax buildup, runny nose, nasal congestion, bloody nose, or sore throat. Respiratory: Denies difficulty breathing, shortness of breath, cough or sputum production.   Cardiovascular: Pt reports swelling in BLE. Denies chest pain, chest tightness, palpitations or swelling in the hands.  Gastrointestinal: Denies abdominal pain, bloating, constipation, diarrhea or blood in the stool.  GU: Denies urgency, frequency, pain with urination, burning sensation, blood in urine, odor or discharge. Musculoskeletal: Pt reports bilateral knee pain, difficulty with gait.  Denies decrease in range of  motion, muscle pain or joint swelling.  Skin: Pt reports skin discoloration of BLE. Denies redness, rashes, lesions or ulcercations.  Neurological: Denies dizziness, difficulty with memory, difficulty with speech or problems with balance and coordination.  Psych: Denies anxiety, depression, SI/HI.  No other specific complaints in a complete review of systems (except as listed in HPI above).  Objective:   Physical Exam  BP (!) 146/86   Pulse 82  Temp 97.8 F (36.6 C) (Oral)   Wt 294 lb (133.4 kg)   SpO2 98%   BMI 46.05 kg/m  Wt Readings from Last 3 Encounters:  06/29/18 294 lb (133.4 kg)  03/04/18 270 lb (122.5 kg)  11/11/17 296 lb (134.3 kg)    General: Appears her stated age, obese, in NAD. Skin: Warm, dry and intact. PVD changes noted of BLE. Cardiovascular: Normal rate and rhythm. S1,S2 noted.  No murmur, rubs or gallops noted. 1+ pitting BLE edema. No carotid bruits noted. Pulmonary/Chest: Normal effort and positive vesicular breath sounds. No respiratory distress. No wheezes, rales or ronchi noted.  Musculoskeletal: Crepitus noted with flexion and extension of the knees. Joints enlarged without swelling. Gait slow and steady with use of a cane.  Neurological: Alert and oriented. Sensation intact to BLE.   BMET    Component Value Date/Time   NA 137 11/11/2017 1151   K 4.5 11/11/2017 1151   CL 99 11/11/2017 1151   CO2 29 11/11/2017 1151   GLUCOSE 418 (H) 11/11/2017 1151   BUN 18 11/11/2017 1151   CREATININE 1.16 11/11/2017 1151   CALCIUM 9.4 11/11/2017 1151   GFRNONAA 55 (L) 10/20/2014 1039   GFRAA >60 10/20/2014 1039    Lipid Panel     Component Value Date/Time   CHOL 118 03/20/2017 1136   TRIG 104.0 03/20/2017 1136   HDL 51.80 03/20/2017 1136   CHOLHDL 2 03/20/2017 1136   VLDL 20.8 03/20/2017 1136   LDLCALC 45 03/20/2017 1136    CBC    Component Value Date/Time   WBC 6.9 03/20/2017 1136   RBC 4.79 03/20/2017 1136   HGB 12.7 03/20/2017 1136   HCT  38.8 03/20/2017 1136   PLT 303.0 03/20/2017 1136   MCV 81.1 03/20/2017 1136   MCH 25.6 (L) 10/20/2014 1039   MCHC 32.7 03/20/2017 1136   RDW 15.4 03/20/2017 1136   LYMPHSABS 2.2 09/06/2014 1048   MONOABS 0.5 09/06/2014 1048   EOSABS 0.3 09/06/2014 1048   BASOSABS 0.0 09/06/2014 1048    Hgb A1C Lab Results  Component Value Date   HGBA1C 9.1 (A) 11/10/2017            Assessment & Plan:

## 2018-06-29 NOTE — Assessment & Plan Note (Signed)
Discussed how avoiding foods that trigger reflux and weight loss can help improve reflux CBC and CMET Today Continue Omeprazole for now

## 2018-06-29 NOTE — Assessment & Plan Note (Signed)
Discussed how weight loss could help improve joint pain Encouraged regular physical activity Advised her to start Glucosamine Chondroitin OTC

## 2018-06-29 NOTE — Assessment & Plan Note (Signed)
A1C today No microalbumin secondary to ARB therapy Encouraged her to consume a low carb diet and exercise for weight loss Advised her NPH and regular insulin probably wouldn't be the best choice of medication if she is not routinely checking her sugars and eating frequently enough Foot exam today Advised her to schedule her yearly eye exam Flu shot UTD She declines pneumovax and prevnar Will try to reach out to Dr. Loanne Drilling to discuss treatment plan once results are back

## 2018-06-29 NOTE — Assessment & Plan Note (Signed)
Mild lower extremity edema CMET today Reinforced DASH diet, exercise for weight loss Continue Furosemide, Losartan and Metoprolol Will monitor

## 2018-06-29 NOTE — Assessment & Plan Note (Signed)
Does not seem to be a major concern Encouraged regular physical activity

## 2018-06-29 NOTE — Assessment & Plan Note (Signed)
Slightly elevated, but she was rushing today Reinforced DASH diet and exercise for weight loss Continue Amlodipine, Losartan and Metoprolol CBC and CMET today

## 2018-06-29 NOTE — Patient Instructions (Signed)
Carbohydrate Counting for Diabetes Mellitus, Adult  Carbohydrate counting is a method of keeping track of how many carbohydrates you eat. Eating carbohydrates naturally increases the amount of sugar (glucose) in the blood. Counting how many carbohydrates you eat helps keep your blood glucose within normal limits, which helps you manage your diabetes (diabetes mellitus). It is important to know how many carbohydrates you can safely have in each meal. This is different for every person. A diet and nutrition specialist (registered dietitian) can help you make a meal plan and calculate how many carbohydrates you should have at each meal and snack. Carbohydrates are found in the following foods:  Grains, such as breads and cereals.  Dried beans and soy products.  Starchy vegetables, such as potatoes, peas, and corn.  Fruit and fruit juices.  Milk and yogurt.  Sweets and snack foods, such as cake, cookies, candy, chips, and soft drinks. How do I count carbohydrates? There are two ways to count carbohydrates in food. You can use either of the methods or a combination of both. Reading "Nutrition Facts" on packaged food The "Nutrition Facts" list is included on the labels of almost all packaged foods and beverages in the [place] It includes:  The serving size.  Information about nutrients in each serving, including the grams (g) of carbohydrate per serving. To use the "Nutrition Facts":  Decide how many servings you will have.  Multiply the number of servings by the number of carbohydrates per serving.  The resulting number is the total amount of carbohydrates that you will be having. Learning standard serving sizes of other foods When you eat carbohydrate foods that are not packaged or do not include "Nutrition Facts" on the label, you need to measure the servings in order to count the amount of carbohydrates:  Measure the foods that you will eat with a food scale or measuring cup, if needed.   Decide how many standard-size servings you will eat.  Multiply the number of servings by 15. Most carbohydrate-rich foods have about 15 g of carbohydrates per serving. ? For example, if you eat 8 oz (170 g) of strawberries, you will have eaten 2 servings and 30 g of carbohydrates (2 servings x 15 g = 30 g).  For foods that have more than one food mixed, such as soups and casseroles, you must count the carbohydrates in each food that is included. The following list contains standard serving sizes of common carbohydrate-rich foods. Each of these servings has about 15 g of carbohydrates:   hamburger bun or  English muffin.   oz (15 mL) syrup.   oz (14 g) jelly.  1 slice of bread.  1 six-inch tortilla.  3 oz (85 g) cooked rice or pasta.  4 oz (113 g) cooked dried beans.  4 oz (113 g) starchy vegetable, such as peas, corn, or potatoes.  4 oz (113 g) hot cereal.  4 oz (113 g) mashed potatoes or  of a large baked potato.  4 oz (113 g) canned or frozen fruit.  4 oz (120 mL) fruit juice.  4-6 crackers.  6 chicken nuggets.  6 oz (170 g) unsweetened dry cereal.  6 oz (170 g) plain fat-free yogurt or yogurt sweetened with artificial sweeteners.  8 oz (240 mL) milk.  8 oz (170 g) fresh fruit or one small piece of fruit.  24 oz (680 g) popped popcorn. Example of carbohydrate counting Sample meal  3 oz (85 g) chicken breast.  6 oz (170 g)   brown rice.  4 oz (113 g) corn.  8 oz (240 mL) milk.  8 oz (170 g) strawberries with sugar-free whipped topping. Carbohydrate calculation 1. Identify the foods that contain carbohydrates: ? Rice. ? Corn. ? Milk. ? Strawberries. 2. Calculate how many servings you have of each food: ? 2 servings rice. ? 1 serving corn. ? 1 serving milk. ? 1 serving strawberries. 3. Multiply each number of servings by 15 g: ? 2 servings rice x 15 g = 30 g. ? 1 serving corn x 15 g = 15 g. ? 1 serving milk x 15 g = 15 g. ? 1 serving  strawberries x 15 g = 15 g. 4. Add together all of the amounts to find the total grams of carbohydrates eaten: ? 30 g + 15 g + 15 g + 15 g = 75 g of carbohydrates total. Summary  Carbohydrate counting is a method of keeping track of how many carbohydrates you eat.  Eating carbohydrates naturally increases the amount of sugar (glucose) in the blood.  Counting how many carbohydrates you eat helps keep your blood glucose within normal limits, which helps you manage your diabetes.  A diet and nutrition specialist (registered dietitian) can help you make a meal plan and calculate how many carbohydrates you should have at each meal and snack. This information is not intended to replace advice given to you by your health care provider. Make sure you discuss any questions you have with your health care provider. Document Released: 12/31/2004 Document Revised: 07/10/2016 Document Reviewed: 06/14/2015 Elsevier Interactive Patient Education  2019 [employer]  

## 2018-06-29 NOTE — Assessment & Plan Note (Signed)
CMET and Lipid profile today Encouraged her to consume a low fat diet Continue Atorvastatin for now 

## 2018-07-01 NOTE — Telephone Encounter (Signed)
Pt aware of new orders and is scheduled for f/u 07/23/18

## 2018-07-03 DIAGNOSIS — Z20828 Contact with and (suspected) exposure to other viral communicable diseases: Secondary | ICD-10-CM | POA: Diagnosis not present

## 2018-07-08 MED ORDER — TRUE METRIX BLOOD GLUCOSE TEST VI STRP: 1.0000 | ORAL_STRIP | Freq: Four times a day (QID) | 1 refills | Status: AC

## 2018-07-08 NOTE — Addendum Note (Signed)
Addended by: Lurlean Nanny on: 07/08/2018 01:06 PM   Modules accepted: Orders

## 2018-07-21 ENCOUNTER — Other Ambulatory Visit: Payer: Self-pay

## 2018-07-23 ENCOUNTER — Other Ambulatory Visit: Payer: Self-pay

## 2018-07-23 ENCOUNTER — Ambulatory Visit: Payer: Medicare HMO | Admitting: Endocrinology

## 2018-07-23 ENCOUNTER — Encounter: Payer: Self-pay | Admitting: Endocrinology

## 2018-07-23 VITALS — BP 158/80 | HR 81 | Ht 67.0 in | Wt 296.6 lb

## 2018-07-23 DIAGNOSIS — E1165 Type 2 diabetes mellitus with hyperglycemia: Secondary | ICD-10-CM | POA: Diagnosis not present

## 2018-07-23 DIAGNOSIS — E118 Type 2 diabetes mellitus with unspecified complications: Secondary | ICD-10-CM

## 2018-07-23 DIAGNOSIS — IMO0002 Reserved for concepts with insufficient information to code with codable children: Secondary | ICD-10-CM

## 2018-07-23 MED ORDER — INSULIN NPH (HUMAN) (ISOPHANE) 100 UNIT/ML ~~LOC~~ SUSP: 70.0000 [IU] | SUBCUTANEOUS | 11 refills | Status: DC

## 2018-07-23 NOTE — Patient Instructions (Addendum)
Your blood pressure is high today.  Please see your primary care provider soon, to have it rechecked.   Please increase the NPH insulin to 70 units each morning, and stop taking the regular.   On this type of insulin schedule, you should eat meals on a regular schedule.  If a meal is missed or significantly delayed, your blood sugar could go low.   check your blood sugar twice a day.  vary the time of day when you check, between before the 3 meals, and at bedtime.  also check if you have symptoms of your blood sugar being too high or too low.  please keep a record of the readings and bring it to your next appointment here (or you can bring the meter itself).  You can write it on any piece of paper.  please call us sooner if your blood sugar goes below 70, or if you have a lot of readings over 200.   Please come back for a follow-up appointment in 6 weeks.

## 2018-07-23 NOTE — Progress Notes (Signed)
Subjective:    Patient ID: Bethany [name], female    DOB: 28-Jun-1945, 73 y.o.   MRN: [id number]  HPI Pt returns for f/u of diabetes mellitus: DM type: Insulin-requiring type 2 Dx'ed: 1998.  Complications: polyneuropathy.  Therapy: insulin since 2009.  GDM: never DKA: never Severe hypoglycemia: once (2020). Pancreatitis: never. Other: she takes 2 QD insulins, after poor results with multiple daily injections, but she often eats just 2 meals per day; she takes human insulin, due to cost.   Interval history: she takes NPH, 30 units qam, and reg 40 units qam (she reduced a few weeks ago).   Pt had severe hypoglycemia 1 month ago.  Pt says she sometimes misses the insulin.   Past Medical History:  Diagnosis Date  . Arthritis   . Diabetes mellitus without complication ([place])    Type II  . Fibromyalgia   . GERD (gastroesophageal reflux disease)    better after gall bladder surgery-   . Hyperlipidemia   . Hypertension   . Shortness of breath dyspnea    with exertion    Past Surgical History:  Procedure Laterality Date  . ABDOMINAL HYSTERECTOMY  1997  . CATARACT EXTRACTION Bilateral 2014  . CHOLECYSTECTOMY  2010  . COLONOSCOPY    . COLONOSCOPY WITH PROPOFOL N/A 04/21/2015   Procedure: COLONOSCOPY WITH PROPOFOL;  Surgeon: Hulen Luster, MD;  Location: [facility] ENDOSCOPY;  Service: Gastroenterology;  Laterality: N/A;  . EYE SURGERY Bilateral    Cataract  . LUMBAR WOUND DEBRIDEMENT N/A 10/20/2014   Procedure: LUMBAR WOUND DEBRIDEMENT;  Surgeon: Eustace Moore, MD;  Location: [place] NEURO ORS;  Service: Neurosurgery;  Laterality: N/A;  . MAXIMUM ACCESS (MAS)POSTERIOR LUMBAR INTERBODY FUSION (PLIF) 1 LEVEL N/A 09/16/2014   Procedure: L/4-5 FOR MAXIMUM ACCESS (MAS) POSTERIOR LUMBAR INTERBODY FUSION (PLIF) 1 LEVEL;  Surgeon: Eustace Moore, MD;  Location: [facility] NEURO ORS;  Service: Neurosurgery;  Laterality: N/A;  L/4-5 FOR MAXIMUM ACCESS (MAS) POSTERIOR LUMBAR INTERBODY FUSION (PLIF) 1 LEVEL    Social  History   Socioeconomic History  . Marital status: Married    Spouse name: Not on file  . Number of children: Not on file  . Years of education: Not on file  . Highest education level: Not on file  Occupational History  . Not on file  Social Needs  . Financial resource strain: Not on file  . Food insecurity    Worry: Not on file    Inability: Not on file  . Transportation needs    Medical: Not on file    Non-medical: Not on file  Tobacco Use  . Smoking status: Never Smoker  . Smokeless tobacco: Never Used  Substance and Sexual Activity  . Alcohol use: No  . Drug use: No  . Sexual activity: Not Currently  Lifestyle  . Physical activity    Days per week: Not on file    Minutes per session: Not on file  . Stress: Not on file  Relationships  . Social Herbalist on phone: Not on file    Gets together: Not on file    Attends religious service: Not on file    Active member of club or organization: Not on file    Attends meetings of clubs or organizations: Not on file    Relationship status: Not on file  . Intimate partner violence    Fear of current or ex partner: Not on file    Emotionally abused: Not on  file    Physically abused: Not on file    Forced sexual activity: Not on file  Other Topics Concern  . Not on file  Social History Narrative  . Not on file    Current Outpatient Medications on File Prior to Visit  Medication Sig Dispense Refill  . Alcohol Swabs (B-D SINGLE USE SWABS REGULAR) PADS USE TWICE DAILY 200 each 2  . amLODipine (NORVASC) 10 MG tablet TAKE 1 TABLET EVERY DAY 90 tablet 0  . aspirin 81 MG tablet Take 81 mg by mouth daily. Reported on 03/29/2015    . atorvastatin (LIPITOR) 20 MG tablet TAKE 1 TABLET EVERY DAY 90 tablet 0  . Blood Glucose Monitoring Suppl (TRUE METRIX METER) DEVI by Does not apply route.    . docusate sodium (COLACE) 100 MG capsule Take 100 mg by mouth daily as needed for mild constipation. Reported on 03/29/2015    .  fluticasone (FLONASE) 50 MCG/ACT nasal spray Place 2 sprays into both nostrils daily. 16 g 6  . furosemide (LASIX) 20 MG tablet TAKE 1 TABLET EVERY DAY AS NEEDED 90 tablet 0  . glucose blood (TRUE METRIX BLOOD GLUCOSE TEST) test strip 1 each by Other route 4 (four) times daily. 400 each 1  . losartan (COZAAR) 100 MG tablet Take 1 tablet (100 mg total) by mouth daily. 90 tablet 3  . losartan (COZAAR) 50 MG tablet TAKE 1 TABLET EVERY DAY 90 tablet 0  . meloxicam (MOBIC) 15 MG tablet Take 1 tablet (15 mg total) by mouth daily. 90 tablet 0  . metoprolol succinate (TOPROL-XL) 25 MG 24 hr tablet Take 1 tablet (25 mg total) by mouth daily. 90 tablet 2  . naproxen sodium (ANAPROX) 220 MG tablet Take 220 mg by mouth as needed (for pain).     . Omega-3 Fatty Acids (FISH OIL PO) Take 2,400 mg by mouth daily.     Marland Kitchen. omeprazole (PRILOSEC) 20 MG capsule TAKE 1 CAPSULE EVERY DAY 90 capsule 0  . TRUEPLUS LANCETS 33G MISC TEST BLOOD SUGAR TWICE DAILY 200 each 2   Current Facility-Administered Medications on File Prior to Visit  Medication Dose Route Frequency Provider Last Rate Last Dose  . betamethasone acetate-betamethasone sodium phosphate (CELESTONE) injection 3 mg  3 mg Intramuscular Once Felecia ShellingEvans, Brent M, DPM        No Known Allergies  Family History  Problem Relation Age of Onset  . Arthritis Mother   . Cancer Mother        Ovarian  . Diabetes Mother   . Heart disease Father   . Hypertension Father     BP (!) 158/80 (BP Location: Left Arm, Patient Position: Sitting, Cuff Size: Large)   Pulse 81   Ht 5\' 7"  (1.702 m)   Wt 296 lb 9.6 oz (134.5 kg)   SpO2 93%   BMI 46.45 kg/m    Review of Systems No weight change    Objective:   Physical Exam VITAL SIGNS:  See vs page GENERAL: no distress Pulses: foot pulses are intact bilaterally.   MSK: no deformity of the feet or ankles.  CV: 1+ bilat edema of the legs Skin:  no ulcer on the feet or ankles.  normal temp on the feet and ankles.     Neuro: sensation is intact to touch on the feet and ankles, but decreased from normal.   Ext: There is bilateral onychomycosis of the toenails.   Lab Results  Component Value Date   HGBA1C 9.8 (  H) 06/29/2018    Lab Results  Component Value Date   CREATININE 1.05 06/29/2018   BUN 18 06/29/2018   NA 138 06/29/2018   K 3.8 06/29/2018   CL 101 06/29/2018   CO2 30 06/29/2018      Assessment & Plan:  Insulin-requiring type 2 DM, with PN: poor glycemic control. Noncompliance with cbg recording, insulin, and f/u ov's.  She needs a simpler regimen.  Hypoglycemia, worse: this limits aggressiveness of glycemic control.   HTN: is noted today.   Patient Instructions  Your blood pressure is high today.  Please see your primary care provider soon, to have it rechecked.   Please increase the NPH insulin to 70 units each morning, and stop taking the regular.   On this type of insulin schedule, you should eat meals on a regular schedule.  If a meal is missed or significantly delayed, your blood sugar could go low.   check your blood sugar twice a day.  vary the time of day when you check, between before the 3 meals, and at bedtime.  also check if you have symptoms of your blood sugar being too high or too low.  please keep a record of the readings and bring it to your next appointment here (or you can bring the meter itself).  You can write it on any piece of paper.  please call us sooner if your blood sugar goes below 70, or if you have a lot of readings over 200.   Please come back for a follow-up appointment in 6 weeks.

## 2018-07-24 ENCOUNTER — Ambulatory Visit (INDEPENDENT_AMBULATORY_CARE_PROVIDER_SITE_OTHER): Payer: Medicare HMO

## 2018-07-24 ENCOUNTER — Ambulatory Visit: Payer: Medicare Other | Admitting: Podiatry

## 2018-07-24 VITALS — Temp 98.2°F

## 2018-07-24 DIAGNOSIS — M659 Synovitis and tenosynovitis, unspecified: Secondary | ICD-10-CM

## 2018-07-24 DIAGNOSIS — M79676 Pain in unspecified toe(s): Secondary | ICD-10-CM

## 2018-07-24 DIAGNOSIS — E0843 Diabetes mellitus due to underlying condition with diabetic autonomic (poly)neuropathy: Secondary | ICD-10-CM

## 2018-07-24 DIAGNOSIS — M65979 Unspecified synovitis and tenosynovitis, unspecified ankle and foot: Secondary | ICD-10-CM

## 2018-07-24 DIAGNOSIS — B351 Tinea unguium: Secondary | ICD-10-CM

## 2018-07-24 MED ORDER — GABAPENTIN 100 MG PO CAPS: 100.0000 mg | ORAL_CAPSULE | Freq: Three times a day (TID) | ORAL | 3 refills | Status: DC

## 2018-07-27 NOTE — Progress Notes (Signed)
   SUBJECTIVE Patient with a history of diabetes mellitus presents to office today with a chief complaint of bilateral ankle pain that began a few months ago. She reports associated swelling. Walking increases the pain. She has not had any treatment for her symptoms.  She also is complaining of elongated, thickened nails that cause pain while ambulating in shoes. She is unable to trim her own nails. Patient is here for further evaluation and treatment.   Past Medical History:  Diagnosis Date  . Arthritis   . Diabetes mellitus without complication ([place])    Type II  . Fibromyalgia   . GERD (gastroesophageal reflux disease)    better after gall bladder surgery-   . Hyperlipidemia   . Hypertension   . Shortness of breath dyspnea    with exertion    OBJECTIVE General Patient is awake, alert, and oriented x 3 and in no acute distress. Derm Skin is dry and supple bilateral. Negative open lesions or macerations. Remaining integument unremarkable. Nails are tender, long, thickened and dystrophic with subungual debris, consistent with onychomycosis, 1-5 bilateral. No signs of infection noted. Vasc  DP and PT pedal pulses palpable bilaterally. Temperature gradient within normal limits.  Neuro Epicritic and protective threshold sensation diminished bilaterally.  Musculoskeletal Exam Pain with palpation noted to the anterior, lateral and medial aspects of the bilateral ankle joints. No symptomatic pedal deformities noted bilateral. Muscular strength within normal limits.  Radiographic Exam:  Normal osseous mineralization. Joint spaces preserved. No fracture/dislocation/boney destruction.    ASSESSMENT 1. Diabetes Mellitus w/ peripheral neuropathy 2. Onychomycosis of nail due to dermatophyte bilateral 3. Synovitis / DJD bilateral ankles  PLAN OF CARE 1. Patient evaluated today. X-Rays reviewed.  2. Instructed to maintain good pedal hygiene and foot care. Stressed importance of controlling  blood sugar.  3. Mechanical debridement of nails 1-5 bilaterally performed using a nail nipper. Filed with dremel without incident.  4. Injection of 0.5 mLs Celestone Soluspan injected into the bilateral ankle joints.  5. Prescription for Gabapentin 100 mg TID provided to patient.  6. Return to clinic as needed.     Edrick Kins, DPM [facility]  Dr. Edrick Kins, [address]                                        [place], [place] [address]                Office [phone]  Fax [phone]

## 2018-08-05 ENCOUNTER — Other Ambulatory Visit: Payer: Medicare HMO | Admitting: Orthotics

## 2018-08-27 DIAGNOSIS — Z9842 Cataract extraction status, left eye: Secondary | ICD-10-CM | POA: Diagnosis not present

## 2018-08-27 DIAGNOSIS — Z9841 Cataract extraction status, right eye: Secondary | ICD-10-CM | POA: Diagnosis not present

## 2018-08-27 DIAGNOSIS — E119 Type 2 diabetes mellitus without complications: Secondary | ICD-10-CM | POA: Diagnosis not present

## 2018-08-27 DIAGNOSIS — H5203 Hypermetropia, bilateral: Secondary | ICD-10-CM | POA: Diagnosis not present

## 2018-09-02 ENCOUNTER — Other Ambulatory Visit: Payer: Self-pay

## 2018-09-04 ENCOUNTER — Other Ambulatory Visit: Payer: Self-pay

## 2018-09-04 ENCOUNTER — Encounter: Payer: Self-pay | Admitting: Endocrinology

## 2018-09-04 ENCOUNTER — Ambulatory Visit: Payer: Medicare HMO | Admitting: Endocrinology

## 2018-09-04 VITALS — BP 158/90 | HR 89 | Ht 67.0 in | Wt 308.0 lb

## 2018-09-04 DIAGNOSIS — E1165 Type 2 diabetes mellitus with hyperglycemia: Secondary | ICD-10-CM

## 2018-09-04 DIAGNOSIS — E118 Type 2 diabetes mellitus with unspecified complications: Secondary | ICD-10-CM | POA: Diagnosis not present

## 2018-09-04 DIAGNOSIS — IMO0002 Reserved for concepts with insufficient information to code with codable children: Secondary | ICD-10-CM

## 2018-09-04 LAB — GLUCOSE, POCT (MANUAL RESULT ENTRY): POC Glucose: 86 mg/dl (ref 70–99)

## 2018-09-04 LAB — POCT GLYCOSYLATED HEMOGLOBIN (HGB A1C): Hemoglobin A1C: 7.8 % — AB (ref 4.0–5.6)

## 2018-09-04 MED ORDER — INSULIN NPH (HUMAN) (ISOPHANE) 100 UNIT/ML ~~LOC~~ SUSP: 65.0000 [IU] | SUBCUTANEOUS | 11 refills | Status: DC

## 2018-09-04 NOTE — Progress Notes (Signed)
Subjective:    Patient ID: Bethany [name], female    DOB: April 06, 1945, 73 y.o.   MRN: [id number]  HPI Pt returns for f/u of diabetes mellitus: DM type: Insulin-requiring type 2 Dx'ed: 1998.  Complications: polyneuropathy.  Therapy: insulin since 2009.  GDM: never DKA: never Severe hypoglycemia: once (2020). Pancreatitis: never. Other: she takes QD insulin, after poor results with multiple daily injections, but she often eats just 2 meals per day; she takes human insulin, due to cost.   Interval history: Pt has mild hypoglycemia approx once per week.  Pt says she has not recently missed the insulin.  she brings has meter with her cbg's which I have reviewed today.  glucose varies from 54-274.  Glucose is lowest when a meal is missed. She got steroid injection into the ankles approx 6 weeks ago.   Past Medical History:  Diagnosis Date  . Arthritis   . Diabetes mellitus without complication ([facility])    Type II  . Fibromyalgia   . GERD (gastroesophageal reflux disease)    better after gall bladder surgery-   . Hyperlipidemia   . Hypertension   . Shortness of breath dyspnea    with exertion    Past Surgical History:  Procedure Laterality Date  . ABDOMINAL HYSTERECTOMY  1997  . CATARACT EXTRACTION Bilateral 2014  . CHOLECYSTECTOMY  2010  . COLONOSCOPY    . COLONOSCOPY WITH PROPOFOL N/A 04/21/2015   Procedure: COLONOSCOPY WITH PROPOFOL;  Surgeon: Wallace CullensPaul Y Oh, MD;  [facility];  Service: Gastroenterology;  Laterality: N/A;  . EYE SURGERY Bilateral    Cataract  . LUMBAR WOUND DEBRIDEMENT N/A 10/20/2014   Procedure: LUMBAR WOUND DEBRIDEMENT;  Surgeon: Tia Alertavid S Jones, MD;  Location: [facility];  Service: Neurosurgery;  Laterality: N/A;  . MAXIMUM ACCESS (MAS)POSTERIOR LUMBAR INTERBODY FUSION (PLIF) 1 LEVEL N/A 09/16/2014   Procedure: L/4-5 FOR MAXIMUM ACCESS (MAS) POSTERIOR LUMBAR INTERBODY FUSION (PLIF) 1 LEVEL;  Surgeon: Tia Alertavid S Jones, MD;  Location: [facility];  Service:  Neurosurgery;  Laterality: N/A;  L/4-5 FOR MAXIMUM ACCESS (MAS) POSTERIOR LUMBAR INTERBODY FUSION (PLIF) 1 LEVEL    Social History   Socioeconomic History  . Marital status: Married    Spouse name: Not on file  . Number of children: Not on file  . Years of education: Not on file  . Highest education level: Not on file  Occupational History  . Not on file  Social Needs  . Financial resource strain: Not on file  . Food insecurity    Worry: Not on file    Inability: Not on file  . Transportation needs    Medical: Not on file    Non-medical: Not on file  Tobacco Use  . Smoking status: Never Smoker  . Smokeless tobacco: Never Used  Substance and Sexual Activity  . Alcohol use: No  . Drug use: No  . Sexual activity: Not Currently  Lifestyle  . Physical activity    Days per week: Not on file    Minutes per session: Not on file  . Stress: Not on file  Relationships  . Social Musicianconnections    Talks on phone: Not on file    Gets together: Not on file    Attends religious service: Not on file    Active member of club or organization: Not on file    Attends meetings of clubs or organizations: Not on file    Relationship status: Not on file  . Intimate  partner violence    Fear of current or ex partner: Not on file    Emotionally abused: Not on file    Physically abused: Not on file    Forced sexual activity: Not on file  Other Topics Concern  . Not on file  Social History Narrative  . Not on file    Current Outpatient Medications on File Prior to Visit  Medication Sig Dispense Refill  . Alcohol Swabs (B-D SINGLE USE SWABS REGULAR) PADS USE TWICE DAILY 200 each 2  . amLODipine (NORVASC) 10 MG tablet TAKE 1 TABLET EVERY DAY 90 tablet 0  . aspirin 81 MG tablet Take 81 mg by mouth daily. Reported on 03/29/2015    . atorvastatin (LIPITOR) 20 MG tablet TAKE 1 TABLET EVERY DAY 90 tablet 0  . Blood Glucose Monitoring Suppl (TRUE METRIX METER) DEVI by Does not apply route.    .  docusate sodium (COLACE) 100 MG capsule Take 100 mg by mouth daily as needed for mild constipation. Reported on 03/29/2015    . fluticasone (FLONASE) 50 MCG/ACT nasal spray Place 2 sprays into both nostrils daily. 16 g 6  . furosemide (LASIX) 20 MG tablet TAKE 1 TABLET EVERY DAY AS NEEDED 90 tablet 0  . gabapentin (NEURONTIN) 100 MG capsule Take 1 capsule (100 mg total) by mouth 3 (three) times daily. 90 capsule 3  . glucose blood (TRUE METRIX BLOOD GLUCOSE TEST) test strip 1 each by Other route 4 (four) times daily. 400 each 1  . losartan (COZAAR) 100 MG tablet Take 1 tablet (100 mg total) by mouth daily. 90 tablet 3  . losartan (COZAAR) 50 MG tablet TAKE 1 TABLET EVERY DAY 90 tablet 0  . meloxicam (MOBIC) 15 MG tablet Take 1 tablet (15 mg total) by mouth daily. 90 tablet 0  . metoprolol succinate (TOPROL-XL) 25 MG 24 hr tablet Take 1 tablet (25 mg total) by mouth daily. 90 tablet 2  . naproxen sodium (ANAPROX) 220 MG tablet Take 220 mg by mouth as needed (for pain).     . Omega-3 Fatty Acids (FISH OIL PO) Take 2,400 mg by mouth daily.     Marland Kitchen omeprazole (PRILOSEC) 20 MG capsule TAKE 1 CAPSULE EVERY DAY 90 capsule 0  . TRUEPLUS LANCETS 33G MISC TEST BLOOD SUGAR TWICE DAILY 200 each 2   Current Facility-Administered Medications on File Prior to Visit  Medication Dose Route Frequency Provider Last Rate Last Dose  . betamethasone acetate-betamethasone sodium phosphate (CELESTONE) injection 3 mg  3 mg Intramuscular Once Edrick Kins, DPM        No Known Allergies  Family History  Problem Relation Age of Onset  . Arthritis Mother   . Cancer Mother        Ovarian  . Diabetes Mother   . Heart disease Father   . Hypertension Father     BP (!) 158/90 (BP Location: Right Wrist, Patient Position: Sitting, Cuff Size: Large)   Pulse 89   Ht 5\' 7"  (1.702 m)   Wt (!) 308 lb (139.7 kg)   SpO2 98%   BMI 48.24 kg/m    Review of Systems Denies LOC    Objective:   Physical Exam VITAL SIGNS:   See vs page GENERAL: no distress Pulses: foot pulses are intact bilaterally.   MSK: no deformity of the feet or ankles.  CV: trace bilat edema of the legs Skin:  no ulcer on the feet or ankles.  normal temp on the feet and  ankles.  Neuro: sensation is intact to touch on the feet and ankles, but decreased from normal.   Ext: There is bilateral onychomycosis of the toenails.     Lab Results  Component Value Date   HGBA1C 7.8 (A) 09/04/2018       Assessment & Plan:  HTN: is noted today Insulin-requiring type 2 DM, with PN: this is the best control this pt should aim for, given this regimen, which does match insulin to her changing needs throughout the day Hypoglycemia: this limits aggressiveness of glycemic control Ankle pain: steroid rx is likely affecting a1c  Patient Instructions  Your blood pressure is high today.  Please see your primary care provider soon, to have it rechecked.   Please reduce the NPH insulin to 65 units each morning.   On this type of insulin schedule, you should eat meals on a regular schedule.  If a meal is missed or significantly delayed, your blood sugar could go low.   check your blood sugar twice a day.  vary the time of day when you check, between before the 3 meals, and at bedtime.  also check if you have symptoms of your blood sugar being too high or too low.  please keep a record of the readings and bring it to your next appointment here (or you can bring the meter itself).  You can write it on any piece of paper.  please call us sooner if your blood sugar goes below 70, or if you have a lot of readings over 200.   Please come back for a follow-up appointment in 2 months.

## 2018-09-04 NOTE — Progress Notes (Signed)
Pt states she was in the parking lot and on time for her appt. However, she states she had experienced a hypoglycemic episode while in the car. C/o diaphoresis, fatigue and dizziness. Further added her CBG was in the 80's this morning but took her insulin regardless. States a passerby saw her calling out in the car and gave her a soda. States she "felt better" then came in. CBG was checked per nursing judgement and is now 64. Dr. Loanne Drilling made aware.

## 2018-09-04 NOTE — Patient Instructions (Addendum)
Your blood pressure is high today.  Please see your primary care provider soon, to have it rechecked.   Please reduce the NPH insulin to 65 units each morning.   On this type of insulin schedule, you should eat meals on a regular schedule.  If a meal is missed or significantly delayed, your blood sugar could go low.   check your blood sugar twice a day.  vary the time of day when you check, between before the 3 meals, and at bedtime.  also check if you have symptoms of your blood sugar being too high or too low.  please keep a record of the readings and bring it to your next appointment here (or you can bring the meter itself).  You can write it on any piece of paper.  please call us sooner if your blood sugar goes below 70, or if you have a lot of readings over 200.   Please come back for a follow-up appointment in 2 months.

## 2018-10-25 ENCOUNTER — Other Ambulatory Visit: Payer: Self-pay | Admitting: Endocrinology

## 2018-10-28 ENCOUNTER — Other Ambulatory Visit: Payer: Self-pay | Admitting: Internal Medicine

## 2018-10-28 DIAGNOSIS — K219 Gastro-esophageal reflux disease without esophagitis: Secondary | ICD-10-CM

## 2018-10-28 DIAGNOSIS — E7800 Pure hypercholesterolemia, unspecified: Secondary | ICD-10-CM

## 2018-10-28 DIAGNOSIS — I10 Essential (primary) hypertension: Secondary | ICD-10-CM

## 2018-11-02 ENCOUNTER — Other Ambulatory Visit: Payer: Self-pay

## 2018-11-04 ENCOUNTER — Other Ambulatory Visit: Payer: Self-pay

## 2018-11-04 ENCOUNTER — Ambulatory Visit: Payer: Medicare HMO | Admitting: Endocrinology

## 2018-11-04 ENCOUNTER — Encounter: Payer: Self-pay | Admitting: Endocrinology

## 2018-11-04 VITALS — BP 138/80 | HR 100 | Ht 67.0 in | Wt 305.0 lb

## 2018-11-04 DIAGNOSIS — E118 Type 2 diabetes mellitus with unspecified complications: Secondary | ICD-10-CM

## 2018-11-04 DIAGNOSIS — IMO0002 Reserved for concepts with insufficient information to code with codable children: Secondary | ICD-10-CM

## 2018-11-04 DIAGNOSIS — E1165 Type 2 diabetes mellitus with hyperglycemia: Secondary | ICD-10-CM

## 2018-11-04 LAB — POCT GLYCOSYLATED HEMOGLOBIN (HGB A1C): Hemoglobin A1C: 7.8 % — AB (ref 4.0–5.6)

## 2018-11-04 MED ORDER — INSULIN NPH (HUMAN) (ISOPHANE) 100 UNIT/ML ~~LOC~~ SUSP: 60.0000 [IU] | SUBCUTANEOUS | 0 refills | Status: DC

## 2018-11-04 MED ORDER — TRIAMCINOLONE ACETONIDE 0.1 % EX CREA: 1.0000 "application " | TOPICAL_CREAM | Freq: Four times a day (QID) | CUTANEOUS | 2 refills | Status: DC

## 2018-11-04 NOTE — Progress Notes (Signed)
Subjective:    Patient ID: Bethany [name], female    DOB: Aug 10, 1945, 73 y.o.   MRN: [id number]  HPI Pt returns for f/u of diabetes mellitus: DM type: Insulin-requiring type 2 Dx'ed: 1998.  Complications: polyneuropathy.  Therapy: insulin since 2009.  GDM: never DKA: never Severe hypoglycemia: once (2020). Pancreatitis: never. Other: she takes QD insulin, after poor results with multiple daily injections, but she often eats just 2 meals per day; she takes human insulin, due to cost.   Interval history: Pt still has mild hypoglycemia approx once per week.  Pt says she has not recently missed the insulin.  no cbg record, but states cbg varies from 57-320.  It is in general higher as the day goes on.  She takes 65 units qam No recent steroids. Past Medical History:  Diagnosis Date  . Arthritis   . Diabetes mellitus without complication ([facility])    Type II  . Fibromyalgia   . GERD (gastroesophageal reflux disease)    better after gall bladder surgery-   . Hyperlipidemia   . Hypertension   . Shortness of breath dyspnea    with exertion    Past Surgical History:  Procedure Laterality Date  . ABDOMINAL HYSTERECTOMY  1997  . CATARACT EXTRACTION Bilateral 2014  . CHOLECYSTECTOMY  2010  . COLONOSCOPY    . COLONOSCOPY WITH PROPOFOL N/A 04/21/2015   Procedure: COLONOSCOPY WITH PROPOFOL;  Surgeon: Wallace CullensPaul Y Oh, MD;  [facility];  Service: Gastroenterology;  Laterality: N/A;  . EYE SURGERY Bilateral    Cataract  . LUMBAR WOUND DEBRIDEMENT N/A 10/20/2014   Procedure: LUMBAR WOUND DEBRIDEMENT;  Surgeon: Tia Alertavid S Jones, MD;  Location: [facility];  Service: Neurosurgery;  Laterality: N/A;  . MAXIMUM ACCESS (MAS)POSTERIOR LUMBAR INTERBODY FUSION (PLIF) 1 LEVEL N/A 09/16/2014   Procedure: L/4-5 FOR MAXIMUM ACCESS (MAS) POSTERIOR LUMBAR INTERBODY FUSION (PLIF) 1 LEVEL;  Surgeon: Tia Alertavid S Jones, MD;  Location: [facility];  Service: Neurosurgery;  Laterality: N/A;  L/4-5 FOR MAXIMUM ACCESS  (MAS) POSTERIOR LUMBAR INTERBODY FUSION (PLIF) 1 LEVEL    Social History   Socioeconomic History  . Marital status: Married    Spouse name: Not on file  . Number of children: Not on file  . Years of education: Not on file  . Highest education level: Not on file  Occupational History  . Not on file  Social Needs  . Financial resource strain: Not on file  . Food insecurity    Worry: Not on file    Inability: Not on file  . Transportation needs    Medical: Not on file    Non-medical: Not on file  Tobacco Use  . Smoking status: Never Smoker  . Smokeless tobacco: Never Used  Substance and Sexual Activity  . Alcohol use: No  . Drug use: No  . Sexual activity: Not Currently  Lifestyle  . Physical activity    Days per week: Not on file    Minutes per session: Not on file  . Stress: Not on file  Relationships  . Social Musicianconnections    Talks on phone: Not on file    Gets together: Not on file    Attends religious service: Not on file    Active member of club or organization: Not on file    Attends meetings of clubs or organizations: Not on file    Relationship status: Not on file  . Intimate partner violence    Fear of current or  ex partner: Not on file    Emotionally abused: Not on file    Physically abused: Not on file    Forced sexual activity: Not on file  Other Topics Concern  . Not on file  Social History Narrative  . Not on file    Current Outpatient Medications on File Prior to Visit  Medication Sig Dispense Refill  . Alcohol Swabs (B-D SINGLE USE SWABS REGULAR) PADS USE TWICE DAILY 200 each 2  . amLODipine (NORVASC) 10 MG tablet TAKE 1 TABLET EVERY DAY 90 tablet 0  . aspirin 81 MG tablet Take 81 mg by mouth daily. Reported on 03/29/2015    . atorvastatin (LIPITOR) 20 MG tablet TAKE 1 TABLET EVERY DAY 90 tablet 0  . Blood Glucose Monitoring Suppl (TRUE METRIX METER) DEVI by Does not apply route.    . docusate sodium (COLACE) 100 MG capsule Take 100 mg by mouth  daily as needed for mild constipation. Reported on 03/29/2015    . fluticasone (FLONASE) 50 MCG/ACT nasal spray Place 2 sprays into both nostrils daily. 16 g 6  . furosemide (LASIX) 20 MG tablet TAKE 1 TABLET EVERY DAY AS NEEDED 90 tablet 0  . gabapentin (NEURONTIN) 100 MG capsule Take 1 capsule (100 mg total) by mouth 3 (three) times daily. 90 capsule 3  . glucose blood (TRUE METRIX BLOOD GLUCOSE TEST) test strip 1 each by Other route 4 (four) times daily. 400 each 1  . losartan (COZAAR) 100 MG tablet Take 1 tablet (100 mg total) by mouth daily. 90 tablet 3  . losartan (COZAAR) 50 MG tablet TAKE 1 TABLET EVERY DAY 90 tablet 0  . meloxicam (MOBIC) 15 MG tablet Take 1 tablet (15 mg total) by mouth daily. 90 tablet 0  . metoprolol succinate (TOPROL-XL) 25 MG 24 hr tablet Take 1 tablet (25 mg total) by mouth daily. 90 tablet 0  . naproxen sodium (ANAPROX) 220 MG tablet Take 220 mg by mouth as needed (for pain).     . Omega-3 Fatty Acids (FISH OIL PO) Take 2,400 mg by mouth daily.     Marland Kitchen omeprazole (PRILOSEC) 20 MG capsule TAKE 1 CAPSULE EVERY DAY 90 capsule 0  . TRUEPLUS LANCETS 33G MISC TEST BLOOD SUGAR TWICE DAILY 200 each 2   Current Facility-Administered Medications on File Prior to Visit  Medication Dose Route Frequency Provider Last Rate Last Dose  . betamethasone acetate-betamethasone sodium phosphate (CELESTONE) injection 3 mg  3 mg Intramuscular Once Felecia Shelling, DPM        No Known Allergies  Family History  Problem Relation Age of Onset  . Arthritis Mother   . Cancer Mother        Ovarian  . Diabetes Mother   . Heart disease Father   . Hypertension Father     BP 138/80 (BP Location: Left Wrist, Patient Position: Sitting, Cuff Size: Large)   Pulse 100   Ht 5\' 7"  (1.702 m)   Wt (!) 305 lb (138.3 kg)   SpO2 94%   BMI 47.77 kg/m    Review of Systems Denies LOC.  She has itching of the legs    Objective:   Physical Exam VITAL SIGNS:  See vs page GENERAL: no distress  Pulses: dorsalis pedis intact bilat.   MSK: no deformity of the feet CV: 1+ bilat leg edema Skin:  no ulcer on the feet.  normal color and temp on the feet. Neuro: sensation is intact to touch on the feet, but  decreased from normal Ext: there is bilateral onychomycosis of the toenails  A1c=7.8%     Assessment & Plan:  Insulin-requiring type 2 DM: this is the best control this pt should aim for, given this regimen, which does match insulin to her changing needs throughout the day Hypoglycemia: this limits aggressiveness of glycemic control.  Polyneuropathy: we discussed rx.  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   On this type of insulin schedule, you should eat meals on a regular schedule.  If a meal is missed or significantly delayed, your blood sugar could go low.   check your blood sugar twice a day.  vary the time of day when you check, between before the 3 meals, and at bedtime.  also check if you have symptoms of your blood sugar being too high or too low.  please keep a record of the readings and bring it to your next appointment here (or you can bring the meter itself).  You can write it on any piece of paper.  please call us sooner if your blood sugar goes below 70, or if you have a lot of readings over 200.   Some specialists say that taking a high amount of folic acid (4-5 mg per day) helps the neuropathy.   I have sent a prescription to your pharmacy, for anti-itch cream. Please come back for a follow-up appointment in 3 months.

## 2018-11-04 NOTE — Patient Instructions (Addendum)
Please reduce the NPH insulin to 60 units each morning.   On this type of insulin schedule, you should eat meals on a regular schedule.  If a meal is missed or significantly delayed, your blood sugar could go low.   check your blood sugar twice a day.  vary the time of day when you check, between before the 3 meals, and at bedtime.  also check if you have symptoms of your blood sugar being too high or too low.  please keep a record of the readings and bring it to your next appointment here (or you can bring the meter itself).  You can write it on any piece of paper.  please call us sooner if your blood sugar goes below 70, or if you have a lot of readings over 200.   Some specialists say that taking a high amount of folic acid (4-5 mg per day) helps the neuropathy.   I have sent a prescription to your pharmacy, for anti-itch cream. Please come back for a follow-up appointment in 3 months.

## 2018-12-22 ENCOUNTER — Encounter: Payer: Self-pay | Admitting: Internal Medicine

## 2018-12-22 ENCOUNTER — Ambulatory Visit (INDEPENDENT_AMBULATORY_CARE_PROVIDER_SITE_OTHER): Payer: Medicare HMO | Admitting: Internal Medicine

## 2018-12-22 ENCOUNTER — Other Ambulatory Visit: Payer: Self-pay

## 2018-12-22 VITALS — BP 142/90 | HR 79 | Temp 97.4°F | Ht 67.0 in | Wt 305.0 lb

## 2018-12-22 DIAGNOSIS — E118 Type 2 diabetes mellitus with unspecified complications: Secondary | ICD-10-CM

## 2018-12-22 DIAGNOSIS — K219 Gastro-esophageal reflux disease without esophagitis: Secondary | ICD-10-CM

## 2018-12-22 DIAGNOSIS — E7800 Pure hypercholesterolemia, unspecified: Secondary | ICD-10-CM | POA: Diagnosis not present

## 2018-12-22 DIAGNOSIS — Z1231 Encounter for screening mammogram for malignant neoplasm of breast: Secondary | ICD-10-CM | POA: Diagnosis not present

## 2018-12-22 DIAGNOSIS — M797 Fibromyalgia: Secondary | ICD-10-CM | POA: Diagnosis not present

## 2018-12-22 DIAGNOSIS — I5032 Chronic diastolic (congestive) heart failure: Secondary | ICD-10-CM | POA: Diagnosis not present

## 2018-12-22 DIAGNOSIS — M1990 Unspecified osteoarthritis, unspecified site: Secondary | ICD-10-CM | POA: Diagnosis not present

## 2018-12-22 DIAGNOSIS — I10 Essential (primary) hypertension: Secondary | ICD-10-CM | POA: Diagnosis not present

## 2018-12-22 DIAGNOSIS — E1165 Type 2 diabetes mellitus with hyperglycemia: Secondary | ICD-10-CM

## 2018-12-22 DIAGNOSIS — Z780 Asymptomatic menopausal state: Secondary | ICD-10-CM

## 2018-12-22 DIAGNOSIS — Z0000 Encounter for general adult medical examination without abnormal findings: Secondary | ICD-10-CM

## 2018-12-22 DIAGNOSIS — IMO0002 Reserved for concepts with insufficient information to code with codable children: Secondary | ICD-10-CM

## 2018-12-22 NOTE — Assessment & Plan Note (Signed)
Continue Omeprazole CMET today Discussed how weight loss and avoiding triggers can help reduce her symptoms

## 2018-12-22 NOTE — Assessment & Plan Note (Signed)
CMET today Lipid profile reviewed Encouraged her to consume a low fat diet Continue Atorvastatin and Fish Oil

## 2018-12-22 NOTE — Assessment & Plan Note (Signed)
Too soon for A1C No urine microalbumin secondary to ARB therapy Encouraged her to consume a low carb diet and exercise for weight loss Continue Novolin Foot exam by endocrinology Encouraged yearly eye exam Flu and prevnar UTD Pneumovax due in 1 year

## 2018-12-22 NOTE — Progress Notes (Signed)
HPI:  Pt presents to the clinic today for her annual subsequent Medicare Wellness Exam. She is also due to follow up chronic conditions.  Arthritis: Mainly in her back and knees. She takes Aleve and Bengay as needed with some relief.  DM 2: [name] was 7.8%, 10/2018. Her sugars range 65-350. She is taking Novolin as prescribed. She checks her feet routinely. She follows with Dr. Everardo AllEllison.  CHF: Compensated. She takes Amlodipine, Losartan and Lasix as prescribed. Echo from 10/2017 reviewed.  Fibromyalgia: She reports generalized pain all the time. She takes Aleve daily with some relief.   GERD: She is not sure what triggers this. She takes Omeprazole as prescribed and  Maalox OTC as needed with good relief. There is no upper GI on file.  HLD: [name] was 59, 06/2018. She denies myalgias on Atorvastatin and Fish Oil. She tries to consume a low fat diet.  HTN: Her BP today is 142/90. She is taking Metoprolol, Amlodipine, Lasix and Losartan as prescribed. ECG from 02/2018 reviewed.  Past Medical History:  Diagnosis Date  . Arthritis   . Diabetes mellitus without complication (HCC)    Type II  . Fibromyalgia   . GERD (gastroesophageal reflux disease)    better after gall bladder surgery-   . Hyperlipidemia   . Hypertension   . Shortness of breath dyspnea    with exertion    Current Outpatient Medications  Medication Sig Dispense Refill  . Alcohol Swabs (B-D SINGLE USE SWABS REGULAR) PADS USE TWICE DAILY 200 each 2  . amLODipine (NORVASC) 10 MG tablet TAKE 1 TABLET EVERY DAY 90 tablet 0  . aspirin 81 MG tablet Take 81 mg by mouth daily. Reported on 03/29/2015    . atorvastatin (LIPITOR) 20 MG tablet TAKE 1 TABLET EVERY DAY 90 tablet 0  . Blood Glucose Monitoring Suppl (TRUE METRIX METER) DEVI by Does not apply route.    . docusate sodium (COLACE) 100 MG capsule Take 100 mg by mouth daily as needed for mild constipation. Reported on 03/29/2015    . fluticasone (FLONASE) 50  MCG/ACT nasal spray Place 2 sprays into both nostrils daily. 16 g 6  . furosemide (LASIX) 20 MG tablet TAKE 1 TABLET EVERY DAY AS NEEDED 90 tablet 0  . gabapentin (NEURONTIN) 100 MG capsule Take 1 capsule (100 mg total) by mouth 3 (three) times daily. 90 capsule 3  . glucose blood (TRUE METRIX BLOOD GLUCOSE TEST) test strip 1 each by Other route 4 (four) times daily. 400 each 1  . insulin NPH Human (NOVOLIN N RELION) 100 UNIT/ML injection Inject 0.6 mLs (60 Units total) into the skin every morning. 20 mL 0  . losartan (COZAAR) 100 MG tablet Take 1 tablet (100 mg total) by mouth daily. 90 tablet 3  . losartan (COZAAR) 50 MG tablet TAKE 1 TABLET EVERY DAY 90 tablet 0  . meloxicam (MOBIC) 15 MG tablet Take 1 tablet (15 mg total) by mouth daily. 90 tablet 0  . metoprolol succinate (TOPROL-XL) 25 MG 24 hr tablet Take 1 tablet (25 mg total) by mouth daily. 90 tablet 0  . naproxen sodium (ANAPROX) 220 MG tablet Take 220 mg by mouth as needed (for pain).     . Omega-3 Fatty Acids (FISH OIL PO) Take 2,400 mg by mouth daily.     Marland Kitchen. omeprazole (PRILOSEC) 20 MG capsule TAKE 1 CAPSULE EVERY DAY 90 capsule 0  . triamcinolone cream (KENALOG) 0.1 % Apply 1 application topically 4 (four)  times daily. As needed for itching 80 g 2  . TRUEPLUS LANCETS 33G MISC TEST BLOOD SUGAR TWICE DAILY 200 each 2   Current Facility-Administered Medications  Medication Dose Route Frequency Provider Last Rate Last Dose  . betamethasone acetate-betamethasone sodium phosphate (CELESTONE) injection 3 mg  3 mg Intramuscular Once Edrick Kins, DPM        No Known Allergies  Family History  Problem Relation Age of Onset  . Arthritis Mother   . Cancer Mother        Ovarian  . Diabetes Mother   . Heart disease Father   . Hypertension Father     Social History   Socioeconomic History  . Marital status: Married    Spouse name: Not on file  . Number of children: Not on file  . Years of education: Not on file  . Highest  education level: Not on file  Occupational History  . Not on file  Social Needs  . Financial resource strain: Not on file  . Food insecurity    Worry: Not on file    Inability: Not on file  . Transportation needs    Medical: Not on file    Non-medical: Not on file  Tobacco Use  . Smoking status: Never Smoker  . Smokeless tobacco: Never Used  Substance and Sexual Activity  . Alcohol use: No  . Drug use: No  . Sexual activity: Not Currently  Lifestyle  . Physical activity    Days per week: Not on file    Minutes per session: Not on file  . Stress: Not on file  Relationships  . Social Herbalist on phone: Not on file    Gets together: Not on file    Attends religious service: Not on file    Active member of club or organization: Not on file    Attends meetings of clubs or organizations: Not on file    Relationship status: Not on file  . Intimate partner violence    Fear of current or ex partner: Not on file    Emotionally abused: Not on file    Physically abused: Not on file    Forced sexual activity: Not on file  Other Topics Concern  . Not on file  Social History Narrative  . Not on file    Hospitiliaztions: None  Health Maintenance:    Flu: 12/2018  Tetanus: unsure  Pneumovax: never  Prevnar: 12/2018  Zostavax: never  Shingrix: never  Mammogram: 06/2014  Pap Smear: no longer screening  Bone Density: > 5 years ago  Colon Screening: 04/2015  Eye Doctor: annually  Dental Exam: biannually   Providers:   PCP: Webb Silversmith, NP-C  Endocrinologist: Dr. Loanne Drilling   I have personally reviewed and have noted:  1. The patient's medical and social history 2. Their use of alcohol, tobacco or illicit drugs 3. Their current medications and supplements 4. The patient's functional ability including ADL's, fall risks, home safety risks and hearing or visual impairment. 5. Diet and physical activities 6. Evidence for depression or mood disorder  Subjective:    Review of Systems:   Constitutional: Pt reports weight gain. Denies fever, malaise, fatigue, headache.  HEENT: Denies eye pain, eye redness, ear pain, ringing in the ears, wax buildup, runny nose, nasal congestion, bloody nose, or sore throat. Respiratory: Pt reports wheezing. Denies difficulty breathing, shortness of breath, cough or sputum production.   Cardiovascular: Pt reports swelling in legs. Denies chest pain,  chest tightness, palpitations or swelling in the hands Gastrointestinal: Denies abdominal pain, bloating, constipation, diarrhea or blood in the stool.  GU: Denies urgency, frequency, pain with urination, burning sensation, blood in urine, odor or discharge. Musculoskeletal: Pt reports chronic joint and muscle pain. Denies decrease in range of motion, difficulty with gait, or joint  swelling.  Skin: Denies redness, rashes, lesions or ulcercations.  Neurological: Denies dizziness, difficulty with memory, difficulty with speech or problems with balance and coordination.  Psych: Denies anxiety, depression, SI/HI.  No other specific complaints in a complete review of systems (except as listed in HPI above).  Objective:  PE:   BP (!) 142/90   Pulse 79   Temp (!) 97.4 F (36.3 C) (Temporal)   Ht 5\' 7"  (1.702 m)   Wt (!) 305 lb (138.3 kg)   SpO2 98%   BMI 47.77 kg/m   Wt Readings from Last 3 Encounters:  11/04/18 (!) 305 lb (138.3 kg)  09/04/18 (!) 308 lb (139.7 kg)  07/23/18 296 lb 9.6 oz (134.5 kg)    General: Appears herstated age, obese, in NAD. Skin: Warm, dry and intact. No ulcerations noted. Vascular changes noted of BLE. HEENT: Head: normal shape and size; Eyes: sclera white, no icterus, conjunctiva pink, PERRLA and EOMs intact; Ears: Tm's gray and intact, normal light reflex;  Neck: Neck supple, trachea midline. No masses, lumps or thyromegaly present.  Cardiovascular: Normal rate and rhythm. S1,S2 noted.  No murmur, rubs or gallops noted. 1+ pitting BLE  edema. No carotid bruits noted. Pulmonary/Chest: Normal effort and positive vesicular breath sounds. No respiratory distress. No wheezes, rales or ronchi noted.  Abdomen: Soft and nontender. Normal bowel sounds. No distention or masses noted. Liver, spleen and kidneys non palpable. Musculoskeletal:  Strength 5/5 BUE/BLE. No signs of joint swelling.  Neurological: Alert and oriented. Cranial nerves II-XII grossly intact. Coordination normal.  Psychiatric: Mood and affect normal. Behavior is normal. Judgment and thought content normal.    BMET    Component Value Date/Time   NA 138 06/29/2018 1231   K 3.8 06/29/2018 1231   CL 101 06/29/2018 1231   CO2 30 06/29/2018 1231   GLUCOSE 296 (H) 06/29/2018 1231   BUN 18 06/29/2018 1231   CREATININE 1.05 06/29/2018 1231   CALCIUM 9.4 06/29/2018 1231   GFRNONAA 55 (L) 10/20/2014 1039   GFRAA >60 10/20/2014 1039    Lipid Panel     Component Value Date/Time   CHOL 128 06/29/2018 1231   TRIG 132.0 06/29/2018 1231   HDL 42.70 06/29/2018 1231   CHOLHDL 3 06/29/2018 1231   VLDL 26.4 06/29/2018 1231   LDLCALC 59 06/29/2018 1231    CBC    Component Value Date/Time   WBC 7.3 06/29/2018 1231   RBC 4.63 06/29/2018 1231   HGB 12.2 06/29/2018 1231   HCT 38.0 06/29/2018 1231   PLT 279.0 06/29/2018 1231   MCV 82.0 06/29/2018 1231   MCH 25.6 (L) 10/20/2014 1039   MCHC 32.1 06/29/2018 1231   RDW 14.9 06/29/2018 1231   LYMPHSABS 2.2 09/06/2014 1048   MONOABS 0.5 09/06/2014 1048   EOSABS 0.3 09/06/2014 1048   BASOSABS 0.0 09/06/2014 1048    Hgb A1C Lab Results  Component Value Date   HGBA1C 7.8 (A) 11/04/2018      Assessment and Plan:   Medicare Annual Wellness Visit:  Diet: She does eat meat. She consumes fruits and veggies daily. She tries to avoid fried foods. She drinks mostly coffee, water, tea  and juice. Physical activity:  Sedentary. Depression/mood screen: PHQ 9 score of 2 Hearing: Intact to whispered voice Visual acuity:  Grossly normal, performs annual eye exam  ADLs: Capable with use of cane. Fall risk: None Home safety: Good Cognitive evaluation: Intact to orientation, naming, recall and repetition EOL planning: No adv directives, full code/ I agree  Preventative Medicine: Flu and prevnar UTD. She declines tetanus for financial reasons. She will get pneumovax in 1 year. She declines shingles vaccines. Mammogram and bone density ordered, she will call [place] to schedule. She no longer needs pap smears. Colon screening UTD Encouraged her to consume a balanced diet and exercise regime. Advised her to see an eye doctor and dentist annually. Will check CMET today. Other labs reviewed. Due dates for screening exam given to patient as part of her AVS.   Next appointment: 6 months, follow up chronic conditions.    Nicki Reaper, NP

## 2018-12-22 NOTE — Assessment & Plan Note (Addendum)
Compensated Encouraged low sodium diet, daily weights Continue Metoprolol, Amlodipine, Losartan and Lasix

## 2018-12-22 NOTE — Assessment & Plan Note (Signed)
Encouraged regular physical activity and stretching Continue Aleve prn

## 2018-12-22 NOTE — Assessment & Plan Note (Addendum)
Controlled on Metoprolol, Amlodipine, Losartan and Lasix Reinforced DASH diet and exercise for weight loss CMET today

## 2018-12-22 NOTE — Assessment & Plan Note (Signed)
Continue Aleve and Bengay prn Encouraged weight loss

## 2018-12-22 NOTE — Patient Instructions (Signed)

## 2018-12-23 LAB — COMPREHENSIVE METABOLIC PANEL
ALT: 10 U/L (ref 0–35)
AST: 18 U/L (ref 0–37)
Albumin: 4.3 g/dL (ref 3.5–5.2)
Alkaline Phosphatase: 146 U/L — ABNORMAL HIGH (ref 39–117)
BUN: 17 mg/dL (ref 6–23)
CO2: 30 mEq/L (ref 19–32)
Calcium: 9.9 mg/dL (ref 8.4–10.5)
Chloride: 99 mEq/L (ref 96–112)
Creatinine, Ser: 1.09 mg/dL (ref 0.40–1.20)
GFR: 59.08 mL/min — ABNORMAL LOW (ref >60.00)
Glucose, Bld: 194 mg/dL — ABNORMAL HIGH (ref 70–99)
Potassium: 4 mEq/L (ref 3.5–5.1)
Sodium: 139 mEq/L (ref 135–145)
Total Bilirubin: 0.4 mg/dL (ref 0.2–1.2)
Total Protein: 8.7 g/dL — ABNORMAL HIGH (ref 6.0–8.3)

## 2018-12-28 ENCOUNTER — Telehealth: Payer: Self-pay

## 2018-12-28 NOTE — Telephone Encounter (Signed)
error 

## 2019-01-19 DIAGNOSIS — M545 Low back pain: Secondary | ICD-10-CM | POA: Diagnosis not present

## 2019-01-19 DIAGNOSIS — M65332 Trigger finger, left middle finger: Secondary | ICD-10-CM | POA: Diagnosis not present

## 2019-01-19 DIAGNOSIS — G5623 Lesion of ulnar nerve, bilateral upper limbs: Secondary | ICD-10-CM | POA: Diagnosis not present

## 2019-01-20 ENCOUNTER — Other Ambulatory Visit: Payer: Self-pay | Admitting: Internal Medicine

## 2019-01-20 DIAGNOSIS — I10 Essential (primary) hypertension: Secondary | ICD-10-CM

## 2019-01-25 DIAGNOSIS — M545 Low back pain: Secondary | ICD-10-CM | POA: Diagnosis not present

## 2019-01-28 ENCOUNTER — Other Ambulatory Visit: Payer: Self-pay | Admitting: Internal Medicine

## 2019-01-28 DIAGNOSIS — K219 Gastro-esophageal reflux disease without esophagitis: Secondary | ICD-10-CM

## 2019-01-28 DIAGNOSIS — M545 Low back pain: Secondary | ICD-10-CM | POA: Diagnosis not present

## 2019-01-28 DIAGNOSIS — E7800 Pure hypercholesterolemia, unspecified: Secondary | ICD-10-CM

## 2019-02-01 DIAGNOSIS — M545 Low back pain: Secondary | ICD-10-CM | POA: Diagnosis not present

## 2019-02-02 ENCOUNTER — Other Ambulatory Visit: Payer: Self-pay

## 2019-02-04 ENCOUNTER — Ambulatory Visit: Payer: Medicare HMO | Admitting: Endocrinology

## 2019-02-04 ENCOUNTER — Encounter: Payer: Self-pay | Admitting: Endocrinology

## 2019-02-04 ENCOUNTER — Other Ambulatory Visit: Payer: Self-pay

## 2019-02-04 VITALS — BP 124/78 | HR 97 | Ht 67.0 in | Wt 305.4 lb

## 2019-02-04 DIAGNOSIS — E1165 Type 2 diabetes mellitus with hyperglycemia: Secondary | ICD-10-CM | POA: Diagnosis not present

## 2019-02-04 DIAGNOSIS — IMO0002 Reserved for concepts with insufficient information to code with codable children: Secondary | ICD-10-CM

## 2019-02-04 DIAGNOSIS — E118 Type 2 diabetes mellitus with unspecified complications: Secondary | ICD-10-CM

## 2019-02-04 LAB — POCT GLYCOSYLATED HEMOGLOBIN (HGB A1C): Hemoglobin A1C: 9.3 % — AB (ref 4.0–5.6)

## 2019-02-04 MED ORDER — NOVOLIN 70/30 (70-30) 100 UNIT/ML ~~LOC~~ SUSP: 60.0000 [IU] | Freq: Every day | SUBCUTANEOUS | 11 refills | Status: DC

## 2019-02-04 NOTE — Progress Notes (Signed)
Subjective:    Patient ID: Bethany [name], female    DOB: 06-02-1945, 74 y.o.   MRN: [id number]  HPI Pt returns for f/u of diabetes mellitus: DM type: Insulin-requiring type 2 Dx'ed: 1998.  Complications: PN and renal insuff Therapy: insulin since 2009.  GDM: never DKA: never Severe hypoglycemia: once (2020). Pancreatitis: never. Other: she takes QD insulin, after poor results with multiple daily injections, but she often eats just 2 meals per day;  SDOH: she takes human insulin, due to cost.   Interval history: Pt still has mild hypoglycemia approx once per week.  This happens in the middle of the night.  Pt says she sometimes misses the insulin.  no cbg record, but states cbg varies from 72-290.  It is in general higher as the day goes on.  She recently had a steroid injection into the left hand.  Prior to that Past Medical History:  Diagnosis Date  . Arthritis   . Diabetes mellitus without complication ([place])    Type II  . Fibromyalgia   . GERD (gastroesophageal reflux disease)    better after gall bladder surgery-   . Hyperlipidemia   . Hypertension   . Shortness of breath dyspnea    with exertion    Past Surgical History:  Procedure Laterality Date  . ABDOMINAL HYSTERECTOMY  1997  . CATARACT EXTRACTION Bilateral 2014  . CHOLECYSTECTOMY  2010  . COLONOSCOPY    . COLONOSCOPY WITH PROPOFOL N/A 04/21/2015   Procedure: COLONOSCOPY WITH PROPOFOL;  Surgeon: Hulen Luster, MD;  Location: [facility] ENDOSCOPY;  Service: Gastroenterology;  Laterality: N/A;  . EYE SURGERY Bilateral    Cataract  . LUMBAR WOUND DEBRIDEMENT N/A 10/20/2014   Procedure: LUMBAR WOUND DEBRIDEMENT;  Surgeon: Eustace Moore, MD;  Location: [place] NEURO ORS;  Service: Neurosurgery;  Laterality: N/A;  . MAXIMUM ACCESS (MAS)POSTERIOR LUMBAR INTERBODY FUSION (PLIF) 1 LEVEL N/A 09/16/2014   Procedure: L/4-5 FOR MAXIMUM ACCESS (MAS) POSTERIOR LUMBAR INTERBODY FUSION (PLIF) 1 LEVEL;  Surgeon: Eustace Moore, MD;  Location: [place]  NEURO ORS;  Service: Neurosurgery;  Laterality: N/A;  L/4-5 FOR MAXIMUM ACCESS (MAS) POSTERIOR LUMBAR INTERBODY FUSION (PLIF) 1 LEVEL    Social History   Socioeconomic History  . Marital status: Married    Spouse name: Not on file  . Number of children: Not on file  . Years of education: Not on file  . Highest education level: Not on file  Occupational History  . Not on file  Tobacco Use  . Smoking status: Never Smoker  . Smokeless tobacco: Never Used  Substance and Sexual Activity  . Alcohol use: No  . Drug use: No  . Sexual activity: Not Currently  Other Topics Concern  . Not on file  Social History Narrative  . Not on file   Social Determinants of Health   Financial Resource Strain:   . Difficulty of Paying Living Expenses: Not on file  Food Insecurity:   . Worried About Charity fundraiser in the Last Year: Not on file  . Ran Out of Food in the Last Year: Not on file  Transportation Needs:   . Lack of Transportation (Medical): Not on file  . Lack of Transportation (Non-Medical): Not on file  Physical Activity:   . Days of Exercise per Week: Not on file  . Minutes of Exercise per Session: Not on file  Stress:   . Feeling of Stress : Not on file  Social Connections:   .  Frequency of Communication with Friends and Family: Not on file  . Frequency of Social Gatherings with Friends and Family: Not on file  . Attends Religious Services: Not on file  . Active Member of Clubs or Organizations: Not on file  . Attends Banker Meetings: Not on file  . Marital Status: Not on file  Intimate Partner Violence:   . Fear of Current or Ex-Partner: Not on file  . Emotionally Abused: Not on file  . Physically Abused: Not on file  . Sexually Abused: Not on file    Current Outpatient Medications on File Prior to Visit  Medication Sig Dispense Refill  . Alcohol Swabs (B-D SINGLE USE SWABS REGULAR) PADS USE TWICE DAILY 200 each 2  . amLODipine (NORVASC) 10 MG tablet  TAKE 1 TABLET EVERY DAY 90 tablet 0  . aspirin 81 MG tablet Take 81 mg by mouth daily. Reported on 03/29/2015    . atorvastatin (LIPITOR) 20 MG tablet TAKE 1 TABLET EVERY DAY 90 tablet 0  . Blood Glucose Monitoring Suppl (TRUE METRIX METER) DEVI by Does not apply route.    . diclofenac Sodium (VOLTAREN) 1 % GEL Voltaren 1 % topical gel  APPLY 2 GRAMS TO THE AFFECTED AREA(S) BY TOPICAL ROUTE 4 TIMES PER DAY    . docusate sodium (COLACE) 100 MG capsule Take 100 mg by mouth daily as needed for mild constipation. Reported on 03/29/2015    . fluticasone (FLONASE) 50 MCG/ACT nasal spray Place 2 sprays into both nostrils daily. 16 g 6  . furosemide (LASIX) 20 MG tablet TAKE 1 TABLET EVERY DAY AS NEEDED 90 tablet 0  . glucose blood (TRUE METRIX BLOOD GLUCOSE TEST) test strip 1 each by Other route 4 (four) times daily. 400 each 1  . losartan (COZAAR) 50 MG tablet TAKE 1 TABLET EVERY DAY 90 tablet 0  . meloxicam (MOBIC) 15 MG tablet Take 1 tablet (15 mg total) by mouth daily. 90 tablet 0  . metoprolol succinate (TOPROL-XL) 25 MG 24 hr tablet TAKE 1 TABLET EVERY DAY 90 tablet 0  . naproxen sodium (ANAPROX) 220 MG tablet Take 220 mg by mouth as needed (for pain).     . Omega-3 Fatty Acids (FISH OIL PO) Take 2,400 mg by mouth daily.     Marland Kitchen omeprazole (PRILOSEC) 20 MG capsule TAKE 1 CAPSULE EVERY DAY 90 capsule 0  . triamcinolone cream (KENALOG) 0.1 % Apply 1 application topically 4 (four) times daily. As needed for itching 80 g 2  . TRUEPLUS LANCETS 33G MISC TEST BLOOD SUGAR TWICE DAILY 200 each 2   No current facility-administered medications on file prior to visit.    No Known Allergies  Family History  Problem Relation Age of Onset  . Arthritis Mother   . Cancer Mother        Ovarian  . Diabetes Mother   . Heart disease Father   . Hypertension Father     BP 124/78 (BP Location: Right Wrist, Patient Position: Sitting, Cuff Size: Large)   Pulse 97   Ht 5\' 7"  (1.702 m)   Wt (!) 305 lb 6.4 oz  (138.5 kg)   SpO2 92%   BMI 47.83 kg/m    Review of Systems Denies LOC    Objective:   Physical Exam VITAL SIGNS:  See vs page GENERAL: no distress Pulses: dorsalis pedis intact bilat.   MSK: no deformity of the feet CV: no leg edema Skin:  no ulcer on the feet.  normal  color and temp on the feet. Neuro: sensation is intact to touch on the feet  A1c=9.3%  Lab Results  Component Value Date   CREATININE 1.09 12/22/2018   BUN 17 12/22/2018   NA 139 12/22/2018   K 4.0 12/22/2018   CL 99 12/22/2018   CO2 30 12/22/2018      Assessment & Plan:  Insulin-requiring type 2 DM, with PN: worse Hypoglycemia: this limits aggressiveness of glycemic control Renal insuff: she needs a faster-acting QAM insulin.  Noncompliance with insulin.  We discussed.  Pt says she'll takes qd as rx'ed.  Hand pain: steroid rx is affecting A1c  Patient Instructions  Please change the NPH insulin to "70/30," 60 units each morning.   On this type of insulin schedule, you should eat meals on a regular schedule.  If a meal is missed or significantly delayed, your blood sugar could go low.   check your blood sugar twice a day.  vary the time of day when you check, between before the 3 meals, and at bedtime.  also check if you have symptoms of your blood sugar being too high or too low.  please keep a record of the readings and bring it to your next appointment here (or you can bring the meter itself).  You can write it on any piece of paper.  please call us sooner if your blood sugar goes below 70, or if you have a lot of readings over 200.   Please come back for a follow-up appointment in 2 months.

## 2019-02-04 NOTE — Patient Instructions (Addendum)
Please change the NPH insulin to "70/30," 60 units each morning.   On this type of insulin schedule, you should eat meals on a regular schedule.  If a meal is missed or significantly delayed, your blood sugar could go low.   check your blood sugar twice a day.  vary the time of day when you check, between before the 3 meals, and at bedtime.  also check if you have symptoms of your blood sugar being too high or too low.  please keep a record of the readings and bring it to your next appointment here (or you can bring the meter itself).  You can write it on any piece of paper.  please call us sooner if your blood sugar goes below 70, or if you have a lot of readings over 200.   Please come back for a follow-up appointment in 2 months.

## 2019-02-08 DIAGNOSIS — M545 Low back pain: Secondary | ICD-10-CM | POA: Diagnosis not present

## 2019-02-11 DIAGNOSIS — M545 Low back pain: Secondary | ICD-10-CM | POA: Diagnosis not present

## 2019-02-11 DIAGNOSIS — G5603 Carpal tunnel syndrome, bilateral upper limbs: Secondary | ICD-10-CM | POA: Diagnosis not present

## 2019-02-15 DIAGNOSIS — M545 Low back pain: Secondary | ICD-10-CM | POA: Diagnosis not present

## 2019-02-15 DIAGNOSIS — G5603 Carpal tunnel syndrome, bilateral upper limbs: Secondary | ICD-10-CM | POA: Diagnosis not present

## 2019-02-17 DIAGNOSIS — M545 Low back pain: Secondary | ICD-10-CM | POA: Diagnosis not present

## 2019-03-11 DIAGNOSIS — M5136 Other intervertebral disc degeneration, lumbar region: Secondary | ICD-10-CM | POA: Diagnosis not present

## 2019-03-11 DIAGNOSIS — M48061 Spinal stenosis, lumbar region without neurogenic claudication: Secondary | ICD-10-CM | POA: Diagnosis not present

## 2019-03-20 ENCOUNTER — Other Ambulatory Visit: Payer: Self-pay | Admitting: Internal Medicine

## 2019-03-20 DIAGNOSIS — I10 Essential (primary) hypertension: Secondary | ICD-10-CM

## 2019-03-29 DIAGNOSIS — M48061 Spinal stenosis, lumbar region without neurogenic claudication: Secondary | ICD-10-CM | POA: Diagnosis not present

## 2019-04-02 ENCOUNTER — Other Ambulatory Visit: Payer: Self-pay

## 2019-04-05 ENCOUNTER — Ambulatory Visit
Admission: RE | Admit: 2019-04-05 | Discharge: 2019-04-05 | Disposition: A | Discharge status: Home / Self Care | Payer: Medicare HMO | Source: Ambulatory Visit | Attending: Internal Medicine | Admitting: Internal Medicine

## 2019-04-05 DIAGNOSIS — Z1231 Encounter for screening mammogram for malignant neoplasm of breast: Secondary | ICD-10-CM

## 2019-04-05 DIAGNOSIS — M8589 Other specified disorders of bone density and structure, multiple sites: Secondary | ICD-10-CM | POA: Diagnosis not present

## 2019-04-05 DIAGNOSIS — Z780 Asymptomatic menopausal state: Secondary | ICD-10-CM | POA: Diagnosis not present

## 2019-04-06 ENCOUNTER — Ambulatory Visit: Payer: Medicare HMO | Admitting: Endocrinology

## 2019-04-06 ENCOUNTER — Encounter: Payer: Self-pay | Admitting: Endocrinology

## 2019-04-06 ENCOUNTER — Other Ambulatory Visit: Payer: Self-pay

## 2019-04-06 VITALS — BP 124/80 | HR 88 | Ht 67.0 in | Wt 306.0 lb

## 2019-04-06 DIAGNOSIS — E1165 Type 2 diabetes mellitus with hyperglycemia: Secondary | ICD-10-CM | POA: Diagnosis not present

## 2019-04-06 DIAGNOSIS — E118 Type 2 diabetes mellitus with unspecified complications: Secondary | ICD-10-CM

## 2019-04-06 DIAGNOSIS — IMO0002 Reserved for concepts with insufficient information to code with codable children: Secondary | ICD-10-CM

## 2019-04-06 LAB — POCT GLYCOSYLATED HEMOGLOBIN (HGB A1C): Hemoglobin A1C: 8.8 % — AB (ref 4.0–5.6)

## 2019-04-06 MED ORDER — NOVOLIN 70/30 (70-30) 100 UNIT/ML ~~LOC~~ SUSP: 55.0000 [IU] | Freq: Every day | SUBCUTANEOUS | 11 refills | Status: DC

## 2019-04-06 NOTE — Progress Notes (Signed)
Subjective:    Patient ID: Bethany [name], female    DOB: 1945/06/01, 74 y.o.   MRN: [id number]  HPI Pt returns for f/u of diabetes mellitus: DM type: Insulin-requiring type 2 Dx'ed: 1998.  Complications: PN and renal insuff Therapy: insulin since 2009.  GDM: never DKA: never Severe hypoglycemia: once (2020). Pancreatitis: never. Other: she takes QD insulin, after poor results with multiple daily injections, but she often eats just 2 meals per day; she changed to 70/30, due to pattern of cbg's.   SDOH: she takes human insulin, due to cost.   Interval history: Pt still has mild hypoglycemia approx once per week.  Pt says she sometimes misses the insulin approx once per week.  no cbg record, but states cbg varies from 67-290.  It is in general lowest in the afternoon, and highest at HS.  No recent steroids.   Past Medical History:  Diagnosis Date  . Arthritis   . Diabetes mellitus without complication ([place])    Type II  . Fibromyalgia   . GERD (gastroesophageal reflux disease)    better after gall bladder surgery-   . Hyperlipidemia   . Hypertension   . Shortness of breath dyspnea    with exertion    Past Surgical History:  Procedure Laterality Date  . ABDOMINAL HYSTERECTOMY  1997  . CATARACT EXTRACTION Bilateral 2014  . CHOLECYSTECTOMY  2010  . COLONOSCOPY    . COLONOSCOPY WITH PROPOFOL N/A 04/21/2015   Procedure: COLONOSCOPY WITH PROPOFOL;  Surgeon: Hulen Luster, MD;  Location: [facility] ENDOSCOPY;  Service: Gastroenterology;  Laterality: N/A;  . EYE SURGERY Bilateral    Cataract  . LUMBAR WOUND DEBRIDEMENT N/A 10/20/2014   Procedure: LUMBAR WOUND DEBRIDEMENT;  Surgeon: Eustace Moore, MD;  Location: [place] NEURO ORS;  Service: Neurosurgery;  Laterality: N/A;  . MAXIMUM ACCESS (MAS)POSTERIOR LUMBAR INTERBODY FUSION (PLIF) 1 LEVEL N/A 09/16/2014   Procedure: L/4-5 FOR MAXIMUM ACCESS (MAS) POSTERIOR LUMBAR INTERBODY FUSION (PLIF) 1 LEVEL;  Surgeon: Eustace Moore, MD;  Location: [place] NEURO ORS;   Service: Neurosurgery;  Laterality: N/A;  L/4-5 FOR MAXIMUM ACCESS (MAS) POSTERIOR LUMBAR INTERBODY FUSION (PLIF) 1 LEVEL    Social History   Socioeconomic History  . Marital status: Married    Spouse name: Not on file  . Number of children: Not on file  . Years of education: Not on file  . Highest education level: Not on file  Occupational History  . Not on file  Tobacco Use  . Smoking status: Never Smoker  . Smokeless tobacco: Never Used  Substance and Sexual Activity  . Alcohol use: No  . Drug use: No  . Sexual activity: Not Currently  Other Topics Concern  . Not on file  Social History Narrative  . Not on file   Social Determinants of Health   Financial Resource Strain:   . Difficulty of Paying Living Expenses:   Food Insecurity:   . Worried About Charity fundraiser in the Last Year:   . Arboriculturist in the Last Year:   Transportation Needs:   . Film/video editor (Medical):   Marland Kitchen Lack of Transportation (Non-Medical):   Physical Activity:   . Days of Exercise per Week:   . Minutes of Exercise per Session:   Stress:   . Feeling of Stress :   Social Connections:   . Frequency of Communication with Friends and Family:   . Frequency of Social Gatherings with Friends and Family:   .  Attends Religious Services:   . Active Member of Clubs or Organizations:   . Attends Banker Meetings:   Marland Kitchen Marital Status:   Intimate Partner Violence:   . Fear of Current or Ex-Partner:   . Emotionally Abused:   Marland Kitchen Physically Abused:   . Sexually Abused:     Current Outpatient Medications on File Prior to Visit  Medication Sig Dispense Refill  . Alcohol Swabs (B-D SINGLE USE SWABS REGULAR) PADS USE TWICE DAILY 200 each 2  . amLODipine (NORVASC) 10 MG tablet TAKE 1 TABLET EVERY DAY 90 tablet 0  . aspirin 81 MG tablet Take 81 mg by mouth daily. Reported on 03/29/2015    . atorvastatin (LIPITOR) 20 MG tablet TAKE 1 TABLET EVERY DAY 90 tablet 0  . Blood Glucose  Monitoring Suppl (TRUE METRIX METER) DEVI by Does not apply route.    . diclofenac Sodium (VOLTAREN) 1 % GEL Voltaren 1 % topical gel  APPLY 2 GRAMS TO THE AFFECTED AREA(S) BY TOPICAL ROUTE 4 TIMES PER DAY    . docusate sodium (COLACE) 100 MG capsule Take 100 mg by mouth daily as needed for mild constipation. Reported on 03/29/2015    . fluticasone (FLONASE) 50 MCG/ACT nasal spray Place 2 sprays into both nostrils daily. 16 g 6  . furosemide (LASIX) 20 MG tablet TAKE 1 TABLET EVERY DAY AS NEEDED 90 tablet 0  . glucose blood (TRUE METRIX BLOOD GLUCOSE TEST) test strip 1 each by Other route 4 (four) times daily. 400 each 1  . losartan (COZAAR) 50 MG tablet TAKE 1 TABLET EVERY DAY 90 tablet 0  . meloxicam (MOBIC) 15 MG tablet Take 1 tablet (15 mg total) by mouth daily. 90 tablet 0  . metoprolol succinate (TOPROL-XL) 25 MG 24 hr tablet TAKE 1 TABLET EVERY DAY 90 tablet 0  . naproxen sodium (ANAPROX) 220 MG tablet Take 220 mg by mouth as needed (for pain).     . Omega-3 Fatty Acids (FISH OIL PO) Take 2,400 mg by mouth daily.     Marland Kitchen omeprazole (PRILOSEC) 20 MG capsule TAKE 1 CAPSULE EVERY DAY 90 capsule 0  . triamcinolone cream (KENALOG) 0.1 % Apply 1 application topically 4 (four) times daily. As needed for itching 80 g 2  . TRUEPLUS LANCETS 33G MISC TEST BLOOD SUGAR TWICE DAILY 200 each 2   No current facility-administered medications on file prior to visit.    No Known Allergies  Family History  Problem Relation Age of Onset  . Arthritis Mother   . Cancer Mother        Ovarian  . Diabetes Mother   . Heart disease Father   . Hypertension Father     BP 124/80   Pulse 88   Ht 5\' 7"  (1.702 m)   Wt (!) 306 lb (138.8 kg)   SpO2 98%   BMI 47.93 kg/m    Review of Systems Denies LOC.  No weight change.      Objective:   Physical Exam VITAL SIGNS:  See vs page GENERAL: no distress Pulses: dorsalis pedis intact bilat.   MSK: no deformity of the feet.   CV: trace bilat leg edema.    Skin:  no ulcer on the feet.  normal temp on the feet.  There is hyperpigmentation of the ankles.   Neuro: sensation is intact to touch on the feet, but decreased from normal  Lab Results  Component Value Date   HGBA1C 8.8 (A) 04/06/2019  Lab Results  Component Value Date   CREATININE 1.09 12/22/2018   BUN 17 12/22/2018   NA 139 12/22/2018   K 4.0 12/22/2018   CL 99 12/22/2018   CO2 30 12/22/2018        Assessment & Plan:  Insulin-requiring type 2 DM.  Hypoglycemia: this limits aggressiveness of glycemic control.  Noncompliance with insulin.  We discussed the need to take every day.   Patient Instructions  Please reduce the insulin to 55 units with breakfast.   On this type of insulin schedule, you should eat meals on a regular schedule.  If a meal is missed or significantly delayed, your blood sugar could go low.   check your blood sugar twice a day.  vary the time of day when you check, between before the 3 meals, and at bedtime.  also check if you have symptoms of your blood sugar being too high or too low.  please keep a record of the readings and bring it to your next appointment here (or you can bring the meter itself).  You can write it on any piece of paper.  please call us sooner if your blood sugar goes below 70, or if you have a lot of readings over 200.   If the blood sugar goes over 250 after a steroid injection, take 10 units that day.   Please come back for a follow-up appointment in 2 months.

## 2019-04-06 NOTE — Patient Instructions (Addendum)
Please reduce the insulin to 55 units with breakfast.   On this type of insulin schedule, you should eat meals on a regular schedule.  If a meal is missed or significantly delayed, your blood sugar could go low.   check your blood sugar twice a day.  vary the time of day when you check, between before the 3 meals, and at bedtime.  also check if you have symptoms of your blood sugar being too high or too low.  please keep a record of the readings and bring it to your next appointment here (or you can bring the meter itself).  You can write it on any piece of paper.  please call us sooner if your blood sugar goes below 70, or if you have a lot of readings over 200.   If the blood sugar goes over 250 after a steroid injection, take 10 units that day.   Please come back for a follow-up appointment in 2 months.

## 2019-04-08 DIAGNOSIS — M5136 Other intervertebral disc degeneration, lumbar region: Secondary | ICD-10-CM | POA: Diagnosis not present

## 2019-04-08 DIAGNOSIS — M48061 Spinal stenosis, lumbar region without neurogenic claudication: Secondary | ICD-10-CM | POA: Diagnosis not present

## 2019-04-14 DIAGNOSIS — M5416 Radiculopathy, lumbar region: Secondary | ICD-10-CM | POA: Diagnosis not present

## 2019-04-22 DIAGNOSIS — M5416 Radiculopathy, lumbar region: Secondary | ICD-10-CM | POA: Diagnosis not present

## 2019-05-26 ENCOUNTER — Other Ambulatory Visit: Payer: Self-pay | Admitting: Internal Medicine

## 2019-05-26 DIAGNOSIS — I10 Essential (primary) hypertension: Secondary | ICD-10-CM

## 2019-05-27 NOTE — Telephone Encounter (Signed)
Last appointment was in December 2020. AVS states for follow up in 6 months. No appt scheduled.   Pt had a 90 day supply sent in on March.

## 2019-06-04 ENCOUNTER — Other Ambulatory Visit: Payer: Self-pay

## 2019-06-08 ENCOUNTER — Encounter: Payer: Self-pay | Admitting: Endocrinology

## 2019-06-08 ENCOUNTER — Ambulatory Visit (INDEPENDENT_AMBULATORY_CARE_PROVIDER_SITE_OTHER): Payer: Medicare HMO | Admitting: Endocrinology

## 2019-06-08 ENCOUNTER — Other Ambulatory Visit: Payer: Self-pay

## 2019-06-08 VITALS — BP 130/78 | HR 95 | Ht 67.0 in | Wt 306.0 lb

## 2019-06-08 DIAGNOSIS — E118 Type 2 diabetes mellitus with unspecified complications: Secondary | ICD-10-CM | POA: Diagnosis not present

## 2019-06-08 DIAGNOSIS — Z794 Long term (current) use of insulin: Secondary | ICD-10-CM | POA: Diagnosis not present

## 2019-06-08 DIAGNOSIS — E1165 Type 2 diabetes mellitus with hyperglycemia: Secondary | ICD-10-CM | POA: Diagnosis not present

## 2019-06-08 DIAGNOSIS — IMO0002 Reserved for concepts with insufficient information to code with codable children: Secondary | ICD-10-CM

## 2019-06-08 DIAGNOSIS — E1122 Type 2 diabetes mellitus with diabetic chronic kidney disease: Secondary | ICD-10-CM | POA: Diagnosis not present

## 2019-06-08 DIAGNOSIS — Z6841 Body Mass Index (BMI) 40.0 and over, adult: Secondary | ICD-10-CM | POA: Diagnosis not present

## 2019-06-08 LAB — POCT GLYCOSYLATED HEMOGLOBIN (HGB A1C): Hemoglobin A1C: 8.5 % — AB (ref 4.0–5.6)

## 2019-06-08 MED ORDER — NOVOLIN 70/30 (70-30) 100 UNIT/ML ~~LOC~~ SUSP
55.0000 [IU] | Freq: Every day | SUBCUTANEOUS | 11 refills | Status: DC
Start: 2019-06-08 — End: 2020-01-26

## 2019-06-08 NOTE — Patient Instructions (Signed)
Please increase the insulin to 60 units with breakfast.   On this type of insulin schedule, you should eat meals on a regular schedule (especially lunch).  If a meal is missed or significantly delayed, your blood sugar could go low.   check your blood sugar twice a day.  vary the time of day when you check, between before the 3 meals, and at bedtime.  also check if you have symptoms of your blood sugar being too high or too low.  please keep a record of the readings and bring it to your next appointment here (or you can bring the meter itself).  You can write it on any piece of paper.  please call us sooner if your blood sugar goes below 70, or if you have a lot of readings over 200.   Please come back for a follow-up appointment in 2 months.

## 2019-06-08 NOTE — Progress Notes (Signed)
Subjective:    Patient ID: Bethany [name], female    DOB: 1945-09-12, 74 y.o.   MRN: [id number]  HPI Pt returns for f/u of diabetes mellitus: DM type: Insulin-requiring type 2 Dx'ed: 1998.  Complications: PN and renal insuff Therapy: insulin since 2009.  GDM: never DKA: never Severe hypoglycemia: once (2020). Pancreatitis: never. Other: she takes QD insulin, after poor results with multiple daily injections, but she often eats just 2 meals per day; she changed to 70/30, due to pattern of cbg's.   SDOH: she takes human insulin, due to cost.   Interval history:  Pt still has mild hypoglycemia approx once per week.  Pt says she sometimes misses the insulin approx twice per month.  no cbg record, but states cbg varies from 109-220.  It is in general lowest when lunch is delayed.  She had a steroid injection into the L-spine approx 1 month ago.  This increased cbg to 290 x 1 day.  It then returned to the low-100's Past Medical History:  Diagnosis Date  . Arthritis   . Diabetes mellitus without complication ([place])    Type II  . Fibromyalgia   . GERD (gastroesophageal reflux disease)    better after gall bladder surgery-   . Hyperlipidemia   . Hypertension   . Shortness of breath dyspnea    with exertion    Past Surgical History:  Procedure Laterality Date  . ABDOMINAL HYSTERECTOMY  1997  . CATARACT EXTRACTION Bilateral 2014  . CHOLECYSTECTOMY  2010  . COLONOSCOPY    . COLONOSCOPY WITH PROPOFOL N/A 04/21/2015   Procedure: COLONOSCOPY WITH PROPOFOL;  Surgeon: Hulen Luster, MD;  Location: [facility] ENDOSCOPY;  Service: Gastroenterology;  Laterality: N/A;  . EYE SURGERY Bilateral    Cataract  . LUMBAR WOUND DEBRIDEMENT N/A 10/20/2014   Procedure: LUMBAR WOUND DEBRIDEMENT;  Surgeon: Eustace Moore, MD;  Location: [place] NEURO ORS;  Service: Neurosurgery;  Laterality: N/A;  . MAXIMUM ACCESS (MAS)POSTERIOR LUMBAR INTERBODY FUSION (PLIF) 1 LEVEL N/A 09/16/2014   Procedure: L/4-5 FOR MAXIMUM ACCESS (MAS)  POSTERIOR LUMBAR INTERBODY FUSION (PLIF) 1 LEVEL;  Surgeon: Eustace Moore, MD;  Location: [place] NEURO ORS;  Service: Neurosurgery;  Laterality: N/A;  L/4-5 FOR MAXIMUM ACCESS (MAS) POSTERIOR LUMBAR INTERBODY FUSION (PLIF) 1 LEVEL    Social History   Socioeconomic History  . Marital status: Married    Spouse name: Not on file  . Number of children: Not on file  . Years of education: Not on file  . Highest education level: Not on file  Occupational History  . Not on file  Tobacco Use  . Smoking status: Never Smoker  . Smokeless tobacco: Never Used  Substance and Sexual Activity  . Alcohol use: No  . Drug use: No  . Sexual activity: Not Currently  Other Topics Concern  . Not on file  Social History Narrative  . Not on file   Social Determinants of Health   Financial Resource Strain:   . Difficulty of Paying Living Expenses:   Food Insecurity:   . Worried About Charity fundraiser in the Last Year:   . Arboriculturist in the Last Year:   Transportation Needs:   . Film/video editor (Medical):   Marland Kitchen Lack of Transportation (Non-Medical):   Physical Activity:   . Days of Exercise per Week:   . Minutes of Exercise per Session:   Stress:   . Feeling of Stress :   Social Connections:   .  Frequency of Communication with Friends and Family:   . Frequency of Social Gatherings with Friends and Family:   . Attends Religious Services:   . Active Member of Clubs or Organizations:   . Attends Banker Meetings:   Marland Kitchen Marital Status:   Intimate Partner Violence:   . Fear of Current or Ex-Partner:   . Emotionally Abused:   Marland Kitchen Physically Abused:   . Sexually Abused:     Current Outpatient Medications on File Prior to Visit  Medication Sig Dispense Refill  . Alcohol Swabs (B-D SINGLE USE SWABS REGULAR) PADS USE TWICE DAILY 200 each 2  . amLODipine (NORVASC) 10 MG tablet TAKE 1 TABLET EVERY DAY 90 tablet 0  . aspirin 81 MG tablet Take 81 mg by mouth daily. Reported on  03/29/2015    . atorvastatin (LIPITOR) 20 MG tablet TAKE 1 TABLET EVERY DAY 90 tablet 0  . Blood Glucose Monitoring Suppl (TRUE METRIX METER) DEVI by Does not apply route.    . diclofenac Sodium (VOLTAREN) 1 % GEL Voltaren 1 % topical gel  APPLY 2 GRAMS TO THE AFFECTED AREA(S) BY TOPICAL ROUTE 4 TIMES PER DAY    . docusate sodium (COLACE) 100 MG capsule Take 100 mg by mouth daily as needed for mild constipation. Reported on 03/29/2015    . fluticasone (FLONASE) 50 MCG/ACT nasal spray Place 2 sprays into both nostrils daily. 16 g 6  . furosemide (LASIX) 20 MG tablet TAKE 1 TABLET EVERY DAY AS NEEDED 90 tablet 0  . glucose blood (TRUE METRIX BLOOD GLUCOSE TEST) test strip 1 each by Other route 4 (four) times daily. 400 each 1  . losartan (COZAAR) 50 MG tablet TAKE 1 TABLET EVERY DAY 90 tablet 0  . meloxicam (MOBIC) 15 MG tablet Take 1 tablet (15 mg total) by mouth daily. 90 tablet 0  . metoprolol succinate (TOPROL-XL) 25 MG 24 hr tablet TAKE 1 TABLET EVERY DAY 90 tablet 0  . naproxen sodium (ANAPROX) 220 MG tablet Take 220 mg by mouth as needed (for pain).     . Omega-3 Fatty Acids (FISH OIL PO) Take 2,400 mg by mouth daily.     Marland Kitchen omeprazole (PRILOSEC) 20 MG capsule TAKE 1 CAPSULE EVERY DAY 90 capsule 0  . triamcinolone cream (KENALOG) 0.1 % Apply 1 application topically 4 (four) times daily. As needed for itching 80 g 2  . TRUEPLUS LANCETS 33G MISC TEST BLOOD SUGAR TWICE DAILY 200 each 2   No current facility-administered medications on file prior to visit.    No Known Allergies  Family History  Problem Relation Age of Onset  . Arthritis Mother   . Cancer Mother        Ovarian  . Diabetes Mother   . Heart disease Father   . Hypertension Father     BP 130/78   Pulse 95   Ht 5\' 7"  (1.702 m)   Wt (!) 306 lb (138.8 kg)   SpO2 93%   BMI 47.93 kg/m    Review of Systems She denies LOC    Objective:   Physical Exam VITAL SIGNS:  See vs page GENERAL: no distress Pulses: dorsalis  pedis intact bilat.   MSK: no deformity of the feet CV: 1+ bilat leg edema Skin:  no ulcer on the feet.  normal color and temp on the feet.  Neuro: sensation is intact to touch on the feet, but decreased from normal.    Lab Results  Component Value Date  HGBA1C 8.5 (A) 06/08/2019        Assessment & Plan:  Insulin-requiring type 2 DM, with CRI: she needs increased rx Back pain: increased cbg is a side effect of the steroid injection.  Therefore, we'll increase the insulin just slightly  Patient Instructions  Please increase the insulin to 60 units with breakfast.   On this type of insulin schedule, you should eat meals on a regular schedule (especially lunch).  If a meal is missed or significantly delayed, your blood sugar could go low.   check your blood sugar twice a day.  vary the time of day when you check, between before the 3 meals, and at bedtime.  also check if you have symptoms of your blood sugar being too high or too low.  please keep a record of the readings and bring it to your next appointment here (or you can bring the meter itself).  You can write it on any piece of paper.  please call us sooner if your blood sugar goes below 70, or if you have a lot of readings over 200.   Please come back for a follow-up appointment in 2 months.

## 2019-08-09 ENCOUNTER — Other Ambulatory Visit: Payer: Self-pay

## 2019-08-09 ENCOUNTER — Ambulatory Visit: Payer: Medicare HMO | Admitting: Endocrinology

## 2019-08-09 ENCOUNTER — Encounter: Payer: Self-pay | Admitting: Endocrinology

## 2019-08-09 VITALS — BP 144/70 | HR 75 | Ht 67.0 in | Wt 294.0 lb

## 2019-08-09 DIAGNOSIS — IMO0002 Reserved for concepts with insufficient information to code with codable children: Secondary | ICD-10-CM

## 2019-08-09 DIAGNOSIS — E1165 Type 2 diabetes mellitus with hyperglycemia: Secondary | ICD-10-CM

## 2019-08-09 DIAGNOSIS — E118 Type 2 diabetes mellitus with unspecified complications: Secondary | ICD-10-CM

## 2019-08-09 LAB — POCT GLYCOSYLATED HEMOGLOBIN (HGB A1C): Hemoglobin A1C: 10.3 % — AB (ref 4.0–5.6)

## 2019-08-09 LAB — BASIC METABOLIC PANEL
BUN: 21 mg/dL (ref 6–23)
CO2: 28 mEq/L (ref 19–32)
Calcium: 9.5 mg/dL (ref 8.4–10.5)
Chloride: 104 mEq/L (ref 96–112)
Creatinine, Ser: 1.22 mg/dL — ABNORMAL HIGH (ref 0.40–1.20)
GFR: 51.79 mL/min — ABNORMAL LOW (ref >60.00)
Glucose, Bld: 244 mg/dL — ABNORMAL HIGH (ref 70–99)
Potassium: 5 mEq/L (ref 3.5–5.1)
Sodium: 139 mEq/L (ref 135–145)

## 2019-08-09 LAB — TSH: TSH: 4.07 u[IU]/mL (ref 0.35–4.50)

## 2019-08-09 LAB — VITAMIN D 25 HYDROXY (VIT D DEFICIENCY, FRACTURES): VITD: 18.4 ng/mL — ABNORMAL LOW (ref 30.00–100.00)

## 2019-08-09 NOTE — Patient Instructions (Addendum)
Please continue the same insulin.   Blood tests are requested for you today.  We'll let you know about the results.   On this type of insulin schedule, you should eat meals on a regular schedule (especially lunch).  If a meal is missed or significantly delayed, your blood sugar could go low.   check your blood sugar twice a day.  vary the time of day when you check, between before the 3 meals, and at bedtime.  also check if you have symptoms of your blood sugar being too high or too low.  please keep a record of the readings and bring it to your next appointment here (or you can bring the meter itself).  You can write it on any piece of paper.  please call us sooner if your blood sugar goes below 70, or if you have a lot of readings over 200.   Please come back for a follow-up appointment in 2 months.

## 2019-08-09 NOTE — Progress Notes (Signed)
Subjective:    Patient ID: Bethany [name], female    DOB: September 25, 1945, 74 y.o.   MRN: [id number]  HPI Pt returns for f/u of diabetes mellitus: DM type: Insulin-requiring type 2 Dx'ed: 1998.  Complications: PN and renal insuff Therapy: insulin since 2009.  GDM: never DKA: never Severe hypoglycemia: once (2020). Pancreatitis: never. Other: she takes QD insulin, after poor results with multiple daily injections, but she often eats just 2 meals per day; she changed to 70/30, due to pattern of cbg's.   SDOH: she takes human insulin, due to cost.   Interval history:  Pt still has mild hypoglycemia approx twice per month.  This happens when a meal is delayed.  Pt says she often misses the insulin--mostly when she was out of town, and forgot her insulin.  no cbg record, but states cbg varies from 121-370.  It is in general lowest when lunch is delayed.  No recent steroids.  Main symptom is muscle cramps Past Medical History:  Diagnosis Date  . Arthritis   . Diabetes mellitus without complication (HCC)    Type II  . Fibromyalgia   . GERD (gastroesophageal reflux disease)    better after gall bladder surgery-   . Hyperlipidemia   . Hypertension   . Shortness of breath dyspnea    with exertion    Past Surgical History:  Procedure Laterality Date  . ABDOMINAL HYSTERECTOMY  1997  . CATARACT EXTRACTION Bilateral 2014  . CHOLECYSTECTOMY  2010  . COLONOSCOPY    . COLONOSCOPY WITH PROPOFOL N/A 04/21/2015   Procedure: COLONOSCOPY WITH PROPOFOL;  Surgeon: Wallace Cullens, MD;  Location: [facility] ENDOSCOPY;  Service: Gastroenterology;  Laterality: N/A;  . EYE SURGERY Bilateral    Cataract  . LUMBAR WOUND DEBRIDEMENT N/A 10/20/2014   Procedure: LUMBAR WOUND DEBRIDEMENT;  Surgeon: Tia Alert, MD;  Location: [facility] NEURO ORS;  Service: Neurosurgery;  Laterality: N/A;  . MAXIMUM ACCESS (MAS)POSTERIOR LUMBAR INTERBODY FUSION (PLIF) 1 LEVEL N/A 09/16/2014   Procedure: L/4-5 FOR MAXIMUM ACCESS (MAS) POSTERIOR  LUMBAR INTERBODY FUSION (PLIF) 1 LEVEL;  Surgeon: Tia Alert, MD;  Location: [facility]UR[facility];  Service: Neurosurgery;  Laterality: N/A;  L/4-5 FOR MAXIMUM ACCESS (MAS) POSTERIOR LUMBAR INTERBODY FUSION (PLIF) 1 LEVEL    Social History   Socioeconomic History  . Marital status: Married    Spouse name: Not on file  . Number of children: Not on file  . Years of education: Not on file  . Highest education level: Not on file  Occupational History  . Not on file  Tobacco Use  . Smoking status: Never Smoker  . Smokeless tobacco: Never Used  Substance and Sexual Activity  . Alcohol use: No  . Drug use: No  . Sexual activity: Not Currently  Other Topics Concern  . Not on file  Social History Narrative  . Not on file   Social Determinants of Health   Financial Resource Strain:   . Difficulty of Paying Living Expenses:   Food Insecurity:   . Worried About Programme researcher, broadcasting/film/video in the Last Year:   . Barista in the Last Year:   Transportation Needs:   . Freight forwarder (Medical):   Marland Kitchen Lack of Transportation (Non-Medical):   Physical Activity:   . Days of Exercise per Week:   . Minutes of Exercise per Session:   Stress:   . Feeling of Stress :   Social Connections:   . Frequency of  Communication with Friends and Family:   . Frequency of Social Gatherings with Friends and Family:   . Attends Religious Services:   . Active Member of Clubs or Organizations:   . Attends Banker Meetings:   Marland Kitchen Marital Status:   Intimate Partner Violence:   . Fear of Current or Ex-Partner:   . Emotionally Abused:   Marland Kitchen Physically Abused:   . Sexually Abused:     Current Outpatient Medications on File Prior to Visit  Medication Sig Dispense Refill  . Alcohol Swabs (B-D SINGLE USE SWABS REGULAR) PADS USE TWICE DAILY 200 each 2  . amLODipine (NORVASC) 10 MG tablet TAKE 1 TABLET EVERY DAY 90 tablet 0  . aspirin 81 MG tablet Take 81 mg by mouth daily. Reported on 03/29/2015    .  atorvastatin (LIPITOR) 20 MG tablet TAKE 1 TABLET EVERY DAY 90 tablet 0  . Blood Glucose Monitoring Suppl (TRUE METRIX METER) DEVI by Does not apply route.    . diclofenac Sodium (VOLTAREN) 1 % GEL Voltaren 1 % topical gel  APPLY 2 GRAMS TO THE AFFECTED AREA(S) BY TOPICAL ROUTE 4 TIMES PER DAY    . docusate sodium (COLACE) 100 MG capsule Take 100 mg by mouth daily as needed for mild constipation. Reported on 03/29/2015    . fluticasone (FLONASE) 50 MCG/ACT nasal spray Place 2 sprays into both nostrils daily. 16 g 6  . furosemide (LASIX) 20 MG tablet TAKE 1 TABLET EVERY DAY AS NEEDED 90 tablet 0  . glucose blood (TRUE METRIX BLOOD GLUCOSE TEST) test strip 1 each by Other route 4 (four) times daily. 400 each 1  . insulin NPH-regular Human (NOVOLIN 70/30) (70-30) 100 UNIT/ML injection Inject 55 Units into the skin daily with breakfast. And syringes 1/day. (Patient taking differently: Inject 60 Units into the skin daily with breakfast. And syringes 1/day.) 60 mL 11  . losartan (COZAAR) 50 MG tablet TAKE 1 TABLET EVERY DAY 90 tablet 0  . meloxicam (MOBIC) 15 MG tablet Take 1 tablet (15 mg total) by mouth daily. 90 tablet 0  . metoprolol succinate (TOPROL-XL) 25 MG 24 hr tablet TAKE 1 TABLET EVERY DAY 90 tablet 0  . naproxen sodium (ANAPROX) 220 MG tablet Take 220 mg by mouth as needed (for pain).     . Omega-3 Fatty Acids (FISH OIL PO) Take 2,400 mg by mouth daily.     Marland Kitchen omeprazole (PRILOSEC) 20 MG capsule TAKE 1 CAPSULE EVERY DAY 90 capsule 0  . triamcinolone cream (KENALOG) 0.1 % Apply 1 application topically 4 (four) times daily. As needed for itching 80 g 2  . TRUEPLUS LANCETS 33G MISC TEST BLOOD SUGAR TWICE DAILY 200 each 2   No current facility-administered medications on file prior to visit.    No Known Allergies  Family History  Problem Relation Age of Onset  . Arthritis Mother   . Cancer Mother        Ovarian  . Diabetes Mother   . Heart disease Father   . Hypertension Father      BP (!) 144/70 (BP Location: Left Arm, Patient Position: Sitting, Cuff Size: Large)   Pulse 75   Ht 5\' 7"  (1.702 m)   Wt (!) 294 lb (133.4 kg)   SpO2 95%   BMI 46.05 kg/m    Review of Systems     Objective:   Physical Exam VITAL SIGNS:  See vs page GENERAL: no distress Pulses: dorsalis pedis intact bilat.   MSK: no  deformity of the feet CV: no leg edema Skin:  no ulcer on the feet.  normal color and temp on the feet. Neuro: sensation is intact to touch on the feet  Lab Results  Component Value Date   HGBA1C 10.3 (A) 08/09/2019   Lab Results  Component Value Date   CREATININE 1.09 12/22/2018   BUN 17 12/22/2018   NA 139 12/22/2018   K 4.0 12/22/2018   CL 99 12/22/2018   CO2 30 12/22/2018   Lab Results  Component Value Date   TSH 2.38 07/21/2017   Lab Results  Component Value Date   CALCIUM 9.9 12/22/2018       Assessment & Plan:  Insulin-requiring type 2 DM, with PN: worse.  Hypoglycemia, due to insulin: we have to improve this before we can increase insulin.   Patient Instructions  Please continue the same insulin.   Blood tests are requested for you today.  We'll let you know about the results.   On this type of insulin schedule, you should eat meals on a regular schedule (especially lunch).  If a meal is missed or significantly delayed, your blood sugar could go low.   check your blood sugar twice a day.  vary the time of day when you check, between before the 3 meals, and at bedtime.  also check if you have symptoms of your blood sugar being too high or too low.  please keep a record of the readings and bring it to your next appointment here (or you can bring the meter itself).  You can write it on any piece of paper.  please call us sooner if your blood sugar goes below 70, or if you have a lot of readings over 200.   Please come back for a follow-up appointment in 2 months.

## 2019-08-10 ENCOUNTER — Telehealth: Payer: Self-pay

## 2019-08-10 ENCOUNTER — Telehealth: Payer: Self-pay | Admitting: Endocrinology

## 2019-08-10 DIAGNOSIS — E559 Vitamin D deficiency, unspecified: Secondary | ICD-10-CM

## 2019-08-10 DIAGNOSIS — N289 Disorder of kidney and ureter, unspecified: Secondary | ICD-10-CM

## 2019-08-10 LAB — PTH, INTACT AND CALCIUM
Calcium: 9.3 mg/dL (ref 8.6–10.4)
PTH: 99 pg/mL — ABNORMAL HIGH (ref 14–64)

## 2019-08-10 NOTE — Telephone Encounter (Signed)
Patient requests to be called at ph# [phone] re: Patient received a MyChart message  From Dr. Everardo All that stated patient needs to take Vitamin D. Patient wants to know if Dr. Everardo All is going to prescribe the Vitamin D. Patient states she has not been taking any Vitamin D. Patient's PHARM is: [facility] [address], [place] - [address] Phone:  [phone]  Fax:  [phone]

## 2019-08-10 NOTE — Telephone Encounter (Signed)
Returned pt call and advised Rx is not needed as this medication can be found OTC. Verbalized acceptance and understanding.

## 2019-08-10 NOTE — Telephone Encounter (Signed)
Pt left v/m that pt recently saw Dr Everardo All and pt was advised to take Vit D 2000 units daily(pt wants to know who is calling in this med; ?OTC) and FU with R Baity NP about slightly worse kidney function. Melanie CMA said to send to Pamala Hurry NP to see if pt needs appt to see Pamala Hurry NP or can this be done over phone and Shawna Orleans CMA will advise pt at cb about Vit D being OTC at that time. Pt last seen for annual medicare wellness 12/23/2018. No future appt scheduled at this time.

## 2019-08-10 NOTE — Telephone Encounter (Signed)
Needs 50000 units Ergocalciferol weekly x12 weeks #12, 0 refills. Needs to stop Meloxicam OTC and Naproxen if taking. Encouraged adequate water intake. Will repeat BMET in 2 weeks, lab only.

## 2019-08-11 MED ORDER — VITAMIN D (ERGOCALCIFEROL) 1.25 MG (50000 UNIT) PO CAPS: 50000.0000 [IU] | ORAL_CAPSULE | ORAL | 0 refills | Status: DC

## 2019-08-11 NOTE — Addendum Note (Signed)
Addended by: Roena Malady on: 08/11/2019 04:48 PM   Modules accepted: Orders

## 2019-08-11 NOTE — Telephone Encounter (Signed)
Left detailed msg on VM per HIPAA Rx sent through [facility] future ordered 

## 2019-08-13 NOTE — Telephone Encounter (Signed)
Pt is aware... lab appt scheduled

## 2019-08-26 ENCOUNTER — Other Ambulatory Visit: Payer: Self-pay

## 2019-08-26 ENCOUNTER — Other Ambulatory Visit (INDEPENDENT_AMBULATORY_CARE_PROVIDER_SITE_OTHER): Payer: Medicare HMO

## 2019-08-26 DIAGNOSIS — N289 Disorder of kidney and ureter, unspecified: Secondary | ICD-10-CM | POA: Diagnosis not present

## 2019-08-26 LAB — BASIC METABOLIC PANEL
BUN: 15 mg/dL (ref 6–23)
CO2: 30 mEq/L (ref 19–32)
Calcium: 9.6 mg/dL (ref 8.4–10.5)
Chloride: 101 mEq/L (ref 96–112)
Creatinine, Ser: 1.11 mg/dL (ref 0.40–1.20)
GFR: 57.75 mL/min — ABNORMAL LOW (ref >60.00)
Glucose, Bld: 208 mg/dL — ABNORMAL HIGH (ref 70–99)
Potassium: 4 mEq/L (ref 3.5–5.1)
Sodium: 138 mEq/L (ref 135–145)

## 2019-10-07 ENCOUNTER — Other Ambulatory Visit: Payer: Self-pay

## 2019-10-07 ENCOUNTER — Ambulatory Visit (INDEPENDENT_AMBULATORY_CARE_PROVIDER_SITE_OTHER): Payer: Medicare HMO | Admitting: Internal Medicine

## 2019-10-07 ENCOUNTER — Encounter: Payer: Self-pay | Admitting: Internal Medicine

## 2019-10-07 VITALS — BP 140/86 | HR 102 | Temp 97.5°F | Wt 291.0 lb

## 2019-10-07 DIAGNOSIS — R0602 Shortness of breath: Secondary | ICD-10-CM

## 2019-10-07 DIAGNOSIS — R519 Headache, unspecified: Secondary | ICD-10-CM | POA: Diagnosis not present

## 2019-10-07 DIAGNOSIS — R112 Nausea with vomiting, unspecified: Secondary | ICD-10-CM

## 2019-10-07 DIAGNOSIS — R197 Diarrhea, unspecified: Secondary | ICD-10-CM

## 2019-10-07 DIAGNOSIS — R5383 Other fatigue: Secondary | ICD-10-CM | POA: Diagnosis not present

## 2019-10-07 DIAGNOSIS — R059 Cough, unspecified: Secondary | ICD-10-CM

## 2019-10-07 DIAGNOSIS — R432 Parageusia: Secondary | ICD-10-CM

## 2019-10-07 DIAGNOSIS — R05 Cough: Secondary | ICD-10-CM | POA: Diagnosis not present

## 2019-10-07 DIAGNOSIS — R430 Anosmia: Secondary | ICD-10-CM

## 2019-10-07 DIAGNOSIS — R52 Pain, unspecified: Secondary | ICD-10-CM

## 2019-10-07 DIAGNOSIS — M25561 Pain in right knee: Secondary | ICD-10-CM | POA: Diagnosis not present

## 2019-10-07 MED ORDER — ONDANSETRON HCL 4 MG PO TABS: 4.0000 mg | ORAL_TABLET | Freq: Three times a day (TID) | ORAL | 0 refills | Status: DC | PRN

## 2019-10-07 NOTE — Progress Notes (Signed)
Subjective:    Patient ID: Bethany [name], female    DOB: 17-Jan-1945, 74 y.o.   MRN: [id number]  HPI  Pt presents to the clinic today with c/o fatigue, headache, cough, shortness of breath, nausea, vomiting and diarrhea. She reports this started 2 weeks ago. The headache is located in her temples. She describes the pain as pressure. She reports associated lightheadedness but denies visual changes. The cough is productive of dark grey/green mucous. She reports SOB is worse with exertion but she denies chest pain. She denies eye pain/redness, discharge, ear pain, runny nose, nasal congestion or sore throat but has lost her sense of taste/smell. She denies fever or chills but has had body aches. She reports poor appetite and weakness because "food runs right through me". She reports she has recently been sleeping in her recliner because she twisted her right knee in the shower. She describes the pain as tender to touch. The pain radiates up the back of her leg. She denies numbness, tingling or weakness of her RLE associated with this knee pain. She has not noticed any swelling, bruising or warmth. She has tried Thera Flu, Tylenol and Voltaren gel with minimal relief of her knee pain or URI symptoms. She denies any recent med or diet changes that could be contributing to her symptoms. Her blood sugars range 57-270.  She reports her family has recently had Covid but she denies direct contact with them. She has had her Covid vaccines.   Review of Systems      Past Medical History:  Diagnosis Date  . Arthritis   . Diabetes mellitus without complication (HCC)    Type II  . Fibromyalgia   . GERD (gastroesophageal reflux disease)    better after gall bladder surgery-   . Hyperlipidemia   . Hypertension   . Shortness of breath dyspnea    with exertion    Current Outpatient Medications  Medication Sig Dispense Refill  . Alcohol Swabs (B-D SINGLE USE SWABS REGULAR) PADS USE TWICE DAILY 200 each 2    . amLODipine (NORVASC) 10 MG tablet TAKE 1 TABLET EVERY DAY 90 tablet 0  . aspirin 81 MG tablet Take 81 mg by mouth daily. Reported on 03/29/2015    . atorvastatin (LIPITOR) 20 MG tablet TAKE 1 TABLET EVERY DAY 90 tablet 0  . Blood Glucose Monitoring Suppl (TRUE METRIX METER) DEVI by Does not apply route.    . diclofenac Sodium (VOLTAREN) 1 % GEL Voltaren 1 % topical gel  APPLY 2 GRAMS TO THE AFFECTED AREA(S) BY TOPICAL ROUTE 4 TIMES PER DAY    . docusate sodium (COLACE) 100 MG capsule Take 100 mg by mouth daily as needed for mild constipation. Reported on 03/29/2015    . fluticasone (FLONASE) 50 MCG/ACT nasal spray Place 2 sprays into both nostrils daily. 16 g 6  . furosemide (LASIX) 20 MG tablet TAKE 1 TABLET EVERY DAY AS NEEDED 90 tablet 0  . glucose blood (TRUE METRIX BLOOD GLUCOSE TEST) test strip 1 each by Other route 4 (four) times daily. 400 each 1  . insulin NPH-regular Human (NOVOLIN 70/30) (70-30) 100 UNIT/ML injection Inject 55 Units into the skin daily with breakfast. And syringes 1/day. (Patient taking differently: Inject 60 Units into the skin daily with breakfast. And syringes 1/day.) 60 mL 11  . losartan (COZAAR) 50 MG tablet TAKE 1 TABLET EVERY DAY 90 tablet 0  . meloxicam (MOBIC) 15 MG tablet Take 1 tablet (15 mg total) by mouth  daily. 90 tablet 0  . metoprolol succinate (TOPROL-XL) 25 MG 24 hr tablet TAKE 1 TABLET EVERY DAY 90 tablet 0  . naproxen sodium (ANAPROX) 220 MG tablet Take 220 mg by mouth as needed (for pain).     . Omega-3 Fatty Acids (FISH OIL PO) Take 2,400 mg by mouth daily.     Marland Kitchen omeprazole (PRILOSEC) 20 MG capsule TAKE 1 CAPSULE EVERY DAY 90 capsule 0  . triamcinolone cream (KENALOG) 0.1 % Apply 1 application topically 4 (four) times daily. As needed for itching 80 g 2  . TRUEPLUS LANCETS 33G MISC TEST BLOOD SUGAR TWICE DAILY 200 each 2  . Vitamin D, Ergocalciferol, (DRISDOL) 1.25 MG (50000 UNIT) CAPS capsule Take 1 capsule (50,000 Units total) by mouth every 7  (seven) days. 12 capsule 0   No current facility-administered medications for this visit.    No Known Allergies  Family History  Problem Relation Age of Onset  . Arthritis Mother   . Cancer Mother        Ovarian  . Diabetes Mother   . Heart disease Father   . Hypertension Father     Social History   Socioeconomic History  . Marital status: Married    Spouse name: Not on file  . Number of children: Not on file  . Years of education: Not on file  . Highest education level: Not on file  Occupational History  . Not on file  Tobacco Use  . Smoking status: Never Smoker  . Smokeless tobacco: Never Used  Substance and Sexual Activity  . Alcohol use: No  . Drug use: No  . Sexual activity: Not Currently  Other Topics Concern  . Not on file  Social History Narrative  . Not on file   Social Determinants of Health   Financial Resource Strain:   . Difficulty of Paying Living Expenses: Not on file  Food Insecurity:   . Worried About Programme researcher, broadcasting/film/video in the Last Year: Not on file  . Ran Out of Food in the Last Year: Not on file  Transportation Needs:   . Lack of Transportation (Medical): Not on file  . Lack of Transportation (Non-Medical): Not on file  Physical Activity:   . Days of Exercise per Week: Not on file  . Minutes of Exercise per Session: Not on file  Stress:   . Feeling of Stress : Not on file  Social Connections:   . Frequency of Communication with Friends and Family: Not on file  . Frequency of Social Gatherings with Friends and Family: Not on file  . Attends Religious Services: Not on file  . Active Member of Clubs or Organizations: Not on file  . Attends Banker Meetings: Not on file  . Marital Status: Not on file  Intimate Partner Violence:   . Fear of Current or Ex-Partner: Not on file  . Emotionally Abused: Not on file  . Physically Abused: Not on file  . Sexually Abused: Not on file     Constitutional: Pt reports fatigue,  malaise, headaches. Denies fever, or abrupt weight changes.  HEENT: Denies eye pain, eye redness, ear pain, ringing in the ears, wax buildup, runny nose, nasal congestion, bloody nose, or sore throat. Respiratory: Pt reports cough and SOB. Denies difficulty breathing.   Cardiovascular: Denies chest pain, chest tightness, palpitations or swelling in the hands or feet.  Gastrointestinal: Pt reports nausea, vomiting and diarrhea. Denies abdominal pain, bloating, constipation,or blood in the stool.  GU: Denies urgency, frequency, pain with urination, burning sensation, blood in urine, odor or discharge. Musculoskeletal: Pt reports body aches, right knee pain. Denies decrease in range of motion, difficulty with gait, or joint swelling.  Skin: Denies redness, rashes, lesions or ulcercations.  Neurological: Pt reports lightheadedness. Denies difficulty with memory, difficulty with speech or problems with balance and coordination.  Psych: Pt reports feeling down because she has not felt well in 2 weeks. Denies anxiety, depression, SI/HI.  No other specific complaints in a complete review of systems (except as listed in HPI above).  Objective:   Physical Exam  BP 140/86   Pulse (!) 102   Temp (!) 97.5 F (36.4 C) (Temporal)   Wt 291 lb (132 kg)   SpO2 96%   BMI 45.58 kg/m   Wt Readings from Last 3 Encounters:  08/09/19 (!) 294 lb (133.4 kg)  06/08/19 (!) 306 lb (138.8 kg)  04/06/19 (!) 306 lb (138.8 kg)    General: Appears her stated age, obese, ill appearing but in NAD. Skin: Warm, dry and intact. No rashes noted. HEENT: Head: normal shape and size; Eyes: sclera white, no icterus, conjunctiva pink, PERRLA and EOMs intact; Ears: Tm's gray and intact, normal light reflex;  Neck:  No adenopathy noted.  Cardiovascular: Tachycardic with normal rhythm. Thick calves with 1+ ankle edema. Pulmonary/Chest: Normal effort and positive vesicular breath sounds. No respiratory distress. No wheezes,  rales or ronchi noted.  Abdomen: Soft and nontender. Hypoactive bowel sounds. No distention or masses noted. Musculoskeletal: Decreased flexion and extension of the right knee. No joint swelling but joint enlargement noted. Pain with palpation over the right medial knee. Strength 5/5 LLE, 4/5 RLE. She has obvious trouble getting from a sitting to standing position. Gait slow, slightly unsteady with use of cane.  Neurological: Alert and oriented.  Psychiatric: Mood and affect flat.  BMET    Component Value Date/Time   NA 138 08/26/2019 1102   K 4.0 08/26/2019 1102   CL 101 08/26/2019 1102   CO2 30 08/26/2019 1102   GLUCOSE 208 (H) 08/26/2019 1102   BUN 15 08/26/2019 1102   CREATININE 1.11 08/26/2019 1102   CALCIUM 9.6 08/26/2019 1102   GFRNONAA 55 (L) 10/20/2014 1039   GFRAA >60 10/20/2014 1039    Lipid Panel     Component Value Date/Time   CHOL 128 06/29/2018 1231   TRIG 132.0 06/29/2018 1231   HDL 42.70 06/29/2018 1231   CHOLHDL 3 06/29/2018 1231   VLDL 26.4 06/29/2018 1231   LDLCALC 59 06/29/2018 1231    CBC    Component Value Date/Time   WBC 7.3 06/29/2018 1231   RBC 4.63 06/29/2018 1231   HGB 12.2 06/29/2018 1231   HCT 38.0 06/29/2018 1231   PLT 279.0 06/29/2018 1231   MCV 82.0 06/29/2018 1231   MCH 25.6 (L) 10/20/2014 1039   MCHC 32.1 06/29/2018 1231   RDW 14.9 06/29/2018 1231   LYMPHSABS 2.2 09/06/2014 1048   MONOABS 0.5 09/06/2014 1048   EOSABS 0.3 09/06/2014 1048   BASOSABS 0.0 09/06/2014 1048    Hgb A1C Lab Results  Component Value Date   HGBA1C 10.3 (A) 08/09/2019          Assessment & Plan:   Acute Headache, Loss of Tast/Smell, Cough, SOB, N/V/D, Fatigue and Body Aches:  High suspicion for covid Appropriate PPE donned and pt Covid tested today RX for Zofran 4 mg TID prn Discussed rest and fluids Discussed self quarantine, masking, social distancing  and frequent handwashing until test results are back If positive, too far out for  monoclonal antibody testing If negative, would want to check CBC, CMET, TSH, Vit D and B12 as well as chest xray  Right Knee Pain:  Offered RX for Tramadol- she declines reporting this makes her too sleepy Advised her to continue Tylenol, and Voltaren gel She reports she may be having back surgery soon, advised her to mention this knee pain to her orthopedist Could obtain xray pending Covid results  Will follow up after lab is back, ER/return precautions discussed   Nicki Reaper, NP This visit occurred during the SARS-CoV-2 public health emergency.  Safety protocols were in place, including screening questions prior to the visit, additional usage of staff PPE, and extensive cleaning of exam room while observing appropriate contact time as indicated for disinfecting solutions.

## 2019-10-07 NOTE — Patient Instructions (Signed)
COVID-19 COVID-19 is a respiratory infection that is caused by a virus called severe acute respiratory syndrome coronavirus 2 (SARS-CoV-2). The disease is also known as coronavirus disease or novel coronavirus. In some people, the virus may not cause any symptoms. In others, it may cause a serious infection. The infection can get worse quickly and can lead to complications, such as:  Pneumonia, or infection of the lungs.  Acute respiratory distress syndrome or ARDS. This is a condition in which fluid build-up in the lungs prevents the lungs from filling with air and passing oxygen into the blood.  Acute respiratory failure. This is a condition in which there is not enough oxygen passing from the lungs to the body or when carbon dioxide is not passing from the lungs out of the body.  Sepsis or septic shock. This is a serious bodily reaction to an infection.  Blood clotting problems.  Secondary infections due to bacteria or fungus.  Organ failure. This is when your body's organs stop working. The virus that causes COVID-19 is contagious. This means that it can spread from person to person through droplets from coughs and sneezes (respiratory secretions). What are the causes? This illness is caused by a virus. You may catch the virus by:  Breathing in droplets from an infected person. Droplets can be spread by a person breathing, speaking, singing, coughing, or sneezing.  Touching something, like a table or a doorknob, that was exposed to the virus (contaminated) and then touching your mouth, nose, or eyes. What increases the risk? Risk for infection You are more likely to be infected with this virus if you:  Are within 6 feet (2 meters) of a person with COVID-19.  Provide care for or live with a person who is infected with COVID-19.  Spend time in crowded indoor spaces or live in shared housing. Risk for serious illness You are more likely to become seriously ill from the virus if you:   Are 50 years of age or older. The higher your age, the more you are at risk for serious illness.  Live in a nursing home or long-term care facility.  Have cancer.  Have a long-term (chronic) disease such as: ? Chronic lung disease, including chronic obstructive pulmonary disease or asthma. ? A long-term disease that lowers your body's ability to fight infection (immunocompromised). ? Heart disease, including heart failure, a condition in which the arteries that lead to the heart become narrow or blocked (coronary artery disease), a disease which makes the heart muscle thick, weak, or stiff (cardiomyopathy). ? Diabetes. ? Chronic kidney disease. ? Sickle cell disease, a condition in which red blood cells have an abnormal "sickle" shape. ? Liver disease.  Are obese. What are the signs or symptoms? Symptoms of this condition can range from mild to severe. Symptoms may appear any time from 2 to 14 days after being exposed to the virus. They include:  A fever or chills.  A cough.  Difficulty breathing.  Headaches, body aches, or muscle aches.  Runny or stuffy (congested) nose.  A sore throat.  New loss of taste or smell. Some people may also have stomach problems, such as nausea, vomiting, or diarrhea. Other people may not have any symptoms of COVID-19. How is this diagnosed? This condition may be diagnosed based on:  Your signs and symptoms, especially if: ? You live in an area with a COVID-19 outbreak. ? You recently traveled to or from an area where the virus is common. ? You   provide care for or live with a person who was diagnosed with COVID-19. ? You were exposed to a person who was diagnosed with COVID-19.  A physical exam.  Lab tests, which may include: ? Taking a sample of fluid from the back of your nose and throat (nasopharyngeal fluid), your nose, or your throat using a swab. ? A sample of mucus from your lungs (sputum). ? Blood tests.  Imaging tests, which  may include, X-rays, CT scan, or ultrasound. How is this treated? At present, there is no medicine to treat COVID-19. Medicines that treat other diseases are being used on a trial basis to see if they are effective against COVID-19. Your health care provider will talk with you about ways to treat your symptoms. For most people, the infection is mild and can be managed at home with rest, fluids, and over-the-counter medicines. Treatment for a serious infection usually takes places in a hospital intensive care unit (ICU). It may include one or more of the following treatments. These treatments are given until your symptoms improve.  Receiving fluids and medicines through an IV.  Supplemental oxygen. Extra oxygen is given through a tube in the nose, a face mask, or a hood.  Positioning you to lie on your stomach (prone position). This makes it easier for oxygen to get into the lungs.  Continuous positive airway pressure (CPAP) or bi-level positive airway pressure (BPAP) machine. This treatment uses mild air pressure to keep the airways open. A tube that is connected to a motor delivers oxygen to the body.  Ventilator. This treatment moves air into and out of the lungs by using a tube that is placed in your windpipe.  Tracheostomy. This is a procedure to create a hole in the neck so that a breathing tube can be inserted.  Extracorporeal membrane oxygenation (ECMO). This procedure gives the lungs a chance to recover by taking over the functions of the heart and lungs. It supplies oxygen to the body and removes carbon dioxide. Follow these instructions at home: Lifestyle  If you are sick, stay home except to get medical care. Your health care provider will tell you how long to stay home. Call your health care provider before you go for medical care.  Rest at home as told by your health care provider.  Do not use any products that contain nicotine or tobacco, such as cigarettes, e-cigarettes, and  chewing tobacco. If you need help quitting, ask your health care provider.  Return to your normal activities as told by your health care provider. Ask your health care provider what activities are safe for you. General instructions  Take over-the-counter and prescription medicines only as told by your health care provider.  Drink enough fluid to keep your urine pale yellow.  Keep all follow-up visits as told by your health care provider. This is important. How is this prevented?  There is no vaccine to help prevent COVID-19 infection. However, there are steps you can take to protect yourself and others from this virus. To protect yourself:   Do not travel to areas where COVID-19 is a risk. The areas where COVID-19 is reported change often. To identify high-risk areas and travel restrictions, check the CDC travel website: wwwnc.cdc.gov/travel/notices  If you live in, or must travel to, an area where COVID-19 is a risk, take precautions to avoid infection. ? Stay away from people who are sick. ? Wash your hands often with soap and water for 20 seconds. If soap and water   are not available, use an alcohol-based hand sanitizer. ? Avoid touching your mouth, face, eyes, or nose. ? Avoid going out in public, follow guidance from your state and local health authorities. ? If you must go out in public, wear a cloth face covering or face mask. Make sure your mask covers your nose and mouth. ? Avoid crowded indoor spaces. Stay at least 6 feet (2 meters) away from others. ? Disinfect objects and surfaces that are frequently touched every day. This may include:  Counters and tables.  Doorknobs and light switches.  Sinks and faucets.  Electronics, such as phones, remote controls, keyboards, computers, and tablets. To protect others: If you have symptoms of COVID-19, take steps to prevent the virus from spreading to others.  If you think you have a COVID-19 infection, contact your health care  provider right away. Tell your health care team that you think you may have a COVID-19 infection.  Stay home. Leave your house only to seek medical care. Do not use public transport.  Do not travel while you are sick.  Wash your hands often with soap and water for 20 seconds. If soap and water are not available, use alcohol-based hand sanitizer.  Stay away from other members of your household. Let healthy household members care for children and pets, if possible. If you have to care for children or pets, wash your hands often and wear a mask. If possible, stay in your own room, separate from others. Use a different bathroom.  Make sure that all people in your household wash their hands well and often.  Cough or sneeze into a tissue or your sleeve or elbow. Do not cough or sneeze into your hand or into the air.  Wear a cloth face covering or face mask. Make sure your mask covers your nose and mouth. Where to find more information  Centers for Disease Control and Prevention: www.cdc.gov/coronavirus/2019-ncov/index.html  World Health Organization: www.who.int/health-topics/coronavirus Contact a health care provider if:  You live in or have traveled to an area where COVID-19 is a risk and you have symptoms of the infection.  You have had contact with someone who has COVID-19 and you have symptoms of the infection. Get help right away if:  You have trouble breathing.  You have pain or pressure in your chest.  You have confusion.  You have bluish lips and fingernails.  You have difficulty waking from sleep.  You have symptoms that get worse. These symptoms may represent a serious problem that is an emergency. Do not wait to see if the symptoms will go away. Get medical help right away. Call your local emergency services (911 in the [place] Do not drive yourself to the hospital. Let the emergency medical personnel know if you think you have COVID-19. Summary  COVID-19 is a  respiratory infection that is caused by a virus. It is also known as coronavirus disease or novel coronavirus. It can cause serious infections, such as pneumonia, acute respiratory distress syndrome, acute respiratory failure, or sepsis.  The virus that causes COVID-19 is contagious. This means that it can spread from person to person through droplets from breathing, speaking, singing, coughing, or sneezing.  You are more likely to develop a serious illness if you are 50 years of age or older, have a weak immune system, live in a nursing home, or have chronic disease.  There is no medicine to treat COVID-19. Your health care provider will talk with you about ways to treat your symptoms.    Take steps to protect yourself and others from infection. Wash your hands often and disinfect objects and surfaces that are frequently touched every day. Stay away from people who are sick and wear a mask if you are sick. This information is not intended to replace advice given to you by your health care provider. Make sure you discuss any questions you have with your health care provider. Document Revised: 10/30/2018 Document Reviewed: 02/05/2018 Elsevier Patient Education  2020 [employer].  

## 2019-10-08 ENCOUNTER — Telehealth: Payer: Self-pay | Admitting: *Deleted

## 2019-10-08 NOTE — Telephone Encounter (Signed)
Pt is aware that results are still pending

## 2019-10-08 NOTE — Telephone Encounter (Signed)
Pt left VM at Triage asking for the status of her covid test. She said she hopes we didn't do a 72 hr test. Pt wants call back today to let her know something so she knows what to do this weekend

## 2019-10-09 LAB — SARS-COV-2, NAA 2 DAY TAT

## 2019-10-09 LAB — NOVEL CORONAVIRUS, NAA: SARS-CoV-2, NAA: DETECTED — AB

## 2019-10-10 ENCOUNTER — Encounter: Payer: Self-pay | Admitting: Internal Medicine

## 2019-10-18 ENCOUNTER — Ambulatory Visit: Payer: Medicare HMO | Admitting: Endocrinology

## 2019-11-18 DIAGNOSIS — M1711 Unilateral primary osteoarthritis, right knee: Secondary | ICD-10-CM | POA: Diagnosis not present

## 2019-11-19 ENCOUNTER — Other Ambulatory Visit: Payer: Self-pay | Admitting: *Deleted

## 2019-11-19 DIAGNOSIS — E7800 Pure hypercholesterolemia, unspecified: Secondary | ICD-10-CM

## 2019-11-19 DIAGNOSIS — K219 Gastro-esophageal reflux disease without esophagitis: Secondary | ICD-10-CM

## 2019-11-19 NOTE — Telephone Encounter (Signed)
Last office visit 10/07/2019 for depression.  Last refilled 01/29/2019 for #90 with no refills.  Last Lipid 06/29/2018.  No future appointments.  Refill??

## 2019-11-20 NOTE — Telephone Encounter (Signed)
Can you call and see if she has been taking? She needs to schedule her AWV

## 2019-11-23 MED ORDER — LOSARTAN POTASSIUM 50 MG PO TABS
50.0000 mg | ORAL_TABLET | Freq: Every day | ORAL | 0 refills | Status: DC
Start: 2019-11-23 — End: 2020-02-16

## 2019-11-23 MED ORDER — OMEPRAZOLE 20 MG PO CPDR: 20.0000 mg | DELAYED_RELEASE_CAPSULE | Freq: Every day | ORAL | 0 refills | Status: DC

## 2019-11-23 MED ORDER — ATORVASTATIN CALCIUM 20 MG PO TABS: 20.0000 mg | ORAL_TABLET | Freq: Every day | ORAL | 0 refills | Status: DC

## 2019-11-23 NOTE — Telephone Encounter (Signed)
Pt calling to ck on status of refills for omeprazole,losartan, and atorvastatin thru [facility] pharmacy. Pt said she is still taking the omeprazole,losartan, and atorvastatin. Pt said [facility] pharmacy sends pt 6 months of refills at a time.pt has had extra meds due to sending 6 months at a time at 3 mth intervals. I refilled omeprazole,losartan and atorvastatin for 3 mth refills on each med and pt scheduled annual medicare wellness on 01/27/20 with labs same day. Pt aware refills done.Nicki Reaper NP said OK to do 3 mth refill on each med. Refills done per protocol to [facility] pharmacy.

## 2020-01-24 ENCOUNTER — Other Ambulatory Visit: Payer: Self-pay | Admitting: Internal Medicine

## 2020-01-24 DIAGNOSIS — K219 Gastro-esophageal reflux disease without esophagitis: Secondary | ICD-10-CM

## 2020-01-24 DIAGNOSIS — E7800 Pure hypercholesterolemia, unspecified: Secondary | ICD-10-CM

## 2020-01-26 ENCOUNTER — Ambulatory Visit: Payer: Medicare HMO | Admitting: Endocrinology

## 2020-01-26 ENCOUNTER — Encounter: Payer: Self-pay | Admitting: Endocrinology

## 2020-01-26 ENCOUNTER — Other Ambulatory Visit: Payer: Self-pay

## 2020-01-26 VITALS — BP 160/72 | HR 86 | Wt 305.0 lb

## 2020-01-26 DIAGNOSIS — E118 Type 2 diabetes mellitus with unspecified complications: Secondary | ICD-10-CM | POA: Diagnosis not present

## 2020-01-26 DIAGNOSIS — E1165 Type 2 diabetes mellitus with hyperglycemia: Secondary | ICD-10-CM | POA: Diagnosis not present

## 2020-01-26 DIAGNOSIS — IMO0002 Reserved for concepts with insufficient information to code with codable children: Secondary | ICD-10-CM

## 2020-01-26 LAB — POCT GLYCOSYLATED HEMOGLOBIN (HGB A1C): Hemoglobin A1C: 7.7 % — AB (ref 4.0–5.6)

## 2020-01-26 MED ORDER — NOVOLIN 70/30 (70-30) 100 UNIT/ML ~~LOC~~ SUSP: 55.0000 [IU] | Freq: Every day | SUBCUTANEOUS | 11 refills | Status: DC

## 2020-01-26 NOTE — Telephone Encounter (Signed)
Pt has CPE tomorrow and should have enough Rx through 02/2020, will wait to refill

## 2020-01-26 NOTE — Progress Notes (Signed)
Subjective:    Patient ID: Bethany [name], female    DOB: 18-Aug-1945, 75 y.o.   MRN: [id number]  HPI Pt returns for f/u of diabetes mellitus: DM type: Insulin-requiring type 2 Dx'ed: 1998.  Complications: PN and renal insuff Therapy: insulin since 2009.  GDM: never DKA: never Severe hypoglycemia: once (2020). Pancreatitis: never. Other: she takes QD insulin, after poor results with multiple daily injections, but she often eats just 2 meals per day; she changed to 70/30, due to pattern of cbg's.   SDOH: she takes human insulin, due to cost.   Interval history: no cbg record, but states cbg varies from 75-350.  It is in general lowest when lunch is delayed.  She had coronvirus 6 weeks ago, but no steroids.  However, she missed her insulin x 1 month during the illness.   Past Medical History:  Diagnosis Date  . Arthritis   . Diabetes mellitus without complication (HCC)    Type II  . Fibromyalgia   . GERD (gastroesophageal reflux disease)    better after gall bladder surgery-   . Hyperlipidemia   . Hypertension   . Shortness of breath dyspnea    with exertion    Past Surgical History:  Procedure Laterality Date  . ABDOMINAL HYSTERECTOMY  1997  . CATARACT EXTRACTION Bilateral 2014  . CHOLECYSTECTOMY  2010  . COLONOSCOPY    . COLONOSCOPY WITH PROPOFOL N/A 04/21/2015   Procedure: COLONOSCOPY WITH PROPOFOL;  Surgeon: Wallace Cullens, MD;  Location: [facility] ENDOSCOPY;  Service: Gastroenterology;  Laterality: N/A;  . EYE SURGERY Bilateral    Cataract  . LUMBAR WOUND DEBRIDEMENT N/A 10/20/2014   Procedure: LUMBAR WOUND DEBRIDEMENT;  Surgeon: Tia Alert, MD;  Location: [facility] NEURO ORS;  Service: Neurosurgery;  Laterality: N/A;  . MAXIMUM ACCESS (MAS)POSTERIOR LUMBAR INTERBODY FUSION (PLIF) 1 LEVEL N/A 09/16/2014   Procedure: L/4-5 FOR MAXIMUM ACCESS (MAS) POSTERIOR LUMBAR INTERBODY FUSION (PLIF) 1 LEVEL;  Surgeon: Tia Alert, MD;  Location: [facility] NEURO ORS;  Service: Neurosurgery;  Laterality:  N/A;  L/4-5 FOR MAXIMUM ACCESS (MAS) POSTERIOR LUMBAR INTERBODY FUSION (PLIF) 1 LEVEL    Social History   Socioeconomic History  . Marital status: Married    Spouse name: Not on file  . Number of children: Not on file  . Years of education: Not on file  . Highest education level: Not on file  Occupational History  . Not on file  Tobacco Use  . Smoking status: Never Smoker  . Smokeless tobacco: Never Used  Substance and Sexual Activity  . Alcohol use: No  . Drug use: No  . Sexual activity: Not Currently  Other Topics Concern  . Not on file  Social History Narrative  . Not on file   Social Determinants of Health   Financial Resource Strain: Not on file  Food Insecurity: Not on file  Transportation Needs: Not on file  Physical Activity: Not on file  Stress: Not on file  Social Connections: Not on file  Intimate Partner Violence: Not on file    Current Outpatient Medications on File Prior to Visit  Medication Sig Dispense Refill  . Alcohol Swabs (B-D SINGLE USE SWABS REGULAR) PADS USE TWICE DAILY 200 each 2  . amLODipine (NORVASC) 10 MG tablet TAKE 1 TABLET EVERY DAY 90 tablet 0  . aspirin 81 MG tablet Take 81 mg by mouth daily. Reported on 03/29/2015    . atorvastatin (LIPITOR) 20 MG tablet Take 1 tablet (20 mg total)  by mouth daily. 90 tablet 0  . Blood Glucose Monitoring Suppl (TRUE METRIX METER) DEVI by Does not apply route.    . diclofenac Sodium (VOLTAREN) 1 % GEL Voltaren 1 % topical gel  APPLY 2 GRAMS TO THE AFFECTED AREA(S) BY TOPICAL ROUTE 4 TIMES PER DAY    . docusate sodium (COLACE) 100 MG capsule Take 100 mg by mouth daily as needed for mild constipation. Reported on 03/29/2015    . fluticasone (FLONASE) 50 MCG/ACT nasal spray Place 2 sprays into both nostrils daily. 16 g 6  . furosemide (LASIX) 20 MG tablet TAKE 1 TABLET EVERY DAY AS NEEDED 90 tablet 0  . glucose blood (TRUE METRIX BLOOD GLUCOSE TEST) test strip 1 each by Other route 4 (four) times daily. 400  each 1  . losartan (COZAAR) 50 MG tablet Take 1 tablet (50 mg total) by mouth daily. 90 tablet 0  . meloxicam (MOBIC) 15 MG tablet Take 1 tablet (15 mg total) by mouth daily. 90 tablet 0  . metoprolol succinate (TOPROL-XL) 25 MG 24 hr tablet TAKE 1 TABLET EVERY DAY 90 tablet 0  . naproxen sodium (ANAPROX) 220 MG tablet Take 220 mg by mouth as needed (for pain).     . Omega-3 Fatty Acids (FISH OIL PO) Take 2,400 mg by mouth daily.     Marland Kitchen omeprazole (PRILOSEC) 20 MG capsule Take 1 capsule (20 mg total) by mouth daily. 90 capsule 0  . ondansetron (ZOFRAN) 4 MG tablet Take 1 tablet (4 mg total) by mouth every 8 (eight) hours as needed. 20 tablet 0  . triamcinolone cream (KENALOG) 0.1 % Apply 1 application topically 4 (four) times daily. As needed for itching 80 g 2  . TRUEPLUS LANCETS 33G MISC TEST BLOOD SUGAR TWICE DAILY 200 each 2   No current facility-administered medications on file prior to visit.    No Known Allergies  Family History  Problem Relation Age of Onset  . Arthritis Mother   . Cancer Mother        Ovarian  . Diabetes Mother   . Heart disease Father   . Hypertension Father     BP (!) 160/72   Pulse 86   Wt (!) 305 lb (138.3 kg)   SpO2 99%   BMI 47.77 kg/m    Review of Systems She denies hypoglycemia.      Objective:   Physical Exam VITAL SIGNS:  See vs page GENERAL: no distress Pulses: dorsalis pedis intact bilat.   MSK: no deformity of the feet CV: 2+ bilat leg edema Skin:  no ulcer on the feet.  normal color and temp on the feet.  Neuro: sensation is intact to touch on the feet, but decreased from normal.   Ext: there is bilateral onychomycosis of the toenails.    Lab Results  Component Value Date   HGBA1C 7.7 (A) 01/26/2020       Assessment & Plan:  HTN: is noted today Insulin-requiring type 2 DM, with CRI: this is the best control this pt should aim for, given this regimen, which does match insulin to her changing needs throughout the  day  Patient Instructions  Your blood pressure is high today.  Please see your primary care provider soon, to have it rechecked Please continue the same insulin.     On this type of insulin schedule, you should eat meals on a regular schedule (especially lunch).  If a meal is missed or significantly delayed, your blood sugar could go  low.    check your blood sugar twice a day.  vary the time of day when you check, between before the 3 meals, and at bedtime.  also check if you have symptoms of your blood sugar being too high or too low.  please keep a record of the readings and bring it to your next appointment here (or you can bring the meter itself).  You can write it on any piece of paper.  please call us sooner if your blood sugar goes below 70, or if you have a lot of readings over 200.   Please come back for a follow-up appointment in 2 months.

## 2020-01-26 NOTE — Patient Instructions (Addendum)
Your blood pressure is high today.  Please see your primary care provider soon, to have it rechecked Please continue the same insulin.     On this type of insulin schedule, you should eat meals on a regular schedule (especially lunch).  If a meal is missed or significantly delayed, your blood sugar could go low.    check your blood sugar twice a day.  vary the time of day when you check, between before the 3 meals, and at bedtime.  also check if you have symptoms of your blood sugar being too high or too low.  please keep a record of the readings and bring it to your next appointment here (or you can bring the meter itself).  You can write it on any piece of paper.  please call us sooner if your blood sugar goes below 70, or if you have a lot of readings over 200.   Please come back for a follow-up appointment in 2 months.

## 2020-01-27 ENCOUNTER — Ambulatory Visit (INDEPENDENT_AMBULATORY_CARE_PROVIDER_SITE_OTHER): Payer: Medicare HMO | Admitting: Internal Medicine

## 2020-01-27 ENCOUNTER — Encounter: Payer: Self-pay | Admitting: Internal Medicine

## 2020-01-27 VITALS — BP 144/78 | HR 74 | Temp 98.0°F | Ht 67.0 in | Wt 306.0 lb

## 2020-01-27 DIAGNOSIS — E7800 Pure hypercholesterolemia, unspecified: Secondary | ICD-10-CM

## 2020-01-27 DIAGNOSIS — M797 Fibromyalgia: Secondary | ICD-10-CM | POA: Diagnosis not present

## 2020-01-27 DIAGNOSIS — K219 Gastro-esophageal reflux disease without esophagitis: Secondary | ICD-10-CM

## 2020-01-27 DIAGNOSIS — I5032 Chronic diastolic (congestive) heart failure: Secondary | ICD-10-CM | POA: Diagnosis not present

## 2020-01-27 DIAGNOSIS — Z0000 Encounter for general adult medical examination without abnormal findings: Secondary | ICD-10-CM | POA: Diagnosis not present

## 2020-01-27 DIAGNOSIS — M1990 Unspecified osteoarthritis, unspecified site: Secondary | ICD-10-CM | POA: Diagnosis not present

## 2020-01-27 DIAGNOSIS — IMO0002 Reserved for concepts with insufficient information to code with codable children: Secondary | ICD-10-CM

## 2020-01-27 DIAGNOSIS — E1165 Type 2 diabetes mellitus with hyperglycemia: Secondary | ICD-10-CM | POA: Diagnosis not present

## 2020-01-27 DIAGNOSIS — E118 Type 2 diabetes mellitus with unspecified complications: Secondary | ICD-10-CM

## 2020-01-27 DIAGNOSIS — I10 Essential (primary) hypertension: Secondary | ICD-10-CM | POA: Diagnosis not present

## 2020-01-27 NOTE — Progress Notes (Signed)
HPI:  Patient presents the clinic today for her subsequent annual Medicare wellness exam. She is also due to follow-up chronic conditions.  OA/Fibromyalgia: Mainly in her back and knees. She takes Meloxicam (2 x week) and Bengay as needed with some relief of symptoms.  DM2: [name] was 7.7%, 01/2020. Her sugars range 65-370. She is taking Novolin as prescribed. She checks her feet routinely. Her last eye exam was more than 1 year ago but she does have this scheduled. She follows with Dr. Everardo All.  CHF: She denies chronic cough or shortness of breath. She is taking Amlodipine, Losartan, Metoprolol and Furosemide as prescribed. Echo from 10/2017 reviewed.  GERD: She reports occasional breakthrough on Omeprazole for which she takes Tums with good relief of symptoms. There is no upper GI on file.  HLD: [name] was 59, 06/2018. She denies myalgias on Atorvastatin and Fish Oil. She tries to consume a low-fat diet.  HTN: Her BP today is 144/78. She is taking Metoprolol, Amlodipine, Losartan and Furosemide as prescribed. ECG from 02/2018 reviewed.  Past Medical History:  Diagnosis Date  . Arthritis   . Diabetes mellitus without complication (HCC)    Type II  . Fibromyalgia   . GERD (gastroesophageal reflux disease)    better after gall bladder surgery-   . Hyperlipidemia   . Hypertension   . Shortness of breath dyspnea    with exertion    Current Outpatient Medications  Medication Sig Dispense Refill  . Alcohol Swabs (B-D SINGLE USE SWABS REGULAR) PADS USE TWICE DAILY 200 each 2  . amLODipine (NORVASC) 10 MG tablet TAKE 1 TABLET EVERY DAY 90 tablet 0  . aspirin 81 MG tablet Take 81 mg by mouth daily. Reported on 03/29/2015    . atorvastatin (LIPITOR) 20 MG tablet Take 1 tablet (20 mg total) by mouth daily. 90 tablet 0  . Blood Glucose Monitoring Suppl (TRUE METRIX METER) DEVI by Does not apply route.    . diclofenac Sodium (VOLTAREN) 1 % GEL Voltaren 1 % topical gel  APPLY 2 GRAMS  TO THE AFFECTED AREA(S) BY TOPICAL ROUTE 4 TIMES PER DAY    . docusate sodium (COLACE) 100 MG capsule Take 100 mg by mouth daily as needed for mild constipation. Reported on 03/29/2015    . fluticasone (FLONASE) 50 MCG/ACT nasal spray Place 2 sprays into both nostrils daily. 16 g 6  . furosemide (LASIX) 20 MG tablet TAKE 1 TABLET EVERY DAY AS NEEDED 90 tablet 0  . glucose blood (TRUE METRIX BLOOD GLUCOSE TEST) test strip 1 each by Other route 4 (four) times daily. 400 each 1  . insulin NPH-regular Human (NOVOLIN 70/30) (70-30) 100 UNIT/ML injection Inject 55 Units into the skin daily with breakfast. And syringes 1/day. 60 mL 11  . losartan (COZAAR) 50 MG tablet Take 1 tablet (50 mg total) by mouth daily. 90 tablet 0  . meloxicam (MOBIC) 15 MG tablet Take 1 tablet (15 mg total) by mouth daily. 90 tablet 0  . metoprolol succinate (TOPROL-XL) 25 MG 24 hr tablet TAKE 1 TABLET EVERY DAY 90 tablet 0  . naproxen sodium (ANAPROX) 220 MG tablet Take 220 mg by mouth as needed (for pain).     . Omega-3 Fatty Acids (FISH OIL PO) Take 2,400 mg by mouth daily.     Marland Kitchen omeprazole (PRILOSEC) 20 MG capsule Take 1 capsule (20 mg total) by mouth daily. 90 capsule 0  . ondansetron (ZOFRAN) 4 MG tablet Take 1 tablet (4  mg total) by mouth every 8 (eight) hours as needed. 20 tablet 0  . triamcinolone cream (KENALOG) 0.1 % Apply 1 application topically 4 (four) times daily. As needed for itching 80 g 2  . TRUEPLUS LANCETS 33G MISC TEST BLOOD SUGAR TWICE DAILY 200 each 2   No current facility-administered medications for this visit.    No Known Allergies  Family History  Problem Relation Age of Onset  . Arthritis Mother   . Cancer Mother        Ovarian  . Diabetes Mother   . Heart disease Father   . Hypertension Father     Social History   Socioeconomic History  . Marital status: Married    Spouse name: Not on file  . Number of children: Not on file  . Years of education: Not on file  . Highest education  level: Not on file  Occupational History  . Not on file  Tobacco Use  . Smoking status: Never Smoker  . Smokeless tobacco: Never Used  Substance and Sexual Activity  . Alcohol use: No  . Drug use: No  . Sexual activity: Not Currently  Other Topics Concern  . Not on file  Social History Narrative  . Not on file   Social Determinants of Health   Financial Resource Strain: Not on file  Food Insecurity: Not on file  Transportation Needs: Not on file  Physical Activity: Not on file  Stress: Not on file  Social Connections: Not on file  Intimate Partner Violence: Not on file    Hospitiliaztions: None  Health Maintenance:    Flu: 12/2018  Tetanus: unsure  Pneumovax: never  Prevnar: 12/2018  Shingrix: never  Covid: Moderna  Mammogram: 03/2019  Pap Smear: no longer screening  Bone Density: 03/2019  Colon Screening: 04/2015  Eye Doctor: every 1-2 years  Dental Exam: biannually   Providers:   PCP: Nicki Reaper, NP-C  Endocrinologist: Dr. Everardo All   I have personally reviewed and have noted:  1. The patient's medical and social history 2. Their use of alcohol, tobacco or illicit drugs 3. Their current medications and supplements 4. The patient's functional ability including ADL's, fall risks, home safety risks and hearing or visual impairment. 5. Diet and physical activities 6. Evidence for depression or mood disorder  Subjective:   Review of Systems:   Constitutional: Pt reports weight gain. Denies fever, malaise, fatigue, headache.  HEENT: Denies eye pain, eye redness, ear pain, ringing in the ears, wax buildup, runny nose, nasal congestion, bloody nose, or sore throat. Respiratory: Denies difficulty breathing, shortness of breath, cough or sputum production.   Cardiovascular: Denies chest pain, chest tightness, palpitations or swelling in the hands or feet.  Gastrointestinal: Denies abdominal pain, bloating, constipation, diarrhea or blood in the stool.  GU: Pt  reports urge incontinence (wears pads). Denies frequency, pain with urination, burning sensation, blood in urine, odor or discharge. Musculoskeletal: Pt reports chronic joint and muscle pain. Denies decrease in range of motion, or joint swelling.  Skin: Denies redness, rashes, lesions or ulcercations.  Neurological: Denies dizziness, difficulty with memory, difficulty with speech or problems with balance and coordination.  Psych: Denies anxiety, depression, SI/HI.  No other specific complaints in a complete review of systems (except as listed in HPI above).  Objective:  PE:   BP (!) 144/78   Pulse 74   Temp 98 F (36.7 C) (Temporal)   Ht 5\' 7"  (1.702 m)   Wt (!) 306 lb (138.8 kg)  SpO2 97%   BMI 47.93 kg/m   Wt Readings from Last 3 Encounters:  01/26/20 (!) 305 lb (138.3 kg)  10/07/19 291 lb (132 kg)  08/09/19 (!) 294 lb (133.4 kg)    General: Appears her stated age, obese in NAD. Skin: Warm, dry and intact. No ulcerations noted. PVD changes noted bilaterally. HEENT: Head: normal shape and size; Eyes: sclera white, no icterus, conjunctiva pink, PERRLA and EOMs intact;  Neck: Neck supple, trachea midline. No masses, lumps or present.  Cardiovascular: Normal rate and rhythm. S1,S2 noted.  No murmur, rubs or gallops noted. Trace BLE edema. No carotid bruits noted. Pulmonary/Chest: Normal effort and positive vesicular breath sounds. No respiratory distress. No wheezes, rales or ronchi noted.  Abdomen: Soft and nontender. Normal bowel sounds. No distention or masses noted. Musculoskeletal: Gait slow and steady with use of cane. Neurological: Alert and oriented. Cranial nerves II-XII grossly intact. Coordination normal.  Psychiatric: Mood and affect normal. Behavior is normal. Judgment and thought content normal.     BMET    Component Value Date/Time   NA 138 08/26/2019 1102   K 4.0 08/26/2019 1102   CL 101 08/26/2019 1102   CO2 30 08/26/2019 1102   GLUCOSE 208 (H)  08/26/2019 1102   BUN 15 08/26/2019 1102   CREATININE 1.11 08/26/2019 1102   CALCIUM 9.6 08/26/2019 1102   GFRNONAA 55 (L) 10/20/2014 1039   GFRAA >60 10/20/2014 1039    Lipid Panel     Component Value Date/Time   CHOL 128 06/29/2018 1231   TRIG 132.0 06/29/2018 1231   HDL 42.70 06/29/2018 1231   CHOLHDL 3 06/29/2018 1231   VLDL 26.4 06/29/2018 1231   LDLCALC 59 06/29/2018 1231    CBC    Component Value Date/Time   WBC 7.3 06/29/2018 1231   RBC 4.63 06/29/2018 1231   HGB 12.2 06/29/2018 1231   HCT 38.0 06/29/2018 1231   PLT 279.0 06/29/2018 1231   MCV 82.0 06/29/2018 1231   MCH 25.6 (L) 10/20/2014 1039   MCHC 32.1 06/29/2018 1231   RDW 14.9 06/29/2018 1231   LYMPHSABS 2.2 09/06/2014 1048   MONOABS 0.5 09/06/2014 1048   EOSABS 0.3 09/06/2014 1048   BASOSABS 0.0 09/06/2014 1048    Hgb A1C Lab Results  Component Value Date   HGBA1C 7.7 (A) 01/26/2020      Assessment and Plan:   Medicare Annual Wellness Visit:  Diet: She does eat meat. She consumes fruits and veggies daily. She tries to avoid fried foods. She drinks mostly tea, coffee, pepsi. Physical activity: Sedentary Depression/mood screen: Negative, PHQ 9 score of 0 Hearing: Intact to whispered voice Visual acuity: Grossly normal, performs annual eye exam  ADLs: Capable Fall risk: Moderate, walks with cane. Home safety: Good Cognitive evaluation: Intact to orientation, naming, recall and repetition EOL planning: No adv directives, full code/ I agree  Preventative Medicine: She declines flu, tetanus and pneumovax today. Covid UTD. Encouraged her to get a Shingrix vaccine. She no longer wants to screen for cervical cancer. Mammogram, bone density and colon screening UTD. Encouraged her to consume a balanced diet and exercise regimen. Advised her to see an eye doctor and dentist annually. Will check CBC, CMET, Lipid profile today.   Next appointment: 6 months, follow up chronic conditions.   Nicki Reaper, NP This visit occurred during the SARS-CoV-2 public health emergency.  Safety protocols were in place, including screening questions prior to the visit, additional usage of staff PPE, and extensive cleaning  of exam room while observing appropriate contact time as indicated for disinfecting solutions.

## 2020-01-28 ENCOUNTER — Encounter: Payer: Self-pay | Admitting: Internal Medicine

## 2020-01-28 LAB — COMPREHENSIVE METABOLIC PANEL
ALT: 10 U/L (ref 0–35)
AST: 14 U/L (ref 0–37)
Albumin: 4.3 g/dL (ref 3.5–5.2)
Alkaline Phosphatase: 118 U/L — ABNORMAL HIGH (ref 39–117)
BUN: 15 mg/dL (ref 6–23)
CO2: 30 mEq/L (ref 19–32)
Calcium: 9.4 mg/dL (ref 8.4–10.5)
Chloride: 104 mEq/L (ref 96–112)
Creatinine, Ser: 1.21 mg/dL — ABNORMAL HIGH (ref 0.40–1.20)
GFR: 43.41 mL/min — ABNORMAL LOW (ref >60.00)
Glucose, Bld: 70 mg/dL (ref 70–99)
Potassium: 4 mEq/L (ref 3.5–5.1)
Sodium: 142 mEq/L (ref 135–145)
Total Bilirubin: 0.5 mg/dL (ref 0.2–1.2)
Total Protein: 7.9 g/dL (ref 6.0–8.3)

## 2020-01-28 LAB — LIPID PANEL
Cholesterol: 126 mg/dL (ref 0–200)
HDL: 49.9 mg/dL (ref >39.00)
LDL Cholesterol: 52 mg/dL (ref 0–99)
NonHDL: 75.76
Total CHOL/HDL Ratio: 3
Triglycerides: 118 mg/dL (ref 0.0–149.0)
VLDL: 23.6 mg/dL (ref 0.0–40.0)

## 2020-01-28 LAB — CBC
HCT: 37.7 % (ref 36.0–46.0)
Hemoglobin: 12.2 g/dL (ref 12.0–15.0)
MCHC: 32.3 g/dL (ref 30.0–36.0)
MCV: 82.5 fl (ref 78.0–100.0)
Platelets: 270 10*3/uL (ref 150.0–400.0)
RBC: 4.57 Mil/uL (ref 3.87–5.11)
RDW: 14.7 % (ref 11.5–15.5)
WBC: 8.6 10*3/uL (ref 4.0–10.5)

## 2020-01-28 NOTE — Assessment & Plan Note (Signed)
CMET and Lipid profile today Encouraged her to consume a low fat diet Continue Atorvastatin and Fish Oil 

## 2020-01-28 NOTE — Patient Instructions (Signed)
Health Maintenance After Age 77 After age 77, you are at a higher risk for certain long-term diseases and infections as well as injuries from falls. Falls are a major cause of broken bones and head injuries in people who are older than age 77. Getting regular preventive care can help to keep you healthy and well. Preventive care includes getting regular testing and making lifestyle changes as recommended by your health care provider. Talk with your health care provider about:  Which screenings and tests you should have. A screening is a test that checks for a disease when you have no symptoms.  A diet and exercise plan that is right for you. What should I know about screenings and tests to prevent falls? Screening and testing are the best ways to find a health problem early. Early diagnosis and treatment give you the best chance of managing medical conditions that are common after age 77. Certain conditions and lifestyle choices may make you more likely to have a fall. Your health care provider may recommend:  Regular vision checks. Poor vision and conditions such as cataracts can make you more likely to have a fall. If you wear glasses, make sure to get your prescription updated if your vision changes.  Medicine review. Work with your health care provider to regularly review all of the medicines you are taking, including over-the-counter medicines. Ask your health care provider about any side effects that may make you more likely to have a fall. Tell your health care provider if any medicines that you take make you feel dizzy or sleepy.  Osteoporosis screening. Osteoporosis is a condition that causes the bones to get weaker. This can make the bones weak and cause them to break more easily.  Blood pressure screening. Blood pressure changes and medicines to control blood pressure can make you feel dizzy.  Strength and balance checks. Your health care provider may recommend certain tests to check your  strength and balance while standing, walking, or changing positions.  Foot health exam. Foot pain and numbness, as well as not wearing proper footwear, can make you more likely to have a fall.  Depression screening. You may be more likely to have a fall if you have a fear of falling, feel emotionally low, or feel unable to do activities that you used to do.  Alcohol use screening. Using too much alcohol can affect your balance and may make you more likely to have a fall. What actions can I take to lower my risk of falls? General instructions  Talk with your health care provider about your risks for falling. Tell your health care provider if: ? You fall. Be sure to tell your health care provider about all falls, even ones that seem minor. ? You feel dizzy, sleepy, or off-balance.  Take over-the-counter and prescription medicines only as told by your health care provider. These include any supplements.  Eat a healthy diet and maintain a healthy weight. A healthy diet includes low-fat dairy products, low-fat (lean) meats, and fiber from whole grains, beans, and lots of fruits and vegetables. Home safety  Remove any tripping hazards, such as rugs, cords, and clutter.  Install safety equipment such as grab bars in bathrooms and safety rails on stairs.  Keep rooms and walkways well-lit. Activity  Follow a regular exercise program to stay fit. This will help you maintain your balance. Ask your health care provider what types of exercise are appropriate for you.  If you need a cane or walker,   use it as recommended by your health care provider.  Wear supportive shoes that have nonskid soles.   Lifestyle  Do not drink alcohol if your health care provider tells you not to drink.  If you drink alcohol, limit how much you have: ? 0-1 drink a day for women. ? 0-2 drinks a day for men.  Be aware of how much alcohol is in your drink. In the [place] one drink equals one typical bottle of beer (12  oz), one-half glass of wine (5 oz), or one shot of hard liquor (1 oz).  Do not use any products that contain nicotine or tobacco, such as cigarettes and e-cigarettes. If you need help quitting, ask your health care provider. Summary  Having a healthy lifestyle and getting preventive care can help to protect your health and wellness after age 77.  Screening and testing are the best way to find a health problem early and help you avoid having a fall. Early diagnosis and treatment give you the best chance for managing medical conditions that are more common for people who are older than age 77.  Falls are a major cause of broken bones and head injuries in people who are older than age 77. Take precautions to prevent a fall at home.  Work with your health care provider to learn what changes you can make to improve your health and wellness and to prevent falls. This information is not intended to replace advice given to you by your health care provider. Make sure you discuss any questions you have with your health care provider. Document Revised: 04/23/2018 Document Reviewed: 11/13/2016 Elsevier Patient Education  2021 [employer]  

## 2020-01-28 NOTE — Assessment & Plan Note (Signed)
Continue Omeprazole CBC and CMET today 

## 2020-01-28 NOTE — Assessment & Plan Note (Signed)
Compensated Continue Amlodipine, Losartan, Metoprolol and Furosemide Reinforced DASH diet  Monitor weights

## 2020-01-28 NOTE — Assessment & Plan Note (Signed)
Recent A1C reviewed No urine microalbumin secondary to ARB therapy Encouraged her to consume a low carb diet, weight loss Referral to podiatry, encouraged routine foot exams Encouraged routine eye exams Prevnar and covid UTD, she declines flu and pneumovax She will continue current meds per endocrinology

## 2020-01-28 NOTE — Assessment & Plan Note (Signed)
Controlled on Metoprolol, Amlodipine, Losartan and Furosemide CMET today Reinforced DASH diet Will monitor

## 2020-01-28 NOTE — Assessment & Plan Note (Signed)
Hx of decreased kidney function, avoid NSAID's as much as possible Consider Tylenol Arthritis Continue Bengay Encouraged weight loss

## 2020-01-28 NOTE — Assessment & Plan Note (Signed)
Hx of decreased kidney function, avoid NSAID's as much as possible Consider Tylenol Arthritis Continue Bengay Encouraged weight loss 

## 2020-02-08 ENCOUNTER — Telehealth: Payer: Self-pay | Admitting: *Deleted

## 2020-02-08 NOTE — Telephone Encounter (Signed)
Pt called stating she had a missed call from [place]. I don't see a miss call but pt said that when she was here for her CPE earlier this month she dropped off a form that just needed the provider's signature on it. Pt said she still hasn't gotten that form back and wanted to see if that's why we were calling, if not pt needs to know the status of the form.  CB [phone]

## 2020-02-09 NOTE — Addendum Note (Signed)
Addended by: Roena Malady on: 02/09/2020 05:03 PM   Modules accepted: Orders

## 2020-02-10 ENCOUNTER — Other Ambulatory Visit: Payer: Self-pay | Admitting: Internal Medicine

## 2020-02-10 DIAGNOSIS — N1831 Chronic kidney disease, stage 3a: Secondary | ICD-10-CM

## 2020-02-10 NOTE — Progress Notes (Signed)
Referral placed.

## 2020-02-11 ENCOUNTER — Ambulatory Visit: Payer: Medicare HMO | Admitting: Podiatry

## 2020-02-11 ENCOUNTER — Encounter: Payer: Self-pay | Admitting: Podiatry

## 2020-02-11 ENCOUNTER — Ambulatory Visit (INDEPENDENT_AMBULATORY_CARE_PROVIDER_SITE_OTHER): Payer: Medicare HMO

## 2020-02-11 ENCOUNTER — Other Ambulatory Visit: Payer: Self-pay

## 2020-02-11 VITALS — BP 161/87 | HR 82 | Temp 95.6°F | Resp 20

## 2020-02-11 DIAGNOSIS — M659 Synovitis and tenosynovitis, unspecified: Secondary | ICD-10-CM | POA: Diagnosis not present

## 2020-02-11 DIAGNOSIS — M19071 Primary osteoarthritis, right ankle and foot: Secondary | ICD-10-CM | POA: Diagnosis not present

## 2020-02-11 DIAGNOSIS — M19072 Primary osteoarthritis, left ankle and foot: Secondary | ICD-10-CM | POA: Diagnosis not present

## 2020-02-11 DIAGNOSIS — R609 Edema, unspecified: Secondary | ICD-10-CM

## 2020-02-11 DIAGNOSIS — E0843 Diabetes mellitus due to underlying condition with diabetic autonomic (poly)neuropathy: Secondary | ICD-10-CM

## 2020-02-13 DIAGNOSIS — E0843 Diabetes mellitus due to underlying condition with diabetic autonomic (poly)neuropathy: Secondary | ICD-10-CM | POA: Diagnosis not present

## 2020-02-13 DIAGNOSIS — M19071 Primary osteoarthritis, right ankle and foot: Secondary | ICD-10-CM | POA: Diagnosis not present

## 2020-02-13 DIAGNOSIS — M659 Synovitis and tenosynovitis, unspecified: Secondary | ICD-10-CM | POA: Diagnosis not present

## 2020-02-13 DIAGNOSIS — M19072 Primary osteoarthritis, left ankle and foot: Secondary | ICD-10-CM | POA: Diagnosis not present

## 2020-02-13 MED ORDER — BETAMETHASONE SOD PHOS & ACET 6 (3-3) MG/ML IJ SUSP
3.0000 mg | Freq: Once | INTRAMUSCULAR | Status: AC
  Administered 2020-02-13: 3 mg via INTRA_ARTICULAR

## 2020-02-13 NOTE — Progress Notes (Signed)
   Subjective:  75 y.o. female presenting today with a new complaint regarding specifically bilateral ankle pain.  The patient has had a longstanding history of ankle pain however she has had an acute flareup over the last few months.  She experiences intermittent throbbing sensation.  Aggravated by long periods of walking.  She denies a history of injury.  She presents for further treatment and evaluation   Past Medical History:  Diagnosis Date  . Arthritis   . Diabetes mellitus without complication (HCC)    Type II  . Fibromyalgia   . GERD (gastroesophageal reflux disease)    better after gall bladder surgery-   . Hyperlipidemia   . Hypertension   . Shortness of breath dyspnea    with exertion     Objective / Physical Exam:  General:  The patient is alert and oriented x3 in no acute distress. Dermatology:  Skin is warm, dry and supple bilateral lower extremities. Negative for open lesions or macerations. Vascular:  Palpable pedal pulses bilaterally. No edema or erythema noted. Capillary refill within normal limits. Neurological:  Epicritic and protective threshold grossly intact bilaterally.  Musculoskeletal Exam:  Pain on palpation to the anterior lateral medial aspects of the patient's bilateral ankles. Mild edema noted. Range of motion within normal limits to all pedal and ankle joints bilateral. Muscle strength 5/5 in all groups bilateral.   Radiographic Exam:  Normal osseous mineralization. Joint spaces preserved. No fracture/dislocation/boney destruction.    Assessment: 1.  DJD/synovitis bilateral ankle joints  Plan of Care:  1. Patient was evaluated. X-Rays reviewed.  2. Injection of 0.5 mL Celestone Soluspan injected in the patient's bilateral ankle joints.  3.  Recommend good supportive shoes to help support the foot and ankle Four.  Recommend OTC Tylenol as needed Five.  Return to clinic as needed   Bethany [name], DPM [facility]  Dr. Felecia [name], DPM    [address] [place], [place] [address]                Office [phone]  Fax [phone]

## 2020-02-29 DIAGNOSIS — E119 Type 2 diabetes mellitus without complications: Secondary | ICD-10-CM | POA: Diagnosis not present

## 2020-02-29 DIAGNOSIS — Z9841 Cataract extraction status, right eye: Secondary | ICD-10-CM | POA: Diagnosis not present

## 2020-02-29 DIAGNOSIS — Z9842 Cataract extraction status, left eye: Secondary | ICD-10-CM | POA: Diagnosis not present

## 2020-02-29 DIAGNOSIS — H5203 Hypermetropia, bilateral: Secondary | ICD-10-CM | POA: Diagnosis not present

## 2020-02-29 LAB — HM DIABETES EYE EXAM

## 2020-03-01 ENCOUNTER — Encounter: Payer: Self-pay | Admitting: Internal Medicine

## 2020-03-01 DIAGNOSIS — M150 Primary generalized (osteo)arthritis: Secondary | ICD-10-CM | POA: Diagnosis not present

## 2020-03-01 DIAGNOSIS — I10 Essential (primary) hypertension: Secondary | ICD-10-CM | POA: Diagnosis not present

## 2020-03-01 DIAGNOSIS — E119 Type 2 diabetes mellitus without complications: Secondary | ICD-10-CM | POA: Diagnosis not present

## 2020-03-01 DIAGNOSIS — N1831 Chronic kidney disease, stage 3a: Secondary | ICD-10-CM | POA: Diagnosis not present

## 2020-03-01 DIAGNOSIS — E1122 Type 2 diabetes mellitus with diabetic chronic kidney disease: Secondary | ICD-10-CM | POA: Diagnosis not present

## 2020-03-01 DIAGNOSIS — R8290 Unspecified abnormal findings in urine: Secondary | ICD-10-CM | POA: Diagnosis not present

## 2020-03-01 DIAGNOSIS — R609 Edema, unspecified: Secondary | ICD-10-CM | POA: Diagnosis not present

## 2020-03-01 DIAGNOSIS — R809 Proteinuria, unspecified: Secondary | ICD-10-CM | POA: Diagnosis not present

## 2020-03-01 DIAGNOSIS — E785 Hyperlipidemia, unspecified: Secondary | ICD-10-CM | POA: Diagnosis not present

## 2020-03-03 ENCOUNTER — Other Ambulatory Visit (HOSPITAL_COMMUNITY): Payer: Self-pay | Admitting: Nephrology

## 2020-03-03 ENCOUNTER — Other Ambulatory Visit: Payer: Self-pay | Admitting: Nephrology

## 2020-03-03 DIAGNOSIS — N1831 Chronic kidney disease, stage 3a: Secondary | ICD-10-CM

## 2020-03-03 DIAGNOSIS — R809 Proteinuria, unspecified: Secondary | ICD-10-CM

## 2020-03-03 DIAGNOSIS — E1122 Type 2 diabetes mellitus with diabetic chronic kidney disease: Secondary | ICD-10-CM

## 2020-03-03 DIAGNOSIS — E785 Hyperlipidemia, unspecified: Secondary | ICD-10-CM

## 2020-03-03 DIAGNOSIS — R609 Edema, unspecified: Secondary | ICD-10-CM

## 2020-03-03 DIAGNOSIS — R8290 Unspecified abnormal findings in urine: Secondary | ICD-10-CM

## 2020-03-08 ENCOUNTER — Other Ambulatory Visit: Payer: Self-pay

## 2020-03-08 ENCOUNTER — Ambulatory Visit
Admission: RE | Admit: 2020-03-08 | Discharge: 2020-03-08 | Disposition: A | Discharge status: Home / Self Care | Payer: Medicare HMO | Source: Ambulatory Visit | Attending: Nephrology | Admitting: Nephrology

## 2020-03-08 DIAGNOSIS — N1831 Chronic kidney disease, stage 3a: Secondary | ICD-10-CM | POA: Diagnosis not present

## 2020-03-08 DIAGNOSIS — E785 Hyperlipidemia, unspecified: Secondary | ICD-10-CM

## 2020-03-08 DIAGNOSIS — E1122 Type 2 diabetes mellitus with diabetic chronic kidney disease: Secondary | ICD-10-CM

## 2020-03-08 DIAGNOSIS — R609 Edema, unspecified: Secondary | ICD-10-CM

## 2020-03-08 DIAGNOSIS — R8290 Unspecified abnormal findings in urine: Secondary | ICD-10-CM | POA: Diagnosis not present

## 2020-03-08 DIAGNOSIS — R809 Proteinuria, unspecified: Secondary | ICD-10-CM | POA: Diagnosis not present

## 2020-03-08 DIAGNOSIS — N189 Chronic kidney disease, unspecified: Secondary | ICD-10-CM | POA: Diagnosis not present

## 2020-03-29 ENCOUNTER — Ambulatory Visit: Payer: Medicare HMO | Admitting: Endocrinology

## 2020-03-29 DIAGNOSIS — E785 Hyperlipidemia, unspecified: Secondary | ICD-10-CM | POA: Diagnosis not present

## 2020-03-29 DIAGNOSIS — E1122 Type 2 diabetes mellitus with diabetic chronic kidney disease: Secondary | ICD-10-CM | POA: Diagnosis not present

## 2020-03-29 DIAGNOSIS — R609 Edema, unspecified: Secondary | ICD-10-CM | POA: Diagnosis not present

## 2020-03-29 DIAGNOSIS — I509 Heart failure, unspecified: Secondary | ICD-10-CM | POA: Diagnosis not present

## 2020-03-29 DIAGNOSIS — E668 Other obesity: Secondary | ICD-10-CM | POA: Diagnosis not present

## 2020-03-29 DIAGNOSIS — I10 Essential (primary) hypertension: Secondary | ICD-10-CM | POA: Diagnosis not present

## 2020-04-17 ENCOUNTER — Ambulatory Visit: Payer: Medicare HMO | Admitting: Endocrinology

## 2020-04-17 ENCOUNTER — Other Ambulatory Visit: Payer: Self-pay

## 2020-04-17 VITALS — BP 170/70 | HR 92 | Ht 67.0 in | Wt 301.6 lb

## 2020-04-17 DIAGNOSIS — E1165 Type 2 diabetes mellitus with hyperglycemia: Secondary | ICD-10-CM | POA: Diagnosis not present

## 2020-04-17 DIAGNOSIS — E118 Type 2 diabetes mellitus with unspecified complications: Secondary | ICD-10-CM | POA: Diagnosis not present

## 2020-04-17 DIAGNOSIS — IMO0002 Reserved for concepts with insufficient information to code with codable children: Secondary | ICD-10-CM

## 2020-04-17 NOTE — Progress Notes (Signed)
Subjective:    Patient ID: Bethany [name], female    DOB: 1945-09-18, 75 y.o.   MRN: [id number]  HPI Pt returns for f/u of diabetes mellitus: DM type: Insulin-requiring type 2 Dx'ed: 1998.  Complications: PN and stage 3 CRI.  Therapy: insulin since 2009.  GDM: never.   DKA: never.   Severe hypoglycemia: once (2020). Pancreatitis: never. Other: she takes QD insulin, after poor results with multiple daily injections, but she often eats just 2 meals per day; she changed to 70/30, due to pattern of cbg's.    SDOH: she takes human insulin, due to cost.   Interval history: no cbg record, but states cbg varies from 61-260.  It is in general lowest in the middle of the night, and highest after meals.  no recent steroids.  She seldom has hypoglycemia, and these episodes are mild.   Past Medical History:  Diagnosis Date  . Arthritis   . Diabetes mellitus without complication (HCC)    Type II  . Fibromyalgia   . GERD (gastroesophageal reflux disease)    better after gall bladder surgery-   . Hyperlipidemia   . Hypertension   . Shortness of breath dyspnea    with exertion    Past Surgical History:  Procedure Laterality Date  . ABDOMINAL HYSTERECTOMY  1997  . CATARACT EXTRACTION Bilateral 2014  . CHOLECYSTECTOMY  2010  . COLONOSCOPY    . COLONOSCOPY WITH PROPOFOL N/A 04/21/2015   Procedure: COLONOSCOPY WITH PROPOFOL;  Surgeon: Wallace Cullens, MD;  Location: [facility] ENDOSCOPY;  Service: Gastroenterology;  Laterality: N/A;  . EYE SURGERY Bilateral    Cataract  . LUMBAR WOUND DEBRIDEMENT N/A 10/20/2014   Procedure: LUMBAR WOUND DEBRIDEMENT;  Surgeon: Tia Alert, MD;  Location: [facility] NEURO ORS;  Service: Neurosurgery;  Laterality: N/A;  . MAXIMUM ACCESS (MAS)POSTERIOR LUMBAR INTERBODY FUSION (PLIF) 1 LEVEL N/A 09/16/2014   Procedure: L/4-5 FOR MAXIMUM ACCESS (MAS) POSTERIOR LUMBAR INTERBODY FUSION (PLIF) 1 LEVEL;  Surgeon: Tia Alert, MD;  Location: [facility] NEURO ORS;  Service: Neurosurgery;   Laterality: N/A;  L/4-5 FOR MAXIMUM ACCESS (MAS) POSTERIOR LUMBAR INTERBODY FUSION (PLIF) 1 LEVEL    Social History   Socioeconomic History  . Marital status: Married    Spouse name: Not on file  . Number of children: Not on file  . Years of education: Not on file  . Highest education level: Not on file  Occupational History  . Not on file  Tobacco Use  . Smoking status: Never Smoker  . Smokeless tobacco: Never Used  Substance and Sexual Activity  . Alcohol use: No  . Drug use: No  . Sexual activity: Not Currently  Other Topics Concern  . Not on file  Social History Narrative  . Not on file   Social Determinants of Health   Financial Resource Strain: Not on file  Food Insecurity: Not on file  Transportation Needs: Not on file  Physical Activity: Not on file  Stress: Not on file  Social Connections: Not on file  Intimate Partner Violence: Not on file    Current Outpatient Medications on File Prior to Visit  Medication Sig Dispense Refill  . Alcohol Swabs (B-D SINGLE USE SWABS REGULAR) PADS USE TWICE DAILY 200 each 2  . amLODipine (NORVASC) 10 MG tablet TAKE 1 TABLET EVERY DAY 90 tablet 0  . aspirin 81 MG tablet Take 81 mg by mouth daily. Reported on 03/29/2015    . atorvastatin (LIPITOR) 20 MG tablet TAKE  1 TABLET EVERY DAY 90 tablet 1  . Blood Glucose Monitoring Suppl (TRUE METRIX METER) DEVI by Does not apply route.    . diclofenac Sodium (VOLTAREN) 1 % GEL Voltaren 1 % topical gel  APPLY 2 GRAMS TO THE AFFECTED AREA(S) BY TOPICAL ROUTE 4 TIMES PER DAY    . docusate sodium (COLACE) 100 MG capsule Take 100 mg by mouth daily as needed for mild constipation. Reported on 03/29/2015    . fluticasone (FLONASE) 50 MCG/ACT nasal spray Place 2 sprays into both nostrils daily. 16 g 6  . furosemide (LASIX) 20 MG tablet TAKE 1 TABLET EVERY DAY AS NEEDED 90 tablet 0  . glucose blood (TRUE METRIX BLOOD GLUCOSE TEST) test strip 1 each by Other route 4 (four) times daily. 400 each 1   . insulin NPH-regular Human (NOVOLIN 70/30) (70-30) 100 UNIT/ML injection Inject 55 Units into the skin daily with breakfast. And syringes 1/day. 60 mL 11  . losartan (COZAAR) 50 MG tablet TAKE 1 TABLET EVERY DAY 90 tablet 1  . metoprolol succinate (TOPROL-XL) 25 MG 24 hr tablet TAKE 1 TABLET EVERY DAY 90 tablet 0  . naproxen (NAPROSYN) 500 MG tablet     . naproxen sodium (ANAPROX) 220 MG tablet Take 220 mg by mouth as needed (for pain).     . Omega-3 Fatty Acids (FISH OIL PO) Take 2,400 mg by mouth daily.     Marland Kitchen omeprazole (PRILOSEC) 20 MG capsule TAKE 1 CAPSULE EVERY DAY 90 capsule 1  . ondansetron (ZOFRAN) 4 MG tablet Take 1 tablet (4 mg total) by mouth every 8 (eight) hours as needed. 20 tablet 0  . triamcinolone cream (KENALOG) 0.1 % Apply 1 application topically 4 (four) times daily. As needed for itching 80 g 2  . TRUEPLUS LANCETS 33G MISC TEST BLOOD SUGAR TWICE DAILY 200 each 2   No current facility-administered medications on file prior to visit.    No Known Allergies  Family History  Problem Relation Age of Onset  . Arthritis Mother   . Cancer Mother        Ovarian  . Diabetes Mother   . Heart disease Father   . Hypertension Father     BP (!) 170/70 (BP Location: Right Arm, Patient Position: Sitting, Cuff Size: Large)   Pulse 92   Ht 5\' 7"  (1.702 m)   Wt (!) 301 lb 9.6 oz (136.8 kg)   SpO2 96%   BMI 47.24 kg/m    Review of Systems     Objective:   Physical Exam VITAL SIGNS:  See vs page GENERAL: no distress Pulses: dorsalis pedis intact bilat.   MSK: no deformity of the feet CV: 2+ bilat leg edema Skin:  no ulcer on the feet.  normal color and temp on the feet.  Neuro: sensation is intact to touch on the feet, but decreased from normal.   Ext: there is bilateral onychomycosis of the toenails.   Lab Results  Component Value Date   CREATININE 1.21 (H) 01/27/2020   BUN 15 01/27/2020   NA 142 01/27/2020   K 4.0 01/27/2020   CL 104 01/27/2020   CO2 30  01/27/2020     A1c=7.9%    Assessment & Plan:  HTN: is noted today Insulin-requiring type 2 DM, with stage 3 CRI Hypoglycemia, due to insulin: this limits aggressiveness of glycemic control   Patient Instructions  Your blood pressure is high today.  Please see your primary care provider soon, to have it  rechecked.  Please continue the same insulin.      On this type of insulin schedule, you should eat meals on a regular schedule (especially lunch).  If a meal is missed or significantly delayed, your blood sugar could go low.    check your blood sugar twice a day.  vary the time of day when you check, between before the 3 meals, and at bedtime.  also check if you have symptoms of your blood sugar being too high or too low.  please keep a record of the readings and bring it to your next appointment here (or you can bring the meter itself).  You can write it on any piece of paper.  please call us sooner if your blood sugar goes below 70, or if you have a lot of readings over 200.   Please come back for a follow-up appointment in 3 months.

## 2020-04-17 NOTE — Patient Instructions (Addendum)
Your blood pressure is high today.  Please see your primary care provider soon, to have it rechecked.  Please continue the same insulin.      On this type of insulin schedule, you should eat meals on a regular schedule (especially lunch).  If a meal is missed or significantly delayed, your blood sugar could go low.    check your blood sugar twice a day.  vary the time of day when you check, between before the 3 meals, and at bedtime.  also check if you have symptoms of your blood sugar being too high or too low.  please keep a record of the readings and bring it to your next appointment here (or you can bring the meter itself).  You can write it on any piece of paper.  please call us sooner if your blood sugar goes below 70, or if you have a lot of readings over 200.   Please come back for a follow-up appointment in 3 months.

## 2020-05-18 ENCOUNTER — Other Ambulatory Visit: Payer: Self-pay

## 2020-05-18 ENCOUNTER — Encounter: Payer: Self-pay | Admitting: Physician Assistant

## 2020-05-18 ENCOUNTER — Ambulatory Visit (INDEPENDENT_AMBULATORY_CARE_PROVIDER_SITE_OTHER): Payer: Medicare HMO | Admitting: Physician Assistant

## 2020-05-18 DIAGNOSIS — E1165 Type 2 diabetes mellitus with hyperglycemia: Secondary | ICD-10-CM | POA: Diagnosis not present

## 2020-05-18 DIAGNOSIS — I10 Essential (primary) hypertension: Secondary | ICD-10-CM

## 2020-05-18 DIAGNOSIS — N1831 Chronic kidney disease, stage 3a: Secondary | ICD-10-CM | POA: Diagnosis not present

## 2020-05-18 DIAGNOSIS — G4710 Hypersomnia, unspecified: Secondary | ICD-10-CM

## 2020-05-18 DIAGNOSIS — Z7689 Persons encountering health services in other specified circumstances: Secondary | ICD-10-CM

## 2020-05-18 DIAGNOSIS — IMO0002 Reserved for concepts with insufficient information to code with codable children: Secondary | ICD-10-CM

## 2020-05-18 DIAGNOSIS — M1711 Unilateral primary osteoarthritis, right knee: Secondary | ICD-10-CM

## 2020-05-18 DIAGNOSIS — E118 Type 2 diabetes mellitus with unspecified complications: Secondary | ICD-10-CM

## 2020-05-18 DIAGNOSIS — E1122 Type 2 diabetes mellitus with diabetic chronic kidney disease: Secondary | ICD-10-CM | POA: Diagnosis not present

## 2020-05-18 NOTE — Progress Notes (Signed)
[facility] [address] [place], [place] [address]  Internal MEDICINE  Office Visit Note  Patient Name: Bethany [name]  [id number]  [id number]  Date of Service: 05/21/2020   Complaints/HPI Pt is here for establishment of PCP. Chief Complaint  Patient presents with  . New Patient (Initial Visit)  . Diabetes    PT see Endocrinology  . Hypertension  . Hyperlipidemia  . Gastroesophageal Reflux   HPI Pt is here to re-establish care. Previous doctor left and had seen [place] many years ago. -R knee is really painful, she has arthritis and had xrays showing bone on bone and needs new referral to someone to manage this -Has constipation off an on. Takes Colace which helps sometimes. Will try to eat lots of fruits and that help keep her regular.  -Had covid in Sept and has low energy and sits a lot. Has gained 30lbs since sept too. Does not have a high appetite. -Sleeps a lot, snores loudly and has woken herself up, but no gasping or choking. No morning headaches or reflux symptoms. Will doze off in lounger and then wake up and move to bed. Can also nodd off if not going anywhere. Patient would benefit from a sleep study due to these symptoms and rising BP and BMI. -She is retired now, but wants to be very active  -Saw nephrology and follows up with them again in Sept for CKD3 -Sees endocrinology for her diabetes management -BP recheck in office was 144/70. BP at home not checked recently , but will start now.  EPWORTH SLEEPINESS SCALE:  Scale:  (0)= no chance of dozing; (1)= slight chance of dozing; (2)= moderate chance of dozing; (3)= high chance of dozing  Chance  Situtation    Sitting and reading: 3    Watching TV: 3    Sitting Inactive in public: 0    As a passenger in car: 0      Lying down to rest: 3    Sitting and talking: 0    Sitting quielty after lunch: 3    In a car, stopped in traffic: 0   TOTAL SCORE:   12 out of 24   Current  Medication: Outpatient Encounter Medications as of 05/18/2020  Medication Sig  . Alcohol Swabs (B-D SINGLE USE SWABS REGULAR) PADS USE TWICE DAILY  . amLODipine (NORVASC) 10 MG tablet TAKE 1 TABLET EVERY DAY  . aspirin 81 MG tablet Take 81 mg by mouth daily. Reported on 03/29/2015  . atorvastatin (LIPITOR) 20 MG tablet TAKE 1 TABLET EVERY DAY  . Blood Glucose Monitoring Suppl (TRUE METRIX METER) DEVI by Does not apply route.  . diclofenac Sodium (VOLTAREN) 1 % GEL Voltaren 1 % topical gel  APPLY 2 GRAMS TO THE AFFECTED AREA(S) BY TOPICAL ROUTE 4 TIMES PER DAY  . docusate sodium (COLACE) 100 MG capsule Take 100 mg by mouth daily as needed for mild constipation. Reported on 03/29/2015  . fluticasone (FLONASE) 50 MCG/ACT nasal spray Place 2 sprays into both nostrils daily.  . furosemide (LASIX) 20 MG tablet TAKE 1 TABLET EVERY DAY AS NEEDED  . glucose blood (TRUE METRIX BLOOD GLUCOSE TEST) test strip 1 each by Other route 4 (four) times daily.  . insulin NPH-regular Human (NOVOLIN 70/30) (70-30) 100 UNIT/ML injection Inject 55 Units into the skin daily with breakfast. And syringes 1/day.  . losartan (COZAAR) 50 MG tablet TAKE 1 TABLET EVERY DAY  . metoprolol succinate (TOPROL-XL) 25 MG 24 hr tablet TAKE  1 TABLET EVERY DAY  . naproxen (NAPROSYN) 500 MG tablet   . naproxen sodium (ANAPROX) 220 MG tablet Take 220 mg by mouth as needed (for pain).   . Omega-3 Fatty Acids (FISH OIL PO) Take 2,400 mg by mouth daily.   Marland Kitchen omeprazole (PRILOSEC) 20 MG capsule TAKE 1 CAPSULE EVERY DAY  . ondansetron (ZOFRAN) 4 MG tablet Take 1 tablet (4 mg total) by mouth every 8 (eight) hours as needed.  . triamcinolone cream (KENALOG) 0.1 % Apply 1 application topically 4 (four) times daily. As needed for itching  . TRUEPLUS LANCETS 33G MISC TEST BLOOD SUGAR TWICE DAILY   No facility-administered encounter medications on file as of 05/18/2020.    Surgical History: Past Surgical History:  Procedure Laterality Date  .  ABDOMINAL HYSTERECTOMY  1997  . CATARACT EXTRACTION Bilateral 2014  . CHOLECYSTECTOMY  2010  . COLONOSCOPY    . COLONOSCOPY WITH PROPOFOL N/A 04/21/2015   Procedure: COLONOSCOPY WITH PROPOFOL;  Surgeon: Wallace Cullens, MD;  Location: [facility] ENDOSCOPY;  Service: Gastroenterology;  Laterality: N/A;  . EYE SURGERY Bilateral    Cataract  . LUMBAR WOUND DEBRIDEMENT N/A 10/20/2014   Procedure: LUMBAR WOUND DEBRIDEMENT;  Surgeon: Tia Alert, MD;  Location: [facility] NEUR[facility] ORS;  Service: Neurosurgery;  Laterality: N/A;  . MAXIMUM ACCESS (MAS)POSTERIOR LUMBAR INTERBODY FUSION (PLIF) 1 LEVEL N/A 09/16/2014   Procedure: L/4-5 FOR MAXIMUM ACCESS (MAS) POSTERIOR LUMBAR INTERBODY FUSION (PLIF) 1 LEVEL;  Surgeon: Tia Alert, MD;  Location: [facility] NEUR[facility] ORS;  Service: Neurosurgery;  Laterality: N/A;  L/4-5 FOR MAXIMUM ACCESS (MAS) POSTERIOR LUMBAR INTERBODY FUSION (PLIF) 1 LEVEL    Medical History: Past Medical History:  Diagnosis Date  . Arthritis   . Diabetes mellitus without complication (HCC)    Type II  . Fibromyalgia   . GERD (gastroesophageal reflux disease)    better after gall bladder surgery-   . Hyperlipidemia   . Hypertension   . Shortness of breath dyspnea    with exertion    Family History: Family History  Problem Relation Age of Onset  . Arthritis Mother   . Cancer Mother        Ovarian  . Diabetes Mother   . Heart disease Father   . Hypertension Father     Social History   Socioeconomic History  . Marital status: Married    Spouse name: Not on file  . Number of children: Not on file  . Years of education: Not on file  . Highest education level: Not on file  Occupational History  . Not on file  Tobacco Use  . Smoking status: Never Smoker  . Smokeless tobacco: Never Used  Substance and Sexual Activity  . Alcohol use: No  . Drug use: No  . Sexual activity: Not Currently  Other Topics Concern  . Not on file  Social History Narrative  . Not on file   Social Determinants of  Health   Financial Resource Strain: Not on file  Food Insecurity: Not on file  Transportation Needs: Not on file  Physical Activity: Not on file  Stress: Not on file  Social Connections: Not on file  Intimate Partner Violence: Not on file     Review of Systems  Constitutional: Positive for fatigue and unexpected weight change. Negative for chills.  HENT: Negative for congestion, postnasal drip, rhinorrhea, sneezing and sore throat.   Eyes: Negative for redness.  Respiratory: Negative for cough, chest tightness and shortness of breath.  Cardiovascular: Negative for chest pain and palpitations.  Gastrointestinal: Positive for constipation. Negative for abdominal pain, diarrhea, nausea and vomiting.  Genitourinary: Negative for dysuria and frequency.  Musculoskeletal: Positive for arthralgias. Negative for back pain, joint swelling and neck pain.  Skin: Negative for rash.  Neurological: Positive for weakness. Negative for tremors and numbness.  Hematological: Negative for adenopathy. Does not bruise/bleed easily.  Psychiatric/Behavioral: Positive for sleep disturbance. Negative for behavioral problems (Depression) and suicidal ideas. The patient is not nervous/anxious.     Vital Signs: BP (!) 157/75   Pulse 78   Temp 97.8 F (36.6 C)   Resp 16   Ht 5' 7.5" (1.715 m)   Wt (!) 303 lb (137.4 kg)   SpO2 98%   BMI 46.76 kg/m    Physical Exam Vitals and nursing note reviewed.  Constitutional:      General: She is not in acute distress.    Appearance: She is well-developed. She is obese. She is not diaphoretic.  HENT:     Head: Normocephalic and atraumatic.     Mouth/Throat:     Pharynx: No oropharyngeal exudate.  Eyes:     Pupils: Pupils are equal, round, and reactive to light.  Neck:     Thyroid: No thyromegaly.     Vascular: No JVD.     Trachea: No tracheal deviation.  Cardiovascular:     Rate and Rhythm: Normal rate and regular rhythm.     Heart sounds: Normal  heart sounds. No murmur heard. No friction rub. No gallop.   Pulmonary:     Effort: Pulmonary effort is normal. No respiratory distress.     Breath sounds: No wheezing or rales.  Chest:     Chest wall: No tenderness.  Abdominal:     General: Bowel sounds are normal.     Palpations: Abdomen is soft.  Musculoskeletal:        General: Normal range of motion.     Cervical back: Normal range of motion and neck supple.     Comments: Pain worse in R knee with weight bearing and movement  Lymphadenopathy:     Cervical: No cervical adenopathy.  Skin:    General: Skin is warm and dry.  Neurological:     Mental Status: She is alert and oriented to person, place, and time.     Cranial Nerves: No cranial nerve deficit.     Gait: Gait abnormal.     Comments: Walks with a cane  Psychiatric:        Behavior: Behavior normal.        Thought Content: Thought content normal.        Judgment: Judgment normal.       Assessment/Plan: 1. Encounter to establish care Patient has had recent lab work, so will hold on further labs at this time   2. Essential hypertension Continue current medications and monitor at home.  Patient will bring log of blood pressures next visit.  3. Osteoarthritis of right knee, unspecified osteoarthritis type Patient requesting referral to orthopedics for further evaluation and management of known osteoarthritis of right knee - AMB referral to orthopedics  4. Diabetes mellitus type 2, uncontrolled, with complications (HCC) Followed by endocrinology  5. Hypersomnia Based on snoring, daytime sleepiness, high blood pressure, and increasing BMI, patient would benefit from sleep study for further evaluation - PSG SLEEP STUDY   General Counseling: Medrith verbalizes understanding of the findings of todays visit and agrees with plan of treatment. I have discussed  any further diagnostic evaluation that may be needed or ordered today. We also reviewed her medications today.  she has been encouraged to call the office with any questions or concerns that should arise related to todays visit.    Counseling: Obesity Counseling: Risk Assessment: An assessment of behavioral risk factors was made today and includes lack of exercise sedentary lifestyle, lack of portion control and poor dietary habits.  Risk Modification Advice: will be added on next visit    Orders Placed This Encounter  Procedures  . AMB referral to orthopedics  . PSG SLEEP STUDY    No orders of the defined types were placed in this encounter.    This patient was seen by Lynn ItoLauren Angelicia Lessner, PA-C in collaboration with Dr. Beverely RisenFozia Khan as a part of collaborative care agreement.   Time spent:40 Minutes

## 2020-06-06 DIAGNOSIS — M1711 Unilateral primary osteoarthritis, right knee: Secondary | ICD-10-CM | POA: Diagnosis not present

## 2020-06-13 ENCOUNTER — Telehealth: Payer: Self-pay

## 2020-06-14 ENCOUNTER — Encounter (INDEPENDENT_AMBULATORY_CARE_PROVIDER_SITE_OTHER): Payer: Medicare HMO | Admitting: Internal Medicine

## 2020-06-14 DIAGNOSIS — G4733 Obstructive sleep apnea (adult) (pediatric): Secondary | ICD-10-CM

## 2020-06-20 NOTE — Telephone Encounter (Signed)
FG stated: We have this patietn scheduled on Wednesday, June 14, 2020 for a PSG study.

## 2020-06-23 ENCOUNTER — Encounter: Payer: Self-pay | Admitting: Physician Assistant

## 2020-06-27 DIAGNOSIS — G4733 Obstructive sleep apnea (adult) (pediatric): Secondary | ICD-10-CM | POA: Insufficient documentation

## 2020-06-27 NOTE — Procedures (Signed)
[facility]  Polysomnogram Report Part I                                                                 Phone: [phone] Fax: [phone]  Patient Name: Bethany [name], Bethany [name] Acquisition Number: [id number]  Date of Birth: [birth year] Acquisition Date: 06/14/2020  Referring Physician: Lynn Ito, PA-C     History: The patient is a 75 year old female who was referred for evaluation of possible sleep apnea. Medical History: diabetes, hypertension, hyperlipidemia, gastroesophageal reflux, arthritis, fibromyalgia,  dyspnea.  Medications: amlodipine, aspirin, atorvastatin, diclofenac sodium, docusate sodium, fluticasone, furosemide, insulin, losartan, metoprolol succinate, naproxen, naproxen sodium, omega-3 fatty acid, omeprazole, ondansetron, triamcinolone cream.  Procedure: This routine overnight polysomnogram was performed on the [facility] 5 using the standard diagnostic protocol. This included 6 channels of EEG, 2 channels of EOG, chin EMG, bilateral anterior tibialis EMG, nasal/oral thermistor, PTAF (nasal pressure transducer), chest and abdominal wall movements, EKG, and pulse oximetry.  Description: The total recording time was 360.7 minutes. The total sleep time was 260.0 minutes. There were a total of 72.3 minutes of wakefulness after sleep onset for a reducedsleep efficiency of 72.1%. The latency to sleep onset was within normal limitsat 28.4 minutes. The R sleep onset latency was within normal limits at 116.5 minutes. Sleep parameters, as a percentage of the total sleep time, demonstrated 1.3% of sleep was in N1 sleep, 69.8% N2, 19.8% N3 and 9.0% R sleep. There were a total of 67 arousals for an arousal index of 15.5 arousals per hour of sleep that was slightly elevated.  Respiratory monitoring demonstrated nearly continuous moderate to severe degree of snoring in all positions.  Only 35 minutes of non-supine sleep were observed.There were 29 apneas and hypopneas for an Apnea  Hypopnea Index of 6.7 apneas and hypopneas per hour of sleep. The REM related apnea hypopnea index was 20.4/hr of REM sleep compared to a NREM AHI of 5.3/hr. The Respiratory Disturbance Index, which includes 25 respiratory effort related arousals (RERAs), was 12.5 respiratory events per hour of sleep.  The average duration of the respiratory events was 21.9 seconds with a maximum duration of 37.5 seconds. The respiratory events occurred exclusively in the supine position. The respiratory events were associated with peripheral oxygen desaturations on the average to 95%. The lowest oxygen desaturation associated with a respiratory event was 90%. Additionally, the baseline oxygen saturation during wakefulness was 99%, during NREM sleep averaged 97%, and during REM sleep averaged  97%. The total duration of oxygen < 90% was 0.1 minutes.  Cardiac monitoring- did not demonstrate transient cardiac decelerations associated with the apneas. There were no significant cardiac rhythm irregularities.   Periodic limb movement monitoring- did not demonstrate periodic limb movements.   Impression: This routine overnight polysomnogram demonstrated significant obstructive sleep apnea with an overall Apnea Hypopnea Index of 6.7 apneas and hypopneas per hour of sleep. The apnea occurred exclusively in the supine position with an AHI of 7.7 and in supine REM with an AHI of 20.4. As REM percentage was markedly reduced, the findings likely underestimate the severity of the sleep apnea.  There was a reduced sleep efficiency with a slightly elevated arousal indexand a reduced percentage of REM sleep. These findings would appear to be due  to the combination of obstructive sleep apnea and psychophysiological factors such as first night effect.   Recommendations:    A CPAP titration in the supine position would be recommended for the sleep apnea.  Would recommend weight loss in a patient with a BMI of 47.0. Alternative treatment  options may include: avoiding the supine sleep position, a nasal resistance device, an oral appliance or ENT surgery in the appropriate clinical context.     Yevonne Pax, MD, [facility] Diplomate ABMS-Pulmonary, Critical Care and Sleep Medicine  Electronically reviewed and digitally signed      [facility] Polysomnogram Report Part II  Phone: [phone] Fax: [phone]  Patient last name [name] Neck Size 15.0 in. Acquisition [phone]  Patient first name Bethany Weight 300.0 lbs. Started 06/14/2020 at 10:20:02 PM  Birth date November 30, 1945 Height 67.0 in. Stopped 06/15/2020 at 4:28:14 AM  Age 65 BMI 47.0 lb/in2 Duration 360.7  Study Type Adult      Report generated by: Hampton Abbot, RPSGT Sleep Data: Lights Out: 10:23:38 PM Sleep Onset: 10:52:02 PM  Lights On: 4:24:20 AM Sleep Efficiency: 72.1 %  Total Recording Time: 360.7 min Sleep Latency (from Lights Off) 28.4 min  Total Sleep Time (TST): 260.0 min R Latency (from Sleep Onset): 116.5 min  Sleep Period Time: 319.5 min Total number of awakenings: 3  Wake during sleep: 59.5 min Wake After Sleep Onset (WASO): 72.3 min   Sleep Data:         Arousal Summary: Stage  Latency from lights out (min) Latency from sleep onset (min) Duration (min) % Total Sleep Time  Normal values  N 1 28.4 0.0 3.5 1.3 (5%)  N 2 28.9 0.5 181.5 69.8 (50%)  N 3 45.4 17.0 51.5 19.8 (20%)  R 144.9 116.5 23.5 9.0 (25%)    Number Index  Spontaneous 58 13.4  Apneas & Hypopneas 8 1.8  RERAs 25 5.8       (Apneas & Hypopneas & RERAs)  (33) (7.6)  Limb Movement 0 0.0  Snore 0 0.0  TOTAL 91 21.0     Respiratory Data:  CA OA MA Apnea Hypopnea* A+ H RERA Total  Number 0 3 0 3 26 29 25  54  Mean Dur (sec) 0.0 11.8 0.0 11.8 24.8 23.5 20.1 21.9  Max Dur (sec) 0.0 13.5 0.0 13.5 37.5 37.5 32.5 37.5  Total Dur (min) 0.0 0.6 0.0 0.6 10.8 11.3 8.4 19.7  % of TST 0.0 0.2 0.0 0.2 4.1 4.4 3.2 7.6  Index (#/h TST) 0.0 0.7 0.0 0.7 6.0 6.7 5.8 12.5  *Hypopneas  scored based on 4% or greater desaturation.  Sleep Stage:        REM NREM TST  AHI 20.4 5.3 6.7  RDI 20.4 11.7 12.5           Body Position Data:  Sleep (min) TST (%) REM (min) NREM (min) CA (#) OA (#) MA (#) HYP (#) AHI (#/h) RERA (#) RDI (#/h) Desat (#)  Supine 225.0 86.54 23.5 201.5 0 3 0 26 7.7 25 14.4 27  Non-Supine 35.00 13.46 0.00 35.00 0.00 0.00 0.00 0.00 0.00 0 0.00 0.00  Left: 35.0 13.46 0.0 35.0 0 0 0 0 0.0 0 0.00 0     Snoring: Total number of snoring episodes  0  Total time with snoring    min (   % of sleep)   Oximetry Distribution:             WK REM  NREM TOTAL  Average (%)   99 97 97 98  < 90% 0.1 0.0 0.0 0.1  < 80% 0.1 0.0 0.0 0.1  < 70% 0.1 0.0 0.0 0.1  # of Desaturations* 0 8 19 27   Desat Index (#/hour) 0.0 20.4 4.8 6.2  Desat Max (%) 0 6 8 8   Desat Max Dur (sec) 0.0 57.0 36.0 57.0  Approx Min O2 during sleep 90  Approx min O2 during a respiratory event 90  Was Oxygen added (Y/N) and final rate No:   0 LPM  *Desaturations based on 4% or greater drop from baseline.   Cheyne Stokes Breathing: None Present   Heart Rate Summary:  Average Heart Rate During Sleep 70.7 bpm      Highest Heart Rate During Sleep (95th %) 75.0 bpm      Highest Heart Rate During Sleep 128 bpm      Highest Heart Rate During Recording (TIB) 184 bpm (artifact)   Heart Rate Observations: Event Type # Events   Bradycardia 0 Lowest HR Scored: N/A  Sinus Tachycardia During Sleep 0 Highest HR Scored: N/A  Narrow Complex Tachycardia 0 Highest HR Scored: N/A  Wide Complex Tachycardia 0 Highest HR Scored: N/A  Asystole 0 Longest Pause: N/A  Atrial Fibrillation 0 Duration Longest Event: N/A  Other Arrythmias  No Type:    Periodic Limb Movement Data: (Primary legs unless otherwise noted) Total # Limb Movement 0 Limb Movement Index 0.0  Total # PLMS    PLMS Index     Total # PLMS Arousals    PLMS Arousal Index     Percentage Sleep Time with PLMS   min (   % sleep)   Mean Duration limb movements (secs)

## 2020-07-20 ENCOUNTER — Ambulatory Visit (INDEPENDENT_AMBULATORY_CARE_PROVIDER_SITE_OTHER): Payer: Medicare HMO | Admitting: Internal Medicine

## 2020-07-20 ENCOUNTER — Other Ambulatory Visit: Payer: Self-pay

## 2020-07-20 ENCOUNTER — Encounter: Payer: Self-pay | Admitting: Internal Medicine

## 2020-07-20 VITALS — BP 155/80 | HR 90 | Temp 97.1°F | Resp 16 | Ht 67.0 in | Wt 303.2 lb

## 2020-07-20 DIAGNOSIS — E1151 Type 2 diabetes mellitus with diabetic peripheral angiopathy without gangrene: Secondary | ICD-10-CM

## 2020-07-20 DIAGNOSIS — I739 Peripheral vascular disease, unspecified: Secondary | ICD-10-CM

## 2020-07-20 DIAGNOSIS — E1121 Type 2 diabetes mellitus with diabetic nephropathy: Secondary | ICD-10-CM

## 2020-07-20 DIAGNOSIS — Z23 Encounter for immunization: Secondary | ICD-10-CM

## 2020-07-20 DIAGNOSIS — IMO0002 Reserved for concepts with insufficient information to code with codable children: Secondary | ICD-10-CM

## 2020-07-20 DIAGNOSIS — R609 Edema, unspecified: Secondary | ICD-10-CM | POA: Diagnosis not present

## 2020-07-20 DIAGNOSIS — E1165 Type 2 diabetes mellitus with hyperglycemia: Secondary | ICD-10-CM

## 2020-07-20 DIAGNOSIS — I10 Essential (primary) hypertension: Secondary | ICD-10-CM

## 2020-07-20 NOTE — Progress Notes (Signed)
[facility] [address] [place], [place] [address]  Internal MEDICINE  Office Visit Note  Patient Name: Bethany [name]  [id number]  [id number]  Date of Service: 07/26/2020  Chief Complaint  Patient presents with   Follow-up    Look at legs, leg swelling   Diabetes    Sees endo   Gastroesophageal Reflux   Hyperlipidemia   Hypertension   Quality Metric Gaps    shingrix    HPI Bethany [name] is here for routine follow-up 1.  Patient has type 2 insulin-dependent diabetes and is followed by endocrinology her hemoglobin A1c is not at target, patient does not watch her diet. 2.  She is complaining of bilateral leg swelling and shortness of breath, patient is also on amlodipine. 3.  Her blood pressure is not well controlled as well she takes multiple antihypertensive agents to control her blood pressure. 4.  Patient does have signs /symptoms of sleep apnea however no formal sleep study has been performed 5.  Chronic congestive heart failure might need echocardiogram  Current Medication: Outpatient Encounter Medications as of 07/20/2020  Medication Sig   Alcohol Swabs (B-D SINGLE USE SWABS REGULAR) PADS USE TWICE DAILY   amLODipine (NORVASC) 10 MG tablet TAKE 1 TABLET EVERY DAY   aspirin 81 MG tablet Take 81 mg by mouth daily. Reported on 03/29/2015   atorvastatin (LIPITOR) 20 MG tablet TAKE 1 TABLET EVERY DAY   Blood Glucose Monitoring Suppl (TRUE METRIX METER) DEVI by Does not apply route.   diclofenac Sodium (VOLTAREN) 1 % GEL Voltaren 1 % topical gel  APPLY 2 GRAMS TO THE AFFECTED AREA(S) BY TOPICAL ROUTE 4 TIMES PER DAY   docusate sodium (COLACE) 100 MG capsule Take 100 mg by mouth daily as needed for mild constipation. Reported on 03/29/2015   fluticasone (FLONASE) 50 MCG/ACT nasal spray Place 2 sprays into both nostrils daily.   furosemide (LASIX) 20 MG tablet TAKE 1 TABLET EVERY DAY AS NEEDED   glucose blood (TRUE METRIX BLOOD GLUCOSE TEST) test strip 1 each by Other  route 4 (four) times daily.   insulin NPH-regular Human (NOVOLIN 70/30) (70-30) 100 UNIT/ML injection Inject 55 Units into the skin daily with breakfast. And syringes 1/day.   losartan (COZAAR) 50 MG tablet TAKE 1 TABLET EVERY DAY   naproxen (NAPROSYN) 500 MG tablet    naproxen sodium (ANAPROX) 220 MG tablet Take 220 mg by mouth as needed (for pain).    Omega-3 Fatty Acids (FISH OIL PO) Take 2,400 mg by mouth daily.    omeprazole (PRILOSEC) 20 MG capsule TAKE 1 CAPSULE EVERY DAY   triamcinolone cream (KENALOG) 0.1 % Apply 1 application topically 4 (four) times daily. As needed for itching   TRUEPLUS LANCETS 33G MISC TEST BLOOD SUGAR TWICE DAILY   [DISCONTINUED] metoprolol succinate (TOPROL-XL) 25 MG 24 hr tablet TAKE 1 TABLET EVERY DAY   [DISCONTINUED] ondansetron (ZOFRAN) 4 MG tablet Take 1 tablet (4 mg total) by mouth every 8 (eight) hours as needed. (Patient not taking: Reported on 07/20/2020)   No facility-administered encounter medications on file as of 07/20/2020.    Surgical History: Past Surgical History:  Procedure Laterality Date   ABDOMINAL HYSTERECTOMY  1997   CATARACT EXTRACTION Bilateral 2014   CHOLECYSTECTOMY  2010   COLONOSCOPY     COLONOSCOPY WITH PROPOFOL N/A 04/21/2015   Procedure: COLONOSCOPY WITH PROPOFOL;  Surgeon: Hulen Luster, MD;  Location: [facility] ENDOSCOPY;  Service: Gastroenterology;  Laterality: N/A;   EYE SURGERY Bilateral  Cataract   LUMBAR WOUND DEBRIDEMENT N/A 10/20/2014   Procedure: LUMBAR WOUND DEBRIDEMENT;  Surgeon: Eustace Moore, MD;  Location: [facility];  Service: Neurosurgery;  Laterality: N/A;   MAXIMUM ACCESS (MAS)POSTERIOR LUMBAR INTERBODY FUSION (PLIF) 1 LEVEL N/A 09/16/2014   Procedure: L/4-5 FOR MAXIMUM ACCESS (MAS) POSTERIOR LUMBAR INTERBODY FUSION (PLIF) 1 LEVEL;  Surgeon: Eustace Moore, MD;  Location: [facility];  Service: Neurosurgery;  Laterality: N/A;  L/4-5 FOR MAXIMUM ACCESS (MAS) POSTERIOR LUMBAR INTERBODY FUSION (PLIF) 1 LEVEL    Medical  History: Past Medical History:  Diagnosis Date   Arthritis    Diabetes mellitus without complication (HCC)    Type II   Fibromyalgia    GERD (gastroesophageal reflux disease)    better after gall bladder surgery-    Hyperlipidemia    Hypertension    Shortness of breath dyspnea    with exertion    Family History: Family History  Problem Relation Age of Onset   Arthritis Mother    Cancer Mother        Ovarian   Diabetes Mother    Heart disease Father    Hypertension Father     Social History   Socioeconomic History   Marital status: Married    Spouse name: Not on file   Number of children: Not on file   Years of education: Not on file   Highest education level: Not on file  Occupational History   Not on file  Tobacco Use   Smoking status: Never   Smokeless tobacco: Never  Substance and Sexual Activity   Alcohol use: No   Drug use: No   Sexual activity: Not Currently  Other Topics Concern   Not on file  Social History Narrative   Not on file   Social Determinants of Health   Financial Resource Strain: Not on file  Food Insecurity: Not on file  Transportation Needs: Not on file  Physical Activity: Not on file  Stress: Not on file  Social Connections: Not on file  Intimate Partner Violence: Not on file      Review of Systems  Constitutional:  Negative for chills, fatigue and unexpected weight change.  HENT:  Positive for postnasal drip. Negative for congestion, rhinorrhea, sneezing and sore throat.   Eyes:  Negative for redness.  Respiratory:  Negative for cough, chest tightness and shortness of breath.   Cardiovascular:  Positive for leg swelling. Negative for chest pain and palpitations.  Gastrointestinal:  Negative for abdominal pain, constipation, diarrhea, nausea and vomiting.  Genitourinary:  Negative for dysuria and frequency.  Musculoskeletal:  Negative for arthralgias, back pain, joint swelling and neck pain.  Skin:  Negative for rash.   Neurological: Negative.  Negative for tremors and numbness.  Hematological:  Negative for adenopathy. Does not bruise/bleed easily.  Psychiatric/Behavioral:  Negative for behavioral problems (Depression), sleep disturbance and suicidal ideas. The patient is not nervous/anxious.    Vital Signs: BP (!) 155/80 Comment: 164/74  Pulse 90   Temp (!) 97.1 F (36.2 C)   Resp 16   Ht 5' 7"  (1.702 m)   Wt (!) 303 lb 3.2 oz (137.5 kg)   SpO2 97%   BMI 47.49 kg/m    Physical Exam Constitutional:      Appearance: Normal appearance.  HENT:     Head: Normocephalic and atraumatic.     Nose: Nose normal.     Mouth/Throat:     Mouth: Mucous membranes are moist.  Pharynx: No posterior oropharyngeal erythema.  Eyes:     Extraocular Movements: Extraocular movements intact.     Pupils: Pupils are equal, round, and reactive to light.  Cardiovascular:     Pulses:          Dorsalis pedis pulses are 2+ on the right side and 1+ on the left side.       Posterior tibial pulses are 3+ on the right side and 3+ on the left side.     Heart sounds: Normal heart sounds.  Pulmonary:     Effort: Pulmonary effort is normal.     Breath sounds: Normal breath sounds.  Musculoskeletal:     Right foot: Decreased range of motion.     Left foot: Decreased range of motion.  Feet:     Right foot:     Protective Sensation: 2 sites tested.  2 sites sensed.     Skin integrity: Blister present.     Toenail Condition: Right toenails are normal.     Left foot:     Protective Sensation: 2 sites tested.  2 sites sensed.     Skin integrity: Skin breakdown present.     Toenail Condition: Left toenails are normal.  Neurological:     General: No focal deficit present.     Mental Status: She is alert.  Psychiatric:        Mood and Affect: Mood normal.        Behavior: Behavior normal.       Assessment/Plan: 1. Diabetes mellitus with peripheral vascular disease ([place]) It was hard to palpate her pulses and ABIs  done which is abnormal patient will get vascular study and will be referred as indicated - POCT ABI Screening Pilot No Charge - US ARTERIAL LOWER EXTREMITY DUPLEX BILATERAL; Future  2. Edema, unspecified type Patient is instructed to increase her Lasix to 40 mg for the next few days and then as needed however patient is on amlodipine which is notorious for lower extremity edema she might need echo as well to see her LV function  3. Uncontrolled diabetes mellitus with diabetic nephropathy ([place]) Is followed by nephrology she has stable creatinine and EGFR in the last few months  4. Benign hypertension Blood pressure is not well controlled we will increase her metoprolol XL to 50 mg once a day  5. Need for shingles vaccine Prescription sent to the pharmacy for administration  General Counseling: Jackalynn verbalizes understanding of the findings of todays visit and agrees with plan of treatment. I have discussed any further diagnostic evaluation that may be needed or ordered today. We also reviewed her medications today. she has been encouraged to call the office with any questions or concerns that should arise related to todays visit.    Orders Placed This Encounter  Procedures   US ARTERIAL LOWER EXTREMITY DUPLEX BILATERAL   Varicella-zoster vaccine subcutaneous   POCT ABI Screening Pilot No Charge    No orders of the defined types were placed in this encounter.   Total time spent:30 Minutes Time spent includes review of chart, medications, test results, and follow up plan with the patient.   [place] Controlled Substance Database was reviewed by me.   Dr Lavera Guise Internal medicine

## 2020-07-24 ENCOUNTER — Other Ambulatory Visit: Payer: Self-pay

## 2020-07-24 MED ORDER — METOPROLOL SUCCINATE ER 50 MG PO TB24: 50.0000 mg | ORAL_TABLET | Freq: Every day | ORAL | 3 refills | Status: DC

## 2020-07-26 ENCOUNTER — Ambulatory Visit (INDEPENDENT_AMBULATORY_CARE_PROVIDER_SITE_OTHER): Payer: Medicare HMO | Admitting: Endocrinology

## 2020-07-26 ENCOUNTER — Other Ambulatory Visit: Payer: Self-pay

## 2020-07-26 ENCOUNTER — Encounter: Payer: Self-pay | Admitting: Internal Medicine

## 2020-07-26 VITALS — BP 144/70 | HR 90 | Ht 67.0 in | Wt 305.0 lb

## 2020-07-26 DIAGNOSIS — E1165 Type 2 diabetes mellitus with hyperglycemia: Secondary | ICD-10-CM

## 2020-07-26 DIAGNOSIS — E118 Type 2 diabetes mellitus with unspecified complications: Secondary | ICD-10-CM

## 2020-07-26 DIAGNOSIS — IMO0002 Reserved for concepts with insufficient information to code with codable children: Secondary | ICD-10-CM

## 2020-07-26 LAB — POCT GLYCOSYLATED HEMOGLOBIN (HGB A1C): Hemoglobin A1C: 8.7 % — AB (ref 4.0–5.6)

## 2020-07-26 NOTE — Progress Notes (Signed)
Subjective:    Patient ID: Bethany [name], female    DOB: 07/12/1945, 75 y.o.   MRN: [id number]  HPI Pt returns for f/u of diabetes mellitus: DM type: Insulin-requiring type 2 Dx'ed: 1998.  Complications: PN and stage 3 CRI.  Therapy: insulin since 2009.  GDM: never.   DKA: never.   Severe hypoglycemia: once (2020). Pancreatitis: never. Other: she takes QD insulin, after poor results with multiple daily injections, but she often eats just 2 meals per day; she changed to 70/30, due to pattern of cbg's.    SDOH: she takes human insulin, due to cost.   Interval history: She seldom has hypoglycemia, and these episodes are mild. She recently had a steroid injection into the knee.  This increased cbg to the 200's.  Prior to that, cbg varied from 60-247.  It is in general higher as the day goes on.   Past Medical History:  Diagnosis Date   Arthritis    Diabetes mellitus without complication (HCC)    Type II   Fibromyalgia    GERD (gastroesophageal reflux disease)    better after gall bladder surgery-    Hyperlipidemia    Hypertension    Shortness of breath dyspnea    with exertion    Past Surgical History:  Procedure Laterality Date   ABDOMINAL HYSTERECTOMY  1997   CATARACT EXTRACTION Bilateral 2014   CHOLECYSTECTOMY  2010   COLONOSCOPY     COLONOSCOPY WITH PROPOFOL N/A 04/21/2015   Procedure: COLONOSCOPY WITH PROPOFOL;  Surgeon: Wallace Cullens, MD;  Location: [facility] ENDOSCOPY;  Service: Gastroenterology;  Laterality: N/A;   EYE SURGERY Bilateral    Cataract   LUMBAR WOUND DEBRIDEMENT N/A 10/20/2014   Procedure: LUMBAR WOUND DEBRIDEMENT;  Surgeon: Tia Alert, MD;  Location: [facility] NEURO ORS;  Service: Neurosurgery;  Laterality: N/A;   MAXIMUM ACCESS (MAS)POSTERIOR LUMBAR INTERBODY FUSION (PLIF) 1 LEVEL N/A 09/16/2014   Procedure: L/4-5 FOR MAXIMUM ACCESS (MAS) POSTERIOR LUMBAR INTERBODY FUSION (PLIF) 1 LEVEL;  Surgeon: Tia Alert, MD;  Location: [facility] NEURO ORS;  Service: Neurosurgery;   Laterality: N/A;  L/4-5 FOR MAXIMUM ACCESS (MAS) POSTERIOR LUMBAR INTERBODY FUSION (PLIF) 1 LEVEL    Social History   Socioeconomic History   Marital status: Married    Spouse name: Not on file   Number of children: Not on file   Years of education: Not on file   Highest education level: Not on file  Occupational History   Not on file  Tobacco Use   Smoking status: Never   Smokeless tobacco: Never  Substance and Sexual Activity   Alcohol use: No   Drug use: No   Sexual activity: Not Currently  Other Topics Concern   Not on file  Social History Narrative   Not on file   Social Determinants of Health   Financial Resource Strain: Not on file  Food Insecurity: Not on file  Transportation Needs: Not on file  Physical Activity: Not on file  Stress: Not on file  Social Connections: Not on file  Intimate Partner Violence: Not on file    Current Outpatient Medications on File Prior to Visit  Medication Sig Dispense Refill   Alcohol Swabs (B-D SINGLE USE SWABS REGULAR) PADS USE TWICE DAILY 200 each 2   amLODipine (NORVASC) 10 MG tablet TAKE 1 TABLET EVERY DAY 90 tablet 0   aspirin 81 MG tablet Take 81 mg by mouth daily. Reported on 03/29/2015     atorvastatin (LIPITOR) 20  MG tablet TAKE 1 TABLET EVERY DAY 90 tablet 1   Blood Glucose Monitoring Suppl (TRUE METRIX METER) DEVI by Does not apply route.     diclofenac Sodium (VOLTAREN) 1 % GEL Voltaren 1 % topical gel  APPLY 2 GRAMS TO THE AFFECTED AREA(S) BY TOPICAL ROUTE 4 TIMES PER DAY     docusate sodium (COLACE) 100 MG capsule Take 100 mg by mouth daily as needed for mild constipation. Reported on 03/29/2015     fluticasone (FLONASE) 50 MCG/ACT nasal spray Place 2 sprays into both nostrils daily. 16 g 6   furosemide (LASIX) 20 MG tablet TAKE 1 TABLET EVERY DAY AS NEEDED 90 tablet 0   glucose blood (TRUE METRIX BLOOD GLUCOSE TEST) test strip 1 each by Other route 4 (four) times daily. 400 each 1   insulin NPH-regular Human  (NOVOLIN 70/30) (70-30) 100 UNIT/ML injection Inject 55 Units into the skin daily with breakfast. And syringes 1/day. 60 mL 11   losartan (COZAAR) 50 MG tablet TAKE 1 TABLET EVERY DAY 90 tablet 1   metoprolol succinate (TOPROL-XL) 50 MG 24 hr tablet Take 1 tablet (50 mg total) by mouth daily. Take with or immediately following a meal. 90 tablet 3   naproxen (NAPROSYN) 500 MG tablet      naproxen sodium (ANAPROX) 220 MG tablet Take 220 mg by mouth as needed (for pain).      Omega-3 Fatty Acids (FISH OIL PO) Take 2,400 mg by mouth daily.      omeprazole (PRILOSEC) 20 MG capsule TAKE 1 CAPSULE EVERY DAY 90 capsule 1   triamcinolone cream (KENALOG) 0.1 % Apply 1 application topically 4 (four) times daily. As needed for itching 80 g 2   TRUEPLUS LANCETS 33G MISC TEST BLOOD SUGAR TWICE DAILY 200 each 2   No current facility-administered medications on file prior to visit.    No Known Allergies  Family History  Problem Relation Age of Onset   Arthritis Mother    Cancer Mother        Ovarian   Diabetes Mother    Heart disease Father    Hypertension Father     BP (!) 144/70   Pulse 90   Ht 5\' 7"  (1.702 m)   Wt (!) 305 lb (138.3 kg)   SpO2 93%   BMI 47.77 kg/m    Review of Systems     Objective:   Physical Exam Pulses: dorsalis pedis absent bilat (poss due to edema) MSK: no deformity of the feet CV: 2+ bilat leg edema Skin:  no ulcer on the feet.  normal color and temp on the feet.  Neuro: sensation is intact to touch on the feet, but decreased from normal.   Ext: there is bilateral onychomycosis of the toenails.    A1c=8.7%    Assessment & Plan:  Insulin-requiring type 2 DM: uncontrolled, due to steroid injection. Hypoglycemia, due to insulin: this limits aggressiveness of glycemic control, so despite the above, we can't increase insulin now.    Patient Instructions  Please continue the same insulin.    On this type of insulin schedule, you should eat meals on a regular  schedule (especially lunch).  If a meal is missed or significantly delayed, your blood sugar could go low.    check your blood sugar twice a day.  vary the time of day when you check, between before the 3 meals, and at bedtime.  also check if you have symptoms of your blood sugar being too  high or too low.  please keep a record of the readings and bring it to your next appointment here (or you can bring the meter itself).  You can write it on any piece of paper.  please call us sooner if your blood sugar goes below 70, or if you have a lot of readings over 200.   Please come back for a follow-up appointment in 3 months.     Bariatric Surgery You have so much to gain by losing weight.  You may have already tried every diet and exercise plan imaginable.  And, you may have sought advice from your family physician, too.   Sometimes, in spite of such diligent efforts, you may not be able to achieve long-term results by yourself.  In cases of severe obesity, bariatric or weight loss surgery is a proven method of achieving long-term weight control.  Our Services Our bariatric surgery programs offer our patients new hope and long-term weight-loss solution.  Since introducing our services in 2003, we have conducted more than 2,400 successful procedures.  Our program is designated as a Investment banker, corporate by the Metabolic and Bariatric Surgery Accreditation and Quality Improvement Program (MBSAQIP), a Child psychotherapist that sets rigorous patient safety and outcome standards.  Our program is also designated as a Engineer, manufacturing systems by [employer].   Our exceptional weight-loss surgery team specializes in diagnosis, treatment, follow-up care, and ongoing support for our patients with severe weight loss challenges.  We currently offer laparoscopic sleeve gastrectomy, gastric bypass, and adjustable gastric band (LAP-BAND).    Attend our Bariatrics Seminar Choosing to undergo a bariatric  procedure is a big decision, and one that should not be taken lightly.  You now have two options in how you learn about weight-loss surgery - in person or online.  Our objective is to ensure you have all of the information that you need to evaluate the advantages and obligations of this life changing procedure.  Please note that you are not alone in this process, and our experienced team is ready to assist and answer all of your questions.  There are several ways to register for a seminar (either on-line or in person):  Call [phone] Go on-line to [facility] and register for either type of seminar.  [email or web address]

## 2020-07-26 NOTE — Patient Instructions (Addendum)
Please continue the same insulin.    On this type of insulin schedule, you should eat meals on a regular schedule (especially lunch).  If a meal is missed or significantly delayed, your blood sugar could go low.    check your blood sugar twice a day.  vary the time of day when you check, between before the 3 meals, and at bedtime.  also check if you have symptoms of your blood sugar being too high or too low.  please keep a record of the readings and bring it to your next appointment here (or you can bring the meter itself).  You can write it on any piece of paper.  please call us sooner if your blood sugar goes below 70, or if you have a lot of readings over 200.   Please come back for a follow-up appointment in 3 months.     Bariatric Surgery You have so much to gain by losing weight.  You may have already tried every diet and exercise plan imaginable.  And, you may have sought advice from your family physician, too.   Sometimes, in spite of such diligent efforts, you may not be able to achieve long-term results by yourself.  In cases of severe obesity, bariatric or weight loss surgery is a proven method of achieving long-term weight control.  Our Services Our bariatric surgery programs offer our patients new hope and long-term weight-loss solution.  Since introducing our services in 2003, we have conducted more than 2,400 successful procedures.  Our program is designated as a Investment banker, corporate by the Metabolic and Bariatric Surgery Accreditation and Quality Improvement Program (MBSAQIP), a Child psychotherapist that sets rigorous patient safety and outcome standards.  Our program is also designated as a Engineer, manufacturing systems by [employer].   Our exceptional weight-loss surgery team specializes in diagnosis, treatment, follow-up care, and ongoing support for our patients with severe weight loss challenges.  We currently offer laparoscopic sleeve gastrectomy, gastric bypass, and  adjustable gastric band (LAP-BAND).    Attend our Bariatrics Seminar Choosing to undergo a bariatric procedure is a big decision, and one that should not be taken lightly.  You now have two options in how you learn about weight-loss surgery - in person or online.  Our objective is to ensure you have all of the information that you need to evaluate the advantages and obligations of this life changing procedure.  Please note that you are not alone in this process, and our experienced team is ready to assist and answer all of your questions.  There are several ways to register for a seminar (either on-line or in person):  Call [phone] Go on-line to [facility] and register for either type of seminar.  [email or web address]

## 2020-07-31 ENCOUNTER — Encounter (INDEPENDENT_AMBULATORY_CARE_PROVIDER_SITE_OTHER): Payer: Medicare HMO | Admitting: Internal Medicine

## 2020-07-31 DIAGNOSIS — G4733 Obstructive sleep apnea (adult) (pediatric): Secondary | ICD-10-CM | POA: Diagnosis not present

## 2020-08-07 NOTE — Procedures (Signed)
[facility]  Polysomnogram Report Part I  Phone: [phone] Fax: [phone]  Patient Name: Bethany [name], Bethany [name] Acquisition Number: [id number]  Date of Birth: 09/04/1945 Acquisition Date: 07/31/2020  Referring Physician: Lynn Ito, PA-C     History: The patient is a 75 year old female with obstructive sleep apnea for CPAP titration. Medical History: diabetes, hypertension, hyperlipidemia, gastroesophageal reflux, arthritis, fibromyalgia, dyspnea.  Medications: amlodipine, asprin, atorvastatin, diclofenac sodium, docusate sodium, fluticasone, furosemide, insulin, losartan, metoprolol succiante, naproxen, naproxen sodium, omega-3 fatty acid, omeprazole, ondansetron, triamcinolone cream.  Procedure: This routine overnight polysomnogram was performed on the [place] 4 or 5 using the standard CPAP/BIPAP protocol. This included 6 channels of EEG, 2 channels of EOG, chin EMG, bilateral anterior tibialis EMG, nasal/oral thermister, PTAF (nasal pressure transducer), chest and abdominal wall movements, EKG, and pulse oximetry.  Description: The total recording time was 412.5 minutes. The total sleep time was 223.5 minutes. There were a total of 158.7 minutes of wakefulness after sleep onset for a poorsleep efficiency of 54.2%. The latency to sleep onset was slightly prolonged at 30.3 minutes. The R sleep onset latency was prolonged at 202.5 minutes. Sleep parameters, as a percentage of the total sleep time, demonstrated 1.3% of sleep was in N1 sleep, 67.8% N2, 0.0% N3 and 30.9% R sleep. There were a total of 11 arousals for an arousal index of 3.0 arousals per hour of sleep that was normal.  Overall, there were a total of 75 respiratory events for a respiratory disturbance index, which includes apneas, hypopneas and RERAs (increased respiratory effort) of 20.1 respiratory events per hour of sleep during the pressure titration. CPAP was initiated at  cm of H2O at lights out, 11:30:41 PM. It  was titrated in 1-2 cm increments to the final pressure of  cm of H2O.   Additionally, the baseline oxygen saturation during wakefulness was 93%, during NREM sleep averaged 98%, and during REM sleep averaged 96%. The total duration of oxygen < 90% was 12.9 minutes and <80% was 11.1 minutes.  Cardiac monitoring- did not demonstrate transient cardiac decelerations associated with the apneas. There were no significant cardiac rhythm irregularities.   Periodic limb movement monitoring- did not demonstrate periodic limb movements.   Impression: This patient's obstructive sleep apnea demonstrated significant improvement with the utilization of nasal CPAP.      Recommendations: Would recommend utilization of nasal CPAP at 9 cm of H2O.     A  mask, size , was used. Chin strap used during study- . Humidifier used during study- .     Yevonne Pax, MD [facility] Diplomate ABMS Pulmonary Critical Care and Sleep Medicine Electronically signed and Verified     [facility] CPAP/BIPAP Polysomnogram Report Part II Phone: [phone] Fax: [phone]  Patient last name [name] Neck Size 15.5 in. Acquisition [id number]  Patient first name Bethany Weight 300.0 lbs. Started 07/31/2020 at 11:26:59 PM  Birth date 01-20-1945 Height 57.0 in. Stopped 08/01/2020 at 6:23:47 AM  Age 75      Type Adult BMI 64.9 lb/in2 Duration 412.5  Report generated by: Hampton Abbot, RPSGT Sleep Data: Lights Out: 11:30:41 PM Sleep Onset: 12:00:59 AM  Lights On: 6:23:11 AM Sleep Efficiency: 54.2 %  Total Recording Time: 412.5 min Sleep Latency (from Lights Off) 30.3 min  Total Sleep Time (TST): 223.5 min R Latency (from Sleep Onset): 202.5 min  Sleep Period Time: 372.0 min Total number of awakenings: 4  Wake during sleep: 148.5 min Wake  After Sleep Onset (WASO): 158.7 min   Sleep Data:         Arousal Summary: Stage  Latency from lights out (min) Latency from sleep onset (min) Duration (min) % Total Sleep Time   Normal values  N 1 30.3 0.0 3.0 1.3 (5%)  N 2 30.8 0.5 151.5 67.8 (50%)  N 3       0.0 0.0 (20%)  R 232.8 202.5 69.0 30.9 (25%)    Number Index  Spontaneous 8 2.1  Apneas & Hypopneas 6 1.6  RERAs 0 0.0       (Apneas & Hypopneas & RERAs)  (6) (1.6)  Limb Movement 0 0.0  Snore 0 0.0  TOTAL 14 3.8     Respiratory Data:  CA OA MA Apnea Hypopnea* A+ H RERA Total  Number 0 0 0 0 75 75 0 75  Mean Dur (sec) 0.0 0.0 0.0 0.0 23.6 23.6 0.0 23.6  Max Dur (sec) 0.0 0.0 0.0 0.0 44.5 44.5 0.0 44.5  Total Dur (min) 0.0 0.0 0.0 0.0 29.5 29.5 0.0 29.5  % of TST 0.0 0.0 0.0 0.0 13.2 13.2 0.0 13.2  Index (#/h TST) 0.0 0.0 0.0 0.0 20.1 20.1 0.0 20.1  *Hypopneas scored based on 4% or greater desaturation.  Sleep Stage:         Body Position Data:    REM NREM TST  AHI 40.0 10.9 20.1  RDI 40.0 10.9 20.1    Sleep (min) TST (%) REM (min) NREM (min) CA (#) OA (#) MA (#) HYP (#) AHI (#/h) RERA (#) RDI (#/h) Desat (#)  Supine 223.5 100.00 69.0 154.5 0 0 0 75 20.1 0 20.1 109  Non-Supine 0.00 0.00 0.00 0.00 0.00 0.00 0.00 0.00 0.00 0 0.00 0.00       Snoring: Total number of snoring episodes  0  Total time with snoring    min (   % of sleep)   Oximetry Distribution:             WK REM NREM TOTAL  Average (%)   93 96 98 95  < 90% 11.3 1.6 0.0 12.9  < 80% 10.8 0.3 0.0 11.1  < 70% 10.8 0.1 0.0 10.9  # of Desaturations* 0 63 46 109  Desat Index (#/hour) 0.0 55.0 17.9 29.3  Desat Max (%) 0 32 8 32  Desat Max Dur (sec) 0.0 50.0 50.0 50.0  Approx Min O2 during sleep 61  Approx min O2 during a respiratory event 61  Was Oxygen added (Y/N) and final rate No:   0 LPM  *Desaturations based on 3% or greater drop from baseline.   Cheyne Stokes Breathing: None Present    Heart Rate Summary:  Average Heart Rate During Sleep 65.4 bpm      Highest Heart Rate During Sleep (95th %) 73.0 bpm      Highest Heart Rate During Sleep 100 bpm      Highest Heart Rate During Recording (TIB) 211  bpm (artifact)   Heart Rate Observations: Event Type # Events   Bradycardia 0 Lowest HR Scored: N/A  Sinus Tachycardia During Sleep 0 Highest HR Scored: N/A  Narrow Complex Tachycardia 0 Highest HR Scored: N/A  Wide Complex Tachycardia 0 Highest HR Scored: N/A  Asystole 0 Longest Pause: N/A  Atrial Fibrillation 0 Duration Longest Event: N/A  Other Arrythmias  No Type:   Periodic Limb Movement Data: (Primary legs unless otherwise noted) Total # Limb Movement 0 Limb Movement Index  0.0  Total # PLMS    PLMS Index     Total # PLMS Arousals    PLMS Arousal Index     Percentage Sleep Time with PLMS   min (   % sleep)  Mean Duration limb movements (secs)      Brief Summary: The pt appeared to tolerate CPAP well during the study..    Starting pressure:  5cm of H2O Maximum Pressure:  9cm of H2O  Mask Type: ResMed AirFit P10 Size: Medium  Humidifier used: Yes Chin Strap used: No  CFlex: EPR 1 BiFlex: n/a    IPAP Level (cmH2O) EPAP Level (cmH2O) Total Duration (min) Sleep Duration (min) Sleep (%) REM (%) CA  #) OA # MA # HYP #) AHI (#/hr) RERAs # RERAs (#/hr) RDI (#/hr)  5 5 191.6 89.2 46.6 0.0 0 0 0 13 8.7 0 0.0 8.7  7 7  133.8 107.1 80.0 43.7 0 0 0 62 34.7 0 0.0 34.7  9 9  26.6 26.6 100.0 39.5 0 0 0 0 0.0 0 0.0 0.0

## 2020-08-12 ENCOUNTER — Other Ambulatory Visit: Payer: Self-pay | Admitting: Internal Medicine

## 2020-08-12 DIAGNOSIS — K219 Gastro-esophageal reflux disease without esophagitis: Secondary | ICD-10-CM

## 2020-08-12 DIAGNOSIS — E7800 Pure hypercholesterolemia, unspecified: Secondary | ICD-10-CM

## 2020-08-16 ENCOUNTER — Other Ambulatory Visit: Payer: Self-pay

## 2020-08-16 ENCOUNTER — Ambulatory Visit: Payer: Medicare HMO

## 2020-08-16 DIAGNOSIS — E1151 Type 2 diabetes mellitus with diabetic peripheral angiopathy without gangrene: Secondary | ICD-10-CM | POA: Diagnosis not present

## 2020-08-24 ENCOUNTER — Ambulatory Visit (INDEPENDENT_AMBULATORY_CARE_PROVIDER_SITE_OTHER): Payer: Medicare HMO | Admitting: Physician Assistant

## 2020-08-24 ENCOUNTER — Other Ambulatory Visit: Payer: Self-pay

## 2020-08-24 ENCOUNTER — Encounter: Payer: Self-pay | Admitting: Physician Assistant

## 2020-08-24 VITALS — BP 146/82 | HR 78 | Temp 98.1°F | Resp 17 | Ht 67.0 in | Wt 305.8 lb

## 2020-08-24 DIAGNOSIS — N1831 Chronic kidney disease, stage 3a: Secondary | ICD-10-CM

## 2020-08-24 DIAGNOSIS — I10 Essential (primary) hypertension: Secondary | ICD-10-CM | POA: Diagnosis not present

## 2020-08-24 DIAGNOSIS — I5032 Chronic diastolic (congestive) heart failure: Secondary | ICD-10-CM | POA: Diagnosis not present

## 2020-08-24 DIAGNOSIS — E1121 Type 2 diabetes mellitus with diabetic nephropathy: Secondary | ICD-10-CM | POA: Diagnosis not present

## 2020-08-24 DIAGNOSIS — G4733 Obstructive sleep apnea (adult) (pediatric): Secondary | ICD-10-CM | POA: Diagnosis not present

## 2020-08-24 DIAGNOSIS — E1165 Type 2 diabetes mellitus with hyperglycemia: Secondary | ICD-10-CM

## 2020-08-24 DIAGNOSIS — IMO0002 Reserved for concepts with insufficient information to code with codable children: Secondary | ICD-10-CM

## 2020-08-24 DIAGNOSIS — R609 Edema, unspecified: Secondary | ICD-10-CM | POA: Diagnosis not present

## 2020-08-24 MED ORDER — AMLODIPINE BESYLATE 10 MG PO TABS: 10.0000 mg | ORAL_TABLET | Freq: Every day | ORAL | 0 refills | Status: DC

## 2020-08-24 MED ORDER — TRIAMCINOLONE ACETONIDE 0.1 % EX CREA: 1.0000 "application " | TOPICAL_CREAM | Freq: Four times a day (QID) | CUTANEOUS | 2 refills | Status: DC

## 2020-08-24 NOTE — Progress Notes (Signed)
[facility] [address] [place], [place] [address]  Internal MEDICINE  Office Visit Note  Patient Name: Bethany [name]  [id number]  [id number]  Date of Service: 08/29/2020  Chief Complaint  Patient presents with   Follow-up    [place] results, SS results    HPI Pt is here for routine follow up -She  is following up on sleep study results which did show she has Osa and a titration indicates she needs to be set up on a pressure of 9cm H2O. Discussed the importance of using CPAP. -Also reviewed arterial US of her LE which was normal -The past day or two anytime she eats something it has been going through her. Discussed trying bland diet and staying well hydrated. -She is having LE swelling and does have a history of CHF. She has not been taking her lasix regularly, and advised she take it for the next 3 days then use as needed. Will update CMP. Discussed updating her echo, but patient is interested in establishing with cardiology given hx.  Current Medication: Outpatient Encounter Medications as of 08/24/2020  Medication Sig   Alcohol Swabs (B-D SINGLE USE SWABS REGULAR) PADS USE TWICE DAILY   aspirin 81 MG tablet Take 81 mg by mouth daily. Reported on 03/29/2015   atorvastatin (LIPITOR) 20 MG tablet TAKE 1 TABLET EVERY DAY   Blood Glucose Monitoring Suppl (TRUE METRIX METER) DEVI by Does not apply route.   diclofenac Sodium (VOLTAREN) 1 % GEL Voltaren 1 % topical gel  APPLY 2 GRAMS TO THE AFFECTED AREA(S) BY TOPICAL ROUTE 4 TIMES PER DAY   docusate sodium (COLACE) 100 MG capsule Take 100 mg by mouth daily as needed for mild constipation. Reported on 03/29/2015   fluticasone (FLONASE) 50 MCG/ACT nasal spray Place 2 sprays into both nostrils daily.   furosemide (LASIX) 20 MG tablet TAKE 1 TABLET EVERY DAY AS NEEDED   glucose blood (TRUE METRIX BLOOD GLUCOSE TEST) test strip 1 each by Other route 4 (four) times daily.   insulin NPH-regular Human (NOVOLIN 70/30) (70-30) 100  UNIT/ML injection Inject 55 Units into the skin daily with breakfast. And syringes 1/day.   losartan (COZAAR) 50 MG tablet TAKE 1 TABLET EVERY DAY   metoprolol succinate (TOPROL-XL) 50 MG 24 hr tablet Take 1 tablet (50 mg total) by mouth daily. Take with or immediately following a meal.   naproxen (NAPROSYN) 500 MG tablet    naproxen sodium (ANAPROX) 220 MG tablet Take 220 mg by mouth as needed (for pain).    Omega-3 Fatty Acids (FISH OIL PO) Take 2,400 mg by mouth daily.    omeprazole (PRILOSEC) 20 MG capsule TAKE 1 CAPSULE EVERY DAY   TRUEPLUS LANCETS 33G MISC TEST BLOOD SUGAR TWICE DAILY   [DISCONTINUED] amLODipine (NORVASC) 10 MG tablet TAKE 1 TABLET EVERY DAY   [DISCONTINUED] triamcinolone cream (KENALOG) 0.1 % Apply 1 application topically 4 (four) times daily. As needed for itching   amLODipine (NORVASC) 10 MG tablet Take 1 tablet (10 mg total) by mouth daily.   triamcinolone cream (KENALOG) 0.1 % Apply 1 application topically 4 (four) times daily. As needed for itching   No facility-administered encounter medications on file as of 08/24/2020.    Surgical History: Past Surgical History:  Procedure Laterality Date   ABDOMINAL HYSTERECTOMY  1997   CATARACT EXTRACTION Bilateral 2014   CHOLECYSTECTOMY  2010   COLONOSCOPY     COLONOSCOPY WITH PROPOFOL N/A 04/21/2015   Procedure: COLONOSCOPY WITH PROPOFOL;  Surgeon: Wallace Cullens, MD;  Location: [facility] ENDOSCOPY;  Service: Gastroenterology;  Laterality: N/A;   EYE SURGERY Bilateral    Cataract   LUMBAR WOUND DEBRIDEMENT N/A 10/20/2014   Procedure: LUMBAR WOUND DEBRIDEMENT;  Surgeon: Tia Alert, MD;  Location: [facility] NEURO ORS;  Service: Neurosurgery;  Laterality: N/A;   MAXIMUM ACCESS (MAS)POSTERIOR LUMBAR INTERBODY FUSION (PLIF) 1 LEVEL N/A 09/16/2014   Procedure: L/4-5 FOR MAXIMUM ACCESS (MAS) POSTERIOR LUMBAR INTERBODY FUSION (PLIF) 1 LEVEL;  Surgeon: Tia Alert, MD;  Location: [facility] NEURO ORS;  Service: Neurosurgery;  Laterality: N/A;  L/4-5  FOR MAXIMUM ACCESS (MAS) POSTERIOR LUMBAR INTERBODY FUSION (PLIF) 1 LEVEL    Medical History: Past Medical History:  Diagnosis Date   Arthritis    Diabetes mellitus without complication (HCC)    Type II   Fibromyalgia    GERD (gastroesophageal reflux disease)    better after gall bladder surgery-    Hyperlipidemia    Hypertension    Shortness of breath dyspnea    with exertion    Family History: Family History  Problem Relation Age of Onset   Arthritis Mother    Cancer Mother        Ovarian   Diabetes Mother    Heart disease Father    Hypertension Father    Cancer Brother    Diabetes Brother     Social History   Socioeconomic History   Marital status: Married    Spouse name: Not on file   Number of children: Not on file   Years of education: Not on file   Highest education level: Not on file  Occupational History   Not on file  Tobacco Use   Smoking status: Never   Smokeless tobacco: Never  Substance and Sexual Activity   Alcohol use: No   Drug use: No   Sexual activity: Not Currently  Other Topics Concern   Not on file  Social History Narrative   Not on file   Social Determinants of Health   Financial Resource Strain: Not on file  Food Insecurity: Not on file  Transportation Needs: Not on file  Physical Activity: Not on file  Stress: Not on file  Social Connections: Not on file  Intimate Partner Violence: Not on file      Review of Systems  Constitutional:  Positive for fatigue. Negative for chills and unexpected weight change.  HENT:  Negative for congestion, postnasal drip, rhinorrhea, sneezing and sore throat.   Eyes:  Negative for redness.  Respiratory:  Positive for shortness of breath. Negative for cough, chest tightness and wheezing.   Cardiovascular:  Positive for leg swelling. Negative for chest pain and palpitations.  Gastrointestinal:  Negative for abdominal pain, constipation, diarrhea, nausea and vomiting.  Genitourinary:   Negative for dysuria and frequency.  Musculoskeletal:  Positive for arthralgias. Negative for back pain, joint swelling and neck pain.  Skin:  Negative for rash.  Neurological: Negative.  Negative for tremors and numbness.  Hematological:  Negative for adenopathy. Does not bruise/bleed easily.  Psychiatric/Behavioral:  Positive for sleep disturbance. Negative for behavioral problems (Depression) and suicidal ideas. The patient is not nervous/anxious.    Vital Signs: BP (!) 146/82   Pulse 78   Temp 98.1 F (36.7 C)   Resp 17   Ht 5\' 7"  (1.702 m)   Wt (!) 305 lb 12.8 oz (138.7 kg)   SpO2 98%   BMI 47.90 kg/m    Physical Exam Vitals and nursing note reviewed.  Constitutional:      General: She is not in acute distress.    Appearance: She is well-developed. She is obese. She is not diaphoretic.  HENT:     Head: Normocephalic and atraumatic.     Mouth/Throat:     Pharynx: No oropharyngeal exudate.  Eyes:     Pupils: Pupils are equal, round, and reactive to light.  Neck:     Thyroid: No thyromegaly.     Vascular: No JVD.     Trachea: No tracheal deviation.  Cardiovascular:     Rate and Rhythm: Normal rate and regular rhythm.     Heart sounds: Normal heart sounds. No murmur heard.   No friction rub. No gallop.  Pulmonary:     Effort: Pulmonary effort is normal. No respiratory distress.     Breath sounds: No wheezing or rales.  Chest:     Chest wall: No tenderness.  Abdominal:     General: Bowel sounds are normal.     Palpations: Abdomen is soft.  Musculoskeletal:        General: Normal range of motion.     Cervical back: Normal range of motion and neck supple.     Right lower leg: Edema present.     Left lower leg: Edema present.  Lymphadenopathy:     Cervical: No cervical adenopathy.  Skin:    General: Skin is warm and dry.  Neurological:     Mental Status: She is alert and oriented to person, place, and time.     Cranial Nerves: No cranial nerve deficit.   Psychiatric:        Behavior: Behavior normal.        Thought Content: Thought content normal.        Judgment: Judgment normal.       Assessment/Plan: 1. Essential hypertension Continue current medications. Will also have patient take lasix for 3 days then go back to as needed and this may help with BP further. She should monitor closely to ensure bp does not drop too low. - Comprehensive metabolic panel - amLODipine (NORVASC) 10 MG tablet; Take 1 tablet (10 mg total) by mouth daily.  Dispense: 90 tablet; Refill: 0 - Ambulatory referral to Cardiology  2. OSA (obstructive sleep apnea) Based on titration pt will be set up on CPAP at 9cm H2O. - For home use only DME continuous positive airway pressure (CPAP)  3. Edema, unspecified type Will take lasix for 3 days then use as needed and will update CMP. Referral to cardiology made based on hx of CHF with LE edema. Will likely need updated echo which patient would like to establish with cardiology. - Ambulatory referral to Cardiology  4. CHF (congestive heart failure), NYHA class I, chronic, diastolic (HCC)  Referral to cardiology made based on hx of CHF with LE edema and some SOB. Will likely need updated echo which patient would like to wait and establish with cardiology. - Ambulatory referral to Cardiology  5. 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 Stage 3a chronic kidney disease (HCC) Followed by nephrology   General Counseling: Ocean verbalizes understanding of the findings of todays visit and agrees with plan of treatment. I have discussed any further diagnostic evaluation that may be needed or ordered today. We also reviewed her medications today. she has been encouraged to call the office with any questions or concerns that should arise related to todays visit.    Orders Placed This Encounter  Procedures  For home use only DME continuous positive airway pressure  (CPAP)   Comprehensive metabolic panel   Ambulatory referral to Cardiology    Meds ordered this encounter  Medications   triamcinolone cream (KENALOG) 0.1 %    Sig: Apply 1 application topically 4 (four) times daily. As needed for itching    Dispense:  80 g    Refill:  2   amLODipine (NORVASC) 10 MG tablet    Sig: Take 1 tablet (10 mg total) by mouth daily.    Dispense:  90 tablet    Refill:  0    This patient was seen by Lynn ItoLauren Aleck Locklin, PA-C in collaboration with Dr. Beverely RisenFozia Khan as a part of collaborative care agreement.   Total time spent:35 Minutes Time spent includes review of chart, medications, test results, and follow up plan with the patient.      Dr Lyndon CodeFozia M Khan Internal medicine

## 2020-08-29 NOTE — Patient Instructions (Signed)
Living With Sleep Apnea Sleep apnea is a condition in which breathing pauses or becomes shallow during sleep. Sleep apnea is most commonly caused by a collapsed or blocked airway. People with sleep apnea usually snore loudly. They may have times when they gasp and stop breathing for 10 seconds or more during sleep. This may happenmany times during the night. The breaks in breathing also interrupt the deep sleep that you need to feel rested. Even if you do not completely wake up from the gaps in breathing, your sleep may not be restful and you feel tired during the day. You may also have a headache in the morning and low energy during the day, and you may feel anxiousor depressed. How can sleep apnea affect me? Sleep apnea increases your chances of extreme tiredness during the day (daytime fatigue). It can also increase your risk for health conditions, such as: Heart attack. Stroke. Obesity. Type 2 diabetes. Heart failure. Irregular heartbeat. High blood pressure. If you have daytime fatigue as a result of sleep apnea, you may be more likely to: Perform poorly at school or work. Fall asleep while driving. Have difficulty with attention. Develop depression or anxiety. Have sexual dysfunction. What actions can I take to manage sleep apnea? Sleep apnea treatment  If you were given a device to open your airway while you sleep, use it only as told by your health care provider. You may be given: An oral appliance. This is a custom-made mouthpiece that shifts your lower jaw forward. A continuous positive airway pressure (CPAP) device. This device blows air through a mask when you breathe out (exhale). A nasal expiratory positive airway pressure (EPAP) device. This device has valves that you put into each nostril. A bi-level positive airway pressure (BPAP) device. This device blows air through a mask when you breathe in (inhale) and breathe out (exhale). You may need surgery if other treatments do  not work for you.  Sleep habits Go to sleep and wake up at the same time every day. This helps set your internal clock (circadian rhythm) for sleeping. If you stay up later than usual, such as on weekends, try to get up in the morning within 2 hours of your normal wake time. Try to get at least 7-9 hours of sleep each night. Stop using a computer, tablet, and mobile phone a few hours before bedtime. Do not take long naps during the day. If you nap, limit it to 30 minutes. Have a relaxing bedtime routine. Reading or listening to music may relax you and help you sleep. Use your bedroom only for sleep. Keep your television and computer out of your bedroom. Keep your bedroom cool, dark, and quiet. Use a supportive mattress and pillows. Follow your health care provider's instructions for other changes to sleep habits. Nutrition Do not eat heavy meals in the evening. Do not have caffeine in the later part of the day. The effects of caffeine can last for more than 5 hours. Follow your health care provider's or dietitian's instructions for any diet changes. Lifestyle     Do not drink alcohol before bedtime. Alcohol can cause you to fall asleep at first, but then it can cause you to wake up in the middle of the night and have trouble getting back to sleep. Do not use any products that contain nicotine or tobacco. These products include cigarettes, chewing tobacco, and vaping devices, such as e-cigarettes. If you need help quitting, ask your health care provider. Medicines Take over-the-counter   and prescription medicines only as told by your health care provider. Do not use over-the-counter sleep medicine. You can become dependent on this medicine, and it can make sleep apnea worse. Do not use medicines, such as sedatives and narcotics, unless told by your health care provider. Activity Exercise on most days, but avoid exercising in the evening. Exercising near bedtime can interfere with  sleeping. If possible, spend time outside every day. Natural light helps regulate your circadian rhythm. General information Lose weight if you need to, and maintain a healthy weight. Keep all follow-up visits. This is important. If you are having surgery, make sure to tell your health care provider that you have sleep apnea. You may need to bring your device with you. Where to find more information Learn more about sleep apnea and daytime fatigue from: American Sleep Association: sleepassociation.org National Sleep Foundation: sleepfoundation.org National Heart, Lung, and Blood Institute: nhlbi.nih.gov Summary Sleep apnea is a condition in which breathing pauses or becomes shallow during sleep. Sleep apnea can cause daytime fatigue and other serious health conditions. You may need to wear a device while sleeping to help keep your airway open. If you are having surgery, make sure to tell your health care provider that you have sleep apnea. You may need to bring your device with you. Making changes to sleep habits, diet, lifestyle, and activity can help you manage sleep apnea. This information is not intended to replace advice given to you by your health care provider. Make sure you discuss any questions you have with your healthcare provider. Document Revised: 12/10/2019 Document Reviewed: 12/10/2019 Elsevier Patient Education  2022 Elsevier Inc.  

## 2020-09-04 ENCOUNTER — Telehealth: Payer: Self-pay

## 2020-09-04 NOTE — Telephone Encounter (Signed)
Cpap order with settings of 9 cmH2o has been sent to [place] home Patient.

## 2020-09-20 DIAGNOSIS — G4733 Obstructive sleep apnea (adult) (pediatric): Secondary | ICD-10-CM | POA: Diagnosis not present

## 2020-10-04 DIAGNOSIS — I509 Heart failure, unspecified: Secondary | ICD-10-CM | POA: Diagnosis not present

## 2020-10-04 DIAGNOSIS — R609 Edema, unspecified: Secondary | ICD-10-CM | POA: Diagnosis not present

## 2020-10-04 DIAGNOSIS — N1831 Chronic kidney disease, stage 3a: Secondary | ICD-10-CM | POA: Diagnosis not present

## 2020-10-04 DIAGNOSIS — E1122 Type 2 diabetes mellitus with diabetic chronic kidney disease: Secondary | ICD-10-CM | POA: Diagnosis not present

## 2020-10-04 DIAGNOSIS — I10 Essential (primary) hypertension: Secondary | ICD-10-CM | POA: Diagnosis not present

## 2020-10-10 ENCOUNTER — Encounter: Payer: Self-pay | Admitting: Cardiovascular Disease

## 2020-10-10 ENCOUNTER — Other Ambulatory Visit: Payer: Self-pay

## 2020-10-10 ENCOUNTER — Ambulatory Visit: Payer: Medicare HMO | Admitting: Cardiovascular Disease

## 2020-10-10 VITALS — BP 144/72 | HR 80 | Ht 68.0 in | Wt 309.8 lb

## 2020-10-10 DIAGNOSIS — I10 Essential (primary) hypertension: Secondary | ICD-10-CM | POA: Diagnosis not present

## 2020-10-10 DIAGNOSIS — E785 Hyperlipidemia, unspecified: Secondary | ICD-10-CM

## 2020-10-10 DIAGNOSIS — R0602 Shortness of breath: Secondary | ICD-10-CM | POA: Diagnosis not present

## 2020-10-10 NOTE — Progress Notes (Signed)
Cardiology Office Note   Date:  10/11/2020   ID:  Bethany [name], Bethany [name] May 29, 1945, MRN [id number]  PCP:  Carlean Jews, PA-C  Cardiologist:   Lorine Bears, MD   Chief Complaint  Patient presents with   Other    HTN/EDEMA/SOB. Meds reviewed verbally with pt.      History of Present Illness: Bethany [name] is a 75 y.o. female who was referred by Lynn Ito for evaluation of congestive heart failure. She has known history of diabetes mellitus, stage III chronic kidney disease, essential hypertension, hyperlipidemia, morbid obesity, and obstructive sleep apnea. Previous echocardiogram in October 2019 showed normal LV systolic function, grade 1 diastolic dysfunction, normal pulmonary pressure and no significant valvular abnormalities. She reports having COVID in September 2021 and since then she had worsening shortness of breath, fatigue and gradual 30 pound weight gain with increased leg edema.  She also reports right-sided chest pain described as cramping sensation at rest lasting for few seconds.  She is not a smoker.  She reports that her father died of myocardial infarction. She was recently started on CPAP but has not been tolerating it very well.  She does report mild orthopnea.  Past Medical History:  Diagnosis Date   Arthritis    Chronic kidney disease    Diabetes mellitus without complication (HCC)    Type II   Fibromyalgia    GERD (gastroesophageal reflux disease)    better after gall bladder surgery-    Hyperlipidemia    Hypertension    Shortness of breath dyspnea    with exertion    Past Surgical History:  Procedure Laterality Date   ABDOMINAL HYSTERECTOMY  1997   CATARACT EXTRACTION Bilateral 2014   CHOLECYSTECTOMY  2010   COLONOSCOPY     COLONOSCOPY WITH PROPOFOL N/A 04/21/2015   Procedure: COLONOSCOPY WITH PROPOFOL;  Surgeon: Wallace Cullens, MD;  Location: [facility] ENDOSCOPY;  Service: Gastroenterology;  Laterality: N/A;   EYE SURGERY Bilateral     Cataract   LUMBAR WOUND DEBRIDEMENT N/A 10/20/2014   Procedure: LUMBAR WOUND DEBRIDEMENT;  Surgeon: Tia Alert, MD;  Location: [facility] NEURO ORS;  Service: Neurosurgery;  Laterality: N/A;   MAXIMUM ACCESS (MAS)POSTERIOR LUMBAR INTERBODY FUSION (PLIF) 1 LEVEL N/A 09/16/2014   Procedure: L/4-5 FOR MAXIMUM ACCESS (MAS) POSTERIOR LUMBAR INTERBODY FUSION (PLIF) 1 LEVEL;  Surgeon: Tia Alert, MD;  Location: [facility] NEURO ORS;  Service: Neurosurgery;  Laterality: N/A;  L/4-5 FOR MAXIMUM ACCESS (MAS) POSTERIOR LUMBAR INTERBODY FUSION (PLIF) 1 LEVEL     Current Outpatient Medications  Medication Sig Dispense Refill   Alcohol Swabs (B-D SINGLE USE SWABS REGULAR) PADS USE TWICE DAILY 200 each 2   amLODipine (NORVASC) 10 MG tablet Take 1 tablet (10 mg total) by mouth daily. 90 tablet 0   aspirin 81 MG tablet Take 81 mg by mouth daily. Reported on 03/29/2015     atorvastatin (LIPITOR) 20 MG tablet TAKE 1 TABLET EVERY DAY 90 tablet 1   Blood Glucose Monitoring Suppl (TRUE METRIX METER) DEVI by Does not apply route.     diclofenac Sodium (VOLTAREN) 1 % GEL Voltaren 1 % topical gel  APPLY 2 GRAMS TO THE AFFECTED AREA(S) BY TOPICAL ROUTE 4 TIMES PER DAY     docusate sodium (COLACE) 100 MG capsule Take 100 mg by mouth daily as needed for mild constipation. Reported on 03/29/2015     furosemide (LASIX) 20 MG tablet TAKE 1 TABLET EVERY DAY AS NEEDED 90 tablet 0  glucose blood (TRUE METRIX BLOOD GLUCOSE TEST) test strip 1 each by Other route 4 (four) times daily. 400 each 1   insulin NPH-regular Human (NOVOLIN 70/30) (70-30) 100 UNIT/ML injection Inject 55 Units into the skin daily with breakfast. And syringes 1/day. 60 mL 11   losartan (COZAAR) 50 MG tablet TAKE 1 TABLET EVERY DAY 90 tablet 1   metoprolol succinate (TOPROL-XL) 50 MG 24 hr tablet Take 1 tablet (50 mg total) by mouth daily. Take with or immediately following a meal. 90 tablet 3   naproxen (NAPROSYN) 500 MG tablet Take 500 mg by mouth daily as needed.      omeprazole (PRILOSEC) 20 MG capsule TAKE 1 CAPSULE EVERY DAY 90 capsule 1   triamcinolone cream (KENALOG) 0.1 % Apply 1 application topically 4 (four) times daily. As needed for itching 80 g 2   TRUEPLUS LANCETS 33G MISC TEST BLOOD SUGAR TWICE DAILY 200 each 2   fluticasone (FLONASE) 50 MCG/ACT nasal spray Place 2 sprays into both nostrils daily. (Patient not taking: Reported on 10/10/2020) 16 g 6   naproxen sodium (ANAPROX) 220 MG tablet Take 220 mg by mouth as needed (for pain).  (Patient not taking: Reported on 10/10/2020)     Omega-3 Fatty Acids (FISH OIL PO) Take 2,400 mg by mouth daily.  (Patient not taking: Reported on 10/10/2020)     No current facility-administered medications for this visit.    Allergies:   Patient has no known allergies.    Social History:  The patient  reports that she has never smoked. She has never used smokeless tobacco. She reports that she does not drink alcohol and does not use drugs.   Family History:  The patient's family history includes Arthritis in her mother; Cancer in her brother and mother; Diabetes in her brother and mother; Heart attack in her father; Heart disease in her father; Hypertension in her father.    ROS:  Please see the history of present illness.   Otherwise, review of systems are positive for none.   All other systems are reviewed and negative.    PHYSICAL EXAM: VS:  BP (!) 144/72 (BP Location: Right Arm, Patient Position: Sitting, Cuff Size: Large)   Pulse 80   Ht 5\' 8"  (1.727 m)   Wt (!) 309 lb 12 oz (140.5 kg)   SpO2 97%   BMI 47.10 kg/m  , BMI Body mass index is 47.1 kg/m. GEN: Well nourished, well developed, in no acute distress  HEENT: normal  Neck: no JVD, carotid bruits, or masses Cardiac: RRR; no rubs, or gallops, 2 out of 6 systolic murmur in the aortic area which is early peaking.  There is mild to moderate bilateral leg edema Respiratory:  clear to auscultation bilaterally, normal work of breathing GI: soft,  nontender, nondistended, + BS MS: no deformity or atrophy  Skin: warm and dry, no rash Neuro:  Strength and sensation are intact Psych: euthymic mood, full affect   EKG:  EKG is ordered today. The ekg ordered today demonstrates normal sinus rhythm with first-degree AV block and no significant ST or T wave changes.   Recent Labs: 01/27/2020: ALT 10; BUN 15; Creatinine, Ser 1.21; Hemoglobin 12.2; Platelets 270.0; Potassium 4.0; Sodium 142    Lipid Panel    Component Value Date/Time   CHOL 126 01/27/2020 1618   TRIG 118.0 01/27/2020 1618   HDL 49.90 01/27/2020 1618   CHOLHDL 3 01/27/2020 1618   VLDL 23.6 01/27/2020 1618   LDLCALC 52  01/27/2020 1618      Wt Readings from Last 3 Encounters:  10/10/20 (!) 309 lb 12 oz (140.5 kg)  08/24/20 (!) 305 lb 12.8 oz (138.7 kg)  07/26/20 (!) 305 lb (138.3 kg)       PAD Screen 10/10/2020  Previous PAD dx? Yes  Previous surgical procedure? Yes  Dates of procedures Back surgery, kidney stones  Pain with walking? Yes  Subsides with rest? No  Feet/toe relief with dangling? No  Painful, non-healing ulcers? No  Extremities discolored? No      ASSESSMENT AND PLAN:  1.  Exertional dyspnea with possible chronic diastolic heart failure: The patient reports worsening dyspnea and gradual weight gain since she had COVID last year.  By exam, her lungs are clear and there is no JVD but she does have lower extremity edema.  Leg edema by itself might be due to chronic venous insufficiency and secondary lymphedema. I requested an echocardiogram for evaluation. We should consider exertional dyspnea being anginal equivalent given her diabetic status and multiple risk factors for ischemic heart disease.  Stress testing will be problematic given her morbid obesity.  CTA is not a good option given underlying chronic kidney disease.  Reevaluate symptoms after echo in 2 months.  If he continues to have dyspnea with minimal exertion, might be best to proceed  with heart catheterization for definitive diagnosis.  2.  Essential hypertension: Blood pressure is reasonably controlled on current medications.  3.  Hyperlipidemia: Currently on atorvastatin.  Recommended target LDL of less than 70 given that she is diabetic.   Disposition:   FU with me in 2 months  Signed,  Lorine Bears, MD  10/11/2020 3:34 PM    [facility]

## 2020-10-10 NOTE — Patient Instructions (Signed)
Medication Instructions:  Your physician recommends that you continue on your current medications as directed. Please refer to the Current Medication list given to you today.  *If you need a refill on your cardiac medications before your next appointment, please call your pharmacy*   Lab Work: None ordered If you have labs (blood work) drawn today and your tests are completely normal, you will receive your results only by: MyChart Message (if you have MyChart) OR A paper copy in the mail If you have any lab test that is abnormal or we need to change your treatment, we will call you to review the results.   Testing/Procedures: Your physician has requested that you have an echocardiogram. Echocardiography is a painless test that uses sound waves to create images of your heart. It provides your doctor with information about the size and shape of your heart and how well your heart's chambers and valves are working. This procedure takes approximately one hour. There are no restrictions for this procedure.    Follow-Up: At [facility], you and your health needs are our priority.  As part of our continuing mission to provide you with exceptional heart care, we have created designated Provider Care Teams.  These Care Teams include your primary Cardiologist (physician) and Advanced Practice Providers (APPs -  Physician Assistants and Nurse Practitioners) who all work together to provide you with the care you need, when you need it.  We recommend signing up for the patient portal called "MyChart".  Sign up information is provided on this After Visit Summary.  MyChart is used to connect with patients for Virtual Visits (Telemedicine).  Patients are able to view lab/test results, encounter notes, upcoming appointments, etc.  Non-urgent messages can be sent to your provider as well.   To learn more about what you can do with MyChart, go to [email or web address].    Your next appointment:   2  month(s)  The format for your next appointment:   In Person  Provider:   You may see Lorine Bears, MD or one of the following Advanced Practice Providers on your designated Care Team:   Nicolasa Ducking, NP Eula Listen, PA-C Marisue Ivan, PA-C Cadence Fransico Michael, [place]   Other Instructions Echocardiogram An echocardiogram is a test that uses sound waves (ultrasound) to produce images of the heart. Images from an echocardiogram can provide important information about: Heart size and shape. The size and thickness and movement of your heart's walls. Heart muscle function and strength. Heart valve function or if you have stenosis. Stenosis is when the heart valves are too narrow. If blood is flowing backward through the heart valves (regurgitation). A tumor or infectious growth around the heart valves. Areas of heart muscle that are not working well because of poor blood flow or injury from a heart attack. Aneurysm detection. An aneurysm is a weak or damaged part of an artery wall. The wall bulges out from the normal force of blood pumping through the body. Tell a health care provider about: Any allergies you have. All medicines you are taking, including vitamins, herbs, eye drops, creams, and over-the-counter medicines. Any blood disorders you have. Any surgeries you have had. Any medical conditions you have. Whether you are pregnant or may be pregnant. What are the risks? Generally, this is a safe test.  What happens before the test? No specific preparation is needed. You may eat and drink normally. What happens during the test?  You will take off your clothes from the waist up  and put on a hospital gown. Electrodes or electrocardiogram (ECG)patches may be placed on your chest. The electrodes or patches are then connected to a device that monitors your heart rate and rhythm. You will lie down on a table for an ultrasound exam. A gel will be applied to your chest to help sound  waves pass through your skin. A handheld device, called a transducer, will be pressed against your chest and moved over your heart. The transducer produces sound waves that travel to your heart and bounce back (or "echo" back) to the transducer. These sound waves will be captured in real-time and changed into images of your heart that can be viewed on a video monitor. The images will be recorded on a computer and reviewed by your health care provider. You may be asked to change positions or hold your breath for a short time. This makes it easier to get different views or better views of your heart. In some cases, you may receive an image enhancer through an IV in one of your veins. This can improve the quality of the pictures from your heart. The procedure may vary among health care providers and hospitals. What can I expect after the test? You may return to your normal, everyday life, including diet, activities, and medicines, unless your health care provider tells you not to do that. Follow these instructions at home: It is up to you to get the results of your test. Ask your health care provider, or the department that is doing the test, when your results will be ready. Keep all follow-up visits. This is important. Summary An echocardiogram is a test that uses sound waves (ultrasound) to produce images of the heart. Images from an echocardiogram can provide important information about the size and shape of your heart, heart muscle function, heart valve function, and other possible heart problems. You do not need to do anything to prepare before this test. You may eat and drink normally. After the echocardiogram is completed, you may return to your normal, everyday life, unless your health care provider tells you not to do that. This information is not intended to replace advice given to you by your health care provider. Make sure you discuss any questions you have with your health care  provider. Document Revised: 08/24/2019 Document Reviewed: 08/24/2019 Elsevier Patient Education  2022 Elsevier Inc.  Low-Sodium Eating Plan Sodium, which is an element that makes up salt, helps you maintain a healthy balance of fluids in your body. Too much sodium can increase your blood pressure and cause fluid and waste to be held in your body. Your health care provider or dietitian may recommend following this plan if you have high blood pressure (hypertension), kidney disease, liver disease, or heart failure. Eating less sodium can help lower your blood pressure, reduce swelling, and protect your heart, liver, and kidneys. What are tips for following this plan? Reading food labels The Nutrition Facts label lists the amount of sodium in one serving of the food. If you eat more than one serving, you must multiply the listed amount of sodium by the number of servings. Choose foods with less than 140 mg of sodium per serving. Avoid foods with 300 mg of sodium or more per serving. Shopping  Look for lower-sodium products, often labeled as "low-sodium" or "no salt added." Always check the sodium content, even if foods are labeled as "unsalted" or "no salt added." Buy fresh foods. Avoid canned foods and pre-made or frozen meals. Avoid  canned, cured, or processed meats. Buy breads that have less than 80 mg of sodium per slice. Cooking  Eat more home-cooked food and less restaurant, buffet, and fast food. Avoid adding salt when cooking. Use salt-free seasonings or herbs instead of table salt or sea salt. Check with your health care provider or pharmacist before using salt substitutes. Cook with plant-based oils, such as canola, sunflower, or olive oil. Meal planning When eating at a restaurant, ask that your food be prepared with less salt or no salt, if possible. Avoid dishes labeled as brined, pickled, cured, smoked, or made with soy sauce, miso, or teriyaki sauce. Avoid foods that contain  MSG (monosodium glutamate). MSG is sometimes added to [place] food, bouillon, and some canned foods. Make meals that can be grilled, baked, poached, roasted, or steamed. These are generally made with less sodium. General information Most people on this plan should limit their sodium intake to 1,500-2,000 mg (milligrams) of sodium each day. What foods should I eat? Fruits Fresh, frozen, or canned fruit. Fruit juice. Vegetables Fresh or frozen vegetables. "No salt added" canned vegetables. "No salt added" tomato sauce and paste. Low-sodium or reduced-sodium tomato and vegetable juice. Grains Low-sodium cereals, including oats, puffed wheat and rice, and shredded wheat. Low-sodium crackers. Unsalted rice. Unsalted pasta. Low-sodium bread. Whole-grain breads and whole-grain pasta. Meats and other proteins Fresh or frozen (no salt added) meat, poultry, seafood, and fish. Low-sodium canned tuna and salmon. Unsalted nuts. Dried peas, beans, and lentils without added salt. Unsalted canned beans. Eggs. Unsalted nut butters. Dairy Milk. Soy milk. Cheese that is naturally low in sodium, such as ricotta cheese, fresh mozzarella, or Swiss cheese. Low-sodium or reduced-sodium cheese. Cream cheese. Yogurt. Seasonings and condiments Fresh and dried herbs and spices. Salt-free seasonings. Low-sodium mustard and ketchup. Sodium-free salad dressing. Sodium-free light mayonnaise. Fresh or refrigerated horseradish. Lemon juice. Vinegar. Other foods Homemade, reduced-sodium, or low-sodium soups. Unsalted popcorn and pretzels. Low-salt or salt-free chips. The items listed above may not be a complete list of foods and beverages you can eat. Contact a dietitian for more information. What foods should I avoid? Vegetables Sauerkraut, pickled vegetables, and relishes. Olives. [place] fries. Onion rings. Regular canned vegetables (not low-sodium or reduced-sodium). Regular canned tomato sauce and paste (not low-sodium or  reduced-sodium). Regular tomato and vegetable juice (not low-sodium or reduced-sodium). Frozen vegetables in sauces. Grains Instant hot cereals. Bread stuffing, pancake, and biscuit mixes. Croutons. Seasoned rice or pasta mixes. Noodle soup cups. Boxed or frozen macaroni and cheese. Regular salted crackers. Self-rising flour. Meats and other proteins Meat or fish that is salted, canned, smoked, spiced, or pickled. Precooked or cured meat, such as sausages or meat loaves. Tomasa Blase. Ham. Pepperoni. Hot dogs. Corned beef. Chipped beef. Salt pork. Jerky. Pickled herring. Anchovies and sardines. Regular canned tuna. Salted nuts. Dairy Processed cheese and cheese spreads. Hard cheeses. Cheese curds. Blue cheese. Feta cheese. String cheese. Regular cottage cheese. Buttermilk. Canned milk. Fats and oils Salted butter. Regular margarine. Ghee. Bacon fat. Seasonings and condiments Onion salt, garlic salt, seasoned salt, table salt, and sea salt. Canned and packaged gravies. Worcestershire sauce. Tartar sauce. Barbecue sauce. Teriyaki sauce. Soy sauce, including reduced-sodium. Steak sauce. Fish sauce. Oyster sauce. Cocktail sauce. Horseradish that you find on the shelf. Regular ketchup and mustard. Meat flavorings and tenderizers. Bouillon cubes. Hot sauce. Pre-made or packaged marinades. Pre-made or packaged taco seasonings. Relishes. Regular salad dressings. Salsa. Other foods Salted popcorn and pretzels. Corn chips and puffs. Potato and tortilla chips. Canned  or dried soups. Pizza. Frozen entrees and pot pies. The items listed above may not be a complete list of foods and beverages you should avoid. Contact a dietitian for more information. Summary Eating less sodium can help lower your blood pressure, reduce swelling, and protect your heart, liver, and kidneys. Most people on this plan should limit their sodium intake to 1,500-2,000 mg (milligrams) of sodium each day. Canned, boxed, and frozen foods are high  in sodium. Restaurant foods, fast foods, and pizza are also very high in sodium. You also get sodium by adding salt to food. Try to cook at home, eat more fresh fruits and vegetables, and eat less fast food and canned, processed, or prepared foods. This information is not intended to replace advice given to you by your health care provider. Make sure you discuss any questions you have with your health care provider. Document Revised: 02/05/2019 Document Reviewed: 12/02/2018 Elsevier Patient Education  2022 [employer].

## 2020-10-11 DIAGNOSIS — G4733 Obstructive sleep apnea (adult) (pediatric): Secondary | ICD-10-CM | POA: Diagnosis not present

## 2020-10-19 DIAGNOSIS — H43813 Vitreous degeneration, bilateral: Secondary | ICD-10-CM | POA: Diagnosis not present

## 2020-10-20 DIAGNOSIS — G4733 Obstructive sleep apnea (adult) (pediatric): Secondary | ICD-10-CM | POA: Diagnosis not present

## 2020-10-26 ENCOUNTER — Other Ambulatory Visit: Payer: Self-pay

## 2020-10-26 ENCOUNTER — Ambulatory Visit (INDEPENDENT_AMBULATORY_CARE_PROVIDER_SITE_OTHER): Payer: Medicare HMO | Admitting: Endocrinology

## 2020-10-26 VITALS — BP 156/64 | HR 86 | Ht 68.0 in | Wt 311.6 lb

## 2020-10-26 DIAGNOSIS — N183 Chronic kidney disease, stage 3 unspecified: Secondary | ICD-10-CM

## 2020-10-26 DIAGNOSIS — E1122 Type 2 diabetes mellitus with diabetic chronic kidney disease: Secondary | ICD-10-CM

## 2020-10-26 DIAGNOSIS — Z794 Long term (current) use of insulin: Secondary | ICD-10-CM | POA: Diagnosis not present

## 2020-10-26 DIAGNOSIS — E119 Type 2 diabetes mellitus without complications: Secondary | ICD-10-CM

## 2020-10-26 LAB — POCT GLYCOSYLATED HEMOGLOBIN (HGB A1C): Hemoglobin A1C: 9.7 % — AB (ref 4.0–5.6)

## 2020-10-26 MED ORDER — FREESTYLE LIBRE 2 SENSOR MISC: 1.0000 | 3 refills | Status: DC

## 2020-10-26 NOTE — Progress Notes (Signed)
Subjective:    Patient ID: Bethany [name], female    DOB: 06-Jun-1945, 75 y.o.   MRN: [id number]  HPI Pt returns for f/u of diabetes mellitus: DM type: Insulin-requiring type 2 Dx'ed: 1998.  Complications: PN and stage 3 CRI.  Therapy: insulin since 2009.  GDM: never.   DKA: never.   Severe hypoglycemia: once (2020). Pancreatitis: never. Other: she takes QD insulin, after poor results with multiple daily injections, but she often eats just 2 meals per day; she changed to 70/30, due to pattern of cbg's.    SDOH: she takes human insulin, due to cost.   Interval history: She seldom has hypoglycemia, and these episodes are mild. This happens in the middle of the night.  She had another steroid injection into the knee 2 weeks ago.  This increased cbg to the 300's.  Prior to that, cbg varied from 73-230.  It is in general higher as the day goes on.   Past Medical History:  Diagnosis Date   Arthritis    Chronic kidney disease    Diabetes mellitus without complication (HCC)    Type II   Fibromyalgia    GERD (gastroesophageal reflux disease)    better after gall bladder surgery-    Hyperlipidemia    Hypertension    Shortness of breath dyspnea    with exertion    Past Surgical History:  Procedure Laterality Date   ABDOMINAL HYSTERECTOMY  1997   CATARACT EXTRACTION Bilateral 2014   CHOLECYSTECTOMY  2010   COLONOSCOPY     COLONOSCOPY WITH PROPOFOL N/A 04/21/2015   Procedure: COLONOSCOPY WITH PROPOFOL;  Surgeon: Wallace Cullens, MD;  Location: [facility] ENDOSCOPY;  Service: Gastroenterology;  Laterality: N/A;   EYE SURGERY Bilateral    Cataract   LUMBAR WOUND DEBRIDEMENT N/A 10/20/2014   Procedure: LUMBAR WOUND DEBRIDEMENT;  Surgeon: Tia Alert, MD;  Location: [facility] NEURO ORS;  Service: Neurosurgery;  Laterality: N/A;   MAXIMUM ACCESS (MAS)POSTERIOR LUMBAR INTERBODY FUSION (PLIF) 1 LEVEL N/A 09/16/2014   Procedure: L/4-5 FOR MAXIMUM ACCESS (MAS) POSTERIOR LUMBAR INTERBODY FUSION (PLIF) 1 LEVEL;   Surgeon: Tia Alert, MD;  Location: [facility] NEURO ORS;  Service: Neurosurgery;  Laterality: N/A;  L/4-5 FOR MAXIMUM ACCESS (MAS) POSTERIOR LUMBAR INTERBODY FUSION (PLIF) 1 LEVEL    Social History   Socioeconomic History   Marital status: Widowed    Spouse name: Not on file   Number of children: Not on file   Years of education: Not on file   Highest education level: Not on file  Occupational History   Not on file  Tobacco Use   Smoking status: Never   Smokeless tobacco: Never  Vaping Use   Vaping Use: Never used  Substance and Sexual Activity   Alcohol use: No   Drug use: No   Sexual activity: Not Currently  Other Topics Concern   Not on file  Social History Narrative   Not on file   Social Determinants of Health   Financial Resource Strain: Not on file  Food Insecurity: Not on file  Transportation Needs: Not on file  Physical Activity: Not on file  Stress: Not on file  Social Connections: Not on file  Intimate Partner Violence: Not on file    Current Outpatient Medications on File Prior to Visit  Medication Sig Dispense Refill   Alcohol Swabs (B-D SINGLE USE SWABS REGULAR) PADS USE TWICE DAILY 200 each 2   aspirin 81 MG tablet Take 81 mg by mouth  daily. Reported on 03/29/2015     atorvastatin (LIPITOR) 20 MG tablet TAKE 1 TABLET EVERY DAY 90 tablet 1   Blood Glucose Monitoring Suppl (TRUE METRIX METER) DEVI by Does not apply route.     diclofenac Sodium (VOLTAREN) 1 % GEL Voltaren 1 % topical gel  APPLY 2 GRAMS TO THE AFFECTED AREA(S) BY TOPICAL ROUTE 4 TIMES PER DAY     docusate sodium (COLACE) 100 MG capsule Take 100 mg by mouth daily as needed for mild constipation. Reported on 03/29/2015     fluticasone (FLONASE) 50 MCG/ACT nasal spray Place 2 sprays into both nostrils daily. 16 g 6   furosemide (LASIX) 20 MG tablet TAKE 1 TABLET EVERY DAY AS NEEDED 90 tablet 0   glucose blood (TRUE METRIX BLOOD GLUCOSE TEST) test strip 1 each by Other route 4 (four) times daily. 400  each 1   insulin NPH-regular Human (NOVOLIN 70/30) (70-30) 100 UNIT/ML injection Inject 55 Units into the skin daily with breakfast. And syringes 1/day. 60 mL 11   losartan (COZAAR) 50 MG tablet TAKE 1 TABLET EVERY DAY 90 tablet 1   metoprolol succinate (TOPROL-XL) 50 MG 24 hr tablet Take 1 tablet (50 mg total) by mouth daily. Take with or immediately following a meal. 90 tablet 3   naproxen (NAPROSYN) 500 MG tablet Take 500 mg by mouth daily as needed.     naproxen sodium (ANAPROX) 220 MG tablet Take 220 mg by mouth as needed (for pain).     Omega-3 Fatty Acids (FISH OIL PO) Take 2,400 mg by mouth daily.     omeprazole (PRILOSEC) 20 MG capsule TAKE 1 CAPSULE EVERY DAY 90 capsule 1   triamcinolone cream (KENALOG) 0.1 % Apply 1 application topically 4 (four) times daily. As needed for itching 80 g 2   TRUEPLUS LANCETS 33G MISC TEST BLOOD SUGAR TWICE DAILY 200 each 2   No current facility-administered medications on file prior to visit.    No Known Allergies  Family History  Problem Relation Age of Onset   Arthritis Mother    Cancer Mother        Ovarian   Diabetes Mother    Heart attack Father    Heart disease Father    Hypertension Father    Cancer Brother    Diabetes Brother     BP (!) 156/64 (BP Location: Left Arm, Patient Position: Sitting, Cuff Size: Large)   Pulse 86   Ht 5\' 8"  (1.727 m)   Wt (!) 311 lb 9.6 oz (141.3 kg)   SpO2 99%   BMI 47.38 kg/m    Review of Systems     Objective:   Physical Exam Pulses: dorsalis pedis absent bilat (poss due to edema) MSK: no deformity of the feet CV: 2+ bilat leg edema Skin:  no ulcer on the feet.  normal color and temp on the feet.  Neuro: sensation is intact to touch on the feet, but decreased from normal.   Ext: there is bilateral onychomycosis of the toenails.    Lab Results  Component Value Date   HGBA1C 9.7 (A) 10/26/2020      Assessment & Plan:  Insulin-requiring type 2 DM: uncontrolled.   Hypoglycemia, due to  insulin: we nee more info in order to safely increase insulin  Patient Instructions  Please continue the same insulin for now I have sent a prescription to your pharmacy, for continuous glucose monitor sensors.     On this type of insulin schedule, you  should eat meals on a regular schedule (especially lunch).  If a meal is missed or significantly delayed, your blood sugar could go low.    check your blood sugar twice a day.  vary the time of day when you check, between before the 3 meals, and at bedtime.  also check if you have symptoms of your blood sugar being too high or too low.  please keep a record of the readings and bring it to your next appointment here (or you can bring the meter itself).  You can write it on any piece of paper.  please call us sooner if your blood sugar goes below 70, or if you have a lot of readings over 200.   Please come back for a follow-up appointment in 3 months.

## 2020-10-26 NOTE — Patient Instructions (Addendum)
Please continue the same insulin for now I have sent a prescription to your pharmacy, for continuous glucose monitor sensors.     On this type of insulin schedule, you should eat meals on a regular schedule (especially lunch).  If a meal is missed or significantly delayed, your blood sugar could go low.    check your blood sugar twice a day.  vary the time of day when you check, between before the 3 meals, and at bedtime.  also check if you have symptoms of your blood sugar being too high or too low.  please keep a record of the readings and bring it to your next appointment here (or you can bring the meter itself).  You can write it on any piece of paper.  please call us sooner if your blood sugar goes below 70, or if you have a lot of readings over 200.   Please come back for a follow-up appointment in 3 months.

## 2020-10-27 ENCOUNTER — Other Ambulatory Visit: Payer: Self-pay | Admitting: Physician Assistant

## 2020-10-27 DIAGNOSIS — I10 Essential (primary) hypertension: Secondary | ICD-10-CM

## 2020-10-28 DIAGNOSIS — E119 Type 2 diabetes mellitus without complications: Secondary | ICD-10-CM | POA: Insufficient documentation

## 2020-11-06 DIAGNOSIS — H43392 Other vitreous opacities, left eye: Secondary | ICD-10-CM | POA: Diagnosis not present

## 2020-11-06 DIAGNOSIS — H43813 Vitreous degeneration, bilateral: Secondary | ICD-10-CM | POA: Diagnosis not present

## 2020-11-06 DIAGNOSIS — H43812 Vitreous degeneration, left eye: Secondary | ICD-10-CM | POA: Diagnosis not present

## 2020-11-06 DIAGNOSIS — H18413 Arcus senilis, bilateral: Secondary | ICD-10-CM | POA: Diagnosis not present

## 2020-11-06 DIAGNOSIS — H02834 Dermatochalasis of left upper eyelid: Secondary | ICD-10-CM | POA: Diagnosis not present

## 2020-11-15 ENCOUNTER — Ambulatory Visit (INDEPENDENT_AMBULATORY_CARE_PROVIDER_SITE_OTHER): Payer: Medicare HMO

## 2020-11-15 ENCOUNTER — Other Ambulatory Visit: Payer: Self-pay

## 2020-11-15 DIAGNOSIS — R0602 Shortness of breath: Secondary | ICD-10-CM | POA: Diagnosis not present

## 2020-11-15 LAB — ECHOCARDIOGRAM COMPLETE
AR max vel: 1.95 cm2
AV Area VTI: 2.01 cm2
AV Area mean vel: 2.14 cm2
AV Mean grad: 7 mmHg
AV Peak grad: 13.2 mmHg
Ao pk vel: 1.82 m/s
Area-P 1/2: 4.89 cm2
Calc EF: 68.9 %
S' Lateral: 3.2 cm
Single Plane A2C EF: 65.8 %
Single Plane A4C EF: 67.9 %

## 2020-11-17 ENCOUNTER — Other Ambulatory Visit: Payer: Self-pay | Admitting: Physician Assistant

## 2020-11-20 DIAGNOSIS — G4733 Obstructive sleep apnea (adult) (pediatric): Secondary | ICD-10-CM | POA: Diagnosis not present

## 2020-11-23 ENCOUNTER — Other Ambulatory Visit: Payer: Self-pay

## 2020-11-23 ENCOUNTER — Encounter: Payer: Self-pay | Admitting: Physician Assistant

## 2020-11-23 ENCOUNTER — Ambulatory Visit: Payer: Medicare HMO | Admitting: Physician Assistant

## 2020-11-23 ENCOUNTER — Ambulatory Visit (INDEPENDENT_AMBULATORY_CARE_PROVIDER_SITE_OTHER): Payer: Medicare HMO | Admitting: Physician Assistant

## 2020-11-23 DIAGNOSIS — E1151 Type 2 diabetes mellitus with diabetic peripheral angiopathy without gangrene: Secondary | ICD-10-CM | POA: Diagnosis not present

## 2020-11-23 DIAGNOSIS — G4733 Obstructive sleep apnea (adult) (pediatric): Secondary | ICD-10-CM

## 2020-11-23 DIAGNOSIS — R239 Unspecified skin changes: Secondary | ICD-10-CM

## 2020-11-23 DIAGNOSIS — N1831 Chronic kidney disease, stage 3a: Secondary | ICD-10-CM | POA: Diagnosis not present

## 2020-11-23 DIAGNOSIS — I890 Lymphedema, not elsewhere classified: Secondary | ICD-10-CM

## 2020-11-23 DIAGNOSIS — I10 Essential (primary) hypertension: Secondary | ICD-10-CM | POA: Diagnosis not present

## 2020-11-23 NOTE — Progress Notes (Signed)
[facility] [address] [place], [place] [address]  Internal MEDICINE  Office Visit Note  Patient Name: Bethany [name]  [id number]  [id number]  Date of Service: 11/27/2020  Chief Complaint  Patient presents with   Follow-up   Hypertension   Hyperlipidemia    HPI Pt is here for routine follow up -She is not tolerating CPAP, she turns over at night and pressure of band of headgear is making her have headaches. Has stopped wearing it. States she tried pillows but air will still leak once she goes to move. Tresa Endo had left her a message but she hasnt called her back.  States she spoke with someone else who sent a letter about compliance. Advised to try alternative mask/headgear and speak with Kelly--she will f/u and call her. -Established with cardiology and goes back  in December -Nephrology did not make any changes -Saw endocrinology for diabetes management and reports she has had some lows and they are working on adjusting medications -some congestion-tylenol for cold, then changed to one for high bp -Laser surgery on Left eye a few weeks ago and is recovering well. -Spot on leg is still weeping and pt has a lot of LE edema. LE [place] was previously normal, will refer to wound clinic for further eval and management -Did not take BP meds the past 3 days bc out of town and meds not packed, will take them once she gets home  Current Medication: Outpatient Encounter Medications as of 11/23/2020  Medication Sig   Alcohol Swabs (B-D SINGLE USE SWABS REGULAR) PADS USE TWICE DAILY   amLODipine (NORVASC) 10 MG tablet TAKE 1 TABLET (10 MG TOTAL) BY MOUTH DAILY.   aspirin 81 MG tablet Take 81 mg by mouth daily. Reported on 03/29/2015   atorvastatin (LIPITOR) 20 MG tablet TAKE 1 TABLET EVERY DAY   Blood Glucose Monitoring Suppl (TRUE METRIX METER) DEVI by Does not apply route.   Continuous Blood Gluc Sensor (FREESTYLE LIBRE 2 SENSOR) MISC 1 Device by Does not apply route every 14  (fourteen) days.   diclofenac Sodium (VOLTAREN) 1 % GEL Voltaren 1 % topical gel  APPLY 2 GRAMS TO THE AFFECTED AREA(S) BY TOPICAL ROUTE 4 TIMES PER DAY   docusate sodium (COLACE) 100 MG capsule Take 100 mg by mouth daily as needed for mild constipation. Reported on 03/29/2015   fluticasone (FLONASE) 50 MCG/ACT nasal spray Place 2 sprays into both nostrils daily.   furosemide (LASIX) 20 MG tablet TAKE 1 TABLET EVERY DAY AS NEEDED   glucose blood (TRUE METRIX BLOOD GLUCOSE TEST) test strip 1 each by Other route 4 (four) times daily.   insulin NPH-regular Human (NOVOLIN 70/30) (70-30) 100 UNIT/ML injection Inject 55 Units into the skin daily with breakfast. And syringes 1/day.   losartan (COZAAR) 50 MG tablet TAKE 1 TABLET EVERY DAY   metoprolol succinate (TOPROL-XL) 50 MG 24 hr tablet Take 1 tablet (50 mg total) by mouth daily. Take with or immediately following a meal.   naproxen (NAPROSYN) 500 MG tablet Take 500 mg by mouth daily as needed.   naproxen sodium (ANAPROX) 220 MG tablet Take 220 mg by mouth as needed (for pain).   Omega-3 Fatty Acids (FISH OIL PO) Take 2,400 mg by mouth daily.   omeprazole (PRILOSEC) 20 MG capsule TAKE 1 CAPSULE EVERY DAY   triamcinolone cream (KENALOG) 0.1 % APPLY TOPICALLY 4 (FOUR) TIMES DAILY AS NEEDED FOR ITCHING   TRUEPLUS LANCETS 33G MISC TEST BLOOD SUGAR TWICE DAILY  No facility-administered encounter medications on file as of 11/23/2020.    Surgical History: Past Surgical History:  Procedure Laterality Date   ABDOMINAL HYSTERECTOMY  1997   CATARACT EXTRACTION Bilateral 2014   CHOLECYSTECTOMY  2010   COLONOSCOPY     COLONOSCOPY WITH PROPOFOL N/A 04/21/2015   Procedure: COLONOSCOPY WITH PROPOFOL;  Surgeon: Hulen Luster, MD;  Location: [facility] ENDOSCOPY;  Service: Gastroenterology;  Laterality: N/A;   EYE SURGERY Bilateral    Cataract   LUMBAR WOUND DEBRIDEMENT N/A 10/20/2014   Procedure: LUMBAR WOUND DEBRIDEMENT;  Surgeon: Eustace Moore, MD;  Location: [place]  NEURO ORS;  Service: Neurosurgery;  Laterality: N/A;   MAXIMUM ACCESS (MAS)POSTERIOR LUMBAR INTERBODY FUSION (PLIF) 1 LEVEL N/A 09/16/2014   Procedure: L/4-5 FOR MAXIMUM ACCESS (MAS) POSTERIOR LUMBAR INTERBODY FUSION (PLIF) 1 LEVEL;  Surgeon: Eustace Moore, MD;  Location: [place] NEURO ORS;  Service: Neurosurgery;  Laterality: N/A;  L/4-5 FOR MAXIMUM ACCESS (MAS) POSTERIOR LUMBAR INTERBODY FUSION (PLIF) 1 LEVEL    Medical History: Past Medical History:  Diagnosis Date   Arthritis    Chronic kidney disease    Diabetes mellitus without complication (HCC)    Type II   Fibromyalgia    GERD (gastroesophageal reflux disease)    better after gall bladder surgery-    Hyperlipidemia    Hypertension    Shortness of breath dyspnea    with exertion    Family History: Family History  Problem Relation Age of Onset   Arthritis Mother    Cancer Mother        Ovarian   Diabetes Mother    Heart attack Father    Heart disease Father    Hypertension Father    Cancer Brother    Diabetes Brother     Social History   Socioeconomic History   Marital status: Widowed    Spouse name: Not on file   Number of children: Not on file   Years of education: Not on file   Highest education level: Not on file  Occupational History   Not on file  Tobacco Use   Smoking status: Never   Smokeless tobacco: Never  Vaping Use   Vaping Use: Never used  Substance and Sexual Activity   Alcohol use: No   Drug use: No   Sexual activity: Not Currently  Other Topics Concern   Not on file  Social History Narrative   Not on file   Social Determinants of Health   Financial Resource Strain: Not on file  Food Insecurity: Not on file  Transportation Needs: Not on file  Physical Activity: Not on file  Stress: Not on file  Social Connections: Not on file  Intimate Partner Violence: Not on file      Review of Systems  Constitutional:  Negative for chills, fatigue and unexpected weight change.  HENT:   Positive for postnasal drip. Negative for congestion, rhinorrhea, sneezing and sore throat.   Eyes:  Negative for redness.  Respiratory:  Negative for cough, chest tightness and shortness of breath.   Cardiovascular:  Positive for leg swelling. Negative for chest pain and palpitations.  Gastrointestinal:  Negative for abdominal pain, constipation, diarrhea, nausea and vomiting.  Genitourinary:  Negative for dysuria and frequency.  Musculoskeletal:  Negative for arthralgias, back pain, joint swelling and neck pain.  Skin:  Positive for color change. Negative for rash.  Neurological: Negative.  Negative for tremors and numbness.  Hematological:  Negative for adenopathy. Does not bruise/bleed easily.  Psychiatric/Behavioral:  Negative for behavioral problems (Depression), sleep disturbance and suicidal ideas. The patient is not nervous/anxious.    Vital Signs: BP (!) 150/80 Comment: 170/85  Pulse 87   Temp 98 F (36.7 C)   Resp 16   Ht 5' 7.5" (1.715 m)   Wt 300 lb (136.1 kg)   SpO2 97%   BMI 46.29 kg/m    Physical Exam Vitals and nursing note reviewed.  Constitutional:      General: She is not in acute distress.    Appearance: She is well-developed. She is obese. She is not diaphoretic.  HENT:     Head: Normocephalic and atraumatic.     Mouth/Throat:     Pharynx: No oropharyngeal exudate.  Eyes:     Pupils: Pupils are equal, round, and reactive to light.  Neck:     Thyroid: No thyromegaly.     Vascular: No JVD.     Trachea: No tracheal deviation.  Cardiovascular:     Rate and Rhythm: Normal rate and regular rhythm.     Heart sounds: Normal heart sounds. No murmur heard.   No friction rub. No gallop.  Pulmonary:     Effort: Pulmonary effort is normal. No respiratory distress.     Breath sounds: No wheezing or rales.  Chest:     Chest wall: No tenderness.  Abdominal:     General: Bowel sounds are normal.     Palpations: Abdomen is soft.  Musculoskeletal:         General: Normal range of motion.     Cervical back: Normal range of motion and neck supple.     Right lower leg: Edema present.     Left lower leg: Edema present.  Lymphadenopathy:     Cervical: No cervical adenopathy.  Skin:    General: Skin is warm and dry.     Findings: Lesion present.     Comments: Lighter spot on left shin with some weeping  Neurological:     Mental Status: She is alert and oriented to person, place, and time.     Cranial Nerves: No cranial nerve deficit.  Psychiatric:        Behavior: Behavior normal.        Thought Content: Thought content normal.        Judgment: Judgment normal.       Assessment/Plan: 1. Benign hypertension Elevated in office today but patient has not had medication in past 3 days.  Take medication now continue to monitor.  Discussed the importance of medication adherence  2. Lymphedema of both lower extremities Will refer over the wound clinic for further evaluation and management of chronic lymphedema of lower extremities as well as some skin changes along her lower extremities - Ambulatory referral to Wound Clinic  3. Unspecified skin changes Will refer over the wound clinic for further evaluation and management of chronic lymphedema of lower extremities as well as some skin changes along her lower extremities - Ambulatory referral to Wound Clinic  4. OSA (obstructive sleep apnea) Patient stopped using CPAP due to intolerance with it.  Advised to contact [place] to work through mask problems and to retry CPAP.  Patient is aware of the consequences of going untreated for OSA  5. Stage 3a chronic kidney disease ([place]) Followed by nephrology  6. Diabetes mellitus with peripheral vascular disease ([place]) Followed by endocrinology   General Counseling: Theresia verbalizes understanding of the findings of todays visit and agrees with plan of treatment. I have discussed any  further diagnostic evaluation that may be needed or ordered today. We  also reviewed her medications today. she has been encouraged to call the office with any questions or concerns that should arise related to todays visit.    Orders Placed This Encounter  Procedures   Ambulatory referral to Wound Clinic    No orders of the defined types were placed in this encounter.   This patient was seen by Drema Dallas, PA-C in collaboration with Dr. Clayborn Bigness as a part of collaborative care agreement.   Total time spent:40 Minutes Time spent includes review of chart, medications, test results, and follow up plan with the patient.      Dr Lavera Guise Internal medicine

## 2020-12-12 ENCOUNTER — Other Ambulatory Visit: Payer: Self-pay

## 2020-12-12 ENCOUNTER — Encounter: Payer: Medicare HMO | Attending: Physician Assistant | Admitting: Physician Assistant

## 2020-12-12 DIAGNOSIS — I5042 Chronic combined systolic (congestive) and diastolic (congestive) heart failure: Secondary | ICD-10-CM | POA: Insufficient documentation

## 2020-12-12 DIAGNOSIS — I890 Lymphedema, not elsewhere classified: Secondary | ICD-10-CM | POA: Insufficient documentation

## 2020-12-12 DIAGNOSIS — N189 Chronic kidney disease, unspecified: Secondary | ICD-10-CM | POA: Insufficient documentation

## 2020-12-12 DIAGNOSIS — L97829 Non-pressure chronic ulcer of other part of left lower leg with unspecified severity: Secondary | ICD-10-CM | POA: Diagnosis not present

## 2020-12-12 DIAGNOSIS — I10 Essential (primary) hypertension: Secondary | ICD-10-CM | POA: Diagnosis not present

## 2020-12-12 DIAGNOSIS — I130 Hypertensive heart and chronic kidney disease with heart failure and stage 1 through stage 4 chronic kidney disease, or unspecified chronic kidney disease: Secondary | ICD-10-CM | POA: Diagnosis not present

## 2020-12-12 DIAGNOSIS — E1122 Type 2 diabetes mellitus with diabetic chronic kidney disease: Secondary | ICD-10-CM | POA: Insufficient documentation

## 2020-12-12 DIAGNOSIS — L97819 Non-pressure chronic ulcer of other part of right lower leg with unspecified severity: Secondary | ICD-10-CM | POA: Diagnosis not present

## 2020-12-13 NOTE — Progress Notes (Signed)
SIMRANJIT, THAYER ([id number]) Visit Report for 12/12/2020 Allergy List Details Patient Name: Bethany [name], Bethany [name] Date of Service: 12/12/2020 8:45 AM Medical Record Number: [id number] Patient Account Number: [id number] Date of Birth/Sex: 06-Oct-1945 (75 y.o. F) Treating RN: Yevonne Pax Primary Care Mustaf Antonacci: Lynn Ito Other Clinician: Referring Robbie Rideaux: Beverely Risen Treating Timmy Bubeck/Extender: Allen Derry Weeks in Treatment: 0 Allergies Active Allergies No Known Allergies Allergy Notes Electronic Signature(s) Signed: 12/13/2020 2:51:09 PM By: Yevonne Pax RN Entered By: Yevonne Pax on 12/12/2020 09:21:06 Bethany [name] ([id number]) -------------------------------------------------------------------------------- Arrival Information Details Patient Name: Bethany [name] Date of Service: 12/12/2020 8:45 AM Medical Record Number: [id number] Patient Account Number: [id number] Date of Birth/Sex: [birth year] (75 y.o. F) Treating RN: Yevonne Pax Primary Care Cadarius Nevares: Lynn Ito Other Clinician: Referring Azaan Leask: Beverely Risen Treating Tjay Velazquez/Extender: Rowan Blase in Treatment: 0 Visit Information Patient Arrived: Ambulatory Arrival Time: 09:10 Accompanied By: self Transfer Assistance: None Patient Identification Verified: Yes Secondary Verification Process Completed: Yes Patient Requires Transmission-Based Precautions: No Patient Has Alerts: Yes Patient Alerts: ABI R 1.36 07/20/20 ABI L 1.09 07/20/20 Electronic Signature(s) Signed: 12/13/2020 2:51:09 PM By: Yevonne Pax RN Entered By: Yevonne Pax on 12/12/2020 09:31:30 Bethany [name] ([id number]) -------------------------------------------------------------------------------- Clinic Level of Care Assessment Details Patient Name: Bethany [name] Date of Service: 12/12/2020 8:45 AM Medical Record Number: [id number] Patient Account Number: [id number] Date of Birth/Sex: [birth year] (75 y.o. F) Treating RN: Yevonne Pax Primary Care Severina Sykora: Lynn Ito Other Clinician: Referring Ura Hausen: Beverely Risen Treating Kalin Kyler/Extender: Rowan Blase in Treatment: 0 Clinic Level of Care Assessment Items TOOL 2 Quantity Score X - Use when only an EandM is performed on the INITIAL visit 1 0 ASSESSMENTS - Nursing Assessment / Reassessment X - General Physical Exam (combine w/ comprehensive assessment (listed just below) when performed on new 1 20 pt. evals) X- 1 25 Comprehensive Assessment (HX, ROS, Risk Assessments, Wounds Hx, etc.) ASSESSMENTS - Wound and Skin Assessment / Reassessment []  - Simple Wound Assessment / Reassessment - one wound 0 []  - 0 Complex Wound Assessment / Reassessment - multiple wounds []  - 0 Dermatologic / Skin Assessment (not related to wound area) ASSESSMENTS - Ostomy and/or Continence Assessment and Care []  - Incontinence Assessment and Management 0 []  - 0 Ostomy Care Assessment and Management (repouching, etc.) PROCESS - Coordination of Care []  - Simple Patient / Family Education for ongoing care 0 []  - 0 Complex (extensive) Patient / Family Education for ongoing care X- 1 10 Staff obtains , Records, Test Results / Process Orders []  - 0 Staff telephones HHA, Nursing Homes / Clarify orders / etc []  - 0 Routine Transfer to another Facility (non-emergent condition) []  - 0 Routine Hospital Admission (non-emergent condition) X- 1 15 New Admissions / / Ordering NPWT, Apligraf, etc. []  - 0 Emergency Hospital Admission (emergent condition) X- 1 10 Simple Discharge Coordination []  - 0 Complex (extensive) Discharge Coordination PROCESS - Special Needs []  - Pediatric / Minor Patient Management 0 []  - 0 Isolation Patient Management []  - 0 Hearing / Language / Visual special needs []  - 0 Assessment of Community assistance (transportation, D/C planning, etc.) []  - 0 Additional assistance / Altered mentation []  - 0 Support  Surface(s) Assessment (bed, cushion, seat, etc.) INTERVENTIONS - Wound Cleansing / Measurement []  - Wound Imaging (photographs - any number of wounds) 0 []  - 0 Wound Tracing (instead of photographs) []  - 0 Simple Wound Measurement - one wound []  - 0 Complex Wound Measurement - multiple wounds Bethany [name], Bethany  E. ([id number]) []  - 0 Simple Wound Cleansing - one wound []  - 0 Complex Wound Cleansing - multiple wounds INTERVENTIONS - Wound Dressings []  - Small Wound Dressing one or multiple wounds 0 []  - 0 Medium Wound Dressing one or multiple wounds []  - 0 Large Wound Dressing one or multiple wounds []  - 0 Application of Medications - injection INTERVENTIONS - Miscellaneous []  - External ear exam 0 []  - 0 Specimen Collection (cultures, biopsies, blood, body fluids, etc.) []  - 0 Specimen(s) / Culture(s) sent or taken to Lab for analysis []  - 0 Patient Transfer (multiple staff / / Similar devices) []  - 0 Simple Staple / Suture removal (25 or less) []  - 0 Complex Staple / Suture removal (26 or more) []  - 0 Hypo / Hyperglycemic Management (close monitor of Blood Glucose) []  - 0 Ankle / Brachial Index (ABI) - do not check if billed separately Has the patient been seen at the hospital within the last three years: Yes Total Score: 80 Level Of Care: New/Established - Level 3 Electronic Signature(s) Signed: 12/13/2020 2:51:09 PM By: RN Entered By: on 12/12/2020 10:09:28 ( ) -------------------------------------------------------------------------------- Encounter Discharge Information Details Patient Name: Date of Service: 12/12/2020 8:45 AM Medical Record Number: Patient Account Number: Nurse, adult Date of Birth/Sex: 09-02-1945 (75 y.o. F) Treating RN: Primary Care Judith Campillo: Other Clinician: Referring Faithe Ariola: 12/15/2020 Treating Zamiyah Resendes/Extender: Yevonne Pax in Treatment: 0 Encounter Discharge Information Items Discharge Condition: Stable Ambulatory Status: Ambulatory Discharge Destination: Home Transportation: Private Auto Accompanied By: self Schedule Follow-up Appointment: Yes Clinical Summary of Care: Patient Declined Electronic Signature(s) Signed: 12/13/2020 2:51:09 PM By: 12/14/2020 RN Entered By: Bethany [name] on 12/12/2020 09:57:41 Bethany [name] (12/14/2020) -------------------------------------------------------------------------------- Lower Extremity Assessment Details Patient Name: [id number] Date of Service: 12/12/2020 8:45 AM Medical Record Number: 03/05/1945 Patient Account Number: [id number] Date of Birth/Sex: 1945/09/21 (75 y.o. F) Treating RN: Beverely Risen Primary Care Arjay Jaskiewicz: Rowan Blase Other Clinician: Referring Bellamy Rubey: 12/15/2020 Treating Genea Rheaume/Extender: Yevonne Pax Weeks in Treatment: 0 Edema Assessment Assessed: [Left: No] [Right: No] Edema: [Left: Ye] [Right: s] Calf Left: Right: Point of Measurement: 35 cm From Medial Instep 46 cm Ankle Left: Right: Point of Measurement: 10 cm From Medial Instep 27 cm Knee To Floor Left: Right: From Medial Instep 49 cm Vascular Assessment Pulses: Dorsalis Pedis Palpable: [Left:Yes] Electronic Signature(s) Signed: 12/13/2020 2:51:09 PM By: 12/14/2020 RN Entered By: Bethany [name] on 12/12/2020 09:32:53 Bethany [name] (12/14/2020) -------------------------------------------------------------------------------- Multi Wound Chart Details Patient Name: [id number] Date of Service: 12/12/2020 8:45 AM Medical Record Number: 03/05/1945 Patient Account Number: [id number] Date of Birth/Sex: [birth year] (75 y.o. F) Treating RN: Beverely Risen Primary Care Jarone Ostergaard: Allen Derry Other Clinician: Referring Johnothan Bascomb: 12/15/2020 Treating Shamira Toutant/Extender: Yevonne Pax in Treatment: 0 Vital Signs Height(in): 67 Pulse(bpm):  83 Weight(lbs): 300 Blood Pressure(mmHg): 188/80 Body Mass Index(BMI): 47 Temperature(F): 98.2 Respiratory Rate(breaths/min): 18 Wound Assessments Treatment Notes Electronic Signature(s) Signed: 12/13/2020 2:51:09 PM By: 12/14/2020 RN Entered By: Bethany [name] on 12/12/2020 09:55:15 Bethany [name] (12/14/2020) -------------------------------------------------------------------------------- Multi-Disciplinary Care Plan Details Patient Name: [id number] Date of Service: 12/12/2020 8:45 AM Medical Record Number: 03/05/1945 Patient Account Number: [id number] Date of Birth/Sex: January 29, 1945 (75 y.o. F) Treating RN: Beverely Risen Primary Care Livie Vanderhoof: Rowan Blase Other Clinician: Referring Eastin Swing: 12/15/2020 Treating Kelsee Preslar/Extender: Yevonne Pax in Treatment: 0 Active Inactive Electronic Signature(s) Signed: 12/12/2020 10:15:42 AM By:  Yevonne Pax RN Entered By: Yevonne Pax on 12/12/2020 10:15:42 Bethany [name] ([id number]) -------------------------------------------------------------------------------- Pain Assessment Details Patient Name: Bethany [name], Bethany [name] Date of Service: 12/12/2020 8:45 AM Medical Record Number: [id number] Patient Account Number: [id number] Date of Birth/Sex: 12-05-1945 (75 y.o. F) Treating RN: Yevonne Pax Primary Care Vanity Larsson: Lynn Ito Other Clinician: Referring Hydie Langan: Beverely Risen Treating Natale Thoma/Extender: Rowan Blase in Treatment: 0 Active Problems Location of Pain Severity and Description of Pain Patient Has Paino No Site Locations Pain Management and Medication Current Pain Management: Electronic Signature(s) Signed: 12/13/2020 2:51:09 PM By: Yevonne Pax RN Entered By: Yevonne Pax on 12/12/2020 09:17:40 Bethany [name] ([id number]) -------------------------------------------------------------------------------- Patient/Caregiver Education Details Patient Name: Bethany [name] Date of Service:  12/12/2020 8:45 AM Medical Record Number: [id number] Patient Account Number: [id number] Date of Birth/Gender: 30-Dec-1945 (75 y.o. F) Treating RN: Yevonne Pax Primary Care Physician: Lynn Ito Other Clinician: Referring Physician: Beverely Risen Treating Physician/Extender: Rowan Blase in Treatment: 0 Education Assessment Education Provided To: Patient Education Topics Provided Wound/Skin Impairment: Methods: Explain/Verbal Responses: State content correctly Electronic Signature(s) Signed: 12/13/2020 2:51:09 PM By: Yevonne Pax RN Entered By: Yevonne Pax on 12/12/2020 09:57:04 Bethany [name] ([id number]) -------------------------------------------------------------------------------- Vitals Details Patient Name: Bethany [name] Date of Service: 12/12/2020 8:45 AM Medical Record Number: [id number] Patient Account Number: [id number] Date of Birth/Sex: 16-Feb-1945 (75 y.o. F) Treating RN: Yevonne Pax Primary Care Samanyu Tinnell: Lynn Ito Other Clinician: Referring Latania Bascomb: Beverely Risen Treating Reshma Hoey/Extender: Rowan Blase in Treatment: 0 Vital Signs Time Taken: 09:17 Temperature (F): 98.2 Height (in): 67 Pulse (bpm): 83 Source: Stated Respiratory Rate (breaths/min): 18 Weight (lbs): 300 Blood Pressure (mmHg): 188/80 Source: Measured Reference Range: 80 - 120 mg / dl Body Mass Index (BMI): 47 Electronic Signature(s) Signed: 12/13/2020 2:51:09 PM By: Yevonne Pax RN Entered By: Yevonne Pax on 12/12/2020 09:19:51

## 2020-12-13 NOTE — Progress Notes (Signed)
Bethany [name], Bethany [name] ([id number]) Visit Report for 12/12/2020 Chief Complaint Document Details Patient Name: AFNAN, MANCINELLI Date of Service: 12/12/2020 8:45 AM Medical Record Number: [id number] Patient Account Number: [id number] Date of Birth/Sex: 1945/10/14 (75 y.o. F) Treating RN: Carlene Coria Primary Care Provider: Drema Dallas Other Clinician: Referring Provider: Clayborn Bigness Treating Provider/Extender: Skipper Cliche in Treatment: 0 Information Obtained from: Patient Chief Complaint Bilateral LE Lymphedema Electronic Signature(s) Signed: 12/12/2020 9:51:38 AM By: Worthy Keeler PA-C Entered By: Worthy Keeler on 12/12/2020 09:51:38 Bethany [name] ([id number]) -------------------------------------------------------------------------------- HPI Details Patient Name: Bethany [name] Date of Service: 12/12/2020 8:45 AM Medical Record Number: [id number] Patient Account Number: [id number] Date of Birth/Sex: [birth year] (75 y.o. F) Treating RN: Carlene Coria Primary Care Provider: Drema Dallas Other Clinician: Referring Provider: Clayborn Bigness Treating Provider/Extender: Skipper Cliche in Treatment: 0 History of Present Illness HPI Description: 12/12/2020 upon evaluation today patient presents for initial inspection here in the clinic concerning issues she has been having with the bilateral lower extremities what appears to be lymphedema. She has had lymphedema for quite a few years. In fact she tells me as many as even 10. With that being said she currently is not having any issues with drainage although she tells me there is an area especially in the front of the left leg which often will drain. She has had formal arterial studies on 07/20/2020. This showed that she had a right ABI 1.36 in the left ABI of 1.09 overall this is quite good in my opinion and overall seems to be doing well she had triphasic blood flow throughout which is also excellent. In general I think her  issue seems to be more venous in nature not arterial at all. Patient does have a history of hypertension, congestive heart failure, and chronic kidney disease although I do not know the stage. Electronic Signature(s) Signed: 12/12/2020 4:47:40 PM By: Worthy Keeler PA-C Entered By: Worthy Keeler on 12/12/2020 16:47:40 Bethany [name] ([id number]) -------------------------------------------------------------------------------- Physical Exam Details Patient Name: Bethany [name] Date of Service: 12/12/2020 8:45 AM Medical Record Number: [id number] Patient Account Number: [id number] Date of Birth/Sex: [birth year] (75 y.o. F) Treating RN: Carlene Coria Primary Care Provider: Drema Dallas Other Clinician: Referring Provider: Clayborn Bigness Treating Provider/Extender: Skipper Cliche in Treatment: 0 Constitutional patient is hypertensive.. pulse regular and within target range for patient.Marland Kitchen respirations regular, non-labored and within target range for patient.Marland Kitchen temperature within target range for patient.. Obese and well-hydrated in no acute distress. Eyes conjunctiva clear no eyelid edema noted. pupils equal round and reactive to light and accommodation. Ears, Nose, Mouth, and Throat no gross abnormality of ear auricles or external auditory canals. normal hearing noted during conversation. mucus membranes moist. Respiratory normal breathing without difficulty. Cardiovascular 2+ dorsalis pedis/posterior tibialis pulses. Patient has bilateral stage III lymphedema. Musculoskeletal normal gait and posture. no significant deformity or arthritic changes, no loss or range of motion, no clubbing. Psychiatric this patient is able to make decisions and demonstrates good insight into disease process. Alert and Oriented x 3. pleasant and cooperative. Notes Upon inspection patient had no open wounds which is great news. With that being said she unfortunately is having ongoing issues  intermittently with edema and swelling and subsequently what appears to be lymphedema currently. I do think that she could always be referred to lymphedema clinic if this continues to be a significant issue. With that being said right now I think that the biggest thing may be trying some initial  steps to get her edema under better control and we discussed that today in the office. She is already on a fluid pill so that is good news already. Electronic Signature(s) Signed: 12/12/2020 4:48:40 PM By: Worthy Keeler PA-C Entered By: Worthy Keeler on 12/12/2020 16:48:39 Bethany [name], Bethany [name] ([id number]) -------------------------------------------------------------------------------- Physician Orders Details Patient Name: Bethany [name] Date of Service: 12/12/2020 8:45 AM Medical Record Number: [id number] Patient Account Number: [id number] Date of Birth/Sex: 1945-11-03 (75 y.o. F) Treating RN: Carlene Coria Primary Care Provider: Drema Dallas Other Clinician: Referring Provider: Clayborn Bigness Treating Provider/Extender: Skipper Cliche in Treatment: 0 Verbal / Phone Orders: No Diagnosis Coding ICD-10 Coding Code Description I89.0 Lymphedema, not elsewhere classified I10 Essential (primary) hypertension I50.42 Chronic combined systolic (congestive) and diastolic (congestive) heart failure N18.9 Chronic kidney disease, unspecified Discharge From [place] Only o Wear compression garments daily. Put garments on first thing when you wake up and remove them before bed. o Moisturize legs daily after removing compression garments. - apply ever evening o Elevate, Exercise Daily and Avoid Standing for Long Periods of Time. o DO YOUR BEST to sleep in the bed at night. DO NOT sleep in your recliner. Long hours of sitting in a recliner leads to swelling of the legs and/or potential wounds on your backside. Consults o Vascular - [facility] consult for venous insuff. - (ICD10  I89.0 - Lymphedema, not elsewhere classified) Electronic Signature(s) Signed: 12/12/2020 4:57:43 PM By: Worthy Keeler PA-C Signed: 12/13/2020 2:51:09 PM By: Carlene Coria RN Entered By: Carlene Coria on 12/12/2020 10:04:49 Bethany [name] ([id number]) -------------------------------------------------------------------------------- Problem List Details Patient Name: Bethany [name] Date of Service: 12/12/2020 8:45 AM Medical Record Number: [id number] Patient Account Number: [id number] Date of Birth/Sex: [birth year] (75 y.o. F) Treating RN: Carlene Coria Primary Care Provider: Drema Dallas Other Clinician: Referring Provider: Clayborn Bigness Treating Provider/Extender: Skipper Cliche in Treatment: 0 Active Problems ICD-10 Encounter Code Description Active Date MDM Diagnosis I89.0 Lymphedema, not elsewhere classified 12/12/2020 No Yes I10 Essential (primary) hypertension 12/12/2020 No Yes I50.42 Chronic combined systolic (congestive) and diastolic (congestive) heart 12/12/2020 No Yes failure N18.9 Chronic kidney disease, unspecified 12/12/2020 No Yes Inactive Problems Resolved Problems Electronic Signature(s) Signed: 12/12/2020 9:51:23 AM By: Worthy Keeler PA-C Entered By: Worthy Keeler on 12/12/2020 09:51:22 Bethany [name] ([id number]) -------------------------------------------------------------------------------- Progress Note Details Patient Name: Bethany [name] Date of Service: 12/12/2020 8:45 AM Medical Record Number: [id number] Patient Account Number: [id number] Date of Birth/Sex: 19-May-1945 (75 y.o. F) Treating RN: Carlene Coria Primary Care Provider: Drema Dallas Other Clinician: Referring Provider: Clayborn Bigness Treating Provider/Extender: Skipper Cliche in Treatment: 0 Subjective Chief Complaint Information obtained from Patient Bilateral LE Lymphedema History of Present Illness (HPI) 12/12/2020 upon evaluation today patient presents for initial  inspection here in the clinic concerning issues she has been having with the bilateral lower extremities what appears to be lymphedema. She has had lymphedema for quite a few years. In fact she tells me as many as even 10. With that being said she currently is not having any issues with drainage although she tells me there is an area especially in the front of the left leg which often will drain. She has had formal arterial studies on 07/20/2020. This showed that she had a right ABI 1.36 in the left ABI of 1.09 overall this is quite good in my opinion and overall seems to be doing well she had triphasic blood flow throughout which is also excellent. In general  I think her issue seems to be more venous in nature not arterial at all. Patient does have a history of hypertension, congestive heart failure, and chronic kidney disease although I do not know the stage. Patient History Allergies No Known Allergies Social History Never smoker, Marital Status - Widowed, Alcohol Use - Never, Drug Use - No History, Caffeine Use - Moderate. Medical History Hematologic/Lymphatic Patient has history of Lymphedema Cardiovascular Patient has history of Congestive Heart Failure, Hypertension Endocrine Patient has history of Type II Diabetes - 20 years Patient is treated with Insulin. Blood sugar is tested. Review of Systems (ROS) Immunological Denies complaints or symptoms of Hives, Itching. Integumentary (Skin) Denies complaints or symptoms of Wounds, Bleeding or bruising tendency, Breakdown, Swelling. Objective Constitutional patient is hypertensive.. pulse regular and within target range for patient.Marland Kitchen respirations regular, non-labored and within target range for patient.Marland Kitchen temperature within target range for patient.. Obese and well-hydrated in no acute distress. Vitals Time Taken: 9:17 AM, Height: 67 in, Source: Stated, Weight: 300 lbs, Source: Measured, BMI: 47, Temperature: 98.2 F, Pulse: 83  bpm, Respiratory Rate: 18 breaths/min, Blood Pressure: 188/80 mmHg. Eyes conjunctiva clear no eyelid edema noted. pupils equal round and reactive to light and accommodation. Ears, Nose, Mouth, and Throat no gross abnormality of ear auricles or external auditory canals. normal hearing noted during conversation. mucus membranes moist. Pham, Srihitha E. ([id number]) Respiratory normal breathing without difficulty. Cardiovascular 2+ dorsalis pedis/posterior tibialis pulses. Patient has bilateral stage III lymphedema. Musculoskeletal normal gait and posture. no significant deformity or arthritic changes, no loss or range of motion, no clubbing. Psychiatric this patient is able to make decisions and demonstrates good insight into disease process. Alert and Oriented x 3. pleasant and cooperative. General Notes: Upon inspection patient had no open wounds which is great news. With that being said she unfortunately is having ongoing issues intermittently with edema and swelling and subsequently what appears to be lymphedema currently. I do think that she could always be referred to lymphedema clinic if this continues to be a significant issue. With that being said right now I think that the biggest thing may be trying some initial steps to get her edema under better control and we discussed that today in the office. She is already on a fluid pill so that is good news already. Assessment Active Problems ICD-10 Lymphedema, not elsewhere classified Essential (primary) hypertension Chronic combined systolic (congestive) and diastolic (congestive) heart failure Chronic kidney disease, unspecified Plan Discharge From [facility] Services: Consult Only Wear compression garments daily. Put garments on first thing when you wake up and remove them before bed. Moisturize legs daily after removing compression garments. - apply ever evening Elevate, Exercise Daily and Avoid Standing for Long Periods of Time. DO YOUR  BEST to sleep in the bed at night. DO NOT sleep in your recliner. Long hours of sitting in a recliner leads to swelling of the legs and/or potential wounds on your backside. Consults ordered were: Vascular - [facility] consult for venous insuff. 1. Based on what I am seeing currently I think the best way for [place] to address the issues at hand is going to be for [place] to go ahead and see about helping her getting appropriate fitting compression socks. We discussed that today. 2. Also think that based on what I am seeing at this point the patient seems to be doing excellent with regard to the wound standpoint there is nothing open at this point if we keep her edema under control I think  we can keep that under control as well. 3. She on top of taking the oral medication/diuretic also needs to elevate her legs and wear the compression socks this triad of treatment option should help to hopefully keep things under good control. 4. With regard to her ongoing issues I think she would benefit herself by seeing a vascular surgeon to discuss any options with regard to her chronic venous stasis. Obviously there may or may not be anything they can do but I still think it would be a good idea for [place] to refer her there for evaluation. We will see patient back for reevaluation in 1 week here in the clinic. If anything worsens or changes patient will contact our office for additional recommendations. Electronic Signature(s) Signed: 12/12/2020 4:50:52 PM By: Lenda Kelp PA-C Entered By: Lenda Kelp on 12/12/2020 16:50:52 Bethany [name] ([id number]) -------------------------------------------------------------------------------- ROS/PFSH Details Patient Name: Bethany [name] Date of Service: 12/12/2020 8:45 AM Medical Record Number: [id number] Patient Account Number: [id number] Date of Birth/Sex: 1945/04/15 (75 y.o. F) Treating RN: Yevonne Pax Primary Care Provider: Lynn Ito Other  Clinician: Referring Provider: Beverely Risen Treating Provider/Extender: Rowan Blase in Treatment: 0 Immunological Complaints and Symptoms: Negative for: Hives; Itching Integumentary (Skin) Complaints and Symptoms: Negative for: Wounds; Bleeding or bruising tendency; Breakdown; Swelling Hematologic/Lymphatic Medical History: Positive for: Lymphedema Cardiovascular Medical History: Positive for: Congestive Heart Failure; Hypertension Endocrine Medical History: Positive for: Type II Diabetes - 20 years Treated with: Insulin Blood sugar tested every day: Yes Tested : Immunizations Pneumococcal Vaccine: Received Pneumococcal Vaccination: Yes Received Pneumococcal Vaccination On or After 60th Birthday: Yes Implantable Devices No devices added Family and Social History Never smoker; Marital Status - Widowed; Alcohol Use: Never; Drug Use: No History; Caffeine Use: Moderate; Financial Concerns: No; Food, Clothing or Shelter Needs: No; Support System Lacking: No; Transportation Concerns: No Electronic Signature(s) Signed: 12/12/2020 4:57:43 PM By: Lenda Kelp PA-C Signed: 12/13/2020 2:51:09 PM By: Yevonne Pax RN Entered By: Yevonne Pax on 12/12/2020 09:25:13 Bethany [name] ([id number]) -------------------------------------------------------------------------------- SuperBill Details Patient Name: Bethany [name] Date of Service: 12/12/2020 Medical Record Number: [id number] Patient Account Number: [id number] Date of Birth/Sex: 09-18-1945 (75 y.o. F) Treating RN: Yevonne Pax Primary Care Provider: Lynn Ito Other Clinician: Referring Provider: Beverely Risen Treating Provider/Extender: Rowan Blase in Treatment: 0 Diagnosis Coding ICD-10 Codes Code Description I89.0 Lymphedema, not elsewhere classified I10 Essential (primary) hypertension I50.42 Chronic combined systolic (congestive) and diastolic (congestive) heart failure N18.9 Chronic kidney disease,  unspecified Facility Procedures CPT4 Code: 75643329 Description: 99213 - WOUND CARE VISIT-LEV 3 EST PT Modifier: Quantity: 1 Physician Procedures CPT4 Code: 5188416 Description: 99204 - WC PHYS LEVEL 4 - NEW PT Modifier: Quantity: 1 CPT4 Code: Description: ICD-10 Diagnosis Description I89.0 Lymphedema, not elsewhere classified I10 Essential (primary) hypertension I50.42 Chronic combined systolic (congestive) and diastolic (congestive) heart N18.9 Chronic kidney disease, unspecified Modifier: failure Quantity: Electronic Signature(s) Signed: 12/12/2020 4:52:10 PM By: Lenda Kelp PA-C Entered By: Lenda Kelp on 12/12/2020 16:52:10

## 2020-12-13 NOTE — Progress Notes (Signed)
SECILIA, APPS ([id number]) Visit Report for 12/12/2020 Abuse/Suicide Risk Screen Details Patient Name: Bethany [name], Bethany [name] Date of Service: 12/12/2020 8:45 AM Medical Record Number: [id number] Patient Account Number: [id number] Date of Birth/Sex: 07-29-1945 (75 y.o. F) Treating RN: Yevonne Pax Primary Care Tymeir Weathington: Lynn Ito Other Clinician: Referring Cannon Quinton: Beverely Risen Treating Kreed Kauffman/Extender: Rowan Blase in Treatment: 0 Abuse/Suicide Risk Screen Items Answer ABUSE RISK SCREEN: Has anyone close to you tried to hurt or harm you recentlyo No Do you feel uncomfortable with anyone in your familyo No Has anyone forced you do things that you didnot want to doo No Electronic Signature(s) Signed: 12/13/2020 2:51:09 PM By: Yevonne Pax RN Entered By: Yevonne Pax on 12/12/2020 09:25:32 Bethany [name] ([id number]) -------------------------------------------------------------------------------- Activities of Daily Living Details Patient Name: Bethany [name] Date of Service: 12/12/2020 8:45 AM Medical Record Number: [id number] Patient Account Number: [id number] Date of Birth/Sex: [birth year] (75 y.o. F) Treating RN: Yevonne Pax Primary Care Cele Mote: Lynn Ito Other Clinician: Referring Velna Hedgecock: Beverely Risen Treating Jmya Uliano/Extender: Rowan Blase in Treatment: 0 Activities of Daily Living Items Answer Activities of Daily Living (Please select one for each item) Drive Automobile Completely Able Take Medications Completely Able Use Telephone Completely Able Care for Appearance Completely Able Use Toilet Completely Able Bath / Shower Completely Able Dress Self Completely Able Feed Self Completely Able Walk Completely Able Get In / Out Bed Completely Able Housework Completely Able Prepare Meals Completely Able Handle Money Completely Able Shop for Self Completely Able Electronic Signature(s) Signed: 12/13/2020 2:51:09 PM By: Yevonne Pax  RN Entered By: Yevonne Pax on 12/12/2020 09:26:05 Bethany [name] ([id number]) -------------------------------------------------------------------------------- Education Screening Details Patient Name: Bethany [name] Date of Service: 12/12/2020 8:45 AM Medical Record Number: [id number] Patient Account Number: [id number] Date of Birth/Sex: Dec 05, 1945 (75 y.o. F) Treating RN: Yevonne Pax Primary Care Yaffa Seckman: Lynn Ito Other Clinician: Referring Saree Krogh: Beverely Risen Treating Paige Vanderwoude/Extender: Rowan Blase in Treatment: 0 Learning Preferences/Education Level/Primary Language Learning Preference: Explanation Highest Education Level: College or Above Preferred Language: English Cognitive Barrier Language Barrier: No Translator Needed: No Memory Deficit: No Emotional Barrier: No Cultural/Religious Beliefs Affecting Medical Care: No Physical Barrier Impaired Vision: No Impaired Hearing: No Decreased Hand dexterity: No Knowledge/Comprehension Knowledge Level: Medium Comprehension Level: High Ability to understand written instructions: High Ability to understand verbal instructions: High Motivation Anxiety Level: Anxious Cooperation: Cooperative Education Importance: Acknowledges Need Interest in Health Problems: Asks Questions Perception: Coherent Willingness to Engage in Self-Management High Activities: Readiness to Engage in Self-Management High Activities: Electronic Signature(s) Signed: 12/13/2020 2:51:09 PM By: Yevonne Pax RN Entered By: Yevonne Pax on 12/12/2020 09:27:27 Bethany [name] ([id number]) -------------------------------------------------------------------------------- Fall Risk Assessment Details Patient Name: Bethany [name] Date of Service: 12/12/2020 8:45 AM Medical Record Number: [id number] Patient Account Number: [id number] Date of Birth/Sex: 1945-02-13 (75 y.o. F) Treating RN: Yevonne Pax Primary Care Sintia Mckissic: Lynn Ito Other Clinician: Referring Jasalyn Frysinger: Beverely Risen Treating Corianne Buccellato/Extender: Rowan Blase in Treatment: 0 Fall Risk Assessment Items Have you had 2 or more falls in the last 12 monthso 0 No Have you had any fall that resulted in injury in the last 12 monthso 0 No FALLS RISK SCREEN History of falling - immediate or within 3 months 0 No Secondary diagnosis (Do you have 2 or more medical diagnoseso) 0 No Ambulatory aid None/bed rest/wheelchair/nurse 0 No Crutches/cane/walker 0 No Furniture 0 No Intravenous therapy Access/Saline/Heparin Lock 0 No Gait/Transferring Normal/ bed rest/ wheelchair 0 No Weak (short steps with or without shuffle, stooped  but able to lift head while walking, may 0 No seek support from furniture) Impaired (short steps with shuffle, may have difficulty arising from chair, head down, impaired 0 No balance) Mental Status Oriented to own ability 0 No Electronic Signature(s) Signed: 12/13/2020 2:51:09 PM By: Yevonne Pax RN Entered By: Yevonne Pax on 12/12/2020 09:27:43 Bethany [name] ([id number]) -------------------------------------------------------------------------------- Foot Assessment Details Patient Name: Bethany [name] Date of Service: 12/12/2020 8:45 AM Medical Record Number: [id number] Patient Account Number: [id number] Date of Birth/Sex: 06/06/1945 (75 y.o. F) Treating RN: Yevonne Pax Primary Care Vedansh Kerstetter: Lynn Ito Other Clinician: Referring Aubrie Lucien: Beverely Risen Treating Jissell Trafton/Extender: Rowan Blase in Treatment: 0 Foot Assessment Items Site Locations + = Sensation present, - = Sensation absent, C = Callus, U = Ulcer R = Redness, W = Warmth, M = Maceration, PU = Pre-ulcerative lesion F = Fissure, S = Swelling, D = Dryness Assessment Right: Left: Other Deformity: No No Prior Foot Ulcer: No No Prior Amputation: No No Charcot Joint: No No Ambulatory Status: Ambulatory Without Help Gait: Steady Electronic  Signature(s) Signed: 12/13/2020 2:51:09 PM By: Yevonne Pax RN Entered By: Yevonne Pax on 12/12/2020 09:30:01 Bethany [name] ([id number]) -------------------------------------------------------------------------------- Nutrition Risk Screening Details Patient Name: Bethany [name] Date of Service: 12/12/2020 8:45 AM Medical Record Number: [id number] Patient Account Number: [id number] Date of Birth/Sex: March 11, 1945 (75 y.o. F) Treating RN: Yevonne Pax Primary Care Lycan Davee: Lynn Ito Other Clinician: Referring Jaselle Pryer: Beverely Risen Treating Travaris Kosh/Extender: Rowan Blase in Treatment: 0 Height (in): 67 Weight (lbs): 300 Body Mass Index (BMI): 47 Nutrition Risk Screening Items Score Screening NUTRITION RISK SCREEN: I have an illness or condition that made me change the kind and/or amount of food I eat 0 No I eat fewer than two meals per day 0 No I eat few fruits and vegetables, or milk products 0 No I have three or more drinks of beer, liquor or wine almost every day 0 No I have tooth or mouth problems that make it hard for me to eat 0 No I don't always have enough money to buy the food I need 0 No I eat alone most of the time 0 No I take three or more different prescribed or over-the-counter drugs a day 1 Yes Without wanting to, I have lost or gained 10 pounds in the last six months 0 No I am not always physically able to shop, cook and/or feed myself 0 No Nutrition Protocols Good Risk Protocol Moderate Risk Protocol High Risk Proctocol Risk Level: Good Risk Score: 1 Electronic Signature(s) Signed: 12/13/2020 2:51:09 PM By: Yevonne Pax RN Entered By: Yevonne Pax on 12/12/2020 09:28:17

## 2020-12-19 ENCOUNTER — Other Ambulatory Visit: Payer: Self-pay

## 2020-12-19 ENCOUNTER — Ambulatory Visit: Payer: Medicare HMO | Admitting: Cardiovascular Disease

## 2020-12-19 ENCOUNTER — Encounter: Payer: Self-pay | Admitting: Cardiovascular Disease

## 2020-12-19 ENCOUNTER — Telehealth: Payer: Self-pay

## 2020-12-19 VITALS — BP 140/80 | HR 77 | Ht 67.0 in | Wt 303.4 lb

## 2020-12-19 DIAGNOSIS — E119 Type 2 diabetes mellitus without complications: Secondary | ICD-10-CM

## 2020-12-19 DIAGNOSIS — I10 Essential (primary) hypertension: Secondary | ICD-10-CM

## 2020-12-19 DIAGNOSIS — E785 Hyperlipidemia, unspecified: Secondary | ICD-10-CM | POA: Diagnosis not present

## 2020-12-19 DIAGNOSIS — R0602 Shortness of breath: Secondary | ICD-10-CM

## 2020-12-19 NOTE — Telephone Encounter (Signed)
(  Dr. Everardo All Pt) Patient will need the [facility] script [facility] to [facility] [place].   Call back phone [phone]

## 2020-12-19 NOTE — Patient Instructions (Signed)
Medication Instructions:  °Your physician recommends that you continue on your current medications as directed. Please refer to the Current Medication list given to you today. ° °*If you need a refill on your cardiac medications before your next appointment, please call your pharmacy* ° ° °Lab Work: °None ordered °If you have labs (blood work) drawn today and your tests are completely normal, you will receive your results only by: °MyChart Message (if you have MyChart) OR °A paper copy in the mail °If you have any lab test that is abnormal or we need to change your treatment, we will call you to review the results. ° ° °Testing/Procedures: °None ordered ° ° °Follow-Up: °At [facility], you and your health needs are our priority.  As part of our continuing mission to provide you with exceptional heart care, we have created designated Provider Care Teams.  These Care Teams include your primary Cardiologist (physician) and Advanced Practice Providers (APPs -  Physician Assistants and Nurse Practitioners) who all work together to provide you with the care you need, when you need it. ° °We recommend signing up for the patient portal called "MyChart".  Sign up information is provided on this After Visit Summary.  MyChart is used to connect with patients for Virtual Visits (Telemedicine).  Patients are able to view lab/test results, encounter notes, upcoming appointments, etc.  Non-urgent messages can be sent to your provider as well.   °To learn more about what you can do with MyChart, go to [email or web address].   ° °Your next appointment:   °As needed ° °The format for your next appointment:   °In Person ° °Provider:   °You may see Muhammad Arida, MD or one of the following Advanced Practice Providers on your designated Care Team:   °Christopher Berge, NP °Ryan Dunn, PA-C °Cadence Furth, PA-C{ ° ° ° °Other Instructions °N/A ° °

## 2020-12-19 NOTE — Progress Notes (Signed)
Cardiology Office Note   Date:  12/19/2020   ID:  Akshaya, Toepfer 25-Oct-1945, MRN [id number]  PCP:  Carlean Jews, PA-C  Cardiologist:   Lorine Bears, MD   Chief Complaint  Patient presents with   OTHER    2 Month f/u no complaints today. Meds reviewed verbally with pt.      History of Present Illness: Bethany [name] is a 76 y.o. female who is here today for follow-up visit regarding exertional dyspnea.   She has known history of diabetes mellitus, stage III chronic kidney disease, essential hypertension, hyperlipidemia, morbid obesity, and obstructive sleep apnea. Previous echocardiogram in October 2019 showed normal LV systolic function, grade 1 diastolic dysfunction, normal pulmonary pressure and no significant valvular abnormalities. She reports having COVID in September 2021 and since then she had worsening shortness of breath, fatigue and gradual 30 pound weight gain with increased leg edema.  She also reports right-sided chest pain described as cramping sensation at rest lasting for few seconds.  She is not a smoker.  She reports that her father died of myocardial infarction. She was recently started on CPAP but has not been tolerating it very well.  She does report mild orthopnea. She underwent an echocardiogram in November which showed normal LV systolic function, grade 2 diastolic dysfunction and no significant valvular abnormalities.  Her symptoms are still unchanged.  No chest pain but still with fatigue and exertional dyspnea.  She has not been able to lose any weight.  Her mobility is limited by chronic back pain and she uses a cane.  Past Medical History:  Diagnosis Date   Arthritis    Chronic kidney disease    Diabetes mellitus without complication (HCC)    Type II   Fibromyalgia    GERD (gastroesophageal reflux disease)    better after gall bladder surgery-    Hyperlipidemia    Hypertension    Shortness of breath dyspnea    with exertion     Past Surgical History:  Procedure Laterality Date   ABDOMINAL HYSTERECTOMY  01/15/1995   CATARACT EXTRACTION Bilateral 01/15/2012   CHOLECYSTECTOMY  01/15/2008   COLONOSCOPY     COLONOSCOPY WITH PROPOFOL N/A 04/21/2015   Procedure: COLONOSCOPY WITH PROPOFOL;  Surgeon: Wallace Cullens, MD;  Location: [facility] ENDOSCOPY;  Service: Gastroenterology;  Laterality: N/A;   EYE SURGERY Bilateral    Cataract   LUMBAR WOUND DEBRIDEMENT N/A 10/20/2014   Procedure: LUMBAR WOUND DEBRIDEMENT;  Surgeon: Tia Alert, MD;  Location: [facility] NEURO ORS;  Service: Neurosurgery;  Laterality: N/A;   MAXIMUM ACCESS (MAS)POSTERIOR LUMBAR INTERBODY FUSION (PLIF) 1 LEVEL N/A 09/16/2014   Procedure: L/4-5 FOR MAXIMUM ACCESS (MAS) POSTERIOR LUMBAR INTERBODY FUSION (PLIF) 1 LEVEL;  Surgeon: Tia Alert, MD;  Location: [facility] NEURO ORS;  Service: Neurosurgery;  Laterality: N/A;  L/4-5 FOR MAXIMUM ACCESS (MAS) POSTERIOR LUMBAR INTERBODY FUSION (PLIF) 1 LEVEL   REFRACTIVE SURGERY Left      Current Outpatient Medications  Medication Sig Dispense Refill   Alcohol Swabs (B-D SINGLE USE SWABS REGULAR) PADS USE TWICE DAILY 200 each 2   amLODipine (NORVASC) 10 MG tablet TAKE 1 TABLET (10 MG TOTAL) BY MOUTH DAILY. 90 tablet 1   aspirin 81 MG tablet Take 81 mg by mouth daily. Reported on 03/29/2015     atorvastatin (LIPITOR) 20 MG tablet TAKE 1 TABLET EVERY DAY 90 tablet 1   Blood Glucose Monitoring Suppl (TRUE METRIX METER) DEVI by Does not apply route.  Continuous Blood Gluc Sensor (FREESTYLE LIBRE 2 SENSOR) MISC 1 Device by Does not apply route every 14 (fourteen) days. 6 each 3   diclofenac Sodium (VOLTAREN) 1 % GEL Voltaren 1 % topical gel  APPLY 2 GRAMS TO THE AFFECTED AREA(S) BY TOPICAL ROUTE 4 TIMES PER DAY     docusate sodium (COLACE) 100 MG capsule Take 100 mg by mouth daily as needed for mild constipation. Reported on 03/29/2015     furosemide (LASIX) 20 MG tablet TAKE 1 TABLET EVERY DAY AS NEEDED 90 tablet 0   glucose  blood (TRUE METRIX BLOOD GLUCOSE TEST) test strip 1 each by Other route 4 (four) times daily. 400 each 1   insulin NPH-regular Human (NOVOLIN 70/30) (70-30) 100 UNIT/ML injection Inject 55 Units into the skin daily with breakfast. And syringes 1/day. 60 mL 11   losartan (COZAAR) 50 MG tablet TAKE 1 TABLET EVERY DAY 90 tablet 1   metoprolol succinate (TOPROL-XL) 50 MG 24 hr tablet Take 1 tablet (50 mg total) by mouth daily. Take with or immediately following a meal. 90 tablet 3   naproxen (NAPROSYN) 500 MG tablet Take 500 mg by mouth daily as needed.     naproxen sodium (ANAPROX) 220 MG tablet Take 220 mg by mouth as needed (for pain).     Omega-3 Fatty Acids (FISH OIL PO) Take 2,400 mg by mouth daily.     omeprazole (PRILOSEC) 20 MG capsule TAKE 1 CAPSULE EVERY DAY 90 capsule 1   triamcinolone cream (KENALOG) 0.1 % APPLY TOPICALLY 4 (FOUR) TIMES DAILY AS NEEDED FOR ITCHING 80 g 2   TRUEPLUS LANCETS 33G MISC TEST BLOOD SUGAR TWICE DAILY 200 each 2   No current facility-administered medications for this visit.    Allergies:   Patient has no known allergies.    Social History:  The patient  reports that she has never smoked. She has never used smokeless tobacco. She reports that she does not drink alcohol and does not use drugs.   Family History:  The patient's family history includes Arthritis in her mother; Cancer in her brother and mother; Diabetes in her brother and mother; Heart attack in her father; Heart disease in her father; Hypertension in her father.    ROS:  Please see the history of present illness.   Otherwise, review of systems are positive for none.   All other systems are reviewed and negative.    PHYSICAL EXAM: VS:  BP 140/80 (BP Location: Left Wrist, Patient Position: Sitting, Cuff Size: Large)   Pulse 77   Ht 5\' 7"  (1.702 m)   Wt (!) 303 lb 6 oz (137.6 kg)   SpO2 98%   BMI 47.52 kg/m  , BMI Body mass index is 47.52 kg/m. GEN: Well nourished, well developed, in no  acute distress  HEENT: normal  Neck: no JVD, carotid bruits, or masses Cardiac: RRR; no rubs, or gallops, 2 out of 6 systolic murmur in the aortic area which is early peaking.  There is mild to moderate bilateral leg edema Respiratory:  clear to auscultation bilaterally, normal work of breathing GI: soft, nontender, nondistended, + BS MS: no deformity or atrophy  Skin: warm and dry, no rash Neuro:  Strength and sensation are intact Psych: euthymic mood, full affect   EKG:  EKG is ordered today. The ekg ordered today demonstrates normal sinus rhythm.   Recent Labs: 01/27/2020: ALT 10; BUN 15; Creatinine, Ser 1.21; Hemoglobin 12.2; Platelets 270.0; Potassium 4.0; Sodium 142  Lipid Panel    Component Value Date/Time   CHOL 126 01/27/2020 1618   TRIG 118.0 01/27/2020 1618   HDL 49.90 01/27/2020 1618   CHOLHDL 3 01/27/2020 1618   VLDL 23.6 01/27/2020 1618   LDLCALC 52 01/27/2020 1618      Wt Readings from Last 3 Encounters:  12/19/20 (!) 303 lb 6 oz (137.6 kg)  11/23/20 300 lb (136.1 kg)  10/26/20 (!) 311 lb 9.6 oz (141.3 kg)       PAD Screen 10/10/2020  Previous PAD dx? Yes  Previous surgical procedure? Yes  Dates of procedures Back surgery, kidney stones  Pain with walking? Yes  Subsides with rest? No  Feet/toe relief with dangling? No  Painful, non-healing ulcers? No  Extremities discolored? No      ASSESSMENT AND PLAN:  1.  Exertional dyspnea : Suspect likely due to physical deconditioning in the setting of decreased physical activities and morbid obesity.  Echocardiogram showed normal LV systolic function with indeterminant diastolic function no significant valvular abnormalities.  Pulmonary pressure could not be estimated but no signs of pulmonary hypertension.  The patient clinically appears to be euvolemic. Although dyspnea can be angina equivalent, ischemic work-up is limited by body weight.  I do not think her symptoms are worse significant enough to  justify cardiac catheterization.    2.  Essential hypertension: Blood pressure is reasonably controlled on current medications.  3.  Hyperlipidemia: Currently on atorvastatin.  Recommended target LDL of less than 70 given that she is diabetic.  4.  Morbid obesity: I discussed with her the importance of starting an exercise program but she reports limitations related to chronic back pain and arthritis.  I asked her to consider water aerobics.   Disposition:   FU with me as needed.  Signed,  Kathlyn Sacramento, MD  12/19/2020 3:22 PM    [facility]

## 2020-12-20 MED ORDER — FREESTYLE LIBRE 2 SENSOR MISC: 1.0000 | 3 refills | Status: DC

## 2020-12-20 NOTE — Telephone Encounter (Signed)
Bethany [name] now sent into [facility]

## 2020-12-20 NOTE — Addendum Note (Signed)
Addended by: Eden Lathe on: 12/20/2020 01:39 PM   Modules accepted: Orders

## 2020-12-25 ENCOUNTER — Other Ambulatory Visit: Payer: Self-pay | Admitting: Endocrinology

## 2020-12-28 ENCOUNTER — Other Ambulatory Visit: Payer: Self-pay

## 2020-12-28 ENCOUNTER — Encounter (INDEPENDENT_AMBULATORY_CARE_PROVIDER_SITE_OTHER): Payer: Self-pay | Admitting: Vascular Surgery

## 2020-12-28 ENCOUNTER — Ambulatory Visit (INDEPENDENT_AMBULATORY_CARE_PROVIDER_SITE_OTHER): Payer: Medicare HMO | Admitting: Vascular Surgery

## 2020-12-28 VITALS — BP 147/84 | HR 73 | Resp 16 | Ht 67.5 in | Wt 301.6 lb

## 2020-12-28 DIAGNOSIS — Z794 Long term (current) use of insulin: Secondary | ICD-10-CM

## 2020-12-28 DIAGNOSIS — N183 Chronic kidney disease, stage 3 unspecified: Secondary | ICD-10-CM | POA: Diagnosis not present

## 2020-12-28 DIAGNOSIS — I872 Venous insufficiency (chronic) (peripheral): Secondary | ICD-10-CM

## 2020-12-28 DIAGNOSIS — E1122 Type 2 diabetes mellitus with diabetic chronic kidney disease: Secondary | ICD-10-CM

## 2020-12-28 DIAGNOSIS — I890 Lymphedema, not elsewhere classified: Secondary | ICD-10-CM

## 2020-12-28 DIAGNOSIS — I10 Essential (primary) hypertension: Secondary | ICD-10-CM | POA: Diagnosis not present

## 2020-12-28 DIAGNOSIS — K219 Gastro-esophageal reflux disease without esophagitis: Secondary | ICD-10-CM

## 2020-12-28 DIAGNOSIS — I5032 Chronic diastolic (congestive) heart failure: Secondary | ICD-10-CM | POA: Diagnosis not present

## 2020-12-28 NOTE — Progress Notes (Signed)
MRN : [id number]  Bethany [name] is a 75 y.o. (October 23, 1945) female who presents with chief complaint of leg swelling.  History of Present Illness:   Patient is seen for evaluation of leg pain and leg swelling. The patient first noticed the swelling remotely. The swelling is associated with pain and discoloration. The pain and swelling worsens with prolonged dependency and improves with elevation. The pain is unrelated to activity.  The patient notes that in the morning the legs are significantly improved but they steadily worsened throughout the course of the day. The patient also notes a steady worsening of the discoloration in the ankle and shin area.   The patient denies claudication symptoms.  The patient denies symptoms consistent with rest pain.  The patient has extensive DJD and LS spine disease.  The patient has no had any past angiography, interventions or vascular surgery.  Elevation makes the leg symptoms better, dependency makes them much worse. There is a history of ulcerations but none at that present. The patient denies any recent changes in medications.  The patient has not been wearing graduated compression.  The patient denies a history of DVT or PE. There is no prior history of phlebitis. There is no history of primary lymphedema.  No history of malignancies. No history of trauma or groin or pelvic surgery. There is no history of radiation treatment to the groin or pelvis  The patient denies amaurosis fugax or recent TIA symptoms. There are no recent neurological changes noted. The patient denies recent episodes of angina or shortness of breath   Current Meds  Medication Sig   Alcohol Swabs (DROPSAFE ALCOHOL PREP) 70 % PADS USE TWICE DAILY   amLODipine (NORVASC) 10 MG tablet TAKE 1 TABLET (10 MG TOTAL) BY MOUTH DAILY.   aspirin 81 MG tablet Take 81 mg by mouth daily. Reported on 03/29/2015   atorvastatin (LIPITOR) 20 MG tablet TAKE 1 TABLET EVERY DAY   Blood Glucose  Monitoring Suppl (TRUE METRIX METER) DEVI by Does not apply route.   Continuous Blood Gluc Sensor (FREESTYLE LIBRE 2 SENSOR) MISC 1 Device by Does not apply route every 14 (fourteen) days.   diclofenac Sodium (VOLTAREN) 1 % GEL Voltaren 1 % topical gel  APPLY 2 GRAMS TO THE AFFECTED AREA(S) BY TOPICAL ROUTE 4 TIMES PER DAY   docusate sodium (COLACE) 100 MG capsule Take 100 mg by mouth daily as needed for mild constipation. Reported on 03/29/2015   furosemide (LASIX) 20 MG tablet TAKE 1 TABLET EVERY DAY AS NEEDED   glucose blood (TRUE METRIX BLOOD GLUCOSE TEST) test strip 1 each by Other route 4 (four) times daily.   insulin NPH-regular Human (NOVOLIN 70/30) (70-30) 100 UNIT/ML injection Inject 55 Units into the skin daily with breakfast. And syringes 1/day.   losartan (COZAAR) 50 MG tablet TAKE 1 TABLET EVERY DAY   metoprolol succinate (TOPROL-XL) 50 MG 24 hr tablet Take 1 tablet (50 mg total) by mouth daily. Take with or immediately following a meal.   naproxen (NAPROSYN) 500 MG tablet Take 500 mg by mouth daily as needed.   naproxen sodium (ANAPROX) 220 MG tablet Take 220 mg by mouth as needed (for pain).   Omega-3 Fatty Acids (FISH OIL PO) Take 2,400 mg by mouth daily.   omeprazole (PRILOSEC) 20 MG capsule TAKE 1 CAPSULE EVERY DAY   triamcinolone cream (KENALOG) 0.1 % APPLY TOPICALLY 4 (FOUR) TIMES DAILY AS NEEDED FOR ITCHING   TRUEplus Lancets 33G MISC TEST BLOOD SUGAR  TWICE DAILY    Past Medical History:  Diagnosis Date   Arthritis    Chronic kidney disease    Diabetes mellitus without complication (HCC)    Type II   Fibromyalgia    GERD (gastroesophageal reflux disease)    better after gall bladder surgery-    Hyperlipidemia    Hypertension    Shortness of breath dyspnea    with exertion    Past Surgical History:  Procedure Laterality Date   ABDOMINAL HYSTERECTOMY  01/15/1995   CATARACT EXTRACTION Bilateral 01/15/2012   CHOLECYSTECTOMY  01/15/2008   COLONOSCOPY      COLONOSCOPY WITH PROPOFOL N/A 04/21/2015   Procedure: COLONOSCOPY WITH PROPOFOL;  Surgeon: Hulen Luster, MD;  Location: [facility] ENDOSCOPY;  Service: Gastroenterology;  Laterality: N/A;   EYE SURGERY Bilateral    Cataract   LUMBAR WOUND DEBRIDEMENT N/A 10/20/2014   Procedure: LUMBAR WOUND DEBRIDEMENT;  Surgeon: Eustace Moore, MD;  Location: [place] NEUR[facility] ORS;  Service: Neurosurgery;  Laterality: N/A;   MAXIMUM ACCESS (MAS)POSTERIOR LUMBAR INTERBODY FUSION (PLIF) 1 LEVEL N/A 09/16/2014   Procedure: L/4-5 FOR MAXIMUM ACCESS (MAS) POSTERIOR LUMBAR INTERBODY FUSION (PLIF) 1 LEVEL;  Surgeon: Eustace Moore, MD;  Location: [place] NEURO ORS;  Service: Neurosurgery;  Laterality: N/A;  L/4-5 FOR MAXIMUM ACCESS (MAS) POSTERIOR LUMBAR INTERBODY FUSION (PLIF) 1 LEVEL   REFRACTIVE SURGERY Left     Social History Social History   Tobacco Use   Smoking status: Never   Smokeless tobacco: Never  Vaping Use   Vaping Use: Never used  Substance Use Topics   Alcohol use: No   Drug use: No    Family History Family History  Problem Relation Age of Onset   Arthritis Mother    Cancer Mother        Ovarian   Diabetes Mother    Heart attack Father    Heart disease Father    Hypertension Father    Cancer Brother    Diabetes Brother     No Known Allergies   REVIEW OF SYSTEMS (Negative unless checked)  Constitutional: [] Weight loss  [] Fever  [] Chills Cardiac: [] Chest pain   [] Chest pressure   [] Palpitations   [] Shortness of breath when laying flat   [] Shortness of breath with exertion. Vascular:  [] Pain in legs with walking   [x] Pain in legs at rest  [] History of DVT   [] Phlebitis   [x] Swelling in legs   [] Varicose veins   [] Non-healing ulcers Pulmonary:   [] Uses home oxygen   [] Productive cough   [] Hemoptysis   [] Wheeze  [] COPD   [] Asthma Neurologic:  [] Dizziness   [] Seizures   [] History of stroke   [] History of TIA  [] Aphasia   [] Vissual changes   [] Weakness or numbness in arm   [] Weakness or numbness in  leg Musculoskeletal:   [] Joint swelling   [] Joint pain   [] Low back pain Hematologic:  [] Easy bruising  [] Easy bleeding   [] Hypercoagulable state   [] Anemic Gastrointestinal:  [] Diarrhea   [] Vomiting  [x] Gastroesophageal reflux/heartburn   [] Difficulty swallowing. Genitourinary:  [] Chronic kidney disease   [] Difficult urination  [] Frequent urination   [] Blood in urine Skin:  [x] Rashes   [] Ulcers  Psychological:  [] History of anxiety   []  History of major depression.  Physical Examination  Vitals:   12/28/20 1009  BP: (!) 147/84  Pulse: 73  Resp: 16  Weight: (!) 301 lb 9.6 oz (136.8 kg)  Height: 5' 7.5" (1.715 m)   Body mass index is 46.54 kg/m.  Gen: WD/WN, NAD Head: [place]/AT, No temporalis wasting.  Ear/Nose/Throat: Hearing grossly intact, nares w/o erythema or drainage, pinna without lesions Eyes: PER, EOMI, sclera nonicteric.  Neck: Supple, no gross masses.  No JVD.  Pulmonary:  Good air movement, no audible wheezing, no use of accessory muscles.  Cardiac: RRR, precordium not hyperdynamic. Vascular:  scattered varicosities present bilaterally.  severe venous stasis changes to the legs bilaterally.  4+ hard non-pitting edema  Vessel Right Left  Radial Palpable Palpable  Gastrointestinal: soft, non-distended. No guarding/no peritoneal signs.  Musculoskeletal: M/S 5/5 throughout.  No deformity.  Neurologic: CN 2-12 intact. Pain and light touch intact in extremities.  Symmetrical.  Speech is fluent. Motor exam as listed above. Psychiatric: Judgment intact, Mood & affect appropriate for pt's clinical situation. Dermatologic: Severe venous rashes no ulcers noted.  No changes consistent with cellulitis. Lymph : No lichenification or skin changes of chronic lymphedema.  CBC Lab Results  Component Value Date   WBC 8.6 01/27/2020   HGB 12.2 01/27/2020   HCT 37.7 01/27/2020   MCV 82.5 01/27/2020   PLT 270.0 01/27/2020    BMET    Component Value Date/Time   NA 142 01/27/2020 1618    K 4.0 01/27/2020 1618   CL 104 01/27/2020 1618   CO2 30 01/27/2020 1618   GLUCOSE 70 01/27/2020 1618   BUN 15 01/27/2020 1618   CREATININE 1.21 (H) 01/27/2020 1618   CALCIUM 9.4 01/27/2020 1618   GFRNONAA 55 (L) 10/20/2014 1039   GFRAA >60 10/20/2014 1039   CrCl cannot be calculated (Patient's most recent lab result is older than the maximum 21 days allowed.).  COAG Lab Results  Component Value Date   INR 1.14 09/06/2014    Radiology No results found.   Assessment/Plan 1. Lymphedema I have had a long discussion with the patient regarding swelling and why it  causes symptoms.  Patient will begin wearing graduated compression compression wraps class 1 (20-30 mmHg) on a daily basis a prescription was given. The patient will  beginning wearing the wraps first thing in the morning and removing them in the evening. The patient is instructed specifically not to sleep in the stockings.   In addition, behavioral modification will be initiated.  This will include frequent elevation, use of over the counter pain medications and exercise such as walking.  I have reviewed systemic causes for chronic edema such as liver, kidney and cardiac etiologies.  The patient denies problems with these organ systems.    Consideration for a lymph pump which I believe she clearly needs, will also be made based upon the effectiveness of conservative therapy.  This would help to improve the edema control and prevent sequela such as ulcers and infections   Patient should undergo duplex ultrasound of the venous system to ensure that DVT or reflux is not present.  The patient will follow-up after the ultrasound.    - VAS [place] LOWER EXTREMITY VENOUS REFLUX; Future  2. Chronic venous insufficiency I have had a long discussion with the patient regarding swelling and why it  causes symptoms.  Patient will begin wearing graduated compression compression wraps class 1 (20-30 mmHg) on a daily basis a prescription  was given. The patient will  beginning wearing the wraps first thing in the morning and removing them in the evening. The patient is instructed specifically not to sleep in the stockings.   In addition, behavioral modification will be initiated.  This will include frequent elevation, use of over the  counter pain medications and exercise such as walking.  I have reviewed systemic causes for chronic edema such as liver, kidney and cardiac etiologies.  The patient denies problems with these organ systems.    Consideration for a lymph pump which I believe she clearly needs, will also be made based upon the effectiveness of conservative therapy.  This would help to improve the edema control and prevent sequela such as ulcers and infections   Patient should undergo duplex ultrasound of the venous system to ensure that DVT or reflux is not present.  The patient will follow-up after the ultrasound.   - VAS [place] LOWER EXTREMITY VENOUS REFLUX; Future  3. Essential hypertension Continue antihypertensive medications as already ordered, these medications have been reviewed and there are no changes at this time.   4. CHF (congestive heart failure), NYHA class I, chronic, diastolic (HCC) Continue cardiac and antihypertensive medications as already ordered and reviewed, no changes at this time.  Continue statin as ordered and reviewed, no changes at this time  Nitrates PRN for chest pain   5. Gastroesophageal reflux disease without esophagitis Continue PPI as already ordered, this medication has been reviewed and there are no changes at this time.  Avoidence of caffeine and alcohol  Moderate elevation of the head of the bed    6. Type 2 diabetes mellitus with stage 3 chronic kidney disease, with long-term current use of insulin, unspecified whether stage 3a or 3b CKD (HCC) Continue hypoglycemic medications as already ordered, these medications have been reviewed and there are no changes at this  time.  Hgb A1C to be monitored as already arranged by primary service     Levora Dredge, MD  12/28/2020 10:12 AM

## 2021-01-10 ENCOUNTER — Other Ambulatory Visit: Payer: Self-pay

## 2021-01-10 ENCOUNTER — Encounter (INDEPENDENT_AMBULATORY_CARE_PROVIDER_SITE_OTHER): Payer: Self-pay | Admitting: Nurse Practitioner

## 2021-01-10 ENCOUNTER — Ambulatory Visit (INDEPENDENT_AMBULATORY_CARE_PROVIDER_SITE_OTHER): Payer: Medicare HMO

## 2021-01-10 ENCOUNTER — Ambulatory Visit (INDEPENDENT_AMBULATORY_CARE_PROVIDER_SITE_OTHER): Payer: Medicare HMO | Admitting: Nurse Practitioner

## 2021-01-10 VITALS — BP 162/81 | HR 88 | Ht 67.0 in | Wt 295.0 lb

## 2021-01-10 DIAGNOSIS — I872 Venous insufficiency (chronic) (peripheral): Secondary | ICD-10-CM | POA: Diagnosis not present

## 2021-01-10 DIAGNOSIS — E7800 Pure hypercholesterolemia, unspecified: Secondary | ICD-10-CM | POA: Diagnosis not present

## 2021-01-10 DIAGNOSIS — I890 Lymphedema, not elsewhere classified: Secondary | ICD-10-CM

## 2021-01-10 DIAGNOSIS — I10 Essential (primary) hypertension: Secondary | ICD-10-CM | POA: Diagnosis not present

## 2021-01-21 ENCOUNTER — Encounter (INDEPENDENT_AMBULATORY_CARE_PROVIDER_SITE_OTHER): Payer: Self-pay | Admitting: Nurse Practitioner

## 2021-01-21 NOTE — Progress Notes (Signed)
Subjective:    Patient ID: Bethany [name], female    DOB: 08/08/1945, 76 y.o.   MRN: [id number] Chief Complaint  Patient presents with   Follow-up    Pt con bil reflux     The patient returns to the office for followup evaluation regarding leg swelling.  The swelling has persisted and the pain associated with swelling continues. There have not been any interval development of a ulcerations or wounds.  Since the previous visit the patient has been wearing graduated compression stockings and has noted little if any improvement in the lymphedema. The patient has been using compression routinely morning until night.  The patient also states elevation during the day and exercise is being done too.    Today noninvasive studies show no evidence of DVT or superficial thrombophlebitis.  No evidence of deep venous insufficiency or superficial venous reflux bilaterally.   Review of Systems  Cardiovascular:  Positive for leg swelling.  All other systems reviewed and are negative.     Objective:   Physical Exam Vitals reviewed.  HENT:     Head: Normocephalic.  Cardiovascular:     Rate and Rhythm: Normal rate.  Pulmonary:     Effort: Pulmonary effort is normal.  Musculoskeletal:     Right lower leg: 2+ Edema present.     Left lower leg: 2+ Edema present.  Skin:    General: Skin is warm and dry.     Comments: Dermal thickening bilaterally  Neurological:     Mental Status: She is alert and oriented to person, place, and time.  Psychiatric:        Mood and Affect: Mood normal.        Behavior: Behavior normal.        Thought Content: Thought content normal.        Judgment: Judgment normal.    BP (!) 162/81    Pulse 88    Ht 5\' 7"  (1.702 m)    Wt 295 lb (133.8 kg)    BMI 46.20 kg/m   Past Medical History:  Diagnosis Date   Arthritis    Chronic kidney disease    Diabetes mellitus without complication (HCC)    Type II   Fibromyalgia    GERD (gastroesophageal reflux disease)     better after gall bladder surgery-    Hyperlipidemia    Hypertension    Shortness of breath dyspnea    with exertion    Social History   Socioeconomic History   Marital status: Widowed    Spouse name: Not on file   Number of children: Not on file   Years of education: Not on file   Highest education level: Not on file  Occupational History   Not on file  Tobacco Use   Smoking status: Never   Smokeless tobacco: Never  Vaping Use   Vaping Use: Never used  Substance and Sexual Activity   Alcohol use: No   Drug use: No   Sexual activity: Not Currently  Other Topics Concern   Not on file  Social History Narrative   Not on file   Social Determinants of Health   Financial Resource Strain: Not on file  Food Insecurity: Not on file  Transportation Needs: Not on file  Physical Activity: Not on file  Stress: Not on file  Social Connections: Not on file  Intimate Partner Violence: Not on file    Past Surgical History:  Procedure Laterality Date   ABDOMINAL HYSTERECTOMY  01/15/1995   CATARACT EXTRACTION Bilateral 01/15/2012   CHOLECYSTECTOMY  01/15/2008   COLONOSCOPY     COLONOSCOPY WITH PROPOFOL N/A 04/21/2015   Procedure: COLONOSCOPY WITH PROPOFOL;  Surgeon: Hulen Luster, MD;  Location: [facility] [place] END[facility]COPY;  Service: Gastroenterology;  Laterality: N/A;   EYE SURGERY Bilateral    Cataract   LUMBAR WOUND DEBRIDEMENT N/A 10/20/2014   Procedure: LUMBAR WOUND DEBRIDEMENT;  Surgeon: Eustace Moore, MD;  Location: [place] [facility]UR[facility] OR[facility];  Service: Neurosurgery;  Laterality: N/A;   MAXIMUM ACCESS (MAS)POSTERIOR LUMBAR INTERBODY FUSION (PLIF) 1 LEVEL N/A 09/16/2014   Procedure: L/4-5 FOR MAXIMUM ACCESS (MAS) POSTERIOR LUMBAR INTERBODY FUSION (PLIF) 1 LEVEL;  Surgeon: Eustace Moore, MD;  Location: [place] [facility]UR[facility] OR[facility];  Service: Neurosurgery;  Laterality: N/A;  L/4-5 FOR MAXIMUM ACCESS (MAS) POSTERIOR LUMBAR INTERBODY FUSION (PLIF) 1 LEVEL   REFRACTIVE SURGERY Left     Family History  Problem Relation  Age of Onset   Arthritis Mother    Cancer Mother        Ovarian   Diabetes Mother    Heart attack Father    Heart disease Father    Hypertension Father    Cancer Brother    Diabetes Brother     No Known Allergies  CBC Latest Ref Rng & Units 01/27/2020 06/29/2018 03/20/2017  WBC 4.0 - 10.5 K/uL 8.6 7.3 6.9  Hemoglobin 12.0 - 15.0 g/dL 12.2 12.2 12.7  Hematocrit 36.0 - 46.0 % 37.7 38.0 38.8  Platelets 150.0 - 400.0 K/uL 270.0 279.0 303.0      CMP     Component Value Date/Time   NA 142 01/27/2020 1618   K 4.0 01/27/2020 1618   CL 104 01/27/2020 1618   CO2 30 01/27/2020 1618   GLUCOSE 70 01/27/2020 1618   BUN 15 01/27/2020 1618   CREATININE 1.21 (H) 01/27/2020 1618   CALCIUM 9.4 01/27/2020 1618   PROT 7.9 01/27/2020 1618   ALBUMIN 4.3 01/27/2020 1618   AST 14 01/27/2020 1618   ALT 10 01/27/2020 1618   ALKPHOS 118 (H) 01/27/2020 1618   BILITOT 0.5 01/27/2020 1618   GFRNONAA 55 (L) 10/20/2014 1039   GFRAA >60 10/20/2014 1039     No results found.     Assessment & Plan:   1. Lymphedema Recommend:  No surgery or intervention at this point in time.    I have reviewed my previous discussion with the patient regarding swelling and why it causes symptoms.  Patient will continue wearing graduated compression stockings class 1 (20-30 mmHg) on a daily basis. The patient will  beginning wearing the stockings first thing in the morning and removing them in the evening. The patient is instructed specifically not to sleep in the stockings.    In addition, behavioral modification including several periods of elevation of the lower extremities during the day will be continued.  This was reviewed with the patient during the initial visit.  The patient will also continue routine exercise, especially walking on a daily basis as was discussed during the initial visit.    Despite conservative treatments of at least 4 weeks, including graduated compression therapy class 1 and behavioral  modification including exercise and elevation the patient  has not obtained adequate control of the lymphedema.  The patient still has stage 3 lymphedema and therefore, I believe that a lymph pump should be added to improve the control of the patient's lymphedema.  Additionally, a lymph pump is warranted because it will reduce the risk of cellulitis  and ulceration in the future.  Patient should follow-up in six months    2. Pure hypercholesterolemia Continue statin as ordered and reviewed, no changes at this time   3. Essential hypertension Continue antihypertensive medications as already ordered, these medications have been reviewed and there are no changes at this time.    Current Outpatient Medications on File Prior to Visit  Medication Sig Dispense Refill   Alcohol Swabs (DROPSAFE ALCOHOL PREP) 70 % PADS USE TWICE DAILY 200 each 2   amLODipine (NORVASC) 10 MG tablet TAKE 1 TABLET (10 MG TOTAL) BY MOUTH DAILY. 90 tablet 1   aspirin 81 MG tablet Take 81 mg by mouth daily. Reported on 03/29/2015     atorvastatin (LIPITOR) 20 MG tablet TAKE 1 TABLET EVERY DAY 90 tablet 1   Blood Glucose Monitoring Suppl (TRUE METRIX METER) DEVI by Does not apply route.     Continuous Blood Gluc Sensor (FREESTYLE LIBRE 2 SENSOR) MISC 1 Device by Does not apply route every 14 (fourteen) days. 6 each 3   diclofenac Sodium (VOLTAREN) 1 % GEL Voltaren 1 % topical gel  APPLY 2 GRAMS TO THE AFFECTED AREA(S) BY TOPICAL ROUTE 4 TIMES PER DAY     docusate sodium (COLACE) 100 MG capsule Take 100 mg by mouth daily as needed for mild constipation. Reported on 03/29/2015     furosemide (LASIX) 20 MG tablet TAKE 1 TABLET EVERY DAY AS NEEDED 90 tablet 0   glucose blood (TRUE METRIX BLOOD GLUCOSE TEST) test strip 1 each by Other route 4 (four) times daily. 400 each 1   insulin NPH-regular Human (NOVOLIN 70/30) (70-30) 100 UNIT/ML injection Inject 55 Units into the skin daily with breakfast. And syringes 1/day. 60 mL 11    losartan (COZAAR) 50 MG tablet TAKE 1 TABLET EVERY DAY 90 tablet 1   metoprolol succinate (TOPROL-XL) 50 MG 24 hr tablet Take 1 tablet (50 mg total) by mouth daily. Take with or immediately following a meal. 90 tablet 3   naproxen (NAPROSYN) 500 MG tablet Take 500 mg by mouth daily as needed.     naproxen sodium (ANAPROX) 220 MG tablet Take 220 mg by mouth as needed (for pain).     Omega-3 Fatty Acids (FISH OIL PO) Take 2,400 mg by mouth daily.     omeprazole (PRILOSEC) 20 MG capsule TAKE 1 CAPSULE EVERY DAY 90 capsule 1   triamcinolone cream (KENALOG) 0.1 % APPLY TOPICALLY 4 (FOUR) TIMES DAILY AS NEEDED FOR ITCHING 80 g 2   TRUEplus Lancets 33G MISC TEST BLOOD SUGAR TWICE DAILY 200 each 2   No current facility-administered medications on file prior to visit.    There are no Patient Instructions on file for this visit. No follow-ups on file.   Kris Hartmann, NP

## 2021-01-24 ENCOUNTER — Telehealth: Payer: Self-pay

## 2021-01-24 DIAGNOSIS — E785 Hyperlipidemia, unspecified: Secondary | ICD-10-CM | POA: Diagnosis not present

## 2021-01-24 DIAGNOSIS — R809 Proteinuria, unspecified: Secondary | ICD-10-CM | POA: Diagnosis not present

## 2021-01-24 DIAGNOSIS — I509 Heart failure, unspecified: Secondary | ICD-10-CM | POA: Diagnosis not present

## 2021-01-24 DIAGNOSIS — N1831 Chronic kidney disease, stage 3a: Secondary | ICD-10-CM | POA: Diagnosis not present

## 2021-01-24 DIAGNOSIS — I10 Essential (primary) hypertension: Secondary | ICD-10-CM | POA: Diagnosis not present

## 2021-01-24 DIAGNOSIS — R8290 Unspecified abnormal findings in urine: Secondary | ICD-10-CM | POA: Diagnosis not present

## 2021-01-24 DIAGNOSIS — R609 Edema, unspecified: Secondary | ICD-10-CM | POA: Diagnosis not present

## 2021-01-24 DIAGNOSIS — E1122 Type 2 diabetes mellitus with diabetic chronic kidney disease: Secondary | ICD-10-CM | POA: Diagnosis not present

## 2021-01-24 NOTE — Telephone Encounter (Signed)
Verbal order for OSA supplies signed by provider and placed in [facility] folder. 

## 2021-01-31 ENCOUNTER — Other Ambulatory Visit: Payer: Self-pay

## 2021-01-31 ENCOUNTER — Ambulatory Visit (INDEPENDENT_AMBULATORY_CARE_PROVIDER_SITE_OTHER): Payer: Medicare HMO | Admitting: Endocrinology

## 2021-01-31 VITALS — BP 176/76 | HR 85 | Ht 67.0 in | Wt 296.2 lb

## 2021-01-31 DIAGNOSIS — E119 Type 2 diabetes mellitus without complications: Secondary | ICD-10-CM

## 2021-01-31 LAB — POCT GLYCOSYLATED HEMOGLOBIN (HGB A1C): Hemoglobin A1C: 10.2 % — AB (ref 4.0–5.6)

## 2021-01-31 NOTE — Patient Instructions (Addendum)
Please continue the same insulin for now. Blood tests are requested for you today.  We'll let you know about the results.   If this confirms the A1c, you should add a once a week non-insulin injection, and reduce the insulin.  On this type of insulin schedule, you should eat meals on a regular schedule (especially lunch).  If a meal is missed or significantly delayed, your blood sugar could go low.    check your blood sugar twice a day.  vary the time of day when you check, between before the 3 meals, and at bedtime.  also check if you have symptoms of your blood sugar being too high or too low.  please keep a record of the readings and bring it to your next appointment here (or you can bring the meter itself).  You can write it on any piece of paper.  please call us sooner if your blood sugar goes below 70, or if you have a lot of readings over 200.   Please come back for a follow-up appointment in 2 months.

## 2021-01-31 NOTE — Progress Notes (Signed)
Subjective:    Patient ID: Bethany [name], female    DOB: 03/09/1945, 76 y.o.   MRN: [id number]  HPI Pt returns for f/u of diabetes mellitus: DM type: Insulin-requiring type 2 Dx'ed: 1998.  Complications: PN and stage 3 CRI.  Therapy: insulin since 2009.  GDM: never.   DKA: never.   Severe hypoglycemia: once (2020). Pancreatitis: never. Other: she takes QD insulin, after poor results with multiple daily injections, she eats meals at 9AM and 7PM.  she changed to 70/30, due to pattern of cbg's. She has [place] CGM   SDOH: she takes human insulin, due to cost.   Interval history: No recent steroids.  I reviewed continuous glucose monitor data.  Glucose varies from 70-280.  It decreases 3PM-8PM, then re-increases back until 12MN. She seldom has hypoglycemia, and these episodes are mild.  This happens at HS.  BP was recently normal at nephrology.   Past Medical History:  Diagnosis Date   Arthritis    Chronic kidney disease    Diabetes mellitus without complication (HCC)    Type II   Fibromyalgia    GERD (gastroesophageal reflux disease)    better after gall bladder surgery-    Hyperlipidemia    Hypertension    Shortness of breath dyspnea    with exertion    Past Surgical History:  Procedure Laterality Date   ABDOMINAL HYSTERECTOMY  01/15/1995   CATARACT EXTRACTION Bilateral 01/15/2012   CHOLECYSTECTOMY  01/15/2008   COLONOSCOPY     COLONOSCOPY WITH PROPOFOL N/A 04/21/2015   Procedure: COLONOSCOPY WITH PROPOFOL;  Surgeon: Hulen Luster, MD;  Location: [facility] END[facility]COPY;  Service: Gastroenterology;  Laterality: N/A;   EYE SURGERY Bilateral    Cataract   LUMBAR WOUND DEBRIDEMENT N/A 10/20/2014   Procedure: LUMBAR WOUND DEBRIDEMENT;  Surgeon: Eustace Moore, MD;  Location: [place] [facility]UR[facility] ORS;  Service: Neurosurgery;  Laterality: N/A;   MAXIMUM ACCESS (MAS)POSTERIOR LUMBAR INTERBODY FUSION (PLIF) 1 LEVEL N/A 09/16/2014   Procedure: L/4-5 FOR MAXIMUM ACCESS (MAS) POSTERIOR LUMBAR INTERBODY FUSION  (PLIF) 1 LEVEL;  Surgeon: Eustace Moore, MD;  Location: [place] NEUR[facility] ORS;  Service: Neurosurgery;  Laterality: N/A;  L/4-5 FOR MAXIMUM ACCESS (MAS) POSTERIOR LUMBAR INTERBODY FUSION (PLIF) 1 LEVEL   REFRACTIVE SURGERY Left     Social History   Socioeconomic History   Marital status: Widowed    Spouse name: Not on file   Number of children: Not on file   Years of education: Not on file   Highest education level: Not on file  Occupational History   Not on file  Tobacco Use   Smoking status: Never   Smokeless tobacco: Never  Vaping Use   Vaping Use: Never used  Substance and Sexual Activity   Alcohol use: No   Drug use: No   Sexual activity: Not Currently  Other Topics Concern   Not on file  Social History Narrative   Not on file   Social Determinants of Health   Financial Resource Strain: Not on file  Food Insecurity: Not on file  Transportation Needs: Not on file  Physical Activity: Not on file  Stress: Not on file  Social Connections: Not on file  Intimate Partner Violence: Not on file    Current Outpatient Medications on File Prior to Visit  Medication Sig Dispense Refill   Alcohol Swabs (DROPSAFE ALCOHOL PREP) 70 % PADS USE TWICE DAILY 200 each 2   amLODipine (NORVASC) 10 MG tablet TAKE 1 TABLET (10 MG TOTAL)  BY MOUTH DAILY. 90 tablet 1   aspirin 81 MG tablet Take 81 mg by mouth daily. Reported on 03/29/2015     atorvastatin (LIPITOR) 20 MG tablet TAKE 1 TABLET EVERY DAY 90 tablet 1   Blood Glucose Monitoring Suppl (TRUE METRIX METER) DEVI by Does not apply route.     Continuous Blood Gluc Sensor (FREESTYLE LIBRE 2 SENSOR) MISC 1 Device by Does not apply route every 14 (fourteen) days. 6 each 3   diclofenac Sodium (VOLTAREN) 1 % GEL Voltaren 1 % topical gel  APPLY 2 GRAMS TO THE AFFECTED AREA(S) BY TOPICAL ROUTE 4 TIMES PER DAY     docusate sodium (COLACE) 100 MG capsule Take 100 mg by mouth daily as needed for mild constipation. Reported on 03/29/2015     furosemide  (LASIX) 20 MG tablet TAKE 1 TABLET EVERY DAY AS NEEDED 90 tablet 0   glucose blood (TRUE METRIX BLOOD GLUCOSE TEST) test strip 1 each by Other route 4 (four) times daily. 400 each 1   insulin NPH-regular Human (NOVOLIN 70/30) (70-30) 100 UNIT/ML injection Inject 55 Units into the skin daily with breakfast. And syringes 1/day. 60 mL 11   losartan (COZAAR) 50 MG tablet TAKE 1 TABLET EVERY DAY 90 tablet 1   metoprolol succinate (TOPROL-XL) 50 MG 24 hr tablet Take 1 tablet (50 mg total) by mouth daily. Take with or immediately following a meal. 90 tablet 3   naproxen (NAPROSYN) 500 MG tablet Take 500 mg by mouth daily as needed.     naproxen sodium (ANAPROX) 220 MG tablet Take 220 mg by mouth as needed (for pain).     Omega-3 Fatty Acids (FISH OIL PO) Take 2,400 mg by mouth daily.     omeprazole (PRILOSEC) 20 MG capsule TAKE 1 CAPSULE EVERY DAY 90 capsule 1   triamcinolone cream (KENALOG) 0.1 % APPLY TOPICALLY 4 (FOUR) TIMES DAILY AS NEEDED FOR ITCHING 80 g 2   TRUEplus Lancets 33G MISC TEST BLOOD SUGAR TWICE DAILY 200 each 2   No current facility-administered medications on file prior to visit.    No Known Allergies  Family History  Problem Relation Age of Onset   Arthritis Mother    Cancer Mother        Ovarian   Diabetes Mother    Heart attack Father    Heart disease Father    Hypertension Father    Cancer Brother    Diabetes Brother     BP (!) 176/76    Pulse 85    Ht 5\' 7"  (1.702 m)    Wt 296 lb 3.2 oz (134.4 kg)    SpO2 97%    BMI 46.39 kg/m    Review of Systems     Objective:   Physical Exam    Lab Results  Component Value Date   HGBA1C 10.2 (A) 01/31/2021      Assessment & Plan:  Insulin-requiring type 2 DM: uncontrolled.   Patient Instructions  Please continue the same insulin for now. Blood tests are requested for you today.  We'll let you know about the results.   If this confirms the A1c, you should add a once a week non-insulin injection, and reduce the  insulin.  On this type of insulin schedule, you should eat meals on a regular schedule (especially lunch).  If a meal is missed or significantly delayed, your blood sugar could go low.    check your blood sugar twice a day.  vary the time of  day when you check, between before the 3 meals, and at bedtime.  also check if you have symptoms of your blood sugar being too high or too low.  please keep a record of the readings and bring it to your next appointment here (or you can bring the meter itself).  You can write it on any piece of paper.  please call us sooner if your blood sugar goes below 70, or if you have a lot of readings over 200.   Please come back for a follow-up appointment in 2 months.

## 2021-02-05 ENCOUNTER — Other Ambulatory Visit: Payer: Self-pay | Admitting: Endocrinology

## 2021-02-05 LAB — FRUCTOSAMINE: Fructosamine: 405 umol/L — ABNORMAL HIGH (ref 205–285)

## 2021-02-05 MED ORDER — NOVOLIN 70/30 (70-30) 100 UNIT/ML ~~LOC~~ SUSP
45.0000 [IU] | Freq: Every day | SUBCUTANEOUS | 2 refills | Status: DC
Start: 2021-02-05 — End: 2021-04-03

## 2021-02-05 MED ORDER — OZEMPIC (0.25 OR 0.5 MG/DOSE) 2 MG/1.5ML ~~LOC~~ SOPN: 0.2500 mg | PEN_INJECTOR | SUBCUTANEOUS | 3 refills | Status: DC

## 2021-02-07 ENCOUNTER — Encounter: Payer: Self-pay | Admitting: Endocrinology

## 2021-03-06 ENCOUNTER — Other Ambulatory Visit: Payer: Self-pay | Admitting: Endocrinology

## 2021-03-19 ENCOUNTER — Ambulatory Visit (INDEPENDENT_AMBULATORY_CARE_PROVIDER_SITE_OTHER): Payer: Medicare HMO | Admitting: Physician Assistant

## 2021-03-19 ENCOUNTER — Other Ambulatory Visit: Payer: Self-pay

## 2021-03-19 ENCOUNTER — Encounter: Payer: Self-pay | Admitting: Physician Assistant

## 2021-03-19 DIAGNOSIS — G4733 Obstructive sleep apnea (adult) (pediatric): Secondary | ICD-10-CM | POA: Diagnosis not present

## 2021-03-19 DIAGNOSIS — E559 Vitamin D deficiency, unspecified: Secondary | ICD-10-CM

## 2021-03-19 DIAGNOSIS — E7800 Pure hypercholesterolemia, unspecified: Secondary | ICD-10-CM | POA: Diagnosis not present

## 2021-03-19 DIAGNOSIS — E1151 Type 2 diabetes mellitus with diabetic peripheral angiopathy without gangrene: Secondary | ICD-10-CM | POA: Diagnosis not present

## 2021-03-19 DIAGNOSIS — R7989 Other specified abnormal findings of blood chemistry: Secondary | ICD-10-CM

## 2021-03-19 DIAGNOSIS — E538 Deficiency of other specified B group vitamins: Secondary | ICD-10-CM

## 2021-03-19 DIAGNOSIS — N1831 Chronic kidney disease, stage 3a: Secondary | ICD-10-CM

## 2021-03-19 DIAGNOSIS — I10 Essential (primary) hypertension: Secondary | ICD-10-CM

## 2021-03-19 DIAGNOSIS — I890 Lymphedema, not elsewhere classified: Secondary | ICD-10-CM

## 2021-03-19 DIAGNOSIS — R5383 Other fatigue: Secondary | ICD-10-CM

## 2021-03-19 NOTE — Progress Notes (Cosign Needed)
[facility] [place], [place] [address]  Internal MEDICINE  Office Visit Note  Patient Name: Bethany [name]  [id number]  [id number]  Date of Service: 03/19/2021  Chief Complaint  Patient presents with   Follow-up   Diabetes   Gastroesophageal Reflux   Hypertension   Hyperlipidemia   Quality Metric Gaps    Shingles and Pneumonia Vaccine   Medication Problem    Has diarrhea after eating since taking Ozempic    HPI Patient is here for routine follow-up -She states that she has an  appt on the 21st with endocrinology.she started on Ozempic about a month ago and reports that she has been feeling nauseous and having loose stools as likely side effect of the medication.  She reports it has been helping with her weight loss and thinks that she can push through the side effects until her follow-up however we did discuss importance of staying well-hydrated and getting enough nutrients and contact endocrinology due to her side effects -BP at home not checked recently -Vascular suggested compression stockings, but patient states these are difficult for her to put on -Not able to tolerate cpap and it has been picked up now. Difficulty with mask an then back pain causes her to move a lot and couldn't adjust mask that much. Sometimes sleeps in the recliner.  Denies any drowsiness with driving. -She is also followed by nephrology and cardiology and states no recent changes -Due for routine fasting labs prior to CPE next visit  Current Medication: Outpatient Encounter Medications as of 03/19/2021  Medication Sig   Alcohol Swabs (DROPSAFE ALCOHOL PREP) 70 % PADS USE TWICE DAILY   amLODipine (NORVASC) 10 MG tablet TAKE 1 TABLET (10 MG TOTAL) BY MOUTH DAILY.   aspirin 81 MG tablet Take 81 mg by mouth daily. Reported on 03/29/2015   atorvastatin (LIPITOR) 20 MG tablet TAKE 1 TABLET EVERY DAY   Blood Glucose Monitoring Suppl (TRUE METRIX METER) DEVI by Does not apply route.    Continuous Blood Gluc Sensor (FREESTYLE LIBRE 2 SENSOR) MISC 1 Device by Does not apply route every 14 (fourteen) days.   diclofenac Sodium (VOLTAREN) 1 % GEL Voltaren 1 % topical gel  APPLY 2 GRAMS TO THE AFFECTED AREA(S) BY TOPICAL ROUTE 4 TIMES PER DAY   docusate sodium (COLACE) 100 MG capsule Take 100 mg by mouth daily as needed for mild constipation. Reported on 03/29/2015   DROPLET INSULIN SYRINGE 30G X 1/2" 1 ML MISC USE TO INJECT INSULIN EVERY DAY WITH BREAKFAST   furosemide (LASIX) 20 MG tablet TAKE 1 TABLET EVERY DAY AS NEEDED   glucose blood (TRUE METRIX BLOOD GLUCOSE TEST) test strip 1 each by Other route 4 (four) times daily.   insulin NPH-regular Human (NOVOLIN 70/30) (70-30) 100 UNIT/ML injection Inject 45 Units into the skin daily with breakfast.   losartan (COZAAR) 50 MG tablet TAKE 1 TABLET EVERY DAY   metoprolol succinate (TOPROL-XL) 50 MG 24 hr tablet Take 1 tablet (50 mg total) by mouth daily. Take with or immediately following a meal.   naproxen (NAPROSYN) 500 MG tablet Take 500 mg by mouth daily as needed.   naproxen sodium (ANAPROX) 220 MG tablet Take 220 mg by mouth as needed (for pain).   Omega-3 Fatty Acids (FISH OIL PO) Take 2,400 mg by mouth daily.   omeprazole (PRILOSEC) 20 MG capsule TAKE 1 CAPSULE EVERY DAY   Semaglutide,0.25 or 0.5MG /DOS, (OZEMPIC, 0.25 OR 0.5 MG/DOSE,) 2 MG/1.5ML SOPN Inject 0.25  mg into the skin once a week.   triamcinolone cream (KENALOG) 0.1 % APPLY TOPICALLY 4 (FOUR) TIMES DAILY AS NEEDED FOR ITCHING   TRUEplus Lancets 33G MISC TEST BLOOD SUGAR TWICE DAILY   No facility-administered encounter medications on file as of 03/19/2021.    Surgical History: Past Surgical History:  Procedure Laterality Date   ABDOMINAL HYSTERECTOMY  01/15/1995   CATARACT EXTRACTION Bilateral 01/15/2012   CHOLECYSTECTOMY  01/15/2008   COLONOSCOPY     COLONOSCOPY WITH PROPOFOL N/A 04/21/2015   Procedure: COLONOSCOPY WITH PROPOFOL;  Surgeon: Hulen Luster, MD;   Location: [facility] ENDOSCOPY;  Service: Gastroenterology;  Laterality: N/A;   EYE SURGERY Bilateral    Cataract   LUMBAR WOUND DEBRIDEMENT N/A 10/20/2014   Procedure: LUMBAR WOUND DEBRIDEMENT;  Surgeon: Eustace Moore, MD;  Location: [place] [facility]UR[facility] ORS;  Service: Neurosurgery;  Laterality: N/A;   MAXIMUM ACCESS (MAS)POSTERIOR LUMBAR INTERBODY FUSION (PLIF) 1 LEVEL N/A 09/16/2014   Procedure: L/4-5 FOR MAXIMUM ACCESS (MAS) POSTERIOR LUMBAR INTERBODY FUSION (PLIF) 1 LEVEL;  Surgeon: Eustace Moore, MD;  Location: [place] [facility]UR[facility] ORS;  Service: Neurosurgery;  Laterality: N/A;  L/4-5 FOR MAXIMUM ACCESS (MAS) POSTERIOR LUMBAR INTERBODY FUSION (PLIF) 1 LEVEL   REFRACTIVE SURGERY Left     Medical History: Past Medical History:  Diagnosis Date   Arthritis    Chronic kidney disease    Diabetes mellitus without complication (HCC)    Type II   Fibromyalgia    GERD (gastroesophageal reflux disease)    better after gall bladder surgery-    Hyperlipidemia    Hypertension    Shortness of breath dyspnea    with exertion    Family History: Family History  Problem Relation Age of Onset   Arthritis Mother    Cancer Mother        Ovarian   Diabetes Mother    Heart attack Father    Heart disease Father    Hypertension Father    Cancer Brother    Diabetes Brother     Social History   Socioeconomic History   Marital status: Widowed    Spouse name: Not on file   Number of children: Not on file   Years of education: Not on file   Highest education level: Not on file  Occupational History   Not on file  Tobacco Use   Smoking status: Never   Smokeless tobacco: Never  Vaping Use   Vaping Use: Never used  Substance and Sexual Activity   Alcohol use: No   Drug use: No   Sexual activity: Not Currently  Other Topics Concern   Not on file  Social History Narrative   Not on file   Social Determinants of Health   Financial Resource Strain: Not on file  Food Insecurity: Not on file  Transportation  Needs: Not on file  Physical Activity: Not on file  Stress: Not on file  Social Connections: Not on file  Intimate Partner Violence: Not on file      Review of Systems  Constitutional:  Negative for chills, fatigue and unexpected weight change.  HENT:  Positive for postnasal drip. Negative for congestion, rhinorrhea, sneezing and sore throat.   Eyes:  Negative for redness.  Respiratory:  Negative for cough, chest tightness and shortness of breath.   Cardiovascular:  Positive for leg swelling. Negative for chest pain and palpitations.  Gastrointestinal:  Negative for abdominal pain, constipation, diarrhea, nausea and vomiting.  Genitourinary:  Negative for dysuria and frequency.  Musculoskeletal:  Positive for arthralgias and back pain. Negative for joint swelling and neck pain.  Skin:  Negative for rash.  Neurological: Negative.  Negative for tremors and numbness.  Hematological:  Negative for adenopathy. Does not bruise/bleed easily.  Psychiatric/Behavioral:  Negative for behavioral problems (Depression), sleep disturbance and suicidal ideas. The patient is not nervous/anxious.    Vital Signs: BP 132/70 Comment: 145/73   Pulse 85    Temp 98.3 F (36.8 C)    Resp 16    Ht 5\' 7"  (1.702 m)    Wt 289 lb (131.1 kg)    SpO2 99%    BMI 45.26 kg/m    Physical Exam Vitals and nursing note reviewed.  Constitutional:      General: She is not in acute distress.    Appearance: She is well-developed. She is obese. She is not diaphoretic.  HENT:     Head: Normocephalic and atraumatic.     Mouth/Throat:     Pharynx: No oropharyngeal exudate.  Eyes:     Pupils: Pupils are equal, round, and reactive to light.  Neck:     Thyroid: No thyromegaly.     Vascular: No JVD.     Trachea: No tracheal deviation.  Cardiovascular:     Rate and Rhythm: Normal rate and regular rhythm.     Heart sounds: Normal heart sounds. No murmur heard.   No friction rub. No gallop.  Pulmonary:     Effort:  Pulmonary effort is normal. No respiratory distress.     Breath sounds: No wheezing or rales.  Chest:     Chest wall: No tenderness.  Abdominal:     General: Bowel sounds are normal.     Palpations: Abdomen is soft.  Musculoskeletal:        General: Normal range of motion.     Cervical back: Normal range of motion and neck supple.     Right lower leg: Edema present.     Left lower leg: Edema present.  Lymphadenopathy:     Cervical: No cervical adenopathy.  Skin:    General: Skin is warm and dry.  Neurological:     Mental Status: She is alert and oriented to person, place, and time.     Cranial Nerves: No cranial nerve deficit.  Psychiatric:        Behavior: Behavior normal.        Thought Content: Thought content normal.        Judgment: Judgment normal.       Assessment/Plan: 1. Benign hypertension Stable, continue current medications  2. Lymphedema of both lower extremities Followed by vascular surgery with plan for compression stockings  3. OSA (obstructive sleep apnea) Unfortunately she was unable to tolerate CPAP and is turned in her machine.  Discussed continuing to work on weight loss  4. Diabetes mellitus with peripheral vascular disease ([place]) Followed by endocrinology  5. Stage 3a chronic kidney disease ([place]) Followed by nephrology  6. Pure hypercholesterolemia - Lipid Panel With LDL/HDL Ratio  7. Abnormal thyroid blood test - TSH + free T4  8. Vitamin D deficiency - VITAMIN D 25 Hydroxy (Vit-D Deficiency, Fractures)  9. B12 deficiency - B12 and Folate Panel  10. Other fatigue - CBC w/Diff/Platelet - Comprehensive metabolic panel   General Counseling: Fortune verbalizes understanding of the findings of todays visit and agrees with plan of treatment. I have discussed any further diagnostic evaluation that may be needed or ordered today. We also reviewed her medications today. she  has been encouraged to call the office with any questions or concerns  that should arise related to todays visit.    Orders Placed This Encounter  Procedures   CBC w/Diff/Platelet   Comprehensive metabolic panel   TSH + free T4   Lipid Panel With LDL/HDL Ratio   VITAMIN D 25 Hydroxy (Vit-D Deficiency, Fractures)   B12 and Folate Panel    No orders of the defined types were placed in this encounter.   This patient was seen by Drema Dallas, PA-C in collaboration with Dr. Clayborn Bigness as a part of collaborative care agreement.   Total time spent:30 Minutes Time spent includes review of chart, medications, test results, and follow up plan with the patient.      Dr Lavera Guise Internal medicine

## 2021-03-30 ENCOUNTER — Other Ambulatory Visit: Payer: Self-pay | Admitting: Physician Assistant

## 2021-03-30 DIAGNOSIS — E7800 Pure hypercholesterolemia, unspecified: Secondary | ICD-10-CM

## 2021-03-30 DIAGNOSIS — K219 Gastro-esophageal reflux disease without esophagitis: Secondary | ICD-10-CM

## 2021-04-03 ENCOUNTER — Other Ambulatory Visit: Payer: Self-pay

## 2021-04-03 ENCOUNTER — Encounter: Payer: Self-pay | Admitting: Endocrinology

## 2021-04-03 ENCOUNTER — Ambulatory Visit: Payer: Medicare HMO | Admitting: Endocrinology

## 2021-04-03 VITALS — BP 152/78 | HR 78 | Ht 67.0 in | Wt 288.6 lb

## 2021-04-03 DIAGNOSIS — E119 Type 2 diabetes mellitus without complications: Secondary | ICD-10-CM

## 2021-04-03 LAB — POCT GLYCOSYLATED HEMOGLOBIN (HGB A1C): Hemoglobin A1C: 7.8 % — AB (ref 4.0–5.6)

## 2021-04-03 MED ORDER — DEXCOM G6 TRANSMITTER MISC: 1.0000 | Freq: Once | 1 refills | Status: AC

## 2021-04-03 MED ORDER — NOVOLIN 70/30 (70-30) 100 UNIT/ML ~~LOC~~ SUSP: 40.0000 [IU] | Freq: Every day | SUBCUTANEOUS | 2 refills | Status: DC

## 2021-04-03 MED ORDER — DEXCOM G6 SENSOR MISC: 1.0000 | 3 refills | Status: DC

## 2021-04-03 NOTE — Progress Notes (Signed)
? ?Subjective:  ? ? Patient ID: Bethany [name], female    DOB: [birth year], 76 y.o.   MRN: [id number] ? ?HPI ?Pt returns for f/u of diabetes mellitus:  ?DM type: Insulin-requiring type 2 ?Dx'ed: 1998.  ?Complications: PN and stage 3 CRI.  ?Therapy: insulin since 2009.  ?GDM: never.   ?DKA: never.   ?Severe hypoglycemia: once (2020). ?Pancreatitis: never. ?Other: she takes QD insulin, after poor results with multiple daily injections, she eats meals at 9AM and 7PM.  she changed to 70/30, due to pattern of cbg's. She has [place] CGM; fructosamine converts to A1c lower than A1c itself.    ?SDOH: she takes human insulin, due to cost.   ?Interval history: No recent steroids.  I reviewed continuous glucose monitor data.  Glucose varies from 50-260. It is in general highest at 11AM, and less high at 12MN.  It is lowest at [facility].  It is less low at 5PM.  She has hypoglycemia approx qd.  This happens in the afternoon, after she misses lunch.   ?Past Medical History:  ?Diagnosis Date  ? Arthritis   ? Chronic kidney disease   ? Diabetes mellitus without complication ([place])   ? Type II  ? Fibromyalgia   ? GERD (gastroesophageal reflux disease)   ? better after gall bladder surgery-   ? Hyperlipidemia   ? Hypertension   ? Shortness of breath dyspnea   ? with exertion  ? ? ?Past Surgical History:  ?Procedure Laterality Date  ? ABDOMINAL HYSTERECTOMY  01/15/1995  ? CATARACT EXTRACTION Bilateral 01/15/2012  ? CHOLECYSTECTOMY  01/15/2008  ? COLONOSCOPY    ? COLONOSCOPY WITH PROPOFOL N/A 04/21/2015  ? Procedure: COLONOSCOPY WITH PROPOFOL;  Surgeon: Hulen Luster, MD;  [facility] ENDOSCOPY;  Service: Gastroenterology;  Laterality: N/A;  ? EYE SURGERY Bilateral   ? Cataract  ? LUMBAR WOUND DEBRIDEMENT N/A 10/20/2014  ? Procedure: LUMBAR WOUND DEBRIDEMENT;  Surgeon: Eustace Moore, MD;  Location: [place] NEUR[facility] ORS;  Service: Neurosurgery;  Laterality: N/A;  ? MAXIMUM ACCESS (MAS)POSTERIOR LUMBAR INTERBODY FUSION (PLIF) 1 LEVEL N/A 09/16/2014  ?  Procedure: L/4-5 FOR MAXIMUM ACCESS (MAS) POSTERIOR LUMBAR INTERBODY FUSION (PLIF) 1 LEVEL;  Surgeon: Eustace Moore, MD;  Location: [place] NEURO ORS;  Service: Neurosurgery;  Laterality: N/A;  L/4-5 FOR MAXIMUM ACCESS (MAS) POSTERIOR LUMBAR INTERBODY FUSION (PLIF) 1 LEVEL  ? REFRACTIVE SURGERY Left   ? ? ?Social History  ? ?Socioeconomic History  ? Marital status: Widowed  ?  Spouse name: Not on file  ? Number of children: Not on file  ? Years of education: Not on file  ? Highest education level: Not on file  ?Occupational History  ? Not on file  ?Tobacco Use  ? Smoking status: Never  ? Smokeless tobacco: Never  ?Vaping Use  ? Vaping Use: Never used  ?Substance and Sexual Activity  ? Alcohol use: No  ? Drug use: No  ? Sexual activity: Not Currently  ?Other Topics Concern  ? Not on file  ?Social History Narrative  ? Not on file  ? ?Social Determinants of Health  ? ?Financial Resource Strain: Not on file  ?Food Insecurity: Not on file  ?Transportation Needs: Not on file  ?Physical Activity: Not on file  ?Stress: Not on file  ?Social Connections: Not on file  ?Intimate Partner Violence: Not on file  ? ? ?Current Outpatient Medications on File Prior to Visit  ?Medication Sig Dispense Refill  ? Alcohol Swabs (DROPSAFE ALCOHOL PREP) 70 %  PADS USE TWICE DAILY 200 each 2  ? amLODipine (NORVASC) 10 MG tablet TAKE 1 TABLET (10 MG TOTAL) BY MOUTH DAILY. 90 tablet 1  ? aspirin 81 MG tablet Take 81 mg by mouth daily. Reported on 03/29/2015    ? atorvastatin (LIPITOR) 20 MG tablet TAKE 1 TABLET EVERY DAY 90 tablet 1  ? Blood Glucose Monitoring Suppl (TRUE METRIX METER) DEVI by Does not apply route.    ? diclofenac Sodium (VOLTAREN) 1 % GEL Voltaren 1 % topical gel ? APPLY 2 GRAMS TO THE AFFECTED AREA(S) BY TOPICAL ROUTE 4 TIMES PER DAY    ? docusate sodium (COLACE) 100 MG capsule Take 100 mg by mouth daily as needed for mild constipation. Reported on 03/29/2015    ? DROPLET INSULIN SYRINGE 30G X 1/2" 1 ML MISC USE TO INJECT INSULIN  EVERY DAY WITH BREAKFAST 100 each 2  ? furosemide (LASIX) 20 MG tablet TAKE 1 TABLET EVERY DAY AS NEEDED 90 tablet 0  ? glucose blood (TRUE METRIX BLOOD GLUCOSE TEST) test strip 1 each by Other route 4 (four) times daily. 400 each 1  ? losartan (COZAAR) 50 MG tablet TAKE 1 TABLET EVERY DAY 90 tablet 1  ? metoprolol succinate (TOPROL-XL) 50 MG 24 hr tablet Take 1 tablet (50 mg total) by mouth daily. Take with or immediately following a meal. 90 tablet 3  ? naproxen (NAPROSYN) 500 MG tablet Take 500 mg by mouth daily as needed.    ? naproxen sodium (ANAPROX) 220 MG tablet Take 220 mg by mouth as needed (for pain).    ? Omega-3 Fatty Acids (FISH OIL PO) Take 2,400 mg by mouth daily.    ? omeprazole (PRILOSEC) 20 MG capsule TAKE 1 CAPSULE EVERY DAY 90 capsule 1  ? Semaglutide,0.25 or 0.5MG /DOS, (OZEMPIC, 0.25 OR 0.5 MG/DOSE,) 2 MG/1.5ML SOPN Inject 0.25 mg into the skin once a week. 4.5 mL 3  ? triamcinolone cream (KENALOG) 0.1 % APPLY TOPICALLY 4 (FOUR) TIMES DAILY AS NEEDED FOR ITCHING 80 g 2  ? ?No current facility-administered medications on file prior to visit.  ? ? ?No Known Allergies ? ?Family History  ?Problem Relation Age of Onset  ? Arthritis Mother   ? Cancer Mother   ?     Ovarian  ? Diabetes Mother   ? Heart attack Father   ? Heart disease Father   ? Hypertension Father   ? Cancer Brother   ? Diabetes Brother   ? ? ?BP (!) 152/78 (BP Location: Right Arm, Patient Position: Sitting, Cuff Size: Normal)   Pulse 78   Ht 5\' 7"  (1.702 m)   Wt 288 lb 9.6 oz (130.9 kg)   SpO2 96%   BMI 45.20 kg/m?  ? ? ?Review of Systems ?She has HB ?   ?Objective:  ? Physical Exam ? ? ?Lab Results  ?Component Value Date  ? HGBA1C 7.8 (A) 04/03/2021  ? ?   ?Assessment & Plan:  ?Insulin-requiring type 2 DM ?Hypoglycemia, due to insulin: this limits aggressiveness of glycemic control.   ?HB, due to Ozempic.  We can't increase for now.   ? ?Patient Instructions  ?Please reduce the insulin to 40 units with breakfast.   ?Please  continue the same Ozempic.    ?On this type of insulin schedule, you should eat meals on a regular schedule (especially lunch).  If a meal is missed or significantly delayed, your blood sugar could go low.    ?check your blood sugar twice a  day.  vary the time of day when you check, between before the 3 meals, and at bedtime.  also check if you have symptoms of your blood sugar being too high or too low.  please keep a record of the readings and bring it to your next appointment here (or you can bring the meter itself).  You can write it on any piece of paper.  please call us sooner if your blood sugar goes below 70, or if you have a lot of readings over 200.   ?Please come back for a follow-up appointment in 3 months.   ? ? ?

## 2021-04-03 NOTE — Patient Instructions (Addendum)
Please reduce the insulin to 40 units with breakfast.   ?Please continue the same Ozempic.    ?On this type of insulin schedule, you should eat meals on a regular schedule (especially lunch).  If a meal is missed or significantly delayed, your blood sugar could go low.    ?check your blood sugar twice a day.  vary the time of day when you check, between before the 3 meals, and at bedtime.  also check if you have symptoms of your blood sugar being too high or too low.  please keep a record of the readings and bring it to your next appointment here (or you can bring the meter itself).  You can write it on any piece of paper.  please call us sooner if your blood sugar goes below 70, or if you have a lot of readings over 200.   ?Please come back for a follow-up appointment in 3 months.   ?

## 2021-04-23 ENCOUNTER — Telehealth: Payer: Self-pay

## 2021-04-23 ENCOUNTER — Other Ambulatory Visit (HOSPITAL_COMMUNITY): Payer: Self-pay

## 2021-04-23 NOTE — Telephone Encounter (Signed)
Patient Advocate Encounter ?  ?Received notification from [facility] that prior authorization for [facility] [employer] [employer] is required by his/her insurance [employer]. ? ?Tried to submit PA thru [facility] but had to call CMS instead.  They told me that the patient has to call them to enroll in order to submit the PA.  Please have the patient call: [phone].  ? ?[facility] will continue to follow: ? ?Patient Advocate ?Fax: [phone]  ?

## 2021-05-30 DIAGNOSIS — I509 Heart failure, unspecified: Secondary | ICD-10-CM | POA: Diagnosis not present

## 2021-05-30 DIAGNOSIS — E785 Hyperlipidemia, unspecified: Secondary | ICD-10-CM | POA: Diagnosis not present

## 2021-05-30 DIAGNOSIS — I10 Essential (primary) hypertension: Secondary | ICD-10-CM | POA: Diagnosis not present

## 2021-05-30 DIAGNOSIS — R609 Edema, unspecified: Secondary | ICD-10-CM | POA: Diagnosis not present

## 2021-05-30 DIAGNOSIS — E1122 Type 2 diabetes mellitus with diabetic chronic kidney disease: Secondary | ICD-10-CM | POA: Diagnosis not present

## 2021-05-30 DIAGNOSIS — E663 Overweight: Secondary | ICD-10-CM | POA: Diagnosis not present

## 2021-05-30 DIAGNOSIS — N1831 Chronic kidney disease, stage 3a: Secondary | ICD-10-CM | POA: Diagnosis not present

## 2021-05-30 DIAGNOSIS — Z6841 Body Mass Index (BMI) 40.0 and over, adult: Secondary | ICD-10-CM | POA: Diagnosis not present

## 2021-06-06 ENCOUNTER — Other Ambulatory Visit: Payer: Self-pay | Admitting: Internal Medicine

## 2021-06-12 DIAGNOSIS — E7800 Pure hypercholesterolemia, unspecified: Secondary | ICD-10-CM | POA: Diagnosis not present

## 2021-06-12 DIAGNOSIS — R5383 Other fatigue: Secondary | ICD-10-CM | POA: Diagnosis not present

## 2021-06-12 DIAGNOSIS — E538 Deficiency of other specified B group vitamins: Secondary | ICD-10-CM | POA: Diagnosis not present

## 2021-06-12 DIAGNOSIS — E559 Vitamin D deficiency, unspecified: Secondary | ICD-10-CM | POA: Diagnosis not present

## 2021-06-12 DIAGNOSIS — R7989 Other specified abnormal findings of blood chemistry: Secondary | ICD-10-CM | POA: Diagnosis not present

## 2021-06-13 LAB — CBC WITH DIFFERENTIAL/PLATELET
Basophils Absolute: 0.1 10*3/uL (ref 0.0–0.2)
Basos: 1 %
EOS (ABSOLUTE): 0.2 10*3/uL (ref 0.0–0.4)
Eos: 3 %
Hematocrit: 39.1 % (ref 34.0–46.6)
Hemoglobin: 12.3 g/dL (ref 11.1–15.9)
Immature Grans (Abs): 0 10*3/uL (ref 0.0–0.1)
Immature Granulocytes: 0 %
Lymphocytes Absolute: 2.5 10*3/uL (ref 0.7–3.1)
Lymphs: 31 %
MCH: 26.5 pg — ABNORMAL LOW (ref 26.6–33.0)
MCHC: 31.5 g/dL (ref 31.5–35.7)
MCV: 84 fL (ref 79–97)
Monocytes Absolute: 0.7 10*3/uL (ref 0.1–0.9)
Monocytes: 8 %
Neutrophils Absolute: 4.6 10*3/uL (ref 1.4–7.0)
Neutrophils: 57 %
Platelets: 315 10*3/uL (ref 150–450)
RBC: 4.64 x10E6/uL (ref 3.77–5.28)
RDW: 14.2 % (ref 11.7–15.4)
WBC: 8 10*3/uL (ref 3.4–10.8)

## 2021-06-13 LAB — LIPID PANEL WITH LDL/HDL RATIO
Cholesterol, Total: 139 mg/dL (ref 100–199)
HDL: 41 mg/dL (ref >39)
LDL Chol Calc (NIH): 72 mg/dL (ref 0–99)
LDL/HDL Ratio: 1.8 ratio (ref 0.0–3.2)
Triglycerides: 151 mg/dL — ABNORMAL HIGH (ref 0–149)
VLDL Cholesterol Cal: 26 mg/dL (ref 5–40)

## 2021-06-13 LAB — COMPREHENSIVE METABOLIC PANEL
ALT: 12 IU/L (ref 0–32)
AST: 12 IU/L (ref 0–40)
Albumin/Globulin Ratio: 1.3 (ref 1.2–2.2)
Albumin: 4.2 g/dL (ref 3.7–4.7)
Alkaline Phosphatase: 164 IU/L — ABNORMAL HIGH (ref 44–121)
BUN/Creatinine Ratio: 18 (ref 12–28)
BUN: 18 mg/dL (ref 8–27)
Bilirubin Total: 0.3 mg/dL (ref 0.0–1.2)
CO2: 23 mmol/L (ref 20–29)
Calcium: 9.8 mg/dL (ref 8.7–10.3)
Chloride: 103 mmol/L (ref 96–106)
Creatinine, Ser: 1.01 mg/dL — ABNORMAL HIGH (ref 0.57–1.00)
Globulin, Total: 3.2 g/dL (ref 1.5–4.5)
Glucose: 231 mg/dL — ABNORMAL HIGH (ref 70–99)
Potassium: 4.8 mmol/L (ref 3.5–5.2)
Sodium: 144 mmol/L (ref 134–144)
Total Protein: 7.4 g/dL (ref 6.0–8.5)
eGFR: 57 mL/min/{1.73_m2} — ABNORMAL LOW (ref >59)

## 2021-06-13 LAB — TSH+FREE T4
Free T4: 1.15 ng/dL (ref 0.82–1.77)
TSH: 3.24 u[IU]/mL (ref 0.450–4.500)

## 2021-06-13 LAB — VITAMIN D 25 HYDROXY (VIT D DEFICIENCY, FRACTURES): Vit D, 25-Hydroxy: 14.9 ng/mL — ABNORMAL LOW (ref 30.0–100.0)

## 2021-06-13 LAB — B12 AND FOLATE PANEL
Folate: 11.8 ng/mL (ref >3.0)
Vitamin B-12: 267 pg/mL (ref 232–1245)

## 2021-06-18 ENCOUNTER — Other Ambulatory Visit: Payer: Self-pay | Admitting: Physician Assistant

## 2021-06-18 ENCOUNTER — Encounter: Payer: Self-pay | Admitting: Physician Assistant

## 2021-06-18 ENCOUNTER — Ambulatory Visit (INDEPENDENT_AMBULATORY_CARE_PROVIDER_SITE_OTHER): Payer: Medicare HMO | Admitting: Physician Assistant

## 2021-06-18 VITALS — BP 142/67 | HR 77 | Temp 98.2°F | Resp 16 | Ht 67.0 in | Wt 286.8 lb

## 2021-06-18 DIAGNOSIS — N1831 Chronic kidney disease, stage 3a: Secondary | ICD-10-CM | POA: Diagnosis not present

## 2021-06-18 DIAGNOSIS — E782 Mixed hyperlipidemia: Secondary | ICD-10-CM

## 2021-06-18 DIAGNOSIS — I10 Essential (primary) hypertension: Secondary | ICD-10-CM

## 2021-06-18 DIAGNOSIS — Z0001 Encounter for general adult medical examination with abnormal findings: Secondary | ICD-10-CM | POA: Diagnosis not present

## 2021-06-18 DIAGNOSIS — E559 Vitamin D deficiency, unspecified: Secondary | ICD-10-CM

## 2021-06-18 DIAGNOSIS — R300 Dysuria: Secondary | ICD-10-CM

## 2021-06-18 DIAGNOSIS — Z1231 Encounter for screening mammogram for malignant neoplasm of breast: Secondary | ICD-10-CM | POA: Diagnosis not present

## 2021-06-18 DIAGNOSIS — E538 Deficiency of other specified B group vitamins: Secondary | ICD-10-CM

## 2021-06-18 DIAGNOSIS — E1151 Type 2 diabetes mellitus with diabetic peripheral angiopathy without gangrene: Secondary | ICD-10-CM | POA: Diagnosis not present

## 2021-06-18 DIAGNOSIS — I890 Lymphedema, not elsewhere classified: Secondary | ICD-10-CM | POA: Diagnosis not present

## 2021-06-18 MED ORDER — CYANOCOBALAMIN 1000 MCG/ML IJ SOLN
1000.0000 ug | Freq: Once | INTRAMUSCULAR | Status: AC
  Administered 2021-06-18: 1000 ug via INTRAMUSCULAR

## 2021-06-18 MED ORDER — ERGOCALCIFEROL 1.25 MG (50000 UT) PO CAPS: ORAL_CAPSULE | ORAL | 3 refills | Status: DC

## 2021-06-18 NOTE — Progress Notes (Signed)
[facility] [place], [place] [address]  Internal MEDICINE  Office Visit Note  Patient Name: Bethany [name]  [id number]  [id number]  Date of Service: 06/27/2021  Chief Complaint  Patient presents with   Medicare Wellness   Diabetes   Hyperlipidemia   Hypertension   Quality Metric Gaps    Eye Exam, Tetanus, Shingles and Pneumonia     HPI Pt is here for routine health maintenance examination -She continues to be followed by nephrology and endocrinology -Labs reviewed and show low B12, borderline low GFR, elevated alk phos and elevated TG, low vit D -back pain--alternates between tylenol and aleve -due for routine eye exam and will schedule. Had laser surgery a few months ago and will follow up. -will look in to shingles and PNA vaccines -Bp at home not checked regularly but did get a new cuff -Still has ankle swelling, followed by vascular -requests mammogram order  Current Medication: Outpatient Encounter Medications as of 06/18/2021  Medication Sig   Alcohol Swabs (DROPSAFE ALCOHOL PREP) 70 % PADS USE TWICE DAILY   aspirin 81 MG tablet Take 81 mg by mouth daily. Reported on 03/29/2015   atorvastatin (LIPITOR) 20 MG tablet TAKE 1 TABLET EVERY DAY   Blood Glucose Monitoring Suppl (TRUE METRIX METER) DEVI by Does not apply route.   Continuous Blood Gluc Sensor (DEXCOM G6 SENSOR) MISC 1 Device by Does not apply route See admin instructions. Change every 10 days   diclofenac Sodium (VOLTAREN) 1 % GEL Voltaren 1 % topical gel  APPLY 2 GRAMS TO THE AFFECTED AREA(S) BY TOPICAL ROUTE 4 TIMES PER DAY   docusate sodium (COLACE) 100 MG capsule Take 100 mg by mouth daily as needed for mild constipation. Reported on 03/29/2015   DROPLET INSULIN SYRINGE 30G X 1/2" 1 ML MISC USE TO INJECT INSULIN EVERY DAY WITH BREAKFAST   ergocalciferol (DRISDOL) 1.25 MG (50000 UT) capsule Take one cap q week   furosemide (LASIX) 20 MG tablet TAKE 1 TABLET EVERY DAY AS NEEDED    glucose blood (TRUE METRIX BLOOD GLUCOSE TEST) test strip 1 each by Other route 4 (four) times daily.   insulin NPH-regular Human (NOVOLIN 70/30) (70-30) 100 UNIT/ML injection Inject 40 Units into the skin daily with breakfast.   losartan (COZAAR) 50 MG tablet TAKE 1 TABLET EVERY DAY   metoprolol succinate (TOPROL-XL) 50 MG 24 hr tablet TAKE 1 TABLET EVERY DAY TAKE WITH OR IMMEDIATELY FOLLOWING A MEAL   naproxen (NAPROSYN) 500 MG tablet Take 500 mg by mouth daily as needed.   naproxen sodium (ANAPROX) 220 MG tablet Take 220 mg by mouth as needed (for pain).   Omega-3 Fatty Acids (FISH OIL PO) Take 2,400 mg by mouth daily.   omeprazole (PRILOSEC) 20 MG capsule TAKE 1 CAPSULE EVERY DAY   Semaglutide,0.25 or 0.5MG/DOS, (OZEMPIC, 0.25 OR 0.5 MG/DOSE,) 2 MG/1.5ML SOPN Inject 0.25 mg into the skin once a week.   triamcinolone cream (KENALOG) 0.1 % APPLY TOPICALLY 4 (FOUR) TIMES DAILY AS NEEDED FOR ITCHING   [DISCONTINUED] amLODipine (NORVASC) 10 MG tablet TAKE 1 TABLET (10 MG TOTAL) BY MOUTH DAILY.   [EXPIRED] cyanocobalamin ((VITAMIN B-12)) injection 1,000 mcg    No facility-administered encounter medications on file as of 06/18/2021.    Surgical History: Past Surgical History:  Procedure Laterality Date   ABDOMINAL HYSTERECTOMY  01/15/1995   CATARACT EXTRACTION Bilateral 01/15/2012   CHOLECYSTECTOMY  01/15/2008   COLONOSCOPY     COLONOSCOPY WITH PROPOFOL N/A 04/21/2015  Procedure: COLONOSCOPY WITH PROPOFOL;  Surgeon: Hulen Luster, MD;  Location: [facility] ENDOSCOPY;  Service: Gastroenterology;  Laterality: N/A;   EYE SURGERY Bilateral    Cataract   LUMBAR WOUND DEBRIDEMENT N/A 10/20/2014   Procedure: LUMBAR WOUND DEBRIDEMENT;  Surgeon: Eustace Moore, MD;  Location: [place] NEURO ORS;  Service: Neurosurgery;  Laterality: N/A;   MAXIMUM ACCESS (MAS)POSTERIOR LUMBAR INTERBODY FUSION (PLIF) 1 LEVEL N/A 09/16/2014   Procedure: L/4-5 FOR MAXIMUM ACCESS (MAS) POSTERIOR LUMBAR INTERBODY FUSION (PLIF) 1 LEVEL;   Surgeon: Eustace Moore, MD;  Location: [facility] NEURO ORS;  Service: Neurosurgery;  Laterality: N/A;  L/4-5 FOR MAXIMUM ACCESS (MAS) POSTERIOR LUMBAR INTERBODY FUSION (PLIF) 1 LEVEL   REFRACTIVE SURGERY Left     Medical History: Past Medical History:  Diagnosis Date   Arthritis    Chronic kidney disease    Diabetes mellitus without complication (HCC)    Type II   Fibromyalgia    GERD (gastroesophageal reflux disease)    better after gall bladder surgery-    Hyperlipidemia    Hypertension    Shortness of breath dyspnea    with exertion    Family History: Family History  Problem Relation Age of Onset   Arthritis Mother    Cancer Mother        Ovarian   Diabetes Mother    Heart attack Father    Heart disease Father    Hypertension Father    Cancer Brother    Diabetes Brother       Review of Systems  Constitutional:  Negative for chills, fatigue and unexpected weight change.  HENT:  Positive for postnasal drip. Negative for congestion, rhinorrhea, sneezing and sore throat.   Eyes:  Negative for redness.  Respiratory:  Negative for cough, chest tightness and shortness of breath.   Cardiovascular:  Positive for leg swelling. Negative for chest pain and palpitations.  Gastrointestinal:  Negative for abdominal pain, constipation, diarrhea, nausea and vomiting.  Genitourinary:  Negative for dysuria and frequency.  Musculoskeletal:  Positive for arthralgias and back pain. Negative for joint swelling and neck pain.  Skin:  Negative for rash.  Neurological: Negative.  Negative for tremors and numbness.  Hematological:  Negative for adenopathy. Does not bruise/bleed easily.  Psychiatric/Behavioral:  Negative for behavioral problems (Depression), sleep disturbance and suicidal ideas. The patient is not nervous/anxious.      Vital Signs: BP (!) 142/67   Pulse 77   Temp 98.2 F (36.8 C)   Resp 16   Ht 5' 7" (1.702 m)   Wt 286 lb 12.8 oz (130.1 kg)   SpO2 99%   BMI 44.92 kg/m     Physical Exam Vitals and nursing note reviewed.  Constitutional:      General: She is not in acute distress.    Appearance: She is well-developed. She is obese. She is not diaphoretic.  HENT:     Head: Normocephalic and atraumatic.     Mouth/Throat:     Pharynx: No oropharyngeal exudate.  Eyes:     Pupils: Pupils are equal, round, and reactive to light.  Neck:     Thyroid: No thyromegaly.     Vascular: No JVD.     Trachea: No tracheal deviation.  Cardiovascular:     Rate and Rhythm: Normal rate and regular rhythm.     Heart sounds: Normal heart sounds. No murmur heard.    No friction rub. No gallop.  Pulmonary:     Effort: Pulmonary effort is normal. No  respiratory distress.     Breath sounds: No wheezing or rales.  Chest:     Chest wall: No tenderness.  Abdominal:     General: Bowel sounds are normal.     Palpations: Abdomen is soft.  Musculoskeletal:        General: Normal range of motion.     Cervical back: Normal range of motion and neck supple.     Right lower leg: Edema present.     Left lower leg: Edema present.  Lymphadenopathy:     Cervical: No cervical adenopathy.  Skin:    General: Skin is warm and dry.  Neurological:     Mental Status: She is alert and oriented to person, place, and time.     Cranial Nerves: No cranial nerve deficit.  Psychiatric:        Behavior: Behavior normal.        Thought Content: Thought content normal.        Judgment: Judgment normal.      LABS: Recent Results (from the past 2160 hour(s))  POCT HgB A1C     Status: Abnormal   Collection Time: 04/03/21 10:51 AM  Result Value Ref Range   Hemoglobin A1C 7.8 (A) 4.0 - 5.6 %   HbA1c POC (<> result, manual entry)     HbA1c, POC (prediabetic range)     HbA1c, POC (controlled diabetic range)    CBC w/Diff/Platelet     Status: Abnormal   Collection Time: 06/12/21 10:36 AM  Result Value Ref Range   WBC 8.0 3.4 - 10.8 x10E3/uL   RBC 4.64 3.77 - 5.28 x10E6/uL   Hemoglobin  12.3 11.1 - 15.9 g/dL   Hematocrit 39.1 34.0 - 46.6 %   MCV 84 79 - 97 fL   MCH 26.5 (L) 26.6 - 33.0 pg   MCHC 31.5 31.5 - 35.7 g/dL   RDW 14.2 11.7 - 15.4 %   Platelets 315 150 - 450 x10E3/uL   Neutrophils 57 Not Estab. %   Lymphs 31 Not Estab. %   Monocytes 8 Not Estab. %   Eos 3 Not Estab. %   Basos 1 Not Estab. %   Neutrophils Absolute 4.6 1.4 - 7.0 x10E3/uL   Lymphocytes Absolute 2.5 0.7 - 3.1 x10E3/uL   Monocytes Absolute 0.7 0.1 - 0.9 x10E3/uL   EOS (ABSOLUTE) 0.2 0.0 - 0.4 x10E3/uL   Basophils Absolute 0.1 0.0 - 0.2 x10E3/uL   Immature Granulocytes 0 Not Estab. %   Immature Grans (Abs) 0.0 0.0 - 0.1 x10E3/uL  Comprehensive metabolic panel     Status: Abnormal   Collection Time: 06/12/21 10:36 AM  Result Value Ref Range   Glucose 231 (H) 70 - 99 mg/dL   BUN 18 8 - 27 mg/dL   Creatinine, Ser 1.01 (H) 0.57 - 1.00 mg/dL   eGFR 57 (L) >59 mL/min/1.73   BUN/Creatinine Ratio 18 12 - 28   Sodium 144 134 - 144 mmol/L   Potassium 4.8 3.5 - 5.2 mmol/L   Chloride 103 96 - 106 mmol/L   CO2 23 20 - 29 mmol/L   Calcium 9.8 8.7 - 10.3 mg/dL   Total Protein 7.4 6.0 - 8.5 g/dL   Albumin 4.2 3.7 - 4.7 g/dL   Globulin, Total 3.2 1.5 - 4.5 g/dL   Albumin/Globulin Ratio 1.3 1.2 - 2.2   Bilirubin Total 0.3 0.0 - 1.2 mg/dL   Alkaline Phosphatase 164 (H) 44 - 121 IU/L   AST 12 0 - 40 IU/L  ALT 12 0 - 32 IU/L  TSH + free T4     Status: None   Collection Time: 06/12/21 10:36 AM  Result Value Ref Range   TSH 3.240 0.450 - 4.500 uIU/mL   Free T4 1.15 0.82 - 1.77 ng/dL  Lipid Panel With LDL/HDL Ratio     Status: Abnormal   Collection Time: 06/12/21 10:36 AM  Result Value Ref Range   Cholesterol, Total 139 100 - 199 mg/dL   Triglycerides 151 (H) 0 - 149 mg/dL   HDL 41 >39 mg/dL   VLDL Cholesterol Cal 26 5 - 40 mg/dL   LDL Chol Calc (NIH) 72 0 - 99 mg/dL   LDL/HDL Ratio 1.8 0.0 - 3.2 ratio    Comment:                                     LDL/HDL Ratio                                              Men  Women                               1/2 Avg.Risk  1.0    1.5                                   Avg.Risk  3.6    3.2                                2X Avg.Risk  6.2    5.0                                3X Avg.Risk  8.0    6.1   VITAMIN D 25 Hydroxy (Vit-D Deficiency, Fractures)     Status: Abnormal   Collection Time: 06/12/21 10:36 AM  Result Value Ref Range   Vit D, 25-Hydroxy 14.9 (L) 30.0 - 100.0 ng/mL    Comment: Vitamin D deficiency has been defined by the [place] practice guideline as a level of serum 25-OH vitamin D less than 20 ng/mL (1,2). The Endocrine Society went on to further define vitamin D insufficiency as a level between 21 and 29 ng/mL (2). 1. IOM (Institute of Medicine). 2010. Dietary reference    intakes for calcium and D. [place]: The    [employer]. 2. Holick MF, Binkley Troy, Bischoff-Ferrari HA, et al.    Evaluation, treatment, and prevention of vitamin D    deficiency: an Endocrine Society clinical practice    guideline. JCEM. 2011 Jul; 96(7):1911-30.   B12 and Folate Panel     Status: None   Collection Time: 06/12/21 10:36 AM  Result Value Ref Range   Vitamin B-12 267 232 - 1,245 pg/mL   Folate 11.8 >3.0 ng/mL    Comment: A serum folate concentration of less than 3.1 ng/mL is considered to represent clinical deficiency.   UA/M w/rflx Culture, Routine     Status: None   Collection Time: 06/18/21 11:41  AM   Specimen: Urine   Urine  Result Value Ref Range   Specific Gravity, UA 1.016 1.005 - 1.030   pH, UA 7.0 5.0 - 7.5   Color, UA Yellow Yellow   Appearance Ur Clear Clear   Leukocytes,UA Negative Negative   Protein,UA Trace Negative/Trace   Glucose, UA Negative Negative   Ketones, UA Negative Negative   RBC, UA Negative Negative   Bilirubin, UA Negative Negative   Urobilinogen, Ur 1.0 0.2 - 1.0 mg/dL   Nitrite, UA Negative Negative   Microscopic Examination Comment      Comment: Microscopic follows if indicated.   Microscopic Examination See below:     Comment: Microscopic was indicated and was performed.   Urinalysis Reflex Comment     Comment: This specimen will not reflex to a Urine Culture.  Microscopic Examination     Status: None   Collection Time: 06/18/21 11:41 AM   Urine  Result Value Ref Range   WBC, UA 0-5 0 - 5 /hpf   RBC 0-2 0 - 2 /hpf   Epithelial Cells (non renal) 0-10 0 - 10 /hpf   Casts None seen None seen /lpf   Bacteria, UA Few None seen/Few        Assessment/Plan: 1. Encounter for general adult medical examination with abnormal findings CPE performed, routine fasting labs reviewed, mammogram ordered  2. Benign hypertension Stable, continue current medications  3. Lymphedema of both lower extremities Continue Lasix as needed and wearing compression stockings.  Followed by vascular  4. Stage 3a chronic kidney disease ([place]) Followed by nephrology  5. Diabetes mellitus with peripheral vascular disease ([place]) Followed by endocrinology  6. Mixed hyperlipidemia Stable, continue Lipitor  7. Vitamin D deficiency - ergocalciferol (DRISDOL) 1.25 MG (50000 UT) capsule; Take one cap q week  Dispense: 12 capsule; Refill: 3  8. B12 deficiency - cyanocobalamin ((VITAMIN B-12)) injection 1,000 mcg  9. Visit for screening mammogram - MM 3D SCREEN BREAST BILATERAL; Future  10. Dysuria - UA/M w/rflx Culture, Routine   General Counseling: Nykeria verbalizes understanding of the findings of todays visit and agrees with plan of treatment. I have discussed any further diagnostic evaluation that may be needed or ordered today. We also reviewed her medications today. she has been encouraged to call the office with any questions or concerns that should arise related to todays visit.    Counseling:    Orders Placed This Encounter  Procedures   Microscopic Examination   MM 3D SCREEN BREAST BILATERAL   UA/M w/rflx Culture, Routine     Meds ordered this encounter  Medications   ergocalciferol (DRISDOL) 1.25 MG (50000 UT) capsule    Sig: Take one cap q week    Dispense:  12 capsule    Refill:  3   cyanocobalamin ((VITAMIN B-12)) injection 1,000 mcg    This patient was seen by Drema Dallas, PA-C in collaboration with Dr. Clayborn Bigness as a part of collaborative care agreement.  Total time spent:35 Minutes  Time spent includes review of chart, medications, test results, and follow up plan with the patient.     Lavera Guise, MD  Internal Medicine

## 2021-06-19 LAB — UA/M W/RFLX CULTURE, ROUTINE
Bilirubin, UA: NEGATIVE
Glucose, UA: NEGATIVE
Ketones, UA: NEGATIVE
Leukocytes,UA: NEGATIVE
Nitrite, UA: NEGATIVE
RBC, UA: NEGATIVE
Specific Gravity, UA: 1.016 (ref 1.005–1.030)
Urobilinogen, Ur: 1 mg/dL (ref 0.2–1.0)
pH, UA: 7 (ref 5.0–7.5)

## 2021-06-19 LAB — MICROSCOPIC EXAMINATION: Casts: NONE SEEN /lpf

## 2021-06-20 ENCOUNTER — Encounter: Payer: Self-pay | Admitting: Endocrinology

## 2021-06-28 ENCOUNTER — Ambulatory Visit (INDEPENDENT_AMBULATORY_CARE_PROVIDER_SITE_OTHER): Payer: Medicare HMO

## 2021-06-28 DIAGNOSIS — E538 Deficiency of other specified B group vitamins: Secondary | ICD-10-CM

## 2021-06-28 MED ORDER — CYANOCOBALAMIN 1000 MCG/ML IJ SOLN
1000.0000 ug | Freq: Once | INTRAMUSCULAR | Status: AC
  Administered 2021-06-28: 1000 ug via INTRAMUSCULAR

## 2021-07-05 ENCOUNTER — Ambulatory Visit (INDEPENDENT_AMBULATORY_CARE_PROVIDER_SITE_OTHER): Payer: Medicare HMO

## 2021-07-05 DIAGNOSIS — E538 Deficiency of other specified B group vitamins: Secondary | ICD-10-CM | POA: Diagnosis not present

## 2021-07-05 MED ORDER — CYANOCOBALAMIN 1000 MCG/ML IJ SOLN
1000.0000 ug | Freq: Once | INTRAMUSCULAR | Status: AC
  Administered 2021-07-05: 1000 ug via INTRAMUSCULAR

## 2021-07-09 ENCOUNTER — Other Ambulatory Visit: Payer: Self-pay | Admitting: Endocrinology

## 2021-07-09 ENCOUNTER — Other Ambulatory Visit (INDEPENDENT_AMBULATORY_CARE_PROVIDER_SITE_OTHER): Payer: Medicare HMO

## 2021-07-09 DIAGNOSIS — E7800 Pure hypercholesterolemia, unspecified: Secondary | ICD-10-CM

## 2021-07-09 DIAGNOSIS — N183 Chronic kidney disease, stage 3 unspecified: Secondary | ICD-10-CM

## 2021-07-09 DIAGNOSIS — E1122 Type 2 diabetes mellitus with diabetic chronic kidney disease: Secondary | ICD-10-CM | POA: Diagnosis not present

## 2021-07-09 DIAGNOSIS — Z794 Long term (current) use of insulin: Secondary | ICD-10-CM

## 2021-07-09 LAB — LIPID PANEL
Cholesterol: 139 mg/dL (ref 0–200)
HDL: 41.7 mg/dL (ref >39.00)
LDL Cholesterol: 65 mg/dL (ref 0–99)
NonHDL: 97.48
Total CHOL/HDL Ratio: 3
Triglycerides: 164 mg/dL — ABNORMAL HIGH (ref 0.0–149.0)
VLDL: 32.8 mg/dL (ref 0.0–40.0)

## 2021-07-09 LAB — MICROALBUMIN / CREATININE URINE RATIO
Creatinine,U: 187.6 mg/dL
Microalb Creat Ratio: 5.2 mg/g (ref 0.0–30.0)
Microalb, Ur: 9.7 mg/dL — ABNORMAL HIGH (ref 0.0–1.9)

## 2021-07-09 LAB — HEMOGLOBIN A1C: Hgb A1c MFr Bld: 8.2 % — ABNORMAL HIGH (ref 4.6–6.5)

## 2021-07-09 LAB — GLUCOSE, RANDOM: Glucose, Bld: 198 mg/dL — ABNORMAL HIGH (ref 70–99)

## 2021-07-12 ENCOUNTER — Ambulatory Visit: Payer: Medicare HMO | Admitting: Endocrinology

## 2021-07-12 ENCOUNTER — Encounter: Payer: Self-pay | Admitting: Endocrinology

## 2021-07-12 VITALS — BP 138/82 | HR 80 | Ht 67.0 in | Wt 287.2 lb

## 2021-07-12 DIAGNOSIS — E1165 Type 2 diabetes mellitus with hyperglycemia: Secondary | ICD-10-CM

## 2021-07-12 DIAGNOSIS — Z794 Long term (current) use of insulin: Secondary | ICD-10-CM | POA: Diagnosis not present

## 2021-07-12 MED ORDER — EMPAGLIFLOZIN 10 MG PO TABS: 10.0000 mg | ORAL_TABLET | Freq: Every day | ORAL | 3 refills | Status: DC

## 2021-07-12 NOTE — Progress Notes (Addendum)
Patient ID: Bethany [name], female   DOB: 1945/06/11, 76 y.o.   MRN: [id number]            Reason for Appointment: Type II Diabetes follow-up   History of Present Illness   Diagnosis date: 1998  Previous history:  Non-insulin hypoglycemic drugs previously used:metformin years ago, Ozempic Insulin was started in 2009 Previously has used NPH and regular insulin separately, no other insulin recently  A1c range in the last few years is: 7.8-10.3  Recent history:     Non-insulin hypoglycemic drugs: Ozempic 0.25 mg weekly      Insulin regimen: Novolin 70/30, 40 units in a.m.          Side effects from medications: Possibly diarrhea from metformin  Current self management, blood sugar patterns and problems identified:  A1c is 8.2 compared to 7.8 previously Ozempic was started when her A1c had gone up to 10.2 in January Although she has likely lost weight from Ozempic she thinks it is decreasing her appetite significantly and she has less desire to eat Her meals are somewhat irregular also with variable intake Although she has used the freestyle libre sensor she has not used it consistently and only a few days of data is available and some days are incomplete She has inconsistent blood sugars from day-to-day on her sensor and and does not remember to check her sugars in the evenings usually Generally lowest blood sugars are in the late afternoon  Exercise: Minimal  Diet management: Not following any meal plan     Hypoglycemia: None  Analysis of her freestyle libre sensor download as follows  Blood sugar data is available between 6/24 and 6/29 although not complete every day She has HYPERGLYCEMIC episodes inconsistently either late evening and overnight or late morning OVERNIGHT blood sugars are generally consistently high but somewhat lower on 2 of the nights without hypoglycemia POSTPRANDIAL readings are mostly evaluated AFTER breakfast but usually blood sugars are showing  significant increase Blood sugars are inconsistent after lunch and dinner and appeared to be lower significantly after lunch on 6/27 No hypoglycemia  Data for the available dates as follows  CGM use % of time   2-week average/GV 167  Time in range 65       %  % Time Above 180 30  % Time above 250 5  % Time Below 70 0     PRE-MEAL Fasting Lunch Dinner Bedtime Overall  Glucose range:       Averages: 171  105  167   POST-MEAL PC Breakfast PC Lunch PC Dinner  Glucose range:     Averages: 208  211     Dietician visit: Most recent: years ago     Weight control:  Wt Readings from Last 3 Encounters:  07/12/21 287 lb 3.2 oz (130.3 kg)  06/18/21 286 lb 12.8 oz (130.1 kg)  04/03/21 288 lb 9.6 oz (130.9 kg)            Diabetes labs:  Lab Results  Component Value Date   HGBA1C 8.2 (H) 07/09/2021   HGBA1C 7.8 (A) 04/03/2021   HGBA1C 10.2 (A) 01/31/2021   Lab Results  Component Value Date   MICROALBUR 9.7 (H) 07/09/2021   LDLCALC 65 07/09/2021   CREATININE 1.01 (H) 06/12/2021     Allergies as of 07/12/2021   No Known Allergies      Medication List        Accurate as of July 12, 2021  4:12 PM. If  you have any questions, ask your nurse or doctor.          STOP taking these medications    naproxen 500 MG tablet Commonly known as: NAPROSYN Stopped by: Reather Littler, MD   naproxen sodium 220 MG tablet Commonly known as: ALEVE Stopped by: Reather Littler, MD       TAKE these medications    amLODipine 10 MG tablet Commonly known as: NORVASC TAKE 1 TABLET (10 MG TOTAL) BY MOUTH DAILY.   aspirin 81 MG tablet Take 81 mg by mouth daily. Reported on 03/29/2015   atorvastatin 20 MG tablet Commonly known as: LIPITOR TAKE 1 TABLET EVERY DAY   Dexcom G6 Sensor Misc 1 Device by Does not apply route See admin instructions. Change every 10 days   diclofenac Sodium 1 % Gel Commonly known as: VOLTAREN Voltaren 1 % topical gel  APPLY 2 GRAMS TO THE AFFECTED AREA(S)  BY TOPICAL ROUTE 4 TIMES PER DAY   docusate sodium 100 MG capsule Commonly known as: COLACE Take 100 mg by mouth daily as needed for mild constipation. Reported on 03/29/2015   Droplet Insulin Syringe 30G X 1/2" 1 ML Misc Generic drug: Insulin Syringe-Needle U-100 USE TO INJECT INSULIN EVERY DAY WITH BREAKFAST   DropSafe Alcohol Prep 70 % Pads USE TWICE DAILY   empagliflozin 10 MG Tabs tablet Commonly known as: Jardiance Take 1 tablet (10 mg total) by mouth daily with breakfast. Started by: Reather Littler, MD   ergocalciferol 1.25 MG (50000 UT) capsule Commonly known as: Drisdol Take one cap q week   FISH OIL PO Take 2,400 mg by mouth daily.   furosemide 20 MG tablet Commonly known as: LASIX TAKE 1 TABLET EVERY DAY AS NEEDED   losartan 50 MG tablet Commonly known as: COZAAR TAKE 1 TABLET EVERY DAY   metoprolol succinate 50 MG 24 hr tablet Commonly known as: TOPROL-XL TAKE 1 TABLET EVERY DAY TAKE WITH OR IMMEDIATELY FOLLOWING A MEAL   NovoLIN 70/30 (70-30) 100 UNIT/ML injection Generic drug: insulin NPH-regular Human Inject 40 Units into the skin daily with breakfast.   omeprazole 20 MG capsule Commonly known as: PRILOSEC TAKE 1 CAPSULE EVERY DAY   Ozempic (0.25 or 0.5 MG/DOSE) 2 MG/1.5ML Sopn Generic drug: Semaglutide(0.25 or 0.5MG /DOS) Inject 0.25 mg into the skin once a week.   triamcinolone cream 0.1 % Commonly known as: KENALOG APPLY TOPICALLY 4 (FOUR) TIMES DAILY AS NEEDED FOR ITCHING   True Metrix Blood Glucose Test test strip Generic drug: glucose blood 1 each by Other route 4 (four) times daily.   True Metrix Meter Devi by Does not apply route.        Allergies: No Known Allergies  Past Medical History:  Diagnosis Date   Arthritis    Chronic kidney disease    Diabetes mellitus without complication (HCC)    Type II   Fibromyalgia    GERD (gastroesophageal reflux disease)    better after gall bladder surgery-    Hyperlipidemia     Hypertension    Shortness of breath dyspnea    with exertion    Past Surgical History:  Procedure Laterality Date   ABDOMINAL HYSTERECTOMY  01/15/1995   CATARACT EXTRACTION Bilateral 01/15/2012   CHOLECYSTECTOMY  01/15/2008   COLONOSCOPY     COLONOSCOPY WITH PROPOFOL N/A 04/21/2015   Procedure: COLONOSCOPY WITH PROPOFOL;  Surgeon: Wallace Cullens, MD;  Location: [facility] ENDOSCOPY;  Service: Gastroenterology;  Laterality: N/A;   EYE SURGERY Bilateral    Cataract  LUMBAR WOUND DEBRIDEMENT N/A 10/20/2014   Procedure: LUMBAR WOUND DEBRIDEMENT;  Surgeon: Tia Alert, MD;  Location: [facility]UR[facility];  Service: Neurosurgery;  Laterality: N/A;   MAXIMUM ACCESS (MAS)POSTERIOR LUMBAR INTERBODY FUSION (PLIF) 1 LEVEL N/A 09/16/2014   Procedure: L/4-5 FOR MAXIMUM ACCESS (MAS) POSTERIOR LUMBAR INTERBODY FUSION (PLIF) 1 LEVEL;  Surgeon: Tia Alert, MD;  Location: [facility]UR[facility] OR[facility];  Service: Neurosurgery;  Laterality: N/A;  L/4-5 FOR MAXIMUM ACCESS (MAS) POSTERIOR LUMBAR INTERBODY FUSION (PLIF) 1 LEVEL   REFRACTIVE SURGERY Left     Family History  Problem Relation Age of Onset   Arthritis Mother    Cancer Mother        Ovarian   Diabetes Mother    Heart attack Father    Heart disease Father    Hypertension Father    Cancer Brother    Diabetes Brother     Social History:  reports that she has never smoked. She has never used smokeless tobacco. She reports that she does not drink alcohol and does not use drugs.  Review of Systems:  Last diabetic eye exam date 2/22  Last foot exam date: 10/2020  Symptoms of neuropathy: Some numbness present in her feet  Hypertension:   Treatment includes Losartan 50 mg daily   BP Readings from Last 3 Encounters:  07/12/21 138/82  06/18/21 (!) 142/67  04/03/21 (!) 152/78   Takes Lasix only occasionally as she does not have significant edema No recent change in renal function  Lab Results  Component Value Date   CREATININE 1.01 (H) 06/12/2021   CREATININE  1.21 (H) 01/27/2020   CREATININE 1.11 08/26/2019    Lipid management: Taking 20 mg Lipitor    Lab Results  Component Value Date   CHOL 139 07/09/2021   CHOL 139 06/12/2021   CHOL 126 01/27/2020   Lab Results  Component Value Date   HDL 41.70 07/09/2021   HDL 41 06/12/2021   HDL 49.90 01/27/2020   Lab Results  Component Value Date   LDLCALC 65 07/09/2021   LDLCALC 72 06/12/2021   LDLCALC 52 01/27/2020   Lab Results  Component Value Date   TRIG 164.0 (H) 07/09/2021   TRIG 151 (H) 06/12/2021   TRIG 118.0 01/27/2020   Lab Results  Component Value Date   CHOLHDL 3 07/09/2021   CHOLHDL 3 01/27/2020   CHOLHDL 3 06/29/2018   No results found for: "LDLDIRECT"   Examination:   BP 138/82   Pulse 80   Ht 5\' 7"  (1.702 m)   Wt 287 lb 3.2 oz (130.3 kg)   SpO2 97%   BMI 44.98 kg/m   Body mass index is 44.98 kg/m.    ASSESSMENT/ PLAN:    Diabetes type 2 with severe obesity:   Current regimen: Ozempic 0.25 mg weekly, Novolin 70/30 40 units in a.m. only  See history of present illness for detailed discussion of current diabetes management, blood sugar patterns and problems identified  A1c is consistently high and now 8.2, relatively higher  Blood glucose control is suboptimal and difficult to assess because of inadequate glucose monitoring recently despite using the freestyle libre sensor  She still likely has significant postprandial hyperglycemia at times but variable readings with tendency to low normal or low readings late afternoon from her once a day premixed insulin Also has inadequate control of her postprandial readings after breakfast and dinner as judged by recent data She does need to lose significant amount of weight but likely unable  to tolerate any higher doses of OZEMPIC  Recommendations:  She does need to check blood sugars 4 times a day and discussed this will help [place] analyze her blood sugar patterns and assess for insulin adjustments and  recommending the proper type of insulin to be used for optimal control Meanwhile we will change her insulin to 30 units in the morning and 10 units before dinnertime May be able to tolerate oral Rybelsus better than injectable Ozempic and she can try a sample of 3 mg for the first 30 days After 30 days if she is has no side effects we will send her prescription for 7 mg However unlikely that she can tolerate the maximum dose Also needs to to add an SGLT2 drug for various benefits including blood sugar control, weight loss, more optimal blood pressure control and long-term benefits with cardiovascular and renal outcomes especially with history of CHF She will start with 10 mg of Jardiance Discussed action of SGLT 2 drugs on lowering glucose by decreasing kidney absorption of glucose, benefits of weight loss and lower blood pressure, possible side effects including candidiasis and dosage regimen  Reminded her to check blood pressure regularly and as a precaution reduce amlodipine to half tablet DIABETES education: She needs to be evaluated by diabetes educator for comprehensive education and follow-up of change in her treatment regimen from today and adjustment of insulin in the interim We will also follow-up renal function on the next visit  LIPIDS: Well-controlled with LDL below 70 but slight increase in triglycerides She will continue 20 mg of atorvastatin  Patient Instructions  Rybelsus improves blood sugar control as well as can help with weight loss and reduces cardiovascular events. Need to take the capsules on empty stomach 30 minutes before breakfast with 4 ounces of water daily.   You may feel more fullness at mealtimes and try to keep portions at meals smaller Some people may have nausea or even vomiting that may occur in the first few days; usually the symptoms go away with time. Please call if nausea continues  Call for Rx  Jardiance for sugar, BP  Reduce amlodipine to 1/2 and  check BP  Take 30 units insulin Before Bf and 10 before supper  Call if sugar low often  Check sugar 4x daily   Total visit time for evaluation management of endocrine related problems, diabetes and related issues, coordination of care, review of labs and extensive related medical records, detailed counseling regarding diabetes management and monitoring = 40 minutes  Reather Littler 07/12/2021, 4:12 PM

## 2021-07-12 NOTE — Patient Instructions (Addendum)
Rybelsus improves blood sugar control as well as can help with weight loss and reduces cardiovascular events. Need to take the capsules on empty stomach 30 minutes before breakfast with 4 ounces of water daily.   You may feel more fullness at mealtimes and try to keep portions at meals smaller Some people may have nausea or even vomiting that may occur in the first few days; usually the symptoms go away with time. Please call if nausea continues  Call for Rx  Jardiance for sugar, BP  Reduce amlodipine to 1/2 and check BP  Take 30 units insulin Before Bf and 10 before supper  Call if sugar low often  Check sugar 4x daily

## 2021-07-13 ENCOUNTER — Ambulatory Visit (INDEPENDENT_AMBULATORY_CARE_PROVIDER_SITE_OTHER): Payer: Medicare HMO | Admitting: Nurse Practitioner

## 2021-07-13 ENCOUNTER — Ambulatory Visit
Admission: RE | Admit: 2021-07-13 | Discharge: 2021-07-13 | Disposition: A | Discharge status: Home / Self Care | Payer: Medicare HMO | Source: Ambulatory Visit | Attending: Physician Assistant | Admitting: Physician Assistant

## 2021-07-13 DIAGNOSIS — Z1231 Encounter for screening mammogram for malignant neoplasm of breast: Secondary | ICD-10-CM | POA: Diagnosis not present

## 2021-07-23 ENCOUNTER — Telehealth: Payer: Self-pay

## 2021-07-23 DIAGNOSIS — E1165 Type 2 diabetes mellitus with hyperglycemia: Secondary | ICD-10-CM

## 2021-07-23 MED ORDER — DAPAGLIFLOZIN PROPANEDIOL 5 MG PO TABS: 5.0000 mg | ORAL_TABLET | Freq: Every day | ORAL | 1 refills | Status: DC

## 2021-07-23 NOTE — Telephone Encounter (Signed)
Patient called in states she is having a reaction to the jardiance medication. States she is itching and has rash on upper biceps. Also, her hands are very itchy as well.

## 2021-07-24 ENCOUNTER — Ambulatory Visit (INDEPENDENT_AMBULATORY_CARE_PROVIDER_SITE_OTHER): Payer: Medicare HMO | Admitting: Nurse Practitioner

## 2021-07-24 ENCOUNTER — Encounter (INDEPENDENT_AMBULATORY_CARE_PROVIDER_SITE_OTHER): Payer: Self-pay | Admitting: Nurse Practitioner

## 2021-07-24 VITALS — BP 138/75 | HR 81 | Resp 16 | Wt 287.8 lb

## 2021-07-24 DIAGNOSIS — N1831 Chronic kidney disease, stage 3a: Secondary | ICD-10-CM

## 2021-07-24 DIAGNOSIS — N183 Chronic kidney disease, stage 3 unspecified: Secondary | ICD-10-CM | POA: Diagnosis not present

## 2021-07-24 DIAGNOSIS — Z794 Long term (current) use of insulin: Secondary | ICD-10-CM

## 2021-07-24 DIAGNOSIS — I5032 Chronic diastolic (congestive) heart failure: Secondary | ICD-10-CM

## 2021-07-24 DIAGNOSIS — E1122 Type 2 diabetes mellitus with diabetic chronic kidney disease: Secondary | ICD-10-CM | POA: Diagnosis not present

## 2021-07-24 DIAGNOSIS — I890 Lymphedema, not elsewhere classified: Secondary | ICD-10-CM

## 2021-08-02 ENCOUNTER — Ambulatory Visit (INDEPENDENT_AMBULATORY_CARE_PROVIDER_SITE_OTHER): Payer: Medicare HMO

## 2021-08-02 DIAGNOSIS — E538 Deficiency of other specified B group vitamins: Secondary | ICD-10-CM

## 2021-08-02 MED ORDER — CYANOCOBALAMIN 1000 MCG/ML IJ SOLN
1000.0000 ug | Freq: Once | INTRAMUSCULAR | Status: AC
  Administered 2021-08-02: 1000 ug via INTRAMUSCULAR

## 2021-08-05 ENCOUNTER — Encounter (INDEPENDENT_AMBULATORY_CARE_PROVIDER_SITE_OTHER): Payer: Self-pay | Admitting: Nurse Practitioner

## 2021-08-05 NOTE — Progress Notes (Signed)
Subjective:    Patient ID: Bethany [name], female    DOB: [birth year], 76 y.o.   MRN: [id number] Chief Complaint  Patient presents with   Follow-up    6 month follow up    The patient returns to the office for followup evaluation regarding leg swelling.  The swelling has persisted and the pain associated with swelling continues. There have not been any interval development of a ulcerations or wounds.  Since the previous visit the patient has not been wearing graduated compression stockings and has noted little if any improvement in the lymphedema. The patient has not been using compression routinely morning until night.  She has a lymphedema pump and has not been utilizing it due to discomfort.  The patient also states elevation during the day and exercise is being done too.       Review of Systems  Cardiovascular:  Positive for leg swelling.  Skin:  Negative for wound.  All other systems reviewed and are negative.      Objective:   Physical Exam Vitals reviewed.  HENT:     Head: Normocephalic.  Cardiovascular:     Rate and Rhythm: Normal rate.     Pulses: Normal pulses.  Pulmonary:     Effort: Pulmonary effort is normal.  Musculoskeletal:     Right lower leg: 2+ Edema present.     Left lower leg: 2+ Edema present.  Skin:    General: Skin is warm and dry.  Neurological:     Mental Status: She is alert and oriented to person, place, and time.  Psychiatric:        Mood and Affect: Mood normal.        Behavior: Behavior normal.        Thought Content: Thought content normal.        Judgment: Judgment normal.     BP 138/75 (BP Location: Right Arm)   Pulse 81   Resp 16   Wt 287 lb 12.8 oz (130.5 kg)   BMI 45.08 kg/m   Past Medical History:  Diagnosis Date   Arthritis    Chronic kidney disease    Diabetes mellitus without complication (HCC)    Type II   Fibromyalgia    GERD (gastroesophageal reflux disease)    better after gall bladder surgery-     Hyperlipidemia    Hypertension    Shortness of breath dyspnea    with exertion    Social History   Socioeconomic History   Marital status: Widowed    Spouse name: Not on file   Number of children: Not on file   Years of education: Not on file   Highest education level: Not on file  Occupational History   Not on file  Tobacco Use   Smoking status: Never   Smokeless tobacco: Never  Vaping Use   Vaping Use: Never used  Substance and Sexual Activity   Alcohol use: No   Drug use: No   Sexual activity: Not Currently  Other Topics Concern   Not on file  Social History Narrative   Not on file   Social Determinants of Health   Financial Resource Strain: Not on file  Food Insecurity: Not on file  Transportation Needs: Not on file  Physical Activity: Not on file  Stress: Not on file  Social Connections: Not on file  Intimate Partner Violence: Not on file    Past Surgical History:  Procedure Laterality Date   ABDOMINAL HYSTERECTOMY  01/15/1995  CATARACT EXTRACTION Bilateral 01/15/2012   CHOLECYSTECTOMY  01/15/2008   COLONOSCOPY     COLONOSCOPY WITH PROPOFOL N/A 04/21/2015   Procedure: COLONOSCOPY WITH PROPOFOL;  Surgeon: Wallace Cullens, MD;  Location: [facility] ENDOSCOPY;  Service: Gastroenterology;  Laterality: N/A;   EYE SURGERY Bilateral    Cataract   LUMBAR WOUND DEBRIDEMENT N/A 10/20/2014   Procedure: LUMBAR WOUND DEBRIDEMENT;  Surgeon: Tia Alert, MD;  Location: [facility]UR[facility];  Service: Neurosurgery;  Laterality: N/A;   MAXIMUM ACCESS (MAS)POSTERIOR LUMBAR INTERBODY FUSION (PLIF) 1 LEVEL N/A 09/16/2014   Procedure: L/4-5 FOR MAXIMUM ACCESS (MAS) POSTERIOR LUMBAR INTERBODY FUSION (PLIF) 1 LEVEL;  Surgeon: Tia Alert, MD;  Location: [facility]UR[facility];  Service: Neurosurgery;  Laterality: N/A;  L/4-5 FOR MAXIMUM ACCESS (MAS) POSTERIOR LUMBAR INTERBODY FUSION (PLIF) 1 LEVEL   REFRACTIVE SURGERY Left     Family History  Problem Relation Age of Onset   Arthritis Mother     Cancer Mother        Ovarian   Diabetes Mother    Heart attack Father    Heart disease Father    Hypertension Father    Cancer Brother    Diabetes Brother     No Known Allergies     Latest Ref Rng & Units 06/12/2021   10:36 AM 01/27/2020    4:18 PM 06/29/2018   12:31 PM  CBC  WBC 3.4 - 10.8 x10E3/uL 8.0  8.6  7.3   Hemoglobin 11.1 - 15.9 g/dL 07.3  71.0  62.6   Hematocrit 34.0 - 46.6 % 39.1  37.7  38.0   Platelets 150 - 450 x10E3/uL 315  270.0  279.0       CMP     Component Value Date/Time   NA 144 06/12/2021 1036   K 4.8 06/12/2021 1036   CL 103 06/12/2021 1036   CO2 23 06/12/2021 1036   GLUCOSE 198 (H) 07/09/2021 1119   BUN 18 06/12/2021 1036   CREATININE 1.01 (H) 06/12/2021 1036   CALCIUM 9.8 06/12/2021 1036   PROT 7.4 06/12/2021 1036   ALBUMIN 4.2 06/12/2021 1036   AST 12 06/12/2021 1036   ALT 12 06/12/2021 1036   ALKPHOS 164 (H) 06/12/2021 1036   BILITOT 0.3 06/12/2021 1036   GFRNONAA 55 (L) 10/20/2014 1039   GFRAA >60 10/20/2014 1039     No results found.     Assessment & Plan:   1. Lymphedema Discussed the importance of adhering to conservative therapy with the patient.  She has a lymphedema pump however she does not utilize it due to discomfort.  We discussed that if she is able to utilize it for long periods it may be more tolerable for shorter timeframes.  We also discussed the importance of utilizing medical grade compression to help with day-to-day control of lymphedema.  Patient is also advised to continue with lower extremity elevation.  Patient return in 6 months or sooner if she begins to have issues with blisters open wounds or ulcerations.  2. Chronic kidney disease, stage 3a (HCC) Contributes to lower extremity edema.  3. CHF (congestive heart failure), NYHA class I, chronic, diastolic (HCC) Plays a component of the patient's lower extremity edema  4. Type 2 diabetes mellitus with stage 3 chronic kidney disease, with long-term current use  of insulin, unspecified whether stage 3a or 3b CKD (HCC) Continue hypoglycemic medications as already ordered, these medications have been reviewed and there are no changes at this time.  Hgb A1C to  be monitored as already arranged by primary service    Current Outpatient Medications on File Prior to Visit  Medication Sig Dispense Refill   Alcohol Swabs (DROPSAFE ALCOHOL PREP) 70 % PADS USE TWICE DAILY 200 each 2   amLODipine (NORVASC) 10 MG tablet TAKE 1 TABLET (10 MG TOTAL) BY MOUTH DAILY. 90 tablet 1   aspirin 81 MG tablet Take 81 mg by mouth daily. Reported on 03/29/2015     atorvastatin (LIPITOR) 20 MG tablet TAKE 1 TABLET EVERY DAY 90 tablet 1   Blood Glucose Monitoring Suppl (TRUE METRIX METER) DEVI by Does not apply route.     dapagliflozin propanediol (FARXIGA) 5 MG TABS tablet Take 1 tablet (5 mg total) by mouth daily before breakfast. 30 tablet 1   diclofenac Sodium (VOLTAREN) 1 % GEL Voltaren 1 % topical gel  APPLY 2 GRAMS TO THE AFFECTED AREA(S) BY TOPICAL ROUTE 4 TIMES PER DAY     docusate sodium (COLACE) 100 MG capsule Take 100 mg by mouth daily as needed for mild constipation. Reported on 03/29/2015     DROPLET INSULIN SYRINGE 30G X 1/2" 1 ML MISC USE TO INJECT INSULIN EVERY DAY WITH BREAKFAST 100 each 2   empagliflozin (JARDIANCE) 10 MG TABS tablet Take 1 tablet (10 mg total) by mouth daily with breakfast. 30 tablet 3   ergocalciferol (DRISDOL) 1.25 MG (50000 UT) capsule Take one cap q week 12 capsule 3   furosemide (LASIX) 20 MG tablet TAKE 1 TABLET EVERY DAY AS NEEDED 90 tablet 0   glucose blood (TRUE METRIX BLOOD GLUCOSE TEST) test strip 1 each by Other route 4 (four) times daily. 400 each 1   insulin NPH-regular Human (NOVOLIN 70/30) (70-30) 100 UNIT/ML injection Inject 40 Units into the skin daily with breakfast. 50 mL 2   losartan (COZAAR) 50 MG tablet TAKE 1 TABLET EVERY DAY 90 tablet 1   metoprolol succinate (TOPROL-XL) 50 MG 24 hr tablet TAKE 1 TABLET EVERY DAY TAKE  WITH OR IMMEDIATELY FOLLOWING A MEAL 90 tablet 3   Omega-3 Fatty Acids (FISH OIL PO) Take 2,400 mg by mouth daily.     omeprazole (PRILOSEC) 20 MG capsule TAKE 1 CAPSULE EVERY DAY 90 capsule 1   triamcinolone cream (KENALOG) 0.1 % APPLY TOPICALLY 4 (FOUR) TIMES DAILY AS NEEDED FOR ITCHING 80 g 2   Continuous Blood Gluc Sensor (DEXCOM G6 SENSOR) MISC 1 Device by Does not apply route See admin instructions. Change every 10 days (Patient not taking: Reported on 07/12/2021) 9 each 3   Semaglutide,0.25 or 0.5MG /DOS, (OZEMPIC, 0.25 OR 0.5 MG/DOSE,) 2 MG/1.5ML SOPN Inject 0.25 mg into the skin once a week. (Patient not taking: Reported on 07/24/2021) 4.5 mL 3   No current facility-administered medications on file prior to visit.    There are no Patient Instructions on file for this visit. No follow-ups on file.   Kris Hartmann, NP

## 2021-08-17 ENCOUNTER — Telehealth: Payer: Self-pay

## 2021-08-17 NOTE — Telephone Encounter (Signed)
Pt called c/o having scratchy throat x 2 days and she has white spots on the left side of tonsils feels like something in her throat but not sore.  Also has runny nose and drainage x 1 week.  Pt did take a covid test and was negative.  Per [facility] advised pt to do warm gargles, Flonase nasal spray, and Claritin  and if needed for drainage to use mucinex. Advised pt that if no better next week to call and we can get her either a virtual or a office visit Pt understood.

## 2021-08-30 ENCOUNTER — Ambulatory Visit (INDEPENDENT_AMBULATORY_CARE_PROVIDER_SITE_OTHER): Payer: Medicare HMO

## 2021-08-30 DIAGNOSIS — E538 Deficiency of other specified B group vitamins: Secondary | ICD-10-CM

## 2021-08-30 MED ORDER — CYANOCOBALAMIN 1000 MCG/ML IJ SOLN
1000.0000 ug | Freq: Once | INTRAMUSCULAR | Status: AC
  Administered 2021-08-30: 1000 ug via INTRAMUSCULAR

## 2021-09-21 ENCOUNTER — Encounter: Payer: Self-pay | Admitting: Physician Assistant

## 2021-09-21 ENCOUNTER — Ambulatory Visit (INDEPENDENT_AMBULATORY_CARE_PROVIDER_SITE_OTHER): Payer: Medicare HMO | Admitting: Physician Assistant

## 2021-09-21 DIAGNOSIS — E1151 Type 2 diabetes mellitus with diabetic peripheral angiopathy without gangrene: Secondary | ICD-10-CM

## 2021-09-21 DIAGNOSIS — G8929 Other chronic pain: Secondary | ICD-10-CM

## 2021-09-21 DIAGNOSIS — E538 Deficiency of other specified B group vitamins: Secondary | ICD-10-CM | POA: Diagnosis not present

## 2021-09-21 DIAGNOSIS — I10 Essential (primary) hypertension: Secondary | ICD-10-CM | POA: Diagnosis not present

## 2021-09-21 DIAGNOSIS — I890 Lymphedema, not elsewhere classified: Secondary | ICD-10-CM

## 2021-09-21 DIAGNOSIS — M5441 Lumbago with sciatica, right side: Secondary | ICD-10-CM | POA: Diagnosis not present

## 2021-09-21 MED ORDER — GABAPENTIN 100 MG PO CAPS: 100.0000 mg | ORAL_CAPSULE | Freq: Three times a day (TID) | ORAL | 3 refills | Status: AC

## 2021-09-21 MED ORDER — CYANOCOBALAMIN 1000 MCG/ML IJ SOLN
1000.0000 ug | Freq: Once | INTRAMUSCULAR | Status: AC
  Administered 2021-09-21: 1000 ug via INTRAMUSCULAR

## 2021-09-21 NOTE — Progress Notes (Signed)
[facility] [address] [place], [place] [address]  Internal MEDICINE  Office Visit Note  Patient Name: Bethany [name]  [id number]  [id number]  Date of Service: 09/28/2021  Chief Complaint  Patient presents with   Follow-up   Diabetes   Hypertension   Hyperlipidemia   Gastroesophageal Reflux   Quality Metric Gaps    Shingles and Pneumonia vaccines    HPI Pt is here for routine follow up -She was a little lightheaded the past week, no passing out, not drinking any water and also hasnt been eating like she should though. Sometimes doesn't eat until 2 or 3 pm. Not wearing glucose sensor today because time to change it and states it beeps at her with low readings frequently. She is going to discuss with endo next week. Her insulin is being changed. She reports lows at night. -BP 124/57, HR in 60s at home -Stopped ozempic due to GI and itching and switched to farxiga, still itching though -right side sciatic pain, used to see ortho for shots but these didn't really help. Doesn't want to go back to this if possible. Has done PT in the past for this as well. Did discuss trying low dose gabapentin, but advised to hold off until not feeling lightheaded and diabetic meds straightened out to avoid adding potential grogginess to this. -just started back on lasix to help fluid and swelling in ankles, just couple days back on it  Current Medication: Outpatient Encounter Medications as of 09/21/2021  Medication Sig   Alcohol Swabs (DROPSAFE ALCOHOL PREP) 70 % PADS USE TWICE DAILY   amLODipine (NORVASC) 10 MG tablet TAKE 1 TABLET (10 MG TOTAL) BY MOUTH DAILY.   aspirin 81 MG tablet Take 81 mg by mouth daily. Reported on 03/29/2015   atorvastatin (LIPITOR) 20 MG tablet TAKE 1 TABLET EVERY DAY   Blood Glucose Monitoring Suppl (TRUE METRIX METER) DEVI by Does not apply route.   Continuous Blood Gluc Sensor (DEXCOM G6 SENSOR) MISC 1 Device by Does not apply route See admin instructions.  Change every 10 days   dapagliflozin propanediol (FARXIGA) 5 MG TABS tablet Take 1 tablet (5 mg total) by mouth daily before breakfast.   diclofenac Sodium (VOLTAREN) 1 % GEL Voltaren 1 % topical gel  APPLY 2 GRAMS TO THE AFFECTED AREA(S) BY TOPICAL ROUTE 4 TIMES PER DAY   docusate sodium (COLACE) 100 MG capsule Take 100 mg by mouth daily as needed for mild constipation. Reported on 03/29/2015   DROPLET INSULIN SYRINGE 30G X 1/2" 1 ML MISC USE TO INJECT INSULIN EVERY DAY WITH BREAKFAST   ergocalciferol (DRISDOL) 1.25 MG (50000 UT) capsule Take one cap q week   furosemide (LASIX) 20 MG tablet TAKE 1 TABLET EVERY DAY AS NEEDED   gabapentin (NEURONTIN) 100 MG capsule Take 1 capsule (100 mg total) by mouth 3 (three) times daily.   glucose blood (TRUE METRIX BLOOD GLUCOSE TEST) test strip 1 each by Other route 4 (four) times daily.   insulin NPH-regular Human (NOVOLIN 70/30) (70-30) 100 UNIT/ML injection Inject 40 Units into the skin daily with breakfast.   losartan (COZAAR) 50 MG tablet TAKE 1 TABLET EVERY DAY   metoprolol succinate (TOPROL-XL) 50 MG 24 hr tablet TAKE 1 TABLET EVERY DAY TAKE WITH OR IMMEDIATELY FOLLOWING A MEAL   Omega-3 Fatty Acids (FISH OIL PO) Take 2,400 mg by mouth daily.   omeprazole (PRILOSEC) 20 MG capsule TAKE 1 CAPSULE EVERY DAY   triamcinolone cream (KENALOG) 0.1 %  APPLY TOPICALLY 4 (FOUR) TIMES DAILY AS NEEDED FOR ITCHING   [DISCONTINUED] empagliflozin (JARDIANCE) 10 MG TABS tablet Take 1 tablet (10 mg total) by mouth daily with breakfast.   [DISCONTINUED] Semaglutide,0.25 or 0.5MG /DOS, (OZEMPIC, 0.25 OR 0.5 MG/DOSE,) 2 MG/1.5ML SOPN Inject 0.25 mg into the skin once a week.   [EXPIRED] cyanocobalamin (VITAMIN B12) injection 1,000 mcg    No facility-administered encounter medications on file as of 09/21/2021.    Surgical History: Past Surgical History:  Procedure Laterality Date   ABDOMINAL HYSTERECTOMY  01/15/1995   CATARACT EXTRACTION Bilateral 01/15/2012    CHOLECYSTECTOMY  01/15/2008   COLONOSCOPY     COLONOSCOPY WITH PROPOFOL N/A 04/21/2015   Procedure: COLONOSCOPY WITH PROPOFOL;  Surgeon: Wallace Cullens, MD;  Location: [facility];  Service: Gastroenterology;  Laterality: N/A;   EYE SURGERY Bilateral    Cataract   LUMBAR WOUND DEBRIDEMENT N/A 10/20/2014   Procedure: LUMBAR WOUND DEBRIDEMENT;  Surgeon: Tia Alert, MD;  Location: [facility] NEURO ORS;  Service: Neurosurgery;  Laterality: N/A;   MAXIMUM ACCESS (MAS)POSTERIOR LUMBAR INTERBODY FUSION (PLIF) 1 LEVEL N/A 09/16/2014   Procedure: L/4-5 FOR MAXIMUM ACCESS (MAS) POSTERIOR LUMBAR INTERBODY FUSION (PLIF) 1 LEVEL;  Surgeon: Tia Alert, MD;  Location: [facility] NEURO ORS;  Service: Neurosurgery;  Laterality: N/A;  L/4-5 FOR MAXIMUM ACCESS (MAS) POSTERIOR LUMBAR INTERBODY FUSION (PLIF) 1 LEVEL   REFRACTIVE SURGERY Left     Medical History: Past Medical History:  Diagnosis Date   Arthritis    Chronic kidney disease    Diabetes mellitus without complication (HCC)    Type II   Fibromyalgia    GERD (gastroesophageal reflux disease)    better after gall bladder surgery-    Hyperlipidemia    Hypertension    Shortness of breath dyspnea    with exertion    Family History: Family History  Problem Relation Age of Onset   Arthritis Mother    Cancer Mother        Ovarian   Diabetes Mother    Heart attack Father    Heart disease Father    Hypertension Father    Cancer Brother    Diabetes Brother     Social History   Socioeconomic History   Marital status: Widowed    Spouse name: Not on file   Number of children: Not on file   Years of education: Not on file   Highest education level: Not on file  Occupational History   Not on file  Tobacco Use   Smoking status: Never   Smokeless tobacco: Never  Vaping Use   Vaping Use: Never used  Substance and Sexual Activity   Alcohol use: No   Drug use: No   Sexual activity: Not Currently  Other Topics Concern   Not on file  Social History  Narrative   Not on file   Social Determinants of Health   Financial Resource Strain: Not on file  Food Insecurity: Not on file  Transportation Needs: Not on file  Physical Activity: Not on file  Stress: Not on file  Social Connections: Not on file  Intimate Partner Violence: Not on file      Review of Systems  Constitutional:  Negative for chills, fatigue and unexpected weight change.  HENT:  Positive for postnasal drip. Negative for congestion, rhinorrhea, sneezing and sore throat.   Eyes:  Negative for redness.  Respiratory:  Negative for cough, chest tightness and shortness of breath.   Cardiovascular:  Positive for leg  swelling. Negative for chest pain and palpitations.  Gastrointestinal:  Negative for abdominal pain, constipation, diarrhea, nausea and vomiting.  Genitourinary:  Negative for dysuria and frequency.  Musculoskeletal:  Positive for arthralgias and back pain. Negative for joint swelling and neck pain.  Skin:  Negative for rash.  Neurological: Negative.  Negative for tremors and numbness.  Hematological:  Negative for adenopathy. Does not bruise/bleed easily.  Psychiatric/Behavioral:  Negative for behavioral problems (Depression), sleep disturbance and suicidal ideas. The patient is not nervous/anxious.     Vital Signs: BP 138/70 Comment: 154/72  Pulse 67   Temp 97.7 F (36.5 C)   Resp 16   Ht 5\' 7"  (1.702 m)   Wt 298 lb (135.2 kg)   SpO2 98%   BMI 46.67 kg/m    Physical Exam Vitals and nursing note reviewed.  Constitutional:      General: She is not in acute distress.    Appearance: She is well-developed. She is obese. She is not diaphoretic.  HENT:     Head: Normocephalic and atraumatic.     Mouth/Throat:     Pharynx: No oropharyngeal exudate.  Eyes:     Pupils: Pupils are equal, round, and reactive to light.  Neck:     Thyroid: No thyromegaly.     Vascular: No JVD.     Trachea: No tracheal deviation.  Cardiovascular:     Rate and Rhythm:  Normal rate and regular rhythm.     Heart sounds: Normal heart sounds. No murmur heard.    No friction rub. No gallop.  Pulmonary:     Effort: Pulmonary effort is normal. No respiratory distress.     Breath sounds: No wheezing or rales.  Chest:     Chest wall: No tenderness.  Abdominal:     General: Bowel sounds are normal.     Palpations: Abdomen is soft.  Musculoskeletal:        General: Normal range of motion.     Cervical back: Normal range of motion and neck supple.  Lymphadenopathy:     Cervical: No cervical adenopathy.  Skin:    General: Skin is warm and dry.  Neurological:     Mental Status: She is alert and oriented to person, place, and time.     Cranial Nerves: No cranial nerve deficit.  Psychiatric:        Behavior: Behavior normal.        Thought Content: Thought content normal.        Judgment: Judgment normal.        Assessment/Plan: 1. Benign hypertension Stable, continue current medications  2. Chronic right-sided low back pain with right-sided sciatica May start on gabapentin up to TID. Advised to wait until lightheadedness and diabetic meds adjusted and to start with evening dosing as it may make her groggy. - gabapentin (NEURONTIN) 100 MG capsule; Take 1 capsule (100 mg total) by mouth 3 (three) times daily.  Dispense: 90 capsule; Refill: 3  3. Diabetes mellitus with peripheral vascular disease (HCC) followed by endo  4. Lymphedema of both lower extremities Followed by vascular  5. B12 deficiency - cyanocobalamin (VITAMIN B12) injection 1,000 mcg   General Counseling: Altamese verbalizes understanding of the findings of todays visit and agrees with plan of treatment. I have discussed any further diagnostic evaluation that may be needed or ordered today. We also reviewed her medications today. she has been encouraged to call the office with any questions or concerns that should arise related to todays visit.  No orders of the defined types were  placed in this encounter.   Meds ordered this encounter  Medications   cyanocobalamin (VITAMIN B12) injection 1,000 mcg   gabapentin (NEURONTIN) 100 MG capsule    Sig: Take 1 capsule (100 mg total) by mouth 3 (three) times daily.    Dispense:  90 capsule    Refill:  3    This patient was seen by Lynn Ito, PA-C in collaboration with Dr. Beverely Risen as a part of collaborative care agreement.   Total time spent:30 Minutes Time spent includes review of chart, medications, test results, and follow up plan with the patient.      Dr Lyndon Code Internal medicine

## 2021-10-04 ENCOUNTER — Ambulatory Visit: Payer: Medicare HMO

## 2021-10-04 DIAGNOSIS — M1711 Unilateral primary osteoarthritis, right knee: Secondary | ICD-10-CM | POA: Diagnosis not present

## 2021-10-05 ENCOUNTER — Ambulatory Visit: Payer: Medicare HMO | Admitting: Endocrinology

## 2021-10-05 ENCOUNTER — Telehealth: Payer: Self-pay | Admitting: Dietician

## 2021-10-05 ENCOUNTER — Encounter: Payer: Medicare HMO | Attending: Physician Assistant | Admitting: Dietician

## 2021-10-05 ENCOUNTER — Encounter: Payer: Self-pay | Admitting: Dietician

## 2021-10-05 ENCOUNTER — Encounter: Payer: Self-pay | Admitting: Endocrinology

## 2021-10-05 VITALS — BP 128/72 | HR 79 | Ht 67.0 in | Wt 301.4 lb

## 2021-10-05 DIAGNOSIS — E1165 Type 2 diabetes mellitus with hyperglycemia: Secondary | ICD-10-CM | POA: Diagnosis not present

## 2021-10-05 DIAGNOSIS — N183 Chronic kidney disease, stage 3 unspecified: Secondary | ICD-10-CM | POA: Insufficient documentation

## 2021-10-05 DIAGNOSIS — E1122 Type 2 diabetes mellitus with diabetic chronic kidney disease: Secondary | ICD-10-CM | POA: Insufficient documentation

## 2021-10-05 DIAGNOSIS — Z794 Long term (current) use of insulin: Secondary | ICD-10-CM

## 2021-10-05 LAB — POCT GLYCOSYLATED HEMOGLOBIN (HGB A1C): Hemoglobin A1C: 8.7 % — AB (ref 4.0–5.6)

## 2021-10-05 MED ORDER — OZEMPIC (0.25 OR 0.5 MG/DOSE) 2 MG/3ML ~~LOC~~ SOPN: 0.5000 mg | PEN_INJECTOR | SUBCUTANEOUS | 1 refills | Status: DC

## 2021-10-05 NOTE — Progress Notes (Signed)
Diabetes Self-Management Education  Visit Type: First/Initial  Appt. Start Time: 1145 Appt. End Time: 1330  10/05/2021  [name], identified by name and date of birth, is a 76 y.o. female with a diagnosis of Diabetes: Type 2.   ASSESSMENT Patient is here today alone. She needs knee replacement surgery. Blood glucose 66 in the office today.  Treated with 4 oz juice>BG67>4 oz juice>85.  History includes Type 2 Diabetes, vitamin  B-12 deficiency, fibromyalgia, GERD, HTN, HDL, neuropathy, CKD, OSA but cannot tolerate C-pap Medications include:  Farxiga, Ozempic (to restart), 40 units Novolin 70/30 with breakfast, lasix, vitamin D, Vitamin B-12 injection, GFR 43, 01/26/221 Labs noted to include:  A1C 8.7% 10/05/2021 increased from 8.2% 07/09/2021, vitamin D, 25 hydroxy 14.9 06/12/2021, Vitamin B-12 267 06/12/2021 CGM- FreeStyle LIbre 2 (with phone):  Average glucose over the past 2 weeks 167, Time in Range 65%, 30% above 180, no lows (but complains of low blood glucose a couple times recently)  Sleep:  Poor - only about 4 hours per night (fibromyalgia, knee pain, back pain)  Weight hx: 67" 301 lbs 10/05/2021  Estimated needs:  65-75 grams protein daily  Patient lives with her daughter and son-in-law.  They share shopping and cooking.  She lives in the basement apartment.  They bring her dinner.  They put in a sauna and a whirlpool but she does not use it partially due to needing someone to get her into it. She has a membership to a gym where there is a pool.   Has a chair peddler. She is retired.  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 She enjoys outdoors. Does not care for a lot of meat. They have done a lot of meal prepping in the past. Enjoys gambling with friends.  Uses this to reduce stress.  Does not drink alcohol.  Stress due to brother with severe alcohol.  Weight (!) 301 lb (136.5 kg). Body mass index is 47.14 kg/m.   Diabetes Self-Management Education - 10/05/21 1212        Visit Information   Visit Type First/Initial      Initial Visit   Diabetes Type Type 2    Date Diagnosed 2016    Are you currently following a meal plan? No    Are you taking your medications as prescribed? Yes      Health Coping   How would you rate your overall health? Fair      Psychosocial Assessment   Patient Belief/Attitude about Diabetes Motivated to manage diabetes    What is the hardest part about your diabetes right now, causing you the most concern, or is the most worrisome to you about your diabetes?   Being active;Making healty food and beverage choices    Self-care barriers None    Self-management support Doctor's office;Family    Other persons present Patient    Patient Concerns Nutrition/Meal planning;Glycemic Control;Healthy Lifestyle    Special Needs None    Preferred Learning Style No preference indicated    Learning Readiness Ready    How often do you need to have someone help you when you read instructions, pamphlets, or other written materials from your doctor or pharmacy? 1 - Never    What is the last grade level you completed in school? 12      Pre-Education Assessment   Patient understands the diabetes disease and treatment process. Needs Review    Patient understands incorporating nutritional management into lifestyle. Needs Review  Patient undertands incorporating physical activity into lifestyle. Needs Review    Patient understands using medications safely. Needs Review    Patient understands monitoring blood glucose, interpreting and using results Needs Review    Patient understands prevention, detection, and treatment of acute complications. Needs Review    Patient understands prevention, detection, and treatment of chronic complications. Needs Review    Patient understands how to develop strategies to address psychosocial issues. Needs Review    Patient understands how to develop strategies to promote health/change behavior. Needs Review       Complications   Last HgB A1C per patient/outside source 8.7 %   10/05/2021 increased from 8.2% 05/2021   How often do you check your blood sugar? > 4 times/day    Fasting Blood glucose range (mg/dL) >200;180-200;130-179    Postprandial Blood glucose range (mg/dL) 130-179;180-200;>200    Number of hypoglycemic episodes per month 1    Can you tell when your blood sugar is low? Yes    What do you do if your blood sugar is low? drinks juice    Number of hyperglycemic episodes ( >236m/dL): Daily    Can you tell when your blood sugar is high? Yes    What do you do if your blood sugar is high? nothing    Have you had a dilated eye exam in the past 12 months? Yes    Have you had a dental exam in the past 12 months? No    Are you checking your feet? Yes    How many days per week are you checking your feet? 7      Dietary Intake   Breakfast skips OR1/2 bagel, cheese, 2-3 strips bacon OR boiled eggs    Snack (morning) none    Lunch skips OR fish, salad OR zaxby's salad    Snack (afternoon) apple, trail mLand(1/2 side salad, 1 slice ham) OR Salmon cakes, green beans or other vegetables, quinoa occasional mac and cheese    Snack (evening) apple, trail mix    n    Beverage(s) sweet tea, regular pepsi, coffee with splenda and cream, juice, water      Activity / Exercise   Activity / Exercise Type ADL's      Patient Education   Previous Diabetes Education Yes (please comment)   4 years ago  through the family care home   Disease Pathophysiology Definition of diabetes, type 1 and 2, and the diagnosis of diabetes    Healthy Eating Food label reading, portion sizes and measuring food.;Plate Method;Meal options for control of blood glucose level and chronic complications.    Being Active Role of exercise on diabetes management, blood pressure control and cardiac health.;Helped patient identify appropriate exercises in relation to his/her diabetes, diabetes complications and other  health issue.    Medications Reviewed patients medication for diabetes, action, purpose, timing of dose and side effects.    Monitoring Taught/evaluated CGM (comment);Daily foot exams;Yearly dilated eye exam    Acute complications Taught prevention, symptoms, and  treatment of hypoglycemia - the 15 rule.;Discussed and identified patients' prevention, symptoms, and treatment of hyperglycemia.    Chronic complications Relationship between chronic complications and blood glucose control    Diabetes Stress and Support Identified and addressed patients feelings and concerns about diabetes;Worked with patient to identify barriers to care and solutions;Role of stress on diabetes      Individualized Goals (developed by patient)   Nutrition General guidelines for healthy choices  and portions discussed    Physical Activity Exercise 5-7 days per week;15 minutes per day    Medications take my medication as prescribed    Monitoring  Consistenly use CGM    Problem Solving Eating Pattern;Addressing barriers to behavior change    Reducing Risk examine blood glucose patterns;treat hypoglycemia with 15 grams of carbs if blood glucose less than 54m/dL;do foot checks daily      Post-Education Assessment   Patient understands the diabetes disease and treatment process. Demonstrates understanding / competency    Patient understands incorporating nutritional management into lifestyle. Comprehends key points    Patient undertands incorporating physical activity into lifestyle. Demonstrates understanding / competency    Patient understands using medications safely. Demonstrates understanding / competency    Patient understands monitoring blood glucose, interpreting and using results Demonstrates understanding / competency    Patient understands prevention, detection, and treatment of acute complications. Demonstrates understanding / competency    Patient understands prevention, detection, and treatment of chronic  complications. Demonstrates understanding / competency    Patient understands how to develop strategies to address psychosocial issues. Demonstrates understanding / competency    Patient understands how to develop strategies to promote health/change behavior. Comprehends key points      Outcomes   Expected Outcomes Demonstrated interest in learning but significant barriers to change    Future DMSE PRN    Program Status Completed             Individualized Plan for Diabetes Self-Management Training:   Learning Objective:  Patient will have a greater understanding of diabetes self-management. Patient education plan is to attend individual and/or group sessions per assessed needs and concerns.   Plan:   Patient Instructions  Increase your water intake.  This is much better for your kidney's and blood sugar.  Your water infusion bottle is great!  Juice, regular, soda, and sweet tea will all make your blood glucose hard to control.  Start walking with the rolling walker with the seat or armchair exercise or pool most days of the week.  Aim to have someone with you for safety.  Consider resuming meal prepping and eating more meals at home.  Aim for breakfast, lunch, and dinner daily.  Avoid skipping meals. Small portion of protein with each meal  (this can be beans, nuts)  Mindfulness:  Consistently scheduled meal - avoid skipping  Choices  Eat slowly  Away from distraction (sitting in kitchen or dining room)  Stop eating when satisfied  Before a snack ask, "Am I hungry or eating for another reason?"   "What can I do instead if I am not hungry?"  Try to find something every day that brings you joy!   Expected Outcomes:  Demonstrated interest in learning but significant barriers to change  Education material provided: ADA - How to Thrive: A Guide for Your Journey with Diabetes and Diabetes Resources, [facility] Lifestyle medicine packet.  If problems or questions, patient to  contact team via:  Phone  Future DSME appointment: PRN

## 2021-10-05 NOTE — Patient Instructions (Addendum)
Increase your water intake.  This is much better for your kidney's and blood sugar.  Your water infusion bottle is great!  Juice, regular, soda, and sweet tea will all make your blood glucose hard to control.  Start walking with the rolling walker with the seat or armchair exercise or pool most days of the week.  Aim to have someone with you for safety.  Consider resuming meal prepping and eating more meals at home.  Aim for breakfast, lunch, and dinner daily.  Avoid skipping meals. Small portion of protein with each meal  (this can be beans, nuts)  Mindfulness:  Consistently scheduled meal - avoid skipping  Choices  Eat slowly  Away from distraction (sitting in kitchen or dining room)  Stop eating when satisfied  Before a snack ask, "Am I hungry or eating for another reason?"   "What can I do instead if I am not hungry?"  Try to find something every day that brings you joy!

## 2021-10-05 NOTE — Progress Notes (Signed)
Patient ID: Bethany [name], female   DOB: 11-Oct-1945, 76 y.o.   MRN: [id number]            Reason for Appointment: Type II Diabetes follow-up   History of Present Illness   Diagnosis date: 1998  Previous history:  Non-insulin hypoglycemic drugs previously used:metformin years ago, Ozempic Insulin was started in 2009 Previously has used NPH and regular insulin separately, no other insulin recently  A1c range in the last few years is: 7.8-10.3  Recent history:     Non-insulin hypoglycemic drugs: None     Insulin regimen: Novolin 70/30, 40 units in a.m.          Side effects from medications: Possibly diarrhea from metformin  Current self management, blood sugar patterns and problems identified:  A1c is 8.7 and higher She was given a trial of Rybelsus instead of Ozempic but she could not afford this because of high co-pay She thinks her blood sugars were better with this She was also tried on Farxiga but this was costing her over $200 and she did not take it after the first prescription in July Although she thinks she takes her insulin consistently in the morning her blood sugars as seen below on the freestyle [employer] are highly inconsistent with no pattern Generally having postprandial hyperglycemia which is variable Some readings are higher because of consuming sweet tea such as yesterday at lunchtime Also her meal sizes are variable Waiting to see the dietitian today Overall hypoglycemia has been minimal  Exercise: Minimal  Diet management: Not following any meal plan     Hypoglycemia: None  Analysis of her freestyle libre sensor download as follows  Blood sugar data is available between 9/17 through 9/22 with incomplete data on 9/20 Blood sugars are highly inconsistent from day-to-day She had generally a very good even control on 9/19 with average glucose 152 and only mild hyperglycemia after lunch However glucose was persistently high on 9/17 averaging 340   Generally appears to have somewhat high readings inconsistently after meals at different times Overnight blood sugars are not consistent with higher readings early part of the night and variable readings the rest of the night including low normal reading earlier this morning  Premeal readings are generally higher at dinnertime but low normal on 9/20 Only this morning had mild transient hypoglycemia at 5 AM   CGM use % of time   2-week average/GV 207/40  Time in range       49%  % Time Above 180 23+27  % Time above 250   % Time Below 70 1     PRE-MEAL Fasting Lunch Dinner Bedtime Overall  Glucose range:       Averages: 128  220     POST-MEAL PC Breakfast PC Lunch PC Dinner  Glucose range:     Averages:  249 231   Previously:   CGM use % of time   2-week average/GV 167  Time in range 65       %  % Time Above 180 30  % Time above 250 5  % Time Below 70 0     PRE-MEAL Fasting Lunch Dinner Bedtime Overall  Glucose range:       Averages: 171  105  167   POST-MEAL PC Breakfast PC Lunch PC Dinner  Glucose range:     Averages: 208  211     Dietician visit: Most recent: 09/2021     Weight control:  Wt Readings from  Last 3 Encounters:  10/05/21 (!) 301 lb (136.5 kg)  10/05/21 (!) 301 lb 6.4 oz (136.7 kg)  09/21/21 298 lb (135.2 kg)            Diabetes labs:  Lab Results  Component Value Date   HGBA1C 8.7 (A) 10/05/2021   HGBA1C 8.2 (H) 07/09/2021   HGBA1C 7.8 (A) 04/03/2021   Lab Results  Component Value Date   MICROALBUR 9.7 (H) 07/09/2021   LDLCALC 65 07/09/2021   CREATININE 1.01 (H) 06/12/2021     Allergies as of 10/05/2021   No Known Allergies      Medication List        Accurate as of October 05, 2021  4:09 PM. If you have any questions, ask your nurse or doctor.          amLODipine 10 MG tablet Commonly known as: NORVASC TAKE 1 TABLET (10 MG TOTAL) BY MOUTH DAILY.   aspirin 81 MG tablet Take 81 mg by mouth daily. Reported on  03/29/2015   atorvastatin 20 MG tablet Commonly known as: LIPITOR TAKE 1 TABLET EVERY DAY   dapagliflozin propanediol 5 MG Tabs tablet Commonly known as: Farxiga Take 1 tablet (5 mg total) by mouth daily before breakfast.   Dexcom G6 Sensor Misc 1 Device by Does not apply route See admin instructions. Change every 10 days   diclofenac Sodium 1 % Gel Commonly known as: VOLTAREN Voltaren 1 % topical gel  APPLY 2 GRAMS TO THE AFFECTED AREA(S) BY TOPICAL ROUTE 4 TIMES PER DAY   docusate sodium 100 MG capsule Commonly known as: COLACE Take 100 mg by mouth daily as needed for mild constipation. Reported on 03/29/2015   Droplet Insulin Syringe 30G X 1/2" 1 ML Misc Generic drug: Insulin Syringe-Needle U-100 USE TO INJECT INSULIN EVERY DAY WITH BREAKFAST   DropSafe Alcohol Prep 70 % Pads USE TWICE DAILY   ergocalciferol 1.25 MG (50000 UT) capsule Commonly known as: Drisdol Take one cap q week   FISH OIL PO Take 2,400 mg by mouth daily.   furosemide 20 MG tablet Commonly known as: LASIX TAKE 1 TABLET EVERY DAY AS NEEDED   gabapentin 100 MG capsule Commonly known as: NEURONTIN Take 1 capsule (100 mg total) by mouth 3 (three) times daily.   losartan 50 MG tablet Commonly known as: COZAAR TAKE 1 TABLET EVERY DAY   metoprolol succinate 50 MG 24 hr tablet Commonly known as: TOPROL-XL TAKE 1 TABLET EVERY DAY TAKE WITH OR IMMEDIATELY FOLLOWING A MEAL   NovoLIN 70/30 (70-30) 100 UNIT/ML injection Generic drug: insulin NPH-regular Human Inject 40 Units into the skin daily with breakfast.   omeprazole 20 MG capsule Commonly known as: PRILOSEC TAKE 1 CAPSULE EVERY DAY   Ozempic (0.25 or 0.5 MG/DOSE) 2 MG/3ML Sopn Generic drug: Semaglutide(0.25 or 0.5MG /DOS) Inject 0.5 mg into the skin once a week. Started by: Reather Littler, MD   triamcinolone cream 0.1 % Commonly known as: KENALOG APPLY TOPICALLY 4 (FOUR) TIMES DAILY AS NEEDED FOR ITCHING   True Metrix Blood Glucose Test  test strip Generic drug: glucose blood 1 each by Other route 4 (four) times daily.   True Metrix Meter Devi by Does not apply route.   vitamin B-12 100 MCG tablet Commonly known as: CYANOCOBALAMIN Take 100 mcg by mouth daily.        Allergies: No Known Allergies  Past Medical History:  Diagnosis Date   Arthritis    Chronic kidney disease    Diabetes  mellitus without complication (HCC)    Type II   Fibromyalgia    GERD (gastroesophageal reflux disease)    better after gall bladder surgery-    Hyperlipidemia    Hypertension    Shortness of breath dyspnea    with exertion    Past Surgical History:  Procedure Laterality Date   ABDOMINAL HYSTERECTOMY  01/15/1995   CATARACT EXTRACTION Bilateral 01/15/2012   CHOLECYSTECTOMY  01/15/2008   COLONOSCOPY     COLONOSCOPY WITH PROPOFOL N/A 04/21/2015   Procedure: COLONOSCOPY WITH PROPOFOL;  Surgeon: Hulen Luster, MD;  [facility];  Service: Gastroenterology;  Laterality: N/A;   EYE SURGERY Bilateral    Cataract   LUMBAR WOUND DEBRIDEMENT N/A 10/20/2014   Procedure: LUMBAR WOUND DEBRIDEMENT;  Surgeon: Eustace Moore, MD;  Location: [place] [facility];  Service: Neurosurgery;  Laterality: N/A;   MAXIMUM ACCESS (MAS)POSTERIOR LUMBAR INTERBODY FUSION (PLIF) 1 LEVEL N/A 09/16/2014   Procedure: L/4-5 FOR MAXIMUM ACCESS (MAS) POSTERIOR LUMBAR INTERBODY FUSION (PLIF) 1 LEVEL;  Surgeon: Eustace Moore, MD;  Location: [place] [facility];  Service: Neurosurgery;  Laterality: N/A;  L/4-5 FOR MAXIMUM ACCESS (MAS) POSTERIOR LUMBAR INTERBODY FUSION (PLIF) 1 LEVEL   REFRACTIVE SURGERY Left     Family History  Problem Relation Age of Onset   Arthritis Mother    Cancer Mother        Ovarian   Diabetes Mother    Heart attack Father    Heart disease Father    Hypertension Father    Cancer Brother    Diabetes Brother     Social History:  reports that she has never smoked. She has never used smokeless tobacco. She reports that she does not  drink alcohol and does not use drugs.  Review of Systems:  Last diabetic eye exam date 2/22  Last foot exam date: 10/2020  Symptoms of neuropathy: Some numbness present in her feet  Hypertension:   Treatment includes Losartan 50 mg daily   BP Readings from Last 3 Encounters:  10/05/21 128/72  09/21/21 138/70  07/24/21 138/75   Takes Lasix only occasionally as she does not have significant edema No recent change in renal function  Lab Results  Component Value Date   CREATININE 1.01 (H) 06/12/2021   CREATININE 1.21 (H) 01/27/2020   CREATININE 1.11 08/26/2019    Lipid management: Taking 20 mg Lipitor    Lab Results  Component Value Date   CHOL 139 07/09/2021   CHOL 139 06/12/2021   CHOL 126 01/27/2020   Lab Results  Component Value Date   HDL 41.70 07/09/2021   HDL 41 06/12/2021   HDL 49.90 01/27/2020   Lab Results  Component Value Date   LDLCALC 65 07/09/2021   LDLCALC 72 06/12/2021   LDLCALC 52 01/27/2020   Lab Results  Component Value Date   TRIG 164.0 (H) 07/09/2021   TRIG 151 (H) 06/12/2021   TRIG 118.0 01/27/2020   Lab Results  Component Value Date   CHOLHDL 3 07/09/2021   CHOLHDL 3 01/27/2020   CHOLHDL 3 06/29/2018   No results found for: "LDLDIRECT"   Examination:   BP 128/72 (BP Location: Left Arm, Patient Position: Sitting, Cuff Size: Normal)   Pulse 79   Ht 5\' 7"  (1.702 m)   Wt (!) 301 lb 6.4 oz (136.7 kg)   SpO2 98%   BMI 47.21 kg/m   Body mass index is 47.21 kg/m.    ASSESSMENT/ PLAN:    Diabetes type 2  with severe obesity:   Current regimen: Novolin 70/30 40 units in a.m. only  See history of present illness for detailed discussion of current diabetes management, blood sugar patterns and problems identified  A1c is consistently high and now 8.7, relatively higher  Blood glucose control is still poor and inconsistent As discussed above her blood sugars were evaluated from freestyle libre sensor but she has difficulty with  controlling readings after meals based on her diet and conjunction of sweetened drinks Also unable to lose weight Has significant difficulty affording medications that are brand-name because of likely being in the donut hole She is also getting the freestyle libre sensor from the local pharmacy instead of DME supplier  Currently cannot afford SGLT2 with also  Recommendations:  She does need to stop drinking sweet tea and other regular soft drinks Discussed the importance of weight loss DIABETES education: She will be seeing nutritionist today She will try Ozempic again at 0.25 mg dose at least for some time and if she is tolerating it well may consider increasing it further on the next visit If she starts getting low blood sugars will need to reduce her insulin to 35 Does need to get her freestyle libre sensor from a DME supplier instead of the pharmacy and discussed that this should be covered better  There are no Patient Instructions on file for this visit.  Total visit time including counseling = 30 minutes  Reather LittlerAjay Elvie Maines 10/05/2021, 4:09 PM   Addendum: Her co-pay is high on Ozempic and she will apply for patient assistance

## 2021-10-05 NOTE — Telephone Encounter (Signed)
Called and unformed patient that she would need to apply for Patient Assistance through [employer] for [employer].  Told her how to find this application.  She stated that a pharmacist also faxed paperwork for this application to the [employer].  Patient to call for further questions.  Antonieta Iba, RD, LDN, CDCES

## 2021-10-08 NOTE — Telephone Encounter (Signed)
Called patient and informed her that [facility] did not receive the paperwork that the pharmacist had faxed last week for the Ozempic patient assistance.  Instructed patient to have the pharmacist refax this to our office [phone] and I will forward this to the appropriate people.  Patient to call for further questions.  Antonieta Iba, RD, LDN, CDCES

## 2021-10-09 ENCOUNTER — Telehealth: Payer: Self-pay | Admitting: Pharmacy Technician

## 2021-10-09 ENCOUNTER — Other Ambulatory Visit (HOSPITAL_COMMUNITY): Payer: Self-pay

## 2021-10-09 NOTE — Telephone Encounter (Addendum)
Received a fax form from [place]. Tier exception form. I'm guessing the pt called. I am unable to find out the pt's copay, due to it already being filled on 9/22From what I can find, Ozempic is a Tier 3 preferred brand medication on the pt's plan. Saving pt's formulary in [place]. (Just the blood glucose regulators, bc the whole thing is over 100 pages) This is what it say about a tier exception.  This is from the form:   Typically when I see this, with the med being preferred already, since it's a Brand they usually will not drop it to a generic copay tier.  Please answer these questions and I'll fill out the form to see if we can get it lowered.

## 2021-10-10 DIAGNOSIS — E119 Type 2 diabetes mellitus without complications: Secondary | ICD-10-CM | POA: Diagnosis not present

## 2021-10-10 DIAGNOSIS — M1711 Unilateral primary osteoarthritis, right knee: Secondary | ICD-10-CM | POA: Diagnosis not present

## 2021-10-12 ENCOUNTER — Encounter: Payer: Self-pay | Admitting: Endocrinology

## 2021-10-15 DIAGNOSIS — M170 Bilateral primary osteoarthritis of knee: Secondary | ICD-10-CM | POA: Diagnosis not present

## 2021-10-15 DIAGNOSIS — I890 Lymphedema, not elsewhere classified: Secondary | ICD-10-CM | POA: Diagnosis not present

## 2021-10-22 ENCOUNTER — Other Ambulatory Visit (HOSPITAL_COMMUNITY): Payer: Self-pay

## 2021-10-23 NOTE — Telephone Encounter (Signed)
What dose for the ozempic for patient assistance.

## 2021-10-23 NOTE — Telephone Encounter (Signed)
Denial of Ozempic for tiering exception.   Scanned document to chart.

## 2021-10-25 ENCOUNTER — Ambulatory Visit (INDEPENDENT_AMBULATORY_CARE_PROVIDER_SITE_OTHER): Payer: Medicare HMO | Admitting: Nurse Practitioner

## 2021-10-25 ENCOUNTER — Encounter (INDEPENDENT_AMBULATORY_CARE_PROVIDER_SITE_OTHER): Payer: Self-pay | Admitting: Nurse Practitioner

## 2021-10-25 VITALS — BP 151/76 | HR 79 | Resp 18 | Ht 67.0 in | Wt 296.4 lb

## 2021-10-25 DIAGNOSIS — Z794 Long term (current) use of insulin: Secondary | ICD-10-CM | POA: Diagnosis not present

## 2021-10-25 DIAGNOSIS — N183 Chronic kidney disease, stage 3 unspecified: Secondary | ICD-10-CM

## 2021-10-25 DIAGNOSIS — E1122 Type 2 diabetes mellitus with diabetic chronic kidney disease: Secondary | ICD-10-CM

## 2021-10-25 DIAGNOSIS — I890 Lymphedema, not elsewhere classified: Secondary | ICD-10-CM

## 2021-10-25 MED ORDER — DOXYCYCLINE HYCLATE 100 MG PO CAPS: 100.0000 mg | ORAL_CAPSULE | Freq: Two times a day (BID) | ORAL | 0 refills | Status: DC

## 2021-10-28 ENCOUNTER — Encounter (INDEPENDENT_AMBULATORY_CARE_PROVIDER_SITE_OTHER): Payer: Self-pay | Admitting: Nurse Practitioner

## 2021-10-28 NOTE — Progress Notes (Signed)
Subjective:    Patient ID: Bethany [name], female    DOB: 1945/03/24, 76 y.o.   MRN: [id number] No chief complaint on file.   Bethany [name] is a 76 year old female who returns today for follow-up of her lymphedema.  She notes that her swelling is worsening she has began to have small blisters and her skin is very tender.  The patient continues to struggle with medical adherence for lymphedema.  She does elevate her lower extremities but she does not wear compression consistently.  She also does not utilize her lymphedema pump.  Currently there is no weeping of her lower extremities.    Review of Systems  Cardiovascular:  Positive for leg swelling.  All other systems reviewed and are negative.      Objective:   Physical Exam Vitals reviewed.  HENT:     Head: Normocephalic.  Cardiovascular:     Rate and Rhythm: Normal rate.  Pulmonary:     Effort: Pulmonary effort is normal.  Musculoskeletal:     Right lower leg: Edema present.     Left lower leg: Edema present.  Skin:    General: Skin is warm and dry.  Neurological:     Mental Status: She is alert and oriented to person, place, and time.  Psychiatric:        Mood and Affect: Mood normal.        Behavior: Behavior normal.        Thought Content: Thought content normal.     BP (!) 151/76 (BP Location: Right Arm)   Pulse 79   Resp 18   Ht 5\' 7"  (1.702 m)   Wt 296 lb 6.4 oz (134.4 kg)   BMI 46.42 kg/m   Past Medical History:  Diagnosis Date   Arthritis    Chronic kidney disease    Diabetes mellitus without complication (HCC)    Type II   Fibromyalgia    GERD (gastroesophageal reflux disease)    better after gall bladder surgery-    Hyperlipidemia    Hypertension    Shortness of breath dyspnea    with exertion    Social History   Socioeconomic History   Marital status: Widowed    Spouse name: Not on file   Number of children: Not on file   Years of education: Not on file   Highest education level: Not  on file  Occupational History   Not on file  Tobacco Use   Smoking status: Never   Smokeless tobacco: Never  Vaping Use   Vaping Use: Never used  Substance and Sexual Activity   Alcohol use: No   Drug use: No   Sexual activity: Not Currently  Other Topics Concern   Not on file  Social History Narrative   Not on file   Social Determinants of Health   Financial Resource Strain: Not on file  Food Insecurity: Not on file  Transportation Needs: Not on file  Physical Activity: Not on file  Stress: Not on file  Social Connections: Not on file  Intimate Partner Violence: Not on file    Past Surgical History:  Procedure Laterality Date   ABDOMINAL HYSTERECTOMY  01/15/1995   CATARACT EXTRACTION Bilateral 01/15/2012   CHOLECYSTECTOMY  01/15/2008   COLONOSCOPY     COLONOSCOPY WITH PROPOFOL N/A 04/21/2015   Procedure: COLONOSCOPY WITH PROPOFOL;  Surgeon: Hulen Luster, MD;  Location[facility];  Service: Gastroenterology;  Laterality: N/A;   EYE SURGERY Bilateral    Cataract  LUMBAR WOUND DEBRIDEMENT N/A 10/20/2014   Procedure: LUMBAR WOUND DEBRIDEMENT;  Surgeon: Eustace Moore, MD;  Location: [place] NEUR[facility] ORS;  Service: Neurosurgery;  Laterality: N/A;   MAXIMUM ACCESS (MAS)POSTERIOR LUMBAR INTERBODY FUSION (PLIF) 1 LEVEL N/A 09/16/2014   Procedure: L/4-5 FOR MAXIMUM ACCESS (MAS) POSTERIOR LUMBAR INTERBODY FUSION (PLIF) 1 LEVEL;  Surgeon: Eustace Moore, MD;  Location: [place] NEUR[facility] ORS;  Service: Neurosurgery;  Laterality: N/A;  L/4-5 FOR MAXIMUM ACCESS (MAS) POSTERIOR LUMBAR INTERBODY FUSION (PLIF) 1 LEVEL   REFRACTIVE SURGERY Left     Family History  Problem Relation Age of Onset   Arthritis Mother    Cancer Mother        Ovarian   Diabetes Mother    Heart attack Father    Heart disease Father    Hypertension Father    Cancer Brother    Diabetes Brother     No Known Allergies     Latest Ref Rng & Units 06/12/2021   10:36 AM 01/27/2020    4:18 PM 06/29/2018   12:31 PM  CBC   WBC 3.4 - 10.8 x10E3/uL 8.0  8.6  7.3   Hemoglobin 11.1 - 15.9 g/dL 12.3  12.2  12.2   Hematocrit 34.0 - 46.6 % 39.1  37.7  38.0   Platelets 150 - 450 x10E3/uL 315  270.0  279.0       CMP     Component Value Date/Time   NA 144 06/12/2021 1036   K 4.8 06/12/2021 1036   CL 103 06/12/2021 1036   CO2 23 06/12/2021 1036   GLUCOSE 198 (H) 07/09/2021 1119   BUN 18 06/12/2021 1036   CREATININE 1.01 (H) 06/12/2021 1036   CALCIUM 9.8 06/12/2021 1036   PROT 7.4 06/12/2021 1036   ALBUMIN 4.2 06/12/2021 1036   AST 12 06/12/2021 1036   ALT 12 06/12/2021 1036   ALKPHOS 164 (H) 06/12/2021 1036   BILITOT 0.3 06/12/2021 1036   GFRNONAA 55 (L) 10/20/2014 1039   GFRAA >60 10/20/2014 1039     No results found.     Assessment & Plan:   1. Lymphedema Today the patient's lymphedema has worsened to where she now has the beginning of blisters which are typically preulcerative.  Because of this we will have the patient placed in Unna boots.  We will have this changed on a weekly basis.  She will follow-up in 4 weeks for reevaluation.  The patient also has some redness and tenderness which is concerning for cellulitis.  We will also send the patient on doxycycline.  2. Type 2 diabetes mellitus with stage 3 chronic kidney disease, with long-term current use of insulin, unspecified whether stage 3a or 3b CKD ([place]) Continue hypoglycemic medications as already ordered, these medications have been reviewed and there are no changes at this time.  Hgb A1C to be monitored as already arranged by primary service    Current Outpatient Medications on File Prior to Visit  Medication Sig Dispense Refill   Alcohol Swabs (DROPSAFE ALCOHOL PREP) 70 % PADS USE TWICE DAILY 200 each 2   amLODipine (NORVASC) 10 MG tablet TAKE 1 TABLET (10 MG TOTAL) BY MOUTH DAILY. 90 tablet 1   atorvastatin (LIPITOR) 20 MG tablet TAKE 1 TABLET EVERY DAY 90 tablet 1   Blood Glucose Monitoring Suppl (TRUE METRIX METER) DEVI by Does  not apply route.     Continuous Blood Gluc Sensor (DEXCOM G6 SENSOR) MISC 1 Device by Does not apply route See admin instructions.  Change every 10 days 9 each 3   Cyanocobalamin (B-12 COMPLIANCE INJECTION IJ) Inject as directed.     diclofenac Sodium (VOLTAREN) 1 % GEL Voltaren 1 % topical gel  APPLY 2 GRAMS TO THE AFFECTED AREA(S) BY TOPICAL ROUTE 4 TIMES PER DAY     docusate sodium (COLACE) 100 MG capsule Take 100 mg by mouth daily as needed for mild constipation. Reported on 03/29/2015     DROPLET INSULIN SYRINGE 30G X 1/2" 1 ML MISC USE TO INJECT INSULIN EVERY DAY WITH BREAKFAST 100 each 2   furosemide (LASIX) 20 MG tablet TAKE 1 TABLET EVERY DAY AS NEEDED 90 tablet 0   glucose blood (TRUE METRIX BLOOD GLUCOSE TEST) test strip 1 each by Other route 4 (four) times daily. 400 each 1   insulin NPH-regular Human (NOVOLIN 70/30) (70-30) 100 UNIT/ML injection Inject 40 Units into the skin daily with breakfast. 50 mL 2   losartan (COZAAR) 50 MG tablet TAKE 1 TABLET EVERY DAY 90 tablet 1   metoprolol succinate (TOPROL-XL) 50 MG 24 hr tablet TAKE 1 TABLET EVERY DAY TAKE WITH OR IMMEDIATELY FOLLOWING A MEAL 90 tablet 3   triamcinolone cream (KENALOG) 0.1 % APPLY TOPICALLY 4 (FOUR) TIMES DAILY AS NEEDED FOR ITCHING 80 g 2   gabapentin (NEURONTIN) 100 MG capsule Take 1 capsule (100 mg total) by mouth 3 (three) times daily. (Patient not taking: Reported on 10/25/2021) 90 capsule 3   No current facility-administered medications on file prior to visit.    There are no Patient Instructions on file for this visit. No follow-ups on file.   Kris Hartmann, NP

## 2021-10-29 NOTE — Telephone Encounter (Signed)
noted 

## 2021-10-31 ENCOUNTER — Other Ambulatory Visit: Payer: Self-pay

## 2021-10-31 DIAGNOSIS — E1165 Type 2 diabetes mellitus with hyperglycemia: Secondary | ICD-10-CM

## 2021-10-31 MED ORDER — TRUEPLUS LANCETS 33G MISC: 2 refills | Status: DC

## 2021-10-31 MED ORDER — DROPSAFE ALCOHOL PREP 70 % PADS: MEDICATED_PAD | 2 refills | Status: DC

## 2021-11-01 ENCOUNTER — Ambulatory Visit (INDEPENDENT_AMBULATORY_CARE_PROVIDER_SITE_OTHER): Payer: Medicare HMO | Admitting: Nurse Practitioner

## 2021-11-01 ENCOUNTER — Encounter (INDEPENDENT_AMBULATORY_CARE_PROVIDER_SITE_OTHER): Payer: Self-pay

## 2021-11-01 ENCOUNTER — Ambulatory Visit (INDEPENDENT_AMBULATORY_CARE_PROVIDER_SITE_OTHER): Payer: Medicare HMO

## 2021-11-01 VITALS — BP 149/73 | HR 87 | Resp 16

## 2021-11-01 DIAGNOSIS — E538 Deficiency of other specified B group vitamins: Secondary | ICD-10-CM | POA: Diagnosis not present

## 2021-11-01 DIAGNOSIS — I890 Lymphedema, not elsewhere classified: Secondary | ICD-10-CM

## 2021-11-01 DIAGNOSIS — E1165 Type 2 diabetes mellitus with hyperglycemia: Secondary | ICD-10-CM | POA: Diagnosis not present

## 2021-11-01 MED ORDER — CYANOCOBALAMIN 1000 MCG/ML IJ SOLN
1000.0000 ug | Freq: Once | INTRAMUSCULAR | Status: AC
  Administered 2021-11-01: 1000 ug via INTRAMUSCULAR

## 2021-11-01 NOTE — Progress Notes (Signed)
History of Present Illness  There is no documented history at this time  Assessments & Plan   There are no diagnoses linked to this encounter.    Additional instructions  Subjective:  Patient presents with venous ulcer of the Bilateral lower extremity.    Procedure:  3 layer unna wrap was placed Bilateral lower extremity.   Plan:   Follow up in one week.  

## 2021-11-08 ENCOUNTER — Encounter (INDEPENDENT_AMBULATORY_CARE_PROVIDER_SITE_OTHER): Payer: Self-pay | Admitting: Nurse Practitioner

## 2021-11-08 ENCOUNTER — Ambulatory Visit (INDEPENDENT_AMBULATORY_CARE_PROVIDER_SITE_OTHER): Payer: Medicare HMO | Admitting: Nurse Practitioner

## 2021-11-08 VITALS — BP 164/70 | HR 75 | Resp 16

## 2021-11-08 DIAGNOSIS — I890 Lymphedema, not elsewhere classified: Secondary | ICD-10-CM | POA: Diagnosis not present

## 2021-11-08 NOTE — Progress Notes (Signed)
History of Present Illness  There is no documented history at this time  Assessments & Plan   There are no diagnoses linked to this encounter.    Additional instructions  Subjective:  Patient presents with venous ulcer of the Bilateral lower extremity.    Procedure:  3 layer unna wrap was placed Bilateral lower extremity.   Plan:   Follow up in one week.  

## 2021-11-10 ENCOUNTER — Encounter (INDEPENDENT_AMBULATORY_CARE_PROVIDER_SITE_OTHER): Payer: Self-pay | Admitting: Nurse Practitioner

## 2021-11-11 ENCOUNTER — Encounter (INDEPENDENT_AMBULATORY_CARE_PROVIDER_SITE_OTHER): Payer: Self-pay | Admitting: Nurse Practitioner

## 2021-11-12 ENCOUNTER — Encounter (INDEPENDENT_AMBULATORY_CARE_PROVIDER_SITE_OTHER): Payer: Self-pay

## 2021-11-14 DIAGNOSIS — N1831 Chronic kidney disease, stage 3a: Secondary | ICD-10-CM | POA: Diagnosis not present

## 2021-11-14 DIAGNOSIS — I10 Essential (primary) hypertension: Secondary | ICD-10-CM | POA: Diagnosis not present

## 2021-11-14 DIAGNOSIS — E663 Overweight: Secondary | ICD-10-CM | POA: Diagnosis not present

## 2021-11-14 DIAGNOSIS — E785 Hyperlipidemia, unspecified: Secondary | ICD-10-CM | POA: Diagnosis not present

## 2021-11-14 DIAGNOSIS — R609 Edema, unspecified: Secondary | ICD-10-CM | POA: Diagnosis not present

## 2021-11-14 DIAGNOSIS — E1122 Type 2 diabetes mellitus with diabetic chronic kidney disease: Secondary | ICD-10-CM | POA: Diagnosis not present

## 2021-11-14 DIAGNOSIS — I509 Heart failure, unspecified: Secondary | ICD-10-CM | POA: Diagnosis not present

## 2021-11-15 ENCOUNTER — Encounter (INDEPENDENT_AMBULATORY_CARE_PROVIDER_SITE_OTHER): Payer: Self-pay

## 2021-11-15 ENCOUNTER — Ambulatory Visit (INDEPENDENT_AMBULATORY_CARE_PROVIDER_SITE_OTHER): Payer: Medicare HMO | Admitting: Nurse Practitioner

## 2021-11-15 VITALS — BP 177/70 | HR 87 | Resp 19 | Ht 67.0 in | Wt 305.4 lb

## 2021-11-15 DIAGNOSIS — I890 Lymphedema, not elsewhere classified: Secondary | ICD-10-CM

## 2021-11-15 NOTE — Progress Notes (Signed)
History of Present Illness  There is no documented history at this time  Assessments & Plan   There are no diagnoses linked to this encounter.    Additional instructions  Subjective:  Patient presents with venous ulcer of the Bilateral lower extremity.    Procedure:  3 layer unna wrap was placed Bilateral lower extremity.   Plan:   Follow up in one week.  

## 2021-11-22 ENCOUNTER — Encounter (INDEPENDENT_AMBULATORY_CARE_PROVIDER_SITE_OTHER): Payer: Medicare HMO

## 2021-11-25 ENCOUNTER — Encounter (INDEPENDENT_AMBULATORY_CARE_PROVIDER_SITE_OTHER): Payer: Self-pay | Admitting: Nurse Practitioner

## 2021-11-26 ENCOUNTER — Ambulatory Visit (INDEPENDENT_AMBULATORY_CARE_PROVIDER_SITE_OTHER): Payer: Medicare HMO | Admitting: Nurse Practitioner

## 2021-11-26 ENCOUNTER — Encounter (INDEPENDENT_AMBULATORY_CARE_PROVIDER_SITE_OTHER): Payer: Self-pay

## 2021-11-26 VITALS — BP 131/75 | HR 75 | Resp 16 | Wt 302.0 lb

## 2021-11-26 DIAGNOSIS — I890 Lymphedema, not elsewhere classified: Secondary | ICD-10-CM

## 2021-11-26 NOTE — Progress Notes (Signed)
History of Present Illness  There is no documented history at this time  Assessments & Plan   There are no diagnoses linked to this encounter.    Additional instructions  Subjective:  Patient presents with venous ulcer of the Bilateral lower extremity.    Procedure:  3 layer unna wrap was placed Bilateral lower extremity.   Plan:   Follow up in one week.  

## 2021-11-29 ENCOUNTER — Ambulatory Visit (INDEPENDENT_AMBULATORY_CARE_PROVIDER_SITE_OTHER): Payer: Medicare HMO

## 2021-11-29 ENCOUNTER — Ambulatory Visit (INDEPENDENT_AMBULATORY_CARE_PROVIDER_SITE_OTHER): Payer: Medicare HMO | Admitting: Nurse Practitioner

## 2021-11-29 DIAGNOSIS — E538 Deficiency of other specified B group vitamins: Secondary | ICD-10-CM | POA: Diagnosis not present

## 2021-11-29 MED ORDER — CYANOCOBALAMIN 1000 MCG/ML IJ SOLN
1000.0000 ug | Freq: Once | INTRAMUSCULAR | Status: AC
  Administered 2021-11-29: 1000 ug via INTRAMUSCULAR

## 2021-12-03 ENCOUNTER — Ambulatory Visit (INDEPENDENT_AMBULATORY_CARE_PROVIDER_SITE_OTHER): Payer: Medicare HMO | Admitting: Nurse Practitioner

## 2021-12-03 ENCOUNTER — Encounter (INDEPENDENT_AMBULATORY_CARE_PROVIDER_SITE_OTHER): Payer: Self-pay | Admitting: Nurse Practitioner

## 2021-12-03 VITALS — BP 154/80 | HR 74 | Resp 16 | Wt 300.0 lb

## 2021-12-03 DIAGNOSIS — Z794 Long term (current) use of insulin: Secondary | ICD-10-CM

## 2021-12-03 DIAGNOSIS — N1831 Chronic kidney disease, stage 3a: Secondary | ICD-10-CM

## 2021-12-03 DIAGNOSIS — I890 Lymphedema, not elsewhere classified: Secondary | ICD-10-CM | POA: Diagnosis not present

## 2021-12-03 DIAGNOSIS — I5032 Chronic diastolic (congestive) heart failure: Secondary | ICD-10-CM

## 2021-12-03 DIAGNOSIS — E1122 Type 2 diabetes mellitus with diabetic chronic kidney disease: Secondary | ICD-10-CM

## 2021-12-03 DIAGNOSIS — N183 Chronic kidney disease, stage 3 unspecified: Secondary | ICD-10-CM

## 2021-12-03 MED ORDER — DOXYCYCLINE HYCLATE 100 MG PO CAPS: 100.0000 mg | ORAL_CAPSULE | Freq: Two times a day (BID) | ORAL | 0 refills | Status: DC

## 2021-12-09 ENCOUNTER — Encounter (INDEPENDENT_AMBULATORY_CARE_PROVIDER_SITE_OTHER): Payer: Self-pay | Admitting: Nurse Practitioner

## 2021-12-09 NOTE — Progress Notes (Signed)
Subjective:    Patient ID: Bethany [name], female    DOB: 1945-04-14, 76 y.o.   MRN: [id number] Chief Complaint  Patient presents with   Follow-up    Unna boot follow up    Bethany [name] is a 76 year old female who returns today for evaluation of her lower extremity edema.  The patient has been in bilateral Unna boots for several weeks.  The right lower extremity has improved but the left lower extremity she has noted blisters and wounds that continue to be slow to heal.  She also notes that she has been elevating her legs as well as wearing her Unna boots as prescribed.  The patient does note that recently her thighs have been feeling tight and swollen.  She also notes that she has been having increased shortness of breath with activity.    Review of Systems  Cardiovascular:  Positive for leg swelling.  Skin:  Positive for wound.  All other systems reviewed and are negative.      Objective:   Physical Exam Vitals reviewed.  HENT:     Head: Normocephalic.  Cardiovascular:     Rate and Rhythm: Normal rate.  Pulmonary:     Effort: Pulmonary effort is normal.  Musculoskeletal:     Right lower leg: Edema present.     Left lower leg: Edema present.  Skin:    General: Skin is warm and dry.  Neurological:     Mental Status: She is alert and oriented to person, place, and time.  Psychiatric:        Mood and Affect: Mood normal.        Behavior: Behavior normal.        Thought Content: Thought content normal.        Judgment: Judgment normal.     BP (!) 154/80 (BP Location: Left Arm)   Pulse 74   Resp 16   Wt 300 lb (136.1 kg)   BMI 46.99 kg/m   Past Medical History:  Diagnosis Date   Arthritis    Chronic kidney disease    Diabetes mellitus without complication (HCC)    Type II   Fibromyalgia    GERD (gastroesophageal reflux disease)    better after gall bladder surgery-    Hyperlipidemia    Hypertension    Shortness of breath dyspnea    with exertion     Social History   Socioeconomic History   Marital status: Widowed    Spouse name: Not on file   Number of children: Not on file   Years of education: Not on file   Highest education level: Not on file  Occupational History   Not on file  Tobacco Use   Smoking status: Never   Smokeless tobacco: Never  Vaping Use   Vaping Use: Never used  Substance and Sexual Activity   Alcohol use: No   Drug use: No   Sexual activity: Not Currently  Other Topics Concern   Not on file  Social History Narrative   Not on file   Social Determinants of Health   Financial Resource Strain: Not on file  Food Insecurity: Not on file  Transportation Needs: Not on file  Physical Activity: Not on file  Stress: Not on file  Social Connections: Not on file  Intimate Partner Violence: Not on file    Past Surgical History:  Procedure Laterality Date   ABDOMINAL HYSTERECTOMY  01/15/1995   CATARACT EXTRACTION Bilateral 01/15/2012   CHOLECYSTECTOMY  01/15/2008  COLONOSCOPY     COLONOSCOPY WITH PROPOFOL N/A 04/21/2015   Procedure: COLONOSCOPY WITH PROPOFOL;  Surgeon: Wallace Cullens, MD;  Location: [facility] ENDOSCOPY;  Service: Gastroenterology;  Laterality: N/A;   EYE SURGERY Bilateral    Cataract   LUMBAR WOUND DEBRIDEMENT N/A 10/20/2014   Procedure: LUMBAR WOUND DEBRIDEMENT;  Surgeon: Tia Alert, MD;  Location: [facility];  Service: Neurosurgery;  Laterality: N/A;   MAXIMUM ACCESS (MAS)POSTERIOR LUMBAR INTERBODY FUSION (PLIF) 1 LEVEL N/A 09/16/2014   Procedure: L/4-5 FOR MAXIMUM ACCESS (MAS) POSTERIOR LUMBAR INTERBODY FUSION (PLIF) 1 LEVEL;  Surgeon: Tia Alert, MD;  Location: [facility];  Service: Neurosurgery;  Laterality: N/A;  L/4-5 FOR MAXIMUM ACCESS (MAS) POSTERIOR LUMBAR INTERBODY FUSION (PLIF) 1 LEVEL   REFRACTIVE SURGERY Left     Family History  Problem Relation Age of Onset   Arthritis Mother    Cancer Mother        Ovarian   Diabetes Mother    Heart attack Father    Heart  disease Father    Hypertension Father    Cancer Brother    Diabetes Brother     No Known Allergies     Latest Ref Rng & Units 06/12/2021   10:36 AM 01/27/2020    4:18 PM 06/29/2018   12:31 PM  CBC  WBC 3.4 - 10.8 x10E3/uL 8.0  8.6  7.3   Hemoglobin 11.1 - 15.9 g/dL 51.0  25.8  52.7   Hematocrit 34.0 - 46.6 % 39.1  37.7  38.0   Platelets 150 - 450 x10E3/uL 315  270.0  279.0       CMP     Component Value Date/Time   NA 144 06/12/2021 1036   K 4.8 06/12/2021 1036   CL 103 06/12/2021 1036   CO2 23 06/12/2021 1036   GLUCOSE 198 (H) 07/09/2021 1119   BUN 18 06/12/2021 1036   CREATININE 1.01 (H) 06/12/2021 1036   CALCIUM 9.8 06/12/2021 1036   PROT 7.4 06/12/2021 1036   ALBUMIN 4.2 06/12/2021 1036   AST 12 06/12/2021 1036   ALT 12 06/12/2021 1036   ALKPHOS 164 (H) 06/12/2021 1036   BILITOT 0.3 06/12/2021 1036   GFRNONAA 55 (L) 10/20/2014 1039   GFRAA >60 10/20/2014 1039     No results found.     Assessment & Plan:   1. Lymphedema The patient still continues to have issues with lower extremity edema and she has some worsening wounds on her left lower extremity.  We will continue to wrap the patient's left lower extremity in Unna boots.  She will continue to have this changed on a weekly basis.  She is advised to continue with elevation of her lower extremities to continue to help with control of swelling.  2. CHF (congestive heart failure), NYHA class I, chronic, diastolic (HCC) Despite adequate conservative therapy the patient's lower extremity edema is worsening.  She also has swelling of approaching her thigh area notes worsening shortness of breath.  There has also been some weight gain over the last several months.  Given the symptoms we will have the patient return to cardiology for evaluation.  She has previously seen Dr. Kirke Corin - Ambulatory referral to Cardiology  3. Chronic kidney disease, stage 3a (HCC) This also certainly exacerbates the patient's lower  extremity edema symptoms.  4. Type 2 diabetes mellitus with stage 3 chronic kidney disease, with long-term current use of insulin, unspecified whether stage 3a or 3b CKD (HCC) Continue hypoglycemic  medications as already ordered, these medications have been reviewed and there are no changes at this time.  Hgb A1C to be monitored as already arranged by primary service   Current Outpatient Medications on File Prior to Visit  Medication Sig Dispense Refill   Alcohol Swabs (DROPSAFE ALCOHOL PREP) 70 % PADS USE TWICE DAILY 200 each 2   amLODipine (NORVASC) 10 MG tablet TAKE 1 TABLET (10 MG TOTAL) BY MOUTH DAILY. 90 tablet 1   atorvastatin (LIPITOR) 20 MG tablet TAKE 1 TABLET EVERY DAY 90 tablet 1   Blood Glucose Monitoring Suppl (TRUE METRIX METER) DEVI by Does not apply route.     Continuous Blood Gluc Sensor (DEXCOM G6 SENSOR) MISC 1 Device by Does not apply route See admin instructions. Change every 10 days 9 each 3   Cyanocobalamin (B-12 COMPLIANCE INJECTION IJ) Inject as directed.     diclofenac Sodium (VOLTAREN) 1 % GEL Voltaren 1 % topical gel  APPLY 2 GRAMS TO THE AFFECTED AREA(S) BY TOPICAL ROUTE 4 TIMES PER DAY     docusate sodium (COLACE) 100 MG capsule Take 100 mg by mouth daily as needed for mild constipation. Reported on 03/29/2015     DROPLET INSULIN SYRINGE 30G X 1/2" 1 ML MISC USE TO INJECT INSULIN EVERY DAY WITH BREAKFAST 100 each 2   furosemide (LASIX) 20 MG tablet TAKE 1 TABLET EVERY DAY AS NEEDED 90 tablet 0   glucose blood (TRUE METRIX BLOOD GLUCOSE TEST) test strip 1 each by Other route 4 (four) times daily. 400 each 1   insulin NPH-regular Human (NOVOLIN 70/30) (70-30) 100 UNIT/ML injection Inject 40 Units into the skin daily with breakfast. 50 mL 2   losartan (COZAAR) 50 MG tablet TAKE 1 TABLET EVERY DAY 90 tablet 1   metoprolol succinate (TOPROL-XL) 50 MG 24 hr tablet TAKE 1 TABLET EVERY DAY TAKE WITH OR IMMEDIATELY FOLLOWING A MEAL 90 tablet 3   triamcinolone cream  (KENALOG) 0.1 % APPLY TOPICALLY 4 (FOUR) TIMES DAILY AS NEEDED FOR ITCHING 80 g 2   TRUEplus Lancets 33G MISC TEST BLOOD SUGAR TWO TIMES DAILY 100 each 2   gabapentin (NEURONTIN) 100 MG capsule Take 1 capsule (100 mg total) by mouth 3 (three) times daily. (Patient not taking: Reported on 10/25/2021) 90 capsule 3   No current facility-administered medications on file prior to visit.    There are no Patient Instructions on file for this visit. No follow-ups on file.   Georgiana Spinner, NP

## 2021-12-10 ENCOUNTER — Ambulatory Visit (INDEPENDENT_AMBULATORY_CARE_PROVIDER_SITE_OTHER): Payer: Medicare HMO | Admitting: Nurse Practitioner

## 2021-12-10 ENCOUNTER — Encounter (INDEPENDENT_AMBULATORY_CARE_PROVIDER_SITE_OTHER): Payer: Self-pay | Admitting: Nurse Practitioner

## 2021-12-10 ENCOUNTER — Encounter (INDEPENDENT_AMBULATORY_CARE_PROVIDER_SITE_OTHER): Payer: Self-pay

## 2021-12-10 VITALS — BP 138/78 | HR 78 | Resp 16 | Wt 292.6 lb

## 2021-12-10 DIAGNOSIS — I890 Lymphedema, not elsewhere classified: Secondary | ICD-10-CM

## 2021-12-10 NOTE — Progress Notes (Signed)
History of Present Illness  There is no documented history at this time  Assessments & Plan   There are no diagnoses linked to this encounter.    Additional instructions  Subjective:  Patient presents with venous ulcer of the Left lower extremity.    Procedure:  3 layer unna wrap was placed Left lower extremity.   Plan:   Follow up in one week.  

## 2021-12-16 ENCOUNTER — Encounter (INDEPENDENT_AMBULATORY_CARE_PROVIDER_SITE_OTHER): Payer: Self-pay | Admitting: Nurse Practitioner

## 2021-12-17 ENCOUNTER — Ambulatory Visit (INDEPENDENT_AMBULATORY_CARE_PROVIDER_SITE_OTHER): Payer: Medicare HMO

## 2021-12-17 ENCOUNTER — Encounter (INDEPENDENT_AMBULATORY_CARE_PROVIDER_SITE_OTHER): Payer: Self-pay

## 2021-12-24 ENCOUNTER — Encounter (INDEPENDENT_AMBULATORY_CARE_PROVIDER_SITE_OTHER): Payer: Medicare HMO

## 2021-12-26 NOTE — Progress Notes (Unsigned)
Patient ID: Bethany BuffMary E Hawker, female   DOB: Jan 18, 1945, 76 y.o.   MRN: [id number]            Reason for Appointment: Type II Diabetes follow-up   History of Present Illness   Diagnosis date: 1998  Previous history:  Non-insulin hypoglycemic drugs previously used:metformin years ago, Ozempic Insulin was started in 2009 Previously has used NPH and regular insulin separately, no other insulin recently  A1c range in the last few years is: 7.8-10.3  Recent history:     Non-insulin hypoglycemic drugs: None     Insulin regimen: Novolin 70/30, 40 units in a.m.          Side effects from medications: Possibly diarrhea from metformin  Current self management, blood sugar patterns and problems identified:  A1c is 8.9 and higher She was given a trial of Rybelsus instead of Ozempic but she could not afford this because of high co-pay She thinks her blood sugars were better with this She was also tried on Farxiga but this was costing her over $200 and she did not take it after the first prescription in July Although she thinks she takes her insulin consistently in the morning her blood sugars as seen below on the freestyle [employer] download are highly inconsistent with no pattern Generally having postprandial hyperglycemia which is variable Some readings are higher because of consuming sweet tea such as yesterday at lunchtime Also her meal sizes are variable Waiting to see the dietitian today Overall hypoglycemia has been minimal  Exercise: Minimal  Diet management: Not following any meal plan     Hypoglycemia: None  Analysis of her freestyle libre sensor download as follows  Blood sugar data is available between 9/17 through 9/22 with incomplete data on 9/20 Blood sugars are highly inconsistent from day-to-day She had generally a very good even control on 9/19 with average glucose 152 and only mild hyperglycemia after lunch However glucose was persistently high on 9/17 averaging 340   Generally appears to have somewhat high readings inconsistently after meals at different times Overnight blood sugars are not consistent with higher readings early part of the night and variable readings the rest of the night including low normal reading earlier this morning  Premeal readings are generally higher at dinnertime but low normal on 9/20 Only this morning had mild transient hypoglycemia at 5 AM   CGM use % of time   2-week average/GV 207/40  Time in range       49%  % Time Above 180 23+27  % Time above 250   % Time Below 70 1     PRE-MEAL Fasting Lunch Dinner Bedtime Overall  Glucose range:       Averages: 128  220     POST-MEAL PC Breakfast PC Lunch PC Dinner  Glucose range:     Averages:  249 231   Previously:   CGM use % of time   2-week average/GV 167  Time in range 65       %  % Time Above 180 30  % Time above 250 5  % Time Below 70 0     PRE-MEAL Fasting Lunch Dinner Bedtime Overall  Glucose range:       Averages: 171  105  167   POST-MEAL PC Breakfast PC Lunch PC Dinner  Glucose range:     Averages: 208  211     Dietician visit: Most recent: 09/2021     Weight control:  Wt Readings from  Last 3 Encounters:  12/10/21 292 lb 9.6 oz (132.7 kg)  12/03/21 300 lb (136.1 kg)  11/26/21 (!) 302 lb (137 kg)            Diabetes labs:  Lab Results  Component Value Date   HGBA1C 8.7 (A) 10/05/2021   HGBA1C 8.2 (H) 07/09/2021   HGBA1C 7.8 (A) 04/03/2021   Lab Results  Component Value Date   MICROALBUR 9.7 (H) 07/09/2021   LDLCALC 65 07/09/2021   CREATININE 1.01 (H) 06/12/2021     Allergies as of 12/27/2021   No Known Allergies      Medication List        Accurate as of December 27, 2021  1:24 PM. If you have any questions, ask your nurse or doctor.          amLODipine 10 MG tablet Commonly known as: NORVASC TAKE 1 TABLET (10 MG TOTAL) BY MOUTH DAILY.   atorvastatin 20 MG tablet Commonly known as: LIPITOR TAKE 1 TABLET  EVERY DAY   B-12 COMPLIANCE INJECTION IJ Inject as directed.   Dexcom G6 Sensor Misc 1 Device by Does not apply route See admin instructions. Change every 10 days   diclofenac Sodium 1 % Gel Commonly known as: VOLTAREN Voltaren 1 % topical gel  APPLY 2 GRAMS TO THE AFFECTED AREA(S) BY TOPICAL ROUTE 4 TIMES PER DAY   docusate sodium 100 MG capsule Commonly known as: COLACE Take 100 mg by mouth daily as needed for mild constipation. Reported on 03/29/2015   doxycycline 100 MG capsule Commonly known as: VIBRAMYCIN Take 1 capsule (100 mg total) by mouth 2 (two) times daily.   Droplet Insulin Syringe 30G X 1/2" 1 ML Misc Generic drug: Insulin Syringe-Needle U-100 USE TO INJECT INSULIN EVERY DAY WITH BREAKFAST   DropSafe Alcohol Prep 70 % Pads USE TWICE DAILY   furosemide 20 MG tablet Commonly known as: LASIX TAKE 1 TABLET EVERY DAY AS NEEDED   gabapentin 100 MG capsule Commonly known as: NEURONTIN Take 1 capsule (100 mg total) by mouth 3 (three) times daily.   losartan 50 MG tablet Commonly known as: COZAAR TAKE 1 TABLET EVERY DAY   metoprolol succinate 50 MG 24 hr tablet Commonly known as: TOPROL-XL TAKE 1 TABLET EVERY DAY TAKE WITH OR IMMEDIATELY FOLLOWING A MEAL   NovoLIN 70/30 (70-30) 100 UNIT/ML injection Generic drug: insulin NPH-regular Human Inject 40 Units into the skin daily with breakfast.   triamcinolone cream 0.1 % Commonly known as: KENALOG APPLY TOPICALLY 4 (FOUR) TIMES DAILY AS NEEDED FOR ITCHING   True Metrix Blood Glucose Test test strip Generic drug: glucose blood 1 each by Other route 4 (four) times daily.   True Metrix Meter Devi by Does not apply route.   TRUEplus Lancets 33G Misc TEST BLOOD SUGAR TWO TIMES DAILY        Allergies: No Known Allergies  Past Medical History:  Diagnosis Date   Arthritis    Chronic kidney disease    Diabetes mellitus without complication (HCC)    Type II   Fibromyalgia    GERD (gastroesophageal  reflux disease)    better after gall bladder surgery-    Hyperlipidemia    Hypertension    Shortness of breath dyspnea    with exertion    Past Surgical History:  Procedure Laterality Date   ABDOMINAL HYSTERECTOMY  01/15/1995   CATARACT EXTRACTION Bilateral 01/15/2012   CHOLECYSTECTOMY  01/15/2008   COLONOSCOPY     COLONOSCOPY WITH PROPOFOL N/A  04/21/2015   Procedure: COLONOSCOPY WITH PROPOFOL;  Surgeon: Wallace Cullens, MD;  Location: [facility];  Service: Gastroenterology;  Laterality: N/A;   EYE SURGERY Bilateral    Cataract   LUMBAR WOUND DEBRIDEMENT N/A 10/20/2014   Procedure: LUMBAR WOUND DEBRIDEMENT;  Surgeon: Tia Alert, MD;  Location: [facility];  Service: Neurosurgery;  Laterality: N/A;   MAXIMUM ACCESS (MAS)POSTERIOR LUMBAR INTERBODY FUSION (PLIF) 1 LEVEL N/A 09/16/2014   Procedure: L/4-5 FOR MAXIMUM ACCESS (MAS) POSTERIOR LUMBAR INTERBODY FUSION (PLIF) 1 LEVEL;  Surgeon: Tia Alert, MD;  Location: [facility];  Service: Neurosurgery;  Laterality: N/A;  L/4-5 FOR MAXIMUM ACCESS (MAS) POSTERIOR LUMBAR INTERBODY FUSION (PLIF) 1 LEVEL   REFRACTIVE SURGERY Left     Family History  Problem Relation Age of Onset   Arthritis Mother    Cancer Mother        Ovarian   Diabetes Mother    Heart attack Father    Heart disease Father    Hypertension Father    Cancer Brother    Diabetes Brother     Social History:  reports that she has never smoked. She has never used smokeless tobacco. She reports that she does not drink alcohol and does not use drugs.  Review of Systems:  Last diabetic eye exam date 2/22  Last foot exam date: 10/2020  Symptoms of neuropathy: Some numbness present in her feet  Hypertension:   Treatment includes Losartan 50 mg daily   BP Readings from Last 3 Encounters:  12/10/21 138/78  12/03/21 (!) 154/80  11/26/21 131/75   Takes Lasix only occasionally as she does not have significant edema No recent change in renal function  Lab  Results  Component Value Date   CREATININE 1.01 (H) 06/12/2021   CREATININE 1.21 (H) 01/27/2020   CREATININE 1.11 08/26/2019    Lipid management: Taking 20 mg Lipitor    Lab Results  Component Value Date   CHOL 139 07/09/2021   CHOL 139 06/12/2021   CHOL 126 01/27/2020   Lab Results  Component Value Date   HDL 41.70 07/09/2021   HDL 41 06/12/2021   HDL 49.90 01/27/2020   Lab Results  Component Value Date   LDLCALC 65 07/09/2021   LDLCALC 72 06/12/2021   LDLCALC 52 01/27/2020   Lab Results  Component Value Date   TRIG 164.0 (H) 07/09/2021   TRIG 151 (H) 06/12/2021   TRIG 118.0 01/27/2020   Lab Results  Component Value Date   CHOLHDL 3 07/09/2021   CHOLHDL 3 01/27/2020   CHOLHDL 3 06/29/2018   No results found for: "LDLDIRECT"   Examination:   There were no vitals taken for this visit.  There is no height or weight on file to calculate BMI.    ASSESSMENT/ PLAN:    Diabetes type 2 with severe obesity:   Current regimen: Novolin 70/30 40 units in a.m. only  See history of present illness for detailed discussion of current diabetes management, blood sugar patterns and problems identified  A1c is consistently high and now 8.7, relatively higher  Blood glucose control is still poor and inconsistent As discussed above her blood sugars were evaluated from freestyle libre sensor but she has difficulty with controlling readings after meals based on her diet and conjunction of sweetened drinks Also unable to lose weight Has significant difficulty affording medications that are brand-name because of likely being in the donut hole She is also getting the freestyle libre sensor from the local  pharmacy instead of DME supplier  Currently cannot afford SGLT2 with also  Recommendations:  She does need to stop drinking sweet tea and other regular soft drinks Discussed the importance of weight loss DIABETES education: She will be seeing nutritionist today She will try  Ozempic again at 0.25 mg dose at least for some time and if she is tolerating it well may consider increasing it further on the next visit If she starts getting low blood sugars will need to reduce her insulin to 35 Does need to get her freestyle libre sensor from a DME supplier instead of the pharmacy and discussed that this should be covered better  There are no Patient Instructions on file for this visit.  Total visit time including counseling = 30 minutes  Elayne Snare 12/27/2021, 1:24 PM   Addendum: Her co-pay is high on Ozempic and she will apply for patient assistance

## 2021-12-27 ENCOUNTER — Ambulatory Visit: Payer: Medicare HMO | Admitting: Endocrinology

## 2021-12-27 ENCOUNTER — Telehealth: Payer: Self-pay | Admitting: Endocrinology

## 2021-12-27 NOTE — Telephone Encounter (Signed)
Patient says that she does not want to reschedule the appointment for today at this time.  Patient also states that she is upset that she was not seen today because she felt like labs today would suffice for her not having her meter.  She says that she is leaving to go out of town tomorrow and she's not sure what her schedule will be when she comes back in town.  She will call back at a later date to reschedule.

## 2021-12-31 ENCOUNTER — Encounter (INDEPENDENT_AMBULATORY_CARE_PROVIDER_SITE_OTHER): Payer: Medicare HMO

## 2022-01-09 ENCOUNTER — Ambulatory Visit (INDEPENDENT_AMBULATORY_CARE_PROVIDER_SITE_OTHER): Payer: Medicare HMO | Admitting: Nurse Practitioner

## 2022-01-09 ENCOUNTER — Encounter (INDEPENDENT_AMBULATORY_CARE_PROVIDER_SITE_OTHER): Payer: Self-pay | Admitting: Nurse Practitioner

## 2022-01-09 VITALS — BP 157/75 | HR 76 | Resp 16 | Wt 297.0 lb

## 2022-01-09 DIAGNOSIS — I5032 Chronic diastolic (congestive) heart failure: Secondary | ICD-10-CM

## 2022-01-09 DIAGNOSIS — N1831 Chronic kidney disease, stage 3a: Secondary | ICD-10-CM | POA: Diagnosis not present

## 2022-01-09 DIAGNOSIS — N183 Chronic kidney disease, stage 3 unspecified: Secondary | ICD-10-CM

## 2022-01-09 DIAGNOSIS — Z794 Long term (current) use of insulin: Secondary | ICD-10-CM

## 2022-01-09 DIAGNOSIS — I890 Lymphedema, not elsewhere classified: Secondary | ICD-10-CM | POA: Diagnosis not present

## 2022-01-09 DIAGNOSIS — E1122 Type 2 diabetes mellitus with diabetic chronic kidney disease: Secondary | ICD-10-CM | POA: Diagnosis not present

## 2022-01-13 ENCOUNTER — Encounter (INDEPENDENT_AMBULATORY_CARE_PROVIDER_SITE_OTHER): Payer: Self-pay | Admitting: Nurse Practitioner

## 2022-01-13 NOTE — Progress Notes (Signed)
Subjective:    Patient ID: Bethany [name], female    DOB: 04-22-1945, 76 y.o.   MRN: [id number] Chief Complaint  Patient presents with   Follow-up    Unnaboot check    Bethany [name] is a 76 year old female who returns today for evaluation of her lower extremity edema.  The patient was in bilateral Unna boots due to ulceration but she was removed several weeks ago.  She notes that she has been wearing medical grade compression.  Continues to elevate her lower extremities.  She visited her nephrologist recently who increased her Lasix and she has an upcoming visit to her cardiologist.  She continues to remain free from any venous ulcers.  She notes that since increasing her diuretic she has some swelling but it is improved.     Review of Systems  Cardiovascular:  Positive for leg swelling.  Musculoskeletal:  Positive for arthralgias.  All other systems reviewed and are negative.      Objective:   Physical Exam Vitals reviewed.  HENT:     Head: Normocephalic.  Cardiovascular:     Rate and Rhythm: Normal rate.  Pulmonary:     Effort: Pulmonary effort is normal.  Musculoskeletal:     Right lower leg: Edema present.     Left lower leg: Edema present.  Skin:    General: Skin is warm and dry.  Neurological:     Mental Status: She is alert and oriented to person, place, and time.  Psychiatric:        Mood and Affect: Mood normal.        Behavior: Behavior normal.        Thought Content: Thought content normal.        Judgment: Judgment normal.     BP (!) 157/75 (BP Location: Left Arm)   Pulse 76   Resp 16   Wt 297 lb (134.7 kg)   BMI 46.52 kg/m   Past Medical History:  Diagnosis Date   Arthritis    Chronic kidney disease    Diabetes mellitus without complication (HCC)    Type II   Fibromyalgia    GERD (gastroesophageal reflux disease)    better after gall bladder surgery-    Hyperlipidemia    Hypertension    Shortness of breath dyspnea    with exertion     Social History   Socioeconomic History   Marital status: Widowed    Spouse name: Not on file   Number of children: Not on file   Years of education: Not on file   Highest education level: Not on file  Occupational History   Not on file  Tobacco Use   Smoking status: Never   Smokeless tobacco: Never  Vaping Use   Vaping Use: Never used  Substance and Sexual Activity   Alcohol use: No   Drug use: No   Sexual activity: Not Currently  Other Topics Concern   Not on file  Social History Narrative   Not on file   Social Determinants of Health   Financial Resource Strain: Not on file  Food Insecurity: Not on file  Transportation Needs: Not on file  Physical Activity: Not on file  Stress: Not on file  Social Connections: Not on file  Intimate Partner Violence: Not on file    Past Surgical History:  Procedure Laterality Date   ABDOMINAL HYSTERECTOMY  01/15/1995   CATARACT EXTRACTION Bilateral 01/15/2012   CHOLECYSTECTOMY  01/15/2008   COLONOSCOPY  COLONOSCOPY WITH PROPOFOL N/A 04/21/2015   Procedure: COLONOSCOPY WITH PROPOFOL;  Surgeon: Wallace Cullens, MD;  Location: [facility] ENDOSCOPY;  Service: Gastroenterology;  Laterality: N/A;   EYE SURGERY Bilateral    Cataract   LUMBAR WOUND DEBRIDEMENT N/A 10/20/2014   Procedure: LUMBAR WOUND DEBRIDEMENT;  Surgeon: Tia Alert, MD;  Location: [facility]URO ORS;  Service: Neurosurgery;  Laterality: N/A;   MAXIMUM ACCESS (MAS)POSTERIOR LUMBAR INTERBODY FUSION (PLIF) 1 LEVEL N/A 09/16/2014   Procedure: L/4-5 FOR MAXIMUM ACCESS (MAS) POSTERIOR LUMBAR INTERBODY FUSION (PLIF) 1 LEVEL;  Surgeon: Tia Alert, MD;  Location: [facility]UR[facility] OR[facility];  Service: Neurosurgery;  Laterality: N/A;  L/4-5 FOR MAXIMUM ACCESS (MAS) POSTERIOR LUMBAR INTERBODY FUSION (PLIF) 1 LEVEL   REFRACTIVE SURGERY Left     Family History  Problem Relation Age of Onset   Arthritis Mother    Cancer Mother        Ovarian   Diabetes Mother    Heart attack Father    Heart  disease Father    Hypertension Father    Cancer Brother    Diabetes Brother     No Known Allergies     Latest Ref Rng & Units 06/12/2021   10:36 AM 01/27/2020    4:18 PM 06/29/2018   12:31 PM  CBC  WBC 3.4 - 10.8 x10E3/uL 8.0  8.6  7.3   Hemoglobin 11.1 - 15.9 g/dL 53.6  14.4  31.5   Hematocrit 34.0 - 46.6 % 39.1  37.7  38.0   Platelets 150 - 450 x10E3/uL 315  270.0  279.0       CMP     Component Value Date/Time   NA 144 06/12/2021 1036   K 4.8 06/12/2021 1036   CL 103 06/12/2021 1036   CO2 23 06/12/2021 1036   GLUCOSE 198 (H) 07/09/2021 1119   BUN 18 06/12/2021 1036   CREATININE 1.01 (H) 06/12/2021 1036   CALCIUM 9.8 06/12/2021 1036   PROT 7.4 06/12/2021 1036   ALBUMIN 4.2 06/12/2021 1036   AST 12 06/12/2021 1036   ALT 12 06/12/2021 1036   ALKPHOS 164 (H) 06/12/2021 1036   BILITOT 0.3 06/12/2021 1036   GFRNONAA 55 (L) 10/20/2014 1039   GFRAA >60 10/20/2014 1039     No results found.     Assessment & Plan:   1. Lymphedema Recommend:  No surgery or intervention at this point in time.    I have reviewed my discussion with the patient regarding venous insufficiency and secondary lymph edema and why it  causes symptoms. I have discussed with the patient the chronic skin changes that accompany these problems and the long term sequela such as ulceration and infection.  Patient will continue wearing graduated compression on a daily basis a prescription, if needed, was given to the patient to keep this updated. The patient will  put the compression on first thing in the morning and removing them in the evening. The patient is instructed specifically not to sleep in the compression.  In addition, behavioral modification including elevation during the day will be continued.  Diet and salt restriction will also be helpful.  She will continue to utilize her lymphedema pump as well.  Will see her back in 3 months.   2. CHF (congestive heart failure), NYHA class I, chronic,  diastolic (HCC) The patient has upcoming office visit with cardiologist  3. Chronic kidney disease, stage 3a (HCC) This also likely contributes to component to her swelling is not  4.  Type 2 diabetes mellitus with stage 3 chronic kidney disease, with long-term current use of insulin, unspecified whether stage 3a or 3b CKD (HCC) Continue hypoglycemic medications as already ordered, these medications have been reviewed and there are no changes at this time.  Hgb A1C to be monitored as already arranged by primary service   Current Outpatient Medications on File Prior to Visit  Medication Sig Dispense Refill   Alcohol Swabs (DROPSAFE ALCOHOL PREP) 70 % PADS USE TWICE DAILY 200 each 2   amLODipine (NORVASC) 10 MG tablet TAKE 1 TABLET (10 MG TOTAL) BY MOUTH DAILY. 90 tablet 1   atorvastatin (LIPITOR) 20 MG tablet TAKE 1 TABLET EVERY DAY 90 tablet 1   Blood Glucose Monitoring Suppl (TRUE METRIX METER) DEVI by Does not apply route.     Continuous Blood Gluc Sensor (DEXCOM G6 SENSOR) MISC 1 Device by Does not apply route See admin instructions. Change every 10 days 9 each 3   Cyanocobalamin (B-12 COMPLIANCE INJECTION IJ) Inject as directed.     diclofenac Sodium (VOLTAREN) 1 % GEL Voltaren 1 % topical gel  APPLY 2 GRAMS TO THE AFFECTED AREA(S) BY TOPICAL ROUTE 4 TIMES PER DAY     docusate sodium (COLACE) 100 MG capsule Take 100 mg by mouth daily as needed for mild constipation. Reported on 03/29/2015     doxycycline (VIBRAMYCIN) 100 MG capsule Take 1 capsule (100 mg total) by mouth 2 (two) times daily. 20 capsule 0   DROPLET INSULIN SYRINGE 30G X 1/2" 1 ML MISC USE TO INJECT INSULIN EVERY DAY WITH BREAKFAST 100 each 2   furosemide (LASIX) 20 MG tablet TAKE 1 TABLET EVERY DAY AS NEEDED 90 tablet 0   glucose blood (TRUE METRIX BLOOD GLUCOSE TEST) test strip 1 each by Other route 4 (four) times daily. 400 each 1   insulin NPH-regular Human (NOVOLIN 70/30) (70-30) 100 UNIT/ML injection Inject 40 Units  into the skin daily with breakfast. 50 mL 2   losartan (COZAAR) 50 MG tablet TAKE 1 TABLET EVERY DAY 90 tablet 1   metoprolol succinate (TOPROL-XL) 50 MG 24 hr tablet TAKE 1 TABLET EVERY DAY TAKE WITH OR IMMEDIATELY FOLLOWING A MEAL 90 tablet 3   triamcinolone cream (KENALOG) 0.1 % APPLY TOPICALLY 4 (FOUR) TIMES DAILY AS NEEDED FOR ITCHING 80 g 2   TRUEplus Lancets 33G MISC TEST BLOOD SUGAR TWO TIMES DAILY 100 each 2   gabapentin (NEURONTIN) 100 MG capsule Take 1 capsule (100 mg total) by mouth 3 (three) times daily. (Patient not taking: Reported on 10/25/2021) 90 capsule 3   No current facility-administered medications on file prior to visit.    There are no Patient Instructions on file for this visit. No follow-ups on file.   Georgiana Spinner, NP

## 2022-01-21 ENCOUNTER — Ambulatory Visit (INDEPENDENT_AMBULATORY_CARE_PROVIDER_SITE_OTHER): Payer: Medicare HMO | Admitting: Physician Assistant

## 2022-01-21 ENCOUNTER — Encounter: Payer: Self-pay | Admitting: Physician Assistant

## 2022-01-21 VITALS — BP 146/70 | HR 75 | Temp 97.8°F | Resp 16 | Ht 67.0 in | Wt 300.2 lb

## 2022-01-21 DIAGNOSIS — E1151 Type 2 diabetes mellitus with diabetic peripheral angiopathy without gangrene: Secondary | ICD-10-CM | POA: Diagnosis not present

## 2022-01-21 DIAGNOSIS — I10 Essential (primary) hypertension: Secondary | ICD-10-CM

## 2022-01-21 DIAGNOSIS — I890 Lymphedema, not elsewhere classified: Secondary | ICD-10-CM | POA: Diagnosis not present

## 2022-01-21 DIAGNOSIS — G8929 Other chronic pain: Secondary | ICD-10-CM | POA: Diagnosis not present

## 2022-01-21 DIAGNOSIS — M5441 Lumbago with sciatica, right side: Secondary | ICD-10-CM | POA: Diagnosis not present

## 2022-01-21 DIAGNOSIS — N1831 Chronic kidney disease, stage 3a: Secondary | ICD-10-CM

## 2022-01-21 NOTE — Progress Notes (Signed)
[facility] [address] [place], [place] [address]  Internal MEDICINE  Office Visit Note  Patient Name: Bethany [name]  [id number]  [id number]  Date of Service: 01/21/2022  Chief Complaint  Patient presents with   Follow-up   Diabetes   Gastroesophageal Reflux   Hyperlipidemia   Hypertension   Quality Metric Gaps    TDAP, Shingles, Pneumonia, Eye and Foot Exams    HPI Pt is here for routine follow up -requests new endo referral, states she was not seen at last scheduled endo visit due to not having her meter with her and would like to establish elsewhere. Will need foot exam updated as well with her diabetes management -Sees vascular regularly for lymphedema management -Sees cardiology at the end of the month, has not taken BP meds yet today which is why it is elevated. Reports nephrology had increased her lasix to 40mg  daily -had laser surgery on eyes last year and reports having eye exam upon follow up however no date on record -plans to get shingles vaccine, PNA vaccine, flu and covid vaccines as able. Discussed tdap as well -Does have arthritis in hands and continue to have low back pain radiating into right leg. Gabapentin did not seem to help and feels tylenol did better. May schedule ortho follow up for low back to see if any non-surgical options as she doesn't want more surgery and already had hardware placed previously  Current Medication: Outpatient Encounter Medications as of 01/21/2022  Medication Sig   Alcohol Swabs (DROPSAFE ALCOHOL PREP) 70 % PADS USE TWICE DAILY   amLODipine (NORVASC) 10 MG tablet TAKE 1 TABLET (10 MG TOTAL) BY MOUTH DAILY.   atorvastatin (LIPITOR) 20 MG tablet TAKE 1 TABLET EVERY DAY   Blood Glucose Monitoring Suppl (TRUE METRIX METER) DEVI by Does not apply route.   Continuous Blood Gluc Sensor (DEXCOM G6 SENSOR) MISC 1 Device by Does not apply route See admin instructions. Change every 10 days   Cyanocobalamin (B-12 COMPLIANCE  INJECTION IJ) Inject as directed.   diclofenac Sodium (VOLTAREN) 1 % GEL Voltaren 1 % topical gel  APPLY 2 GRAMS TO THE AFFECTED AREA(S) BY TOPICAL ROUTE 4 TIMES PER DAY   docusate sodium (COLACE) 100 MG capsule Take 100 mg by mouth daily as needed for mild constipation. Reported on 03/29/2015   doxycycline (VIBRAMYCIN) 100 MG capsule Take 1 capsule (100 mg total) by mouth 2 (two) times daily.   DROPLET INSULIN SYRINGE 30G X 1/2" 1 ML MISC USE TO INJECT INSULIN EVERY DAY WITH BREAKFAST   furosemide (LASIX) 20 MG tablet TAKE 1 TABLET EVERY DAY AS NEEDED   gabapentin (NEURONTIN) 100 MG capsule Take 1 capsule (100 mg total) by mouth 3 (three) times daily.   glucose blood (TRUE METRIX BLOOD GLUCOSE TEST) test strip 1 each by Other route 4 (four) times daily.   insulin NPH-regular Human (NOVOLIN 70/30) (70-30) 100 UNIT/ML injection Inject 40 Units into the skin daily with breakfast.   losartan (COZAAR) 50 MG tablet TAKE 1 TABLET EVERY DAY   metoprolol succinate (TOPROL-XL) 50 MG 24 hr tablet TAKE 1 TABLET EVERY DAY TAKE WITH OR IMMEDIATELY FOLLOWING A MEAL   triamcinolone cream (KENALOG) 0.1 % APPLY TOPICALLY 4 (FOUR) TIMES DAILY AS NEEDED FOR ITCHING   TRUEplus Lancets 33G MISC TEST BLOOD SUGAR TWO TIMES DAILY   No facility-administered encounter medications on file as of 01/21/2022.    Surgical History: Past Surgical History:  Procedure Laterality Date   ABDOMINAL HYSTERECTOMY  01/15/1995   CATARACT EXTRACTION Bilateral 01/15/2012   CHOLECYSTECTOMY  01/15/2008   COLONOSCOPY     COLONOSCOPY WITH PROPOFOL N/A 04/21/2015   Procedure: COLONOSCOPY WITH PROPOFOL;  Surgeon: Hulen Luster, MD;  Location: [facility] ENDOSCOPY;  Service: Gastroenterology;  Laterality: N/A;   EYE SURGERY Bilateral    Cataract   LUMBAR WOUND DEBRIDEMENT N/A 10/20/2014   Procedure: LUMBAR WOUND DEBRIDEMENT;  Surgeon: Eustace Moore, MD;  Location: [place] [facility];  Service: Neurosurgery;  Laterality: N/A;   MAXIMUM ACCESS  (MAS)POSTERIOR LUMBAR INTERBODY FUSION (PLIF) 1 LEVEL N/A 09/16/2014   Procedure: L/4-5 FOR MAXIMUM ACCESS (MAS) POSTERIOR LUMBAR INTERBODY FUSION (PLIF) 1 LEVEL;  Surgeon: Eustace Moore, MD;  Location: [place] [facility];  Service: Neurosurgery;  Laterality: N/A;  L/4-5 FOR MAXIMUM ACCESS (MAS) POSTERIOR LUMBAR INTERBODY FUSION (PLIF) 1 LEVEL   REFRACTIVE SURGERY Left     Medical History: Past Medical History:  Diagnosis Date   Arthritis    Chronic kidney disease    Diabetes mellitus without complication (HCC)    Type II   Fibromyalgia    GERD (gastroesophageal reflux disease)    better after gall bladder surgery-    Hyperlipidemia    Hypertension    Shortness of breath dyspnea    with exertion    Family History: Family History  Problem Relation Age of Onset   Arthritis Mother    Cancer Mother        Ovarian   Diabetes Mother    Heart attack Father    Heart disease Father    Hypertension Father    Cancer Brother    Diabetes Brother     Social History   Socioeconomic History   Marital status: Widowed    Spouse name: Not on file   Number of children: Not on file   Years of education: Not on file   Highest education level: Not on file  Occupational History   Not on file  Tobacco Use   Smoking status: Never   Smokeless tobacco: Never  Vaping Use   Vaping Use: Never used  Substance and Sexual Activity   Alcohol use: No   Drug use: No   Sexual activity: Not Currently  Other Topics Concern   Not on file  Social History Narrative   Not on file   Social Determinants of Health   Financial Resource Strain: Not on file  Food Insecurity: Not on file  Transportation Needs: Not on file  Physical Activity: Not on file  Stress: Not on file  Social Connections: Not on file  Intimate Partner Violence: Not on file      Review of Systems  Constitutional:  Negative for chills, fatigue and unexpected weight change.  HENT:  Positive for postnasal drip. Negative for  congestion, rhinorrhea, sneezing and sore throat.   Eyes:  Negative for redness.  Respiratory:  Negative for cough, chest tightness and shortness of breath.   Cardiovascular:  Positive for leg swelling. Negative for chest pain and palpitations.  Gastrointestinal:  Negative for abdominal pain, constipation, diarrhea, nausea and vomiting.  Genitourinary:  Negative for dysuria and frequency.  Musculoskeletal:  Positive for arthralgias, back pain and gait problem. Negative for joint swelling and neck pain.  Skin:  Negative for rash.  Neurological:  Negative for tremors and numbness.  Hematological:  Negative for adenopathy. Does not bruise/bleed easily.  Psychiatric/Behavioral:  Negative for behavioral problems (Depression), sleep disturbance and suicidal ideas. The patient is not nervous/anxious.  Vital Signs: BP (!) 146/70 Comment: 166/71  Pulse 75   Temp 97.8 F (36.6 C)   Resp 16   Ht 5\' 7"  (1.702 m)   Wt (!) 300 lb 3.2 oz (136.2 kg)   SpO2 99%   BMI 47.02 kg/m    Physical Exam Vitals and nursing note reviewed.  Constitutional:      General: She is not in acute distress.    Appearance: She is well-developed. She is obese. She is not diaphoretic.  HENT:     Head: Normocephalic and atraumatic.     Mouth/Throat:     Pharynx: No oropharyngeal exudate.  Eyes:     Pupils: Pupils are equal, round, and reactive to light.  Neck:     Thyroid: No thyromegaly.     Vascular: No JVD.     Trachea: No tracheal deviation.  Cardiovascular:     Rate and Rhythm: Normal rate and regular rhythm.     Heart sounds: Normal heart sounds. No murmur heard.    No friction rub. No gallop.  Pulmonary:     Effort: Pulmonary effort is normal. No respiratory distress.     Breath sounds: No wheezing or rales.  Chest:     Chest wall: No tenderness.  Abdominal:     General: Bowel sounds are normal.     Palpations: Abdomen is soft.  Musculoskeletal:        General: Normal range of motion.      Cervical back: Normal range of motion and neck supple.  Lymphadenopathy:     Cervical: No cervical adenopathy.  Skin:    General: Skin is warm and dry.  Neurological:     Mental Status: She is alert and oriented to person, place, and time.     Cranial Nerves: No cranial nerve deficit.  Psychiatric:        Behavior: Behavior normal.        Thought Content: Thought content normal.        Judgment: Judgment normal.        Assessment/Plan: 1. Diabetes mellitus with peripheral vascular disease (HCC) Will refer to new endocrinologist for further diabetic management - Ambulatory referral to Endocrinology  2. Benign hypertension Elevated in office, but has not taken BP meds yet and will do so now  3. Chronic right-sided low back pain with right-sided sciatica May titrate up on gabapentin and take 2-3caps as needed though monitor for S/E such as grogginess. Will follow up with ortho  4. Stage 3a chronic kidney disease (HCC) Followed by nephrology  5. Lymphedema of both lower extremities Followed by vascular   General Counseling: Bethany [name] verbalizes understanding of the findings of todays visit and agrees with plan of treatment. I have discussed any further diagnostic evaluation that may be needed or ordered today. We also reviewed her medications today. she has been encouraged to call the office with any questions or concerns that should arise related to todays visit.    Orders Placed This Encounter  Procedures   Ambulatory referral to Endocrinology    No orders of the defined types were placed in this encounter.   This patient was seen by , PA-C in collaboration with Dr. Lynn Ito as a part of collaborative care agreement.   Total time spent:30 Minutes Time spent includes review of chart, medications, test results, and follow up plan with the patient.      Dr Beverely Risen Internal medicine

## 2022-01-22 ENCOUNTER — Telehealth: Payer: Self-pay | Admitting: Physician Assistant

## 2022-01-22 NOTE — Telephone Encounter (Signed)
Endocrinology referral faxed to Dr. Morayati ; [phone]-Toni 

## 2022-01-23 ENCOUNTER — Encounter: Payer: Self-pay | Admitting: Nurse Practitioner

## 2022-01-23 ENCOUNTER — Telehealth: Payer: Medicare HMO | Admitting: Nurse Practitioner

## 2022-01-23 VITALS — Resp 16 | Ht 67.0 in | Wt 296.0 lb

## 2022-01-23 DIAGNOSIS — U071 COVID-19: Secondary | ICD-10-CM

## 2022-01-23 DIAGNOSIS — R051 Acute cough: Secondary | ICD-10-CM

## 2022-01-23 DIAGNOSIS — J069 Acute upper respiratory infection, unspecified: Secondary | ICD-10-CM | POA: Diagnosis not present

## 2022-01-23 MED ORDER — MOLNUPIRAVIR EUA 200MG CAPSULE: 4.0000 | ORAL_CAPSULE | Freq: Two times a day (BID) | ORAL | 0 refills | Status: AC

## 2022-01-23 NOTE — Progress Notes (Signed)
[facility] [address] [place], [place] [address]  Internal MEDICINE  Telephone Visit  Patient Name: Bethany [name]  [id number]  [id number]  Date of Service: 01/23/2022  I connected with the patient at 1255 by telephone and verified the patients identity using two identifiers.   I discussed the limitations, risks, security and privacy concerns of performing an evaluation and management service by telephone and the availability of in person appointments. I also discussed with the patient that there may be a patient responsible charge related to the service.  The patient expressed understanding and agrees to proceed.    Chief Complaint  Patient presents with   Telephone Screen    Positive covid test, symptom started yesterday. Achy symptom. [phone]   Telephone Assessment    HPI Bethany [name] presents for a telehealth virtual visit for covid positive URI --tested covid positive --onset of symptoms was yesterday. --body aches,    Current Medication: Outpatient Encounter Medications as of 01/23/2022  Medication Sig   Alcohol Swabs (DROPSAFE ALCOHOL PREP) 70 % PADS USE TWICE DAILY   amLODipine (NORVASC) 10 MG tablet TAKE 1 TABLET (10 MG TOTAL) BY MOUTH DAILY.   atorvastatin (LIPITOR) 20 MG tablet TAKE 1 TABLET EVERY DAY   Blood Glucose Monitoring Suppl (TRUE METRIX METER) DEVI by Does not apply route.   Continuous Blood Gluc Sensor (DEXCOM G6 SENSOR) MISC 1 Device by Does not apply route See admin instructions. Change every 10 days   Cyanocobalamin (B-12 COMPLIANCE INJECTION IJ) Inject as directed.   diclofenac Sodium (VOLTAREN) 1 % GEL Voltaren 1 % topical gel  APPLY 2 GRAMS TO THE AFFECTED AREA(S) BY TOPICAL ROUTE 4 TIMES PER DAY   docusate sodium (COLACE) 100 MG capsule Take 100 mg by mouth daily as needed for mild constipation. Reported on 03/29/2015   doxycycline (VIBRAMYCIN) 100 MG capsule Take 1 capsule (100 mg total) by mouth 2 (two) times daily.   DROPLET INSULIN  SYRINGE 30G X 1/2" 1 ML MISC USE TO INJECT INSULIN EVERY DAY WITH BREAKFAST   furosemide (LASIX) 20 MG tablet TAKE 1 TABLET EVERY DAY AS NEEDED   gabapentin (NEURONTIN) 100 MG capsule Take 1 capsule (100 mg total) by mouth 3 (three) times daily.   glucose blood (TRUE METRIX BLOOD GLUCOSE TEST) test strip 1 each by Other route 4 (four) times daily.   insulin NPH-regular Human (NOVOLIN 70/30) (70-30) 100 UNIT/ML injection Inject 40 Units into the skin daily with breakfast.   losartan (COZAAR) 50 MG tablet TAKE 1 TABLET EVERY DAY   metoprolol succinate (TOPROL-XL) 50 MG 24 hr tablet TAKE 1 TABLET EVERY DAY TAKE WITH OR IMMEDIATELY FOLLOWING A MEAL   molnupiravir EUA (LAGEVRIO) 200 mg CAPS capsule Take 4 capsules (800 mg total) by mouth 2 (two) times daily for 5 days.   triamcinolone cream (KENALOG) 0.1 % APPLY TOPICALLY 4 (FOUR) TIMES DAILY AS NEEDED FOR ITCHING   TRUEplus Lancets 33G MISC TEST BLOOD SUGAR TWO TIMES DAILY   No facility-administered encounter medications on file as of 01/23/2022.    Surgical History: Past Surgical History:  Procedure Laterality Date   ABDOMINAL HYSTERECTOMY  01/15/1995   CATARACT EXTRACTION Bilateral 01/15/2012   CHOLECYSTECTOMY  01/15/2008   COLONOSCOPY     COLONOSCOPY WITH PROPOFOL N/A 04/21/2015   Procedure: COLONOSCOPY WITH PROPOFOL;  Surgeon: Wallace Cullens, MD;  Location: [facility] ENDOSCOPY;  Service: Gastroenterology;  Laterality: N/A;   EYE SURGERY Bilateral    Cataract   LUMBAR WOUND DEBRIDEMENT N/A 10/20/2014  Procedure: LUMBAR WOUND DEBRIDEMENT;  Surgeon: Eustace Moore, MD;  Location: [place] NEURO ORS;  Service: Neurosurgery;  Laterality: N/A;   MAXIMUM ACCESS (MAS)POSTERIOR LUMBAR INTERBODY FUSION (PLIF) 1 LEVEL N/A 09/16/2014   Procedure: L/4-5 FOR MAXIMUM ACCESS (MAS) POSTERIOR LUMBAR INTERBODY FUSION (PLIF) 1 LEVEL;  Surgeon: Eustace Moore, MD;  Location: [place] NEURO ORS;  Service: Neurosurgery;  Laterality: N/A;  L/4-5 FOR MAXIMUM ACCESS (MAS) POSTERIOR  LUMBAR INTERBODY FUSION (PLIF) 1 LEVEL   REFRACTIVE SURGERY Left     Medical History: Past Medical History:  Diagnosis Date   Arthritis    Chronic kidney disease    Diabetes mellitus without complication (HCC)    Type II   Fibromyalgia    GERD (gastroesophageal reflux disease)    better after gall bladder surgery-    Hyperlipidemia    Hypertension    Shortness of breath dyspnea    with exertion    Family History: Family History  Problem Relation Age of Onset   Arthritis Mother    Cancer Mother        Ovarian   Diabetes Mother    Heart attack Father    Heart disease Father    Hypertension Father    Cancer Brother    Diabetes Brother     Social History   Socioeconomic History   Marital status: Widowed    Spouse name: Not on file   Number of children: Not on file   Years of education: Not on file   Highest education level: Not on file  Occupational History   Not on file  Tobacco Use   Smoking status: Never   Smokeless tobacco: Never  Vaping Use   Vaping Use: Never used  Substance and Sexual Activity   Alcohol use: No   Drug use: No   Sexual activity: Not Currently  Other Topics Concern   Not on file  Social History Narrative   Not on file   Social Determinants of Health   Financial Resource Strain: Not on file  Food Insecurity: Not on file  Transportation Needs: Not on file  Physical Activity: Not on file  Stress: Not on file  Social Connections: Not on file  Intimate Partner Violence: Not on file      Review of Systems  Constitutional:  Positive for chills and fatigue. Negative for fever.  HENT:  Positive for congestion, postnasal drip, rhinorrhea, sinus pressure and sore throat. Negative for ear pain.   Respiratory:  Positive for cough. Negative for chest tightness, shortness of breath and wheezing.   Cardiovascular:  Negative for chest pain and palpitations.  Musculoskeletal:  Positive for myalgias.  Neurological:  Positive for headaches.     Vital Signs: Resp 16   Ht 5\' 7"  (1.702 m)   Wt 296 lb (134.3 kg)   BMI 46.36 kg/m    Observation/Objective: She is alert and oriented and engages in conversation appropriately. No acute distress noted.     Assessment/Plan: 1. Upper respiratory tract infection due to COVID-19 virus Antiviral medication ordered, take as prescribed.  - molnupiravir EUA (LAGEVRIO) 200 mg CAPS capsule; Take 4 capsules (800 mg total) by mouth 2 (two) times daily for 5 days.  Dispense: 40 capsule; Refill: 0  2. Acute cough May continue desired OTC medication   General Counseling: Jeannemarie verbalizes understanding of the findings of today's phone visit and agrees with plan of treatment. I have discussed any further diagnostic evaluation that may be needed or ordered today. We also reviewed  her medications today. she has been encouraged to call the office with any questions or concerns that should arise related to todays visit.  Return if symptoms worsen or fail to improve.   No orders of the defined types were placed in this encounter.   Meds ordered this encounter  Medications   molnupiravir EUA (LAGEVRIO) 200 mg CAPS capsule    Sig: Take 4 capsules (800 mg total) by mouth 2 (two) times daily for 5 days.    Dispense:  40 capsule    Refill:  0    Time spent:10 Minutes Time spent with patient included reviewing progress notes, labs, imaging studies, and discussing plan for follow up.  [place] Controlled Substance Database was reviewed by me for overdose risk score (ORS) if appropriate.  This patient was seen by Jonetta Osgood, FNP-C in collaboration with Dr. Clayborn Bigness as a part of collaborative care agreement.  Ociel Retherford R. Valetta Fuller, MSN, FNP-C Internal medicine

## 2022-01-26 ENCOUNTER — Other Ambulatory Visit: Payer: Self-pay | Admitting: Physician Assistant

## 2022-01-26 DIAGNOSIS — K219 Gastro-esophageal reflux disease without esophagitis: Secondary | ICD-10-CM

## 2022-01-30 ENCOUNTER — Other Ambulatory Visit: Payer: Self-pay | Admitting: Endocrinology

## 2022-01-30 DIAGNOSIS — E1165 Type 2 diabetes mellitus with hyperglycemia: Secondary | ICD-10-CM

## 2022-02-12 ENCOUNTER — Encounter: Payer: Self-pay | Admitting: Cardiovascular Disease

## 2022-02-12 ENCOUNTER — Ambulatory Visit: Payer: Medicare HMO | Attending: Cardiovascular Disease | Admitting: Cardiovascular Disease

## 2022-02-12 VITALS — BP 120/70 | HR 81 | Ht 67.5 in | Wt 298.2 lb

## 2022-02-12 DIAGNOSIS — I5032 Chronic diastolic (congestive) heart failure: Secondary | ICD-10-CM | POA: Diagnosis not present

## 2022-02-12 DIAGNOSIS — E785 Hyperlipidemia, unspecified: Secondary | ICD-10-CM

## 2022-02-12 DIAGNOSIS — I10 Essential (primary) hypertension: Secondary | ICD-10-CM | POA: Diagnosis not present

## 2022-02-12 MED ORDER — AMLODIPINE BESYLATE 2.5 MG PO TABS: 2.5000 mg | ORAL_TABLET | Freq: Every day | ORAL | 1 refills | Status: DC

## 2022-02-12 MED ORDER — LOSARTAN POTASSIUM 100 MG PO TABS: 100.0000 mg | ORAL_TABLET | Freq: Every day | ORAL | 1 refills | Status: DC

## 2022-02-12 NOTE — Patient Instructions (Signed)
Medication Instructions:  DECREASE the Amlodipine to 2.5 mg once daily  INCREASE the Losartan to 100 mg once daily  *If you need a refill on your cardiac medications before your next appointment, please call your pharmacy*   Lab Work: None ordered If you have labs (blood work) drawn today and your tests are completely normal, you will receive your results only by: [place] (if you have MyChart) OR A paper copy in the mail If you have any lab test that is abnormal or we need to change your treatment, we will call you to review the results.   Testing/Procedures: None ordered   Follow-Up: At [facility], you and your health needs are our priority.  As part of our continuing mission to provide you with exceptional heart care, we have created designated Provider Care Teams.  These Care Teams include your primary Cardiologist (physician) and Advanced Practice Providers (APPs -  Physician Assistants and Nurse Practitioners) who all work together to provide you with the care you need, when you need it.  We recommend signing up for the patient portal called "MyChart".  Sign up information is provided on this After Visit Summary.  MyChart is used to connect with patients for Virtual Visits (Telemedicine).  Patients are able to view lab/test results, encounter notes, upcoming appointments, etc.  Non-urgent messages can be sent to your provider as well.   To learn more about what you can do with MyChart, go to [email or web address].    Your next appointment:   6 month(s)  Provider:   You may see Dr. Fletcher Anon or one of the following Advanced Practice Providers on your designated Care Team:   Murray Hodgkins, NP Christell Faith, PA-C Cadence Kathlen Mody, PA-C Gerrie Nordmann, NP

## 2022-02-12 NOTE — Progress Notes (Signed)
Cardiology Office Note   Date:  02/12/2022   ID:  Bethany [name], Bethany [name], Bethany [name], MRN [id number]  PCP:  Mylinda Latina, PA-C  Cardiologist:   Kathlyn Sacramento, MD   Chief Complaint  Patient presents with   Other    12 month f/u no complaints today. Meds reviewed verbally with pt.      History of Present Illness: Bethany [name] is a 77 y.o. female who is here today for follow-up visit regarding exertional dyspnea.   She has known history of diabetes mellitus, stage III chronic kidney disease, essential hypertension, hyperlipidemia, morbid obesity, and obstructive sleep apnea. Previous echocardiogram in October 2019 showed normal LV systolic function, grade 1 diastolic dysfunction, normal pulmonary pressure and no significant valvular abnormalities. She reports having COVID in September 2021 and since then she had worsening shortness of breath, fatigue and gradual 30 pound weight gain with increased leg edema.    She had an echocardiogram in November 2022 which showed normal LV systolic function, grade 2 diastolic dysfunction and no significant valvular abnormalities.  She reports that her symptoms overall.  No chest pain.  She has chronic exertional dyspnea.  Biggest issue recently has been lower extremity edema with occasional blisters.  She was seen by vascular surgery and is being treated for lymphedema.  Past Medical History:  Diagnosis Date   Arthritis    Chronic kidney disease    Diabetes mellitus without complication (HCC)    Type II   Fibromyalgia    GERD (gastroesophageal reflux disease)    better after gall bladder surgery-    Hyperlipidemia    Hypertension    Shortness of breath dyspnea    with exertion    Past Surgical History:  Procedure Laterality Date   ABDOMINAL HYSTERECTOMY  [name]/[name]/1997   CATARACT EXTRACTION Bilateral [name]/[name]/2014   CHOLECYSTECTOMY  [name]/[name]/2010   COLONOSCOPY     COLONOSCOPY WITH PROPOFOL N/A 04/21/2015   Procedure: COLONOSCOPY WITH  PROPOFOL;  Surgeon: Hulen Luster, MD;  Location: [facility] ENDOSCOPY;  Service: Gastroenterology;  Laterality: N/A;   EYE SURGERY Bilateral    Cataract   LUMBAR WOUND DEBRIDEMENT N/A 10/20/2014   Procedure: LUMBAR WOUND DEBRIDEMENT;  Surgeon: Eustace Moore, MD;  Location: [place] NEURO ORS;  Service: Neurosurgery;  Laterality: N/A;   MAXIMUM ACCESS (MAS)POSTERIOR LUMBAR INTERBODY FUSION (PLIF) 1 LEVEL N/A 09/16/2014   Procedure: L/4-5 FOR MAXIMUM ACCESS (MAS) POSTERIOR LUMBAR INTERBODY FUSION (PLIF) 1 LEVEL;  Surgeon: Eustace Moore, MD;  Location: [place] NEURO ORS;  Service: Neurosurgery;  Laterality: N/A;  L/4-5 FOR MAXIMUM ACCESS (MAS) POSTERIOR LUMBAR INTERBODY FUSION (PLIF) 1 LEVEL   REFRACTIVE SURGERY Left      Current Outpatient Medications  Medication Sig Dispense Refill   Alcohol Swabs (DROPSAFE ALCOHOL PREP) 70 % PADS USE TWICE DAILY 200 each 2   amLODipine (NORVASC) 10 MG tablet TAKE 1 TABLET (10 MG TOTAL) BY MOUTH DAILY. 90 tablet 1   atorvastatin (LIPITOR) 20 MG tablet TAKE 1 TABLET EVERY DAY 90 tablet 1   Blood Glucose Monitoring Suppl (TRUE METRIX METER) DEVI by Does not apply route.     Continuous Blood Gluc Sensor (DEXCOM G6 SENSOR) MISC 1 Device by Does not apply route See admin instructions. Change every 10 days 9 each 3   diclofenac Sodium (VOLTAREN) 1 % GEL Voltaren 1 % topical gel  APPLY 2 GRAMS TO THE AFFECTED AREA(S) BY TOPICAL ROUTE 4 TIMES PER DAY     docusate sodium (COLACE) 100  MG capsule Take 100 mg by mouth daily as needed for mild constipation. Reported on 03/29/2015     DROPLET INSULIN SYRINGE 30G X 1/2" 1 ML MISC USE TO INJECT INSULIN EVERY DAY WITH BREAKFAST 100 each 2   furosemide (LASIX) 20 MG tablet TAKE 1 TABLET EVERY DAY AS NEEDED (Patient taking differently: 40 mg. TAKE 2 TABLET EVERY DAY AS NEEDED) 90 tablet 0   gabapentin (NEURONTIN) 100 MG capsule Take 1 capsule (100 mg total) by mouth 3 (three) times daily. 90 capsule 3   glucose blood (TRUE METRIX BLOOD GLUCOSE TEST)  test strip 1 each by Other route 4 (four) times daily. 400 each 1   insulin NPH-regular Human (NOVOLIN 70/30) (70-30) 100 UNIT/ML injection Inject 40 Units into the skin daily with breakfast. 50 mL 2   losartan (COZAAR) 50 MG tablet TAKE 1 TABLET EVERY DAY 90 tablet 1   metoprolol succinate (TOPROL-XL) 50 MG 24 hr tablet TAKE 1 TABLET EVERY DAY TAKE WITH OR IMMEDIATELY FOLLOWING A MEAL 90 tablet 3   triamcinolone cream (KENALOG) 0.1 % APPLY TOPICALLY 4 (FOUR) TIMES DAILY AS NEEDED FOR ITCHING 80 g 2   TRUEplus Lancets 33G MISC TEST BLOOD SUGAR TWICE DAILY 200 each 3   Cyanocobalamin (B-12 COMPLIANCE INJECTION IJ) Inject as directed. (Patient not taking: Reported on 02/12/2022)     No current facility-administered medications for this visit.    Allergies:   Patient has no known allergies.    Social History:  The patient  reports that she has never smoked. She has never used smokeless tobacco. She reports that she does not drink alcohol and does not use drugs.   Family History:  The patient's family history includes Arthritis in her mother; Cancer in her brother and mother; Diabetes in her brother and mother; Heart attack in her father; Heart disease in her father; Hypertension in her father.    ROS:  Please see the history of present illness.   Otherwise, review of systems are positive for none.   All other systems are reviewed and negative.    PHYSICAL EXAM: VS:  BP 120/70 (BP Location: Left Arm, Patient Position: Sitting, Cuff Size: Large)   Pulse 81   Ht 5' 7.5" (1.715 m)   Wt 298 lb 4 oz (135.3 kg)   SpO2 98%   BMI 46.02 kg/m  , BMI Body mass index is 46.02 kg/m. GEN: Well nourished, well developed, in no acute distress  HEENT: normal  Neck: no JVD, carotid bruits, or masses Cardiac: RRR; no rubs, or gallops, 2 out of 6 systolic murmur in the aortic area which is early peaking.  There is mild to moderate bilateral leg edema Respiratory:  clear to auscultation bilaterally, normal  work of breathing GI: soft, nontender, nondistended, + BS MS: no deformity or atrophy  Skin: warm and dry, no rash Neuro:  Strength and sensation are intact Psych: euthymic mood, full affect   EKG:  EKG is ordered today. The ekg ordered today demonstrates normal sinus rhythm.  No significant ST or T wave changes.   Recent Labs: 06/12/2021: ALT 12; BUN 18; Creatinine, Ser 1.[name]; Hemoglobin 12.3; Platelets 315; Potassium 4.8; Sodium 144; TSH 3.240    Lipid Panel    Component Value Date/Time   CHOL 139 07/09/2021 1119   CHOL 139 06/12/2021 1036   TRIG 164.0 (H) 07/09/2021 1119   HDL 41.70 07/09/2021 1119   HDL 41 06/12/2021 1036   CHOLHDL 3 07/09/2021 1119   VLDL 32.8  07/09/2021 1119   LDLCALC 65 07/09/2021 1119   LDLCALC 72 06/12/2021 1036      Wt Readings from Last 3 Encounters:  [name]/30/24 298 lb 4 oz (135.3 kg)  [name]/10/24 296 lb (134.3 kg)  [name]/08/24 (!) 300 lb 3.2 oz (136.2 kg)          10/10/2020    3:02 PM  PAD Screen  Previous PAD dx? Yes  Previous surgical procedure? Yes  Dates of procedures Back surgery, kidney stones  Pain with walking? Yes  Subsides with rest? No  Feet/toe relief with dangling? No  Painful, non-healing ulcers? No  Extremities discolored? No      ASSESSMENT AND PLAN:  1.  Chronic diastolic heart failure: Stable exertional dyspnea and her weight has been stable.  Lower extremity edema is likely multifactorial due to some component of heart failure, lymphedema and also high-dose amlodipine.  Continue same dose furosemide 20 mg once daily.    2.  Essential hypertension: Due to significant lower extremity edema, I elected to increase losartan to 100 mg once daily and decrease amlodipine to 2.5 mg once daily.  Will have her follow-up in 6 months to see if we could discontinue amlodipine altogether and probably switch metoprolol to carvedilol.  3.  Hyperlipidemia: Currently on atorvastatin.  Recommended target LDL of less than 70 given that she  is diabetic.  Most recent lipid profile showed an LDL of 65.    Disposition:   FU in 6 months.  Signed,  Kathlyn Sacramento, MD  02/12/2022 4:13 PM    [facility]

## 2022-02-14 DIAGNOSIS — Z9842 Cataract extraction status, left eye: Secondary | ICD-10-CM | POA: Diagnosis not present

## 2022-02-14 DIAGNOSIS — Z0100 Encounter for examination of eyes and vision without abnormal findings: Secondary | ICD-10-CM | POA: Diagnosis not present

## 2022-02-14 DIAGNOSIS — Z9841 Cataract extraction status, right eye: Secondary | ICD-10-CM | POA: Diagnosis not present

## 2022-02-14 DIAGNOSIS — H5203 Hypermetropia, bilateral: Secondary | ICD-10-CM | POA: Diagnosis not present

## 2022-02-14 DIAGNOSIS — E119 Type 2 diabetes mellitus without complications: Secondary | ICD-10-CM | POA: Diagnosis not present

## 2022-02-14 LAB — HM DIABETES EYE EXAM

## 2022-02-20 ENCOUNTER — Encounter: Payer: Self-pay | Admitting: Endocrinology

## 2022-02-25 ENCOUNTER — Telehealth: Payer: Self-pay

## 2022-02-25 NOTE — Telephone Encounter (Signed)
Mychart message sent to pt to schedule follow up.

## 2022-02-25 NOTE — Telephone Encounter (Signed)
Inbound fax requesting 1 year supply on syringes.

## 2022-02-26 NOTE — Telephone Encounter (Signed)
Message left on cell phone voice for patient to call office to schedule follow up appointment.

## 2022-02-27 NOTE — Telephone Encounter (Signed)
Message left on patient's cell phone voice mail to call office to schedule appointment.

## 2022-03-01 NOTE — Telephone Encounter (Signed)
Message left on patient's voice mail to call office to schedule appointment.

## 2022-03-11 DIAGNOSIS — E0859 Diabetes mellitus due to underlying condition with other circulatory complications: Secondary | ICD-10-CM | POA: Diagnosis not present

## 2022-03-11 DIAGNOSIS — I10 Essential (primary) hypertension: Secondary | ICD-10-CM | POA: Diagnosis not present

## 2022-03-11 DIAGNOSIS — E782 Mixed hyperlipidemia: Secondary | ICD-10-CM | POA: Diagnosis not present

## 2022-03-11 DIAGNOSIS — E1165 Type 2 diabetes mellitus with hyperglycemia: Secondary | ICD-10-CM | POA: Diagnosis not present

## 2022-03-11 DIAGNOSIS — Z6841 Body Mass Index (BMI) 40.0 and over, adult: Secondary | ICD-10-CM | POA: Diagnosis not present

## 2022-03-11 DIAGNOSIS — E6609 Other obesity due to excess calories: Secondary | ICD-10-CM | POA: Diagnosis not present

## 2022-03-11 DIAGNOSIS — E119 Type 2 diabetes mellitus without complications: Secondary | ICD-10-CM | POA: Diagnosis not present

## 2022-03-11 DIAGNOSIS — E0840 Diabetes mellitus due to underlying condition with diabetic neuropathy, unspecified: Secondary | ICD-10-CM | POA: Diagnosis not present

## 2022-03-11 DIAGNOSIS — I340 Nonrheumatic mitral (valve) insufficiency: Secondary | ICD-10-CM | POA: Diagnosis not present

## 2022-03-13 DIAGNOSIS — I10 Essential (primary) hypertension: Secondary | ICD-10-CM | POA: Diagnosis not present

## 2022-03-13 DIAGNOSIS — E1165 Type 2 diabetes mellitus with hyperglycemia: Secondary | ICD-10-CM | POA: Diagnosis not present

## 2022-03-14 DIAGNOSIS — I10 Essential (primary) hypertension: Secondary | ICD-10-CM | POA: Diagnosis not present

## 2022-03-14 DIAGNOSIS — E1165 Type 2 diabetes mellitus with hyperglycemia: Secondary | ICD-10-CM | POA: Diagnosis not present

## 2022-03-20 ENCOUNTER — Other Ambulatory Visit: Payer: Self-pay | Admitting: Physician Assistant

## 2022-03-20 DIAGNOSIS — E663 Overweight: Secondary | ICD-10-CM | POA: Diagnosis not present

## 2022-03-20 DIAGNOSIS — I10 Essential (primary) hypertension: Secondary | ICD-10-CM | POA: Diagnosis not present

## 2022-03-20 DIAGNOSIS — I5042 Chronic combined systolic (congestive) and diastolic (congestive) heart failure: Secondary | ICD-10-CM | POA: Diagnosis not present

## 2022-03-20 DIAGNOSIS — R609 Edema, unspecified: Secondary | ICD-10-CM | POA: Diagnosis not present

## 2022-03-20 DIAGNOSIS — E7800 Pure hypercholesterolemia, unspecified: Secondary | ICD-10-CM

## 2022-03-20 DIAGNOSIS — E785 Hyperlipidemia, unspecified: Secondary | ICD-10-CM | POA: Diagnosis not present

## 2022-03-20 DIAGNOSIS — N1831 Chronic kidney disease, stage 3a: Secondary | ICD-10-CM | POA: Diagnosis not present

## 2022-03-20 DIAGNOSIS — E1122 Type 2 diabetes mellitus with diabetic chronic kidney disease: Secondary | ICD-10-CM | POA: Diagnosis not present

## 2022-03-20 DIAGNOSIS — I509 Heart failure, unspecified: Secondary | ICD-10-CM | POA: Diagnosis not present

## 2022-03-23 ENCOUNTER — Other Ambulatory Visit: Payer: Self-pay | Admitting: Physician Assistant

## 2022-03-23 DIAGNOSIS — I10 Essential (primary) hypertension: Secondary | ICD-10-CM

## 2022-03-28 DIAGNOSIS — I10 Essential (primary) hypertension: Secondary | ICD-10-CM | POA: Diagnosis not present

## 2022-03-28 DIAGNOSIS — E782 Mixed hyperlipidemia: Secondary | ICD-10-CM | POA: Diagnosis not present

## 2022-03-28 DIAGNOSIS — E1165 Type 2 diabetes mellitus with hyperglycemia: Secondary | ICD-10-CM | POA: Diagnosis not present

## 2022-03-28 DIAGNOSIS — E133299 Other specified diabetes mellitus with mild nonproliferative diabetic retinopathy without macular edema, unspecified eye: Secondary | ICD-10-CM | POA: Diagnosis not present

## 2022-03-28 DIAGNOSIS — E1122 Type 2 diabetes mellitus with diabetic chronic kidney disease: Secondary | ICD-10-CM | POA: Diagnosis not present

## 2022-03-28 DIAGNOSIS — I340 Nonrheumatic mitral (valve) insufficiency: Secondary | ICD-10-CM | POA: Diagnosis not present

## 2022-03-28 DIAGNOSIS — Z6841 Body Mass Index (BMI) 40.0 and over, adult: Secondary | ICD-10-CM | POA: Diagnosis not present

## 2022-04-08 ENCOUNTER — Encounter (INDEPENDENT_AMBULATORY_CARE_PROVIDER_SITE_OTHER): Payer: Self-pay | Admitting: Vascular Surgery

## 2022-04-08 ENCOUNTER — Ambulatory Visit (INDEPENDENT_AMBULATORY_CARE_PROVIDER_SITE_OTHER): Payer: Medicare HMO | Admitting: Vascular Surgery

## 2022-04-08 VITALS — BP 171/83 | HR 75 | Resp 18 | Ht 67.5 in | Wt 283.8 lb

## 2022-04-08 DIAGNOSIS — I10 Essential (primary) hypertension: Secondary | ICD-10-CM | POA: Diagnosis not present

## 2022-04-08 DIAGNOSIS — N183 Chronic kidney disease, stage 3 unspecified: Secondary | ICD-10-CM

## 2022-04-08 DIAGNOSIS — E1122 Type 2 diabetes mellitus with diabetic chronic kidney disease: Secondary | ICD-10-CM

## 2022-04-08 DIAGNOSIS — E7800 Pure hypercholesterolemia, unspecified: Secondary | ICD-10-CM | POA: Diagnosis not present

## 2022-04-08 DIAGNOSIS — I890 Lymphedema, not elsewhere classified: Secondary | ICD-10-CM | POA: Diagnosis not present

## 2022-04-08 DIAGNOSIS — Z794 Long term (current) use of insulin: Secondary | ICD-10-CM

## 2022-04-08 DIAGNOSIS — I872 Venous insufficiency (chronic) (peripheral): Secondary | ICD-10-CM

## 2022-04-08 NOTE — Progress Notes (Signed)
MRN : [id number]  Bethany [name] is a 77 y.o. (1945-03-09) female who presents with chief complaint of legs swell.  History of Present Illness:   The patient returns to the office for followup evaluation regarding leg swelling.  The swelling has persisted and the pain associated with swelling continues. There have not been any interval development of a ulcerations or wounds.  Since the previous visit the patient has been wearing graduated compression stockings and has noted little if any improvement in the lymphedema. The patient has been using compression routinely morning until night.  The patient also states elevation during the day and exercise is being done too.  Current Meds  Medication Sig   Alcohol Swabs (DROPSAFE ALCOHOL PREP) 70 % PADS USE TWICE DAILY   amLODipine (NORVASC) 2.5 MG tablet Take 1 tablet (2.5 mg total) by mouth daily.   atorvastatin (LIPITOR) 20 MG tablet TAKE 1 TABLET EVERY DAY   Blood Glucose Monitoring Suppl (TRUE METRIX METER) DEVI by Does not apply route.   Continuous Blood Gluc Sensor (DEXCOM G6 SENSOR) MISC 1 Device by Does not apply route See admin instructions. Change every 10 days   dexamethasone (DECADRON) 1 MG tablet Take 1 mg by mouth daily.   diclofenac Sodium (VOLTAREN) 1 % GEL Voltaren 1 % topical gel  APPLY 2 GRAMS TO THE AFFECTED AREA(S) BY TOPICAL ROUTE 4 TIMES PER DAY   docusate sodium (COLACE) 100 MG capsule Take 100 mg by mouth daily as needed for mild constipation. Reported on 03/29/2015   DROPLET INSULIN SYRINGE 30G X 1/2" 1 ML MISC USE TO INJECT INSULIN EVERY DAY WITH BREAKFAST   furosemide (LASIX) 20 MG tablet TAKE 1 TABLET EVERY DAY AS NEEDED (Patient taking differently: 40 mg. TAKE 2 TABLET EVERY DAY AS NEEDED)   gabapentin (NEURONTIN) 100 MG capsule Take 1 capsule (100 mg total) by mouth 3 (three) times daily.   glucose blood (TRUE METRIX BLOOD GLUCOSE TEST) test strip 1 each by Other route 4 (four) times daily.   insulin  NPH-regular Human (NOVOLIN 70/30) (70-30) 100 UNIT/ML injection Inject 40 Units into the skin daily with breakfast.   losartan (COZAAR) 100 MG tablet Take 1 tablet (100 mg total) by mouth daily.   metoprolol succinate (TOPROL-XL) 50 MG 24 hr tablet TAKE 1 TABLET EVERY DAY TAKE WITH OR IMMEDIATELY FOLLOWING A MEAL   NOVOLOG FLEXPEN 100 UNIT/ML FlexPen Inject into the skin.   TOUJEO SOLOSTAR 300 UNIT/ML Solostar Pen Inject into the skin.   triamcinolone cream (KENALOG) 0.1 % APPLY TOPICALLY 4 (FOUR) TIMES DAILY AS NEEDED FOR ITCHING   TRUEplus Lancets 33G MISC TEST BLOOD SUGAR TWICE DAILY    Past Medical History:  Diagnosis Date   Arthritis    Chronic kidney disease    Diabetes mellitus without complication (HCC)    Type II   Fibromyalgia    GERD (gastroesophageal reflux disease)    better after gall bladder surgery-    Hyperlipidemia    Hypertension    Shortness of breath dyspnea    with exertion    Past Surgical History:  Procedure Laterality Date   ABDOMINAL HYSTERECTOMY  01/15/1995   CATARACT EXTRACTION Bilateral 01/15/2012   CHOLECYSTECTOMY  01/15/2008   COLONOSCOPY     COLONOSCOPY WITH PROPOFOL N/A 04/21/2015   Procedure: COLONOSCOPY WITH PROPOFOL;  Surgeon: Hulen Luster, MD;  Location: [facility] ENDOSCOPY;  Service: Gastroenterology;  Laterality: N/A;   EYE SURGERY Bilateral    Cataract  LUMBAR WOUND DEBRIDEMENT N/A 10/20/2014   Procedure: LUMBAR WOUND DEBRIDEMENT;  Surgeon: Eustace Moore, MD;  Location: [place] NEURO ORS;  Service: Neurosurgery;  Laterality: N/A;   MAXIMUM ACCESS (MAS)POSTERIOR LUMBAR INTERBODY FUSION (PLIF) 1 LEVEL N/A 09/16/2014   Procedure: L/4-5 FOR MAXIMUM ACCESS (MAS) POSTERIOR LUMBAR INTERBODY FUSION (PLIF) 1 LEVEL;  Surgeon: Eustace Moore, MD;  Location: [place] NEURO ORS;  Service: Neurosurgery;  Laterality: N/A;  L/4-5 FOR MAXIMUM ACCESS (MAS) POSTERIOR LUMBAR INTERBODY FUSION (PLIF) 1 LEVEL   REFRACTIVE SURGERY Left     Social History Social History    Tobacco Use   Smoking status: Never   Smokeless tobacco: Never  Vaping Use   Vaping Use: Never used  Substance Use Topics   Alcohol use: No   Drug use: No    Family History Family History  Problem Relation Age of Onset   Arthritis Mother    Cancer Mother        Ovarian   Diabetes Mother    Heart attack Father    Heart disease Father    Hypertension Father    Cancer Brother    Diabetes Brother     No Known Allergies   REVIEW OF SYSTEMS (Negative unless checked)  Constitutional: [] Weight loss  [] Fever  [] Chills Cardiac: [] Chest pain   [] Chest pressure   [] Palpitations   [] Shortness of breath when laying flat   [] Shortness of breath with exertion. Vascular:  [] Pain in legs with walking   [x] Pain in legs with standing  [] History of DVT   [] Phlebitis   [x] Swelling in legs   [] Varicose veins   [] Non-healing ulcers Pulmonary:   [] Uses home oxygen   [] Productive cough   [] Hemoptysis   [] Wheeze  [] COPD   [] Asthma Neurologic:  [] Dizziness   [] Seizures   [] History of stroke   [] History of TIA  [] Aphasia   [] Vissual changes   [] Weakness or numbness in arm   [] Weakness or numbness in leg Musculoskeletal:   [] Joint swelling   [] Joint pain   [] Low back pain Hematologic:  [] Easy bruising  [] Easy bleeding   [] Hypercoagulable state   [] Anemic Gastrointestinal:  [] Diarrhea   [] Vomiting  [x] Gastroesophageal reflux/heartburn   [] Difficulty swallowing. Genitourinary:  [] Chronic kidney disease   [] Difficult urination  [] Frequent urination   [] Blood in urine Skin:  [] Rashes   [] Ulcers  Psychological:  [] History of anxiety   []  History of major depression.  Physical Examination  Vitals:   04/08/22 1040  BP: (!) 171/83  Pulse: 75  Resp: 18  Weight: 283 lb 12.8 oz (128.7 kg)  Height: 5' 7.5" (1.715 m)   Body mass index is 43.79 kg/m. Gen: WD/WN, NAD Head: [place]/AT, No temporalis wasting.  Ear/Nose/Throat: Hearing grossly intact, nares w/o erythema or drainage, pinna without  lesions Eyes: PER, EOMI, sclera nonicteric.  Neck: Supple, no gross masses.  No JVD.  Pulmonary:  Good air movement, no audible wheezing, no use of accessory muscles.  Cardiac: RRR, precordium not hyperdynamic. Vascular:  scattered varicosities present bilaterally.  Mild venous stasis changes to the legs bilaterally.  3-4+ soft pitting edema, CEAP C4sEpAsPr  Vessel Right Left  Radial Palpable Palpable  Gastrointestinal: soft, non-distended. No guarding/no peritoneal signs.  Musculoskeletal: M/S 5/5 throughout.  No deformity.  Neurologic: CN 2-12 intact. Pain and light touch intact in extremities.  Symmetrical.  Speech is fluent. Motor exam as listed above. Psychiatric: Judgment intact, Mood & affect appropriate for pt's clinical situation. Dermatologic: Venous rashes no ulcers noted.  No changes  consistent with cellulitis. Lymph : No lichenification or skin changes of chronic lymphedema.  CBC Lab Results  Component Value Date   WBC 8.0 06/12/2021   HGB 12.3 06/12/2021   HCT 39.1 06/12/2021   MCV 84 06/12/2021   PLT 315 06/12/2021    BMET    Component Value Date/Time   NA 144 06/12/2021 1036   K 4.8 06/12/2021 1036   CL 103 06/12/2021 1036   CO2 23 06/12/2021 1036   GLUCOSE 198 (H) 07/09/2021 1119   BUN 18 06/12/2021 1036   CREATININE 1.01 (H) 06/12/2021 1036   CALCIUM 9.8 06/12/2021 1036   GFRNONAA 55 (L) 10/20/2014 1039   GFRAA >60 10/20/2014 1039   CrCl cannot be calculated (Patient's most recent lab result is older than the maximum 21 days allowed.).  COAG Lab Results  Component Value Date   INR 1.14 09/06/2014    Radiology No results found.   Assessment/Plan 1. Lymphedema Recommend:  No surgery or intervention at this point in time.   The Patient is CEAP C4sEpAsPr.  The patient has been wearing compression for more than 12 weeks with no or little benefit.  The patient has been exercising daily for more than 12 weeks. The patient has been elevating and  taking OTC pain medications for more than 12 weeks.  None of these have have eliminated the pain related to the lymphedema or the discomfort regarding excessive swelling and venous congestion.    I have reviewed my discussion with the patient regarding lymphedema and why it  causes symptoms.  Patient will continue wearing graduated compression on a daily basis. The patient should put the compression on first thing in the morning and removing them in the evening. The patient should not sleep in the compression.   In addition, behavioral modification throughout the day will be continued.  This will include frequent elevation (such as in a recliner), use of over the counter pain medications as needed and exercise such as walking.  The systemic causes for chronic edema such as liver, kidney and cardiac etiologies do not appear to have significant changed over the past year.    The patient has chronic , severe lymphedema with hyperpigmentation of the skin and has done MLD, skin care, medication, diet, exercise, elevation and compression for 4 weeks with no improvement,  I am recommending a lymphedema pump.  The patient still has stage 3 lymphedema and therefore, I believe that a lymph pump is needed to improve the control of the patient's lymphedema and improve the quality of life.  Additionally, a lymph pump is warranted because it will reduce the risk of cellulitis and ulceration in the future.  Patient should follow-up in six months   2. Chronic venous insufficiency Recommend:  No surgery or intervention at this point in time.   The Patient is CEAP C4sEpAsPr.  The patient has been wearing compression for more than 12 weeks with no or little benefit.  The patient has been exercising daily for more than 12 weeks. The patient has been elevating and taking OTC pain medications for more than 12 weeks.  None of these have have eliminated the pain related to the lymphedema or the discomfort regarding  excessive swelling and venous congestion.    I have reviewed my discussion with the patient regarding lymphedema and why it  causes symptoms.  Patient will continue wearing graduated compression on a daily basis. The patient should put the compression on first thing in the morning and removing  them in the evening. The patient should not sleep in the compression.   In addition, behavioral modification throughout the day will be continued.  This will include frequent elevation (such as in a recliner), use of over the counter pain medications as needed and exercise such as walking.  The systemic causes for chronic edema such as liver, kidney and cardiac etiologies do not appear to have significant changed over the past year.    The patient has chronic , severe lymphedema with hyperpigmentation of the skin and has done MLD, skin care, medication, diet, exercise, elevation and compression for 4 weeks with no improvement,  I am recommending a lymphedema pump.  The patient still has stage 3 lymphedema and therefore, I believe that a lymph pump is needed to improve the control of the patient's lymphedema and improve the quality of life.  Additionally, a lymph pump is warranted because it will reduce the risk of cellulitis and ulceration in the future.  Patient should follow-up in six months   3. Essential hypertension Continue antihypertensive medications as already ordered, these medications have been reviewed and there are no changes at this time.  4. Type 2 diabetes mellitus with stage 3 chronic kidney disease, with long-term current use of insulin, unspecified whether stage 3a or 3b CKD ([place]) Continue hypoglycemic medications as already ordered, these medications have been reviewed and there are no changes at this time.  Hgb A1C to be monitored as already arranged by primary service  5. Pure hypercholesterolemia Continue statin as ordered and reviewed, no changes at this time    Hortencia Pilar,  MD  04/08/2022 3:31 PM

## 2022-04-15 ENCOUNTER — Telehealth (INDEPENDENT_AMBULATORY_CARE_PROVIDER_SITE_OTHER): Payer: Self-pay

## 2022-04-15 NOTE — Telephone Encounter (Signed)
This was received on 04/11/22 from Kern Alberta at [place]:   [name], This patient has [facility]. [facility] HMO plans are no longer allowing any vendors like [employer]. to process lymphedema pumps. [employer] H[employer] will be sending all DME orders to a company that doesn't provide pumps (yes, that is true). Currently, there is nothing anyone with this [facility] plan can do to obtain a pump. We will make the patient aware.    Bethany [name]

## 2022-04-18 DIAGNOSIS — Z6841 Body Mass Index (BMI) 40.0 and over, adult: Secondary | ICD-10-CM | POA: Diagnosis not present

## 2022-04-18 DIAGNOSIS — I10 Essential (primary) hypertension: Secondary | ICD-10-CM | POA: Diagnosis not present

## 2022-04-18 DIAGNOSIS — E782 Mixed hyperlipidemia: Secondary | ICD-10-CM | POA: Diagnosis not present

## 2022-04-18 DIAGNOSIS — I340 Nonrheumatic mitral (valve) insufficiency: Secondary | ICD-10-CM | POA: Diagnosis not present

## 2022-04-18 DIAGNOSIS — E1165 Type 2 diabetes mellitus with hyperglycemia: Secondary | ICD-10-CM | POA: Diagnosis not present

## 2022-04-19 ENCOUNTER — Encounter: Payer: Medicare HMO | Attending: Endocrinology | Admitting: Skilled Nursing Facility1

## 2022-04-19 ENCOUNTER — Encounter: Payer: Self-pay | Admitting: Skilled Nursing Facility1

## 2022-04-19 DIAGNOSIS — N1831 Chronic kidney disease, stage 3a: Secondary | ICD-10-CM

## 2022-04-19 DIAGNOSIS — E119 Type 2 diabetes mellitus without complications: Secondary | ICD-10-CM | POA: Insufficient documentation

## 2022-04-19 DIAGNOSIS — Z713 Dietary counseling and surveillance: Secondary | ICD-10-CM | POA: Diagnosis not present

## 2022-04-19 DIAGNOSIS — E1122 Type 2 diabetes mellitus with diabetic chronic kidney disease: Secondary | ICD-10-CM

## 2022-04-19 DIAGNOSIS — E669 Obesity, unspecified: Secondary | ICD-10-CM | POA: Insufficient documentation

## 2022-04-19 NOTE — Progress Notes (Signed)
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  Pt states she has back pain leading her to being less mobile stating she just sits ad eats as well as bone on bone in her knees.   Pt states she is currently doing herbal cleansing: greens and a lot of fruit, no meats and no cheese.  Pt states she likes to go to casinos but cannot get out like she wants to.  Pt states she is not doing anything behaviorally to increase her A1C so there is nothing the dietitian can help her with.  Pt states she lives with her daughter who cooks for her.  Pt states she does not take her meal time insulin if she does not eat.  Pt states she does get some sugars in the 60's.  Pt states she snacks on fruit all day. Pt states she does not eat any bread. Pt states she used to drink pepsi but for the last 3 weeks she has not drank any in preparation for a cruise so she can lose enough weight.    DM: Dexcom G6 Novolog 5 units with each meal Toujeo 20 units  Allergic to ozempic  A1C 9.1  Other Dx: GERD Arthritis Hyperlipidemia HTN Kidney disease   Education Topics: The patient appears resistant to further education at present, asserting that she has accumulated knowledge over 30 years and deems herself sufficiently informed. She indicates her presence is solely upon her doctor's recommendation. Despite the dietitian's efforts to offer guidance regarding a probable excess intake of fruit and inadequate protein consumption, along with suggestions for plant-based protein alternatives, the patient insists she is already knowledgeable on these matters.

## 2022-05-15 DIAGNOSIS — E782 Mixed hyperlipidemia: Secondary | ICD-10-CM | POA: Diagnosis not present

## 2022-05-15 DIAGNOSIS — I10 Essential (primary) hypertension: Secondary | ICD-10-CM | POA: Diagnosis not present

## 2022-05-15 DIAGNOSIS — E1165 Type 2 diabetes mellitus with hyperglycemia: Secondary | ICD-10-CM | POA: Diagnosis not present

## 2022-05-15 DIAGNOSIS — I119 Hypertensive heart disease without heart failure: Secondary | ICD-10-CM | POA: Diagnosis not present

## 2022-05-22 DIAGNOSIS — E782 Mixed hyperlipidemia: Secondary | ICD-10-CM | POA: Diagnosis not present

## 2022-05-22 DIAGNOSIS — I10 Essential (primary) hypertension: Secondary | ICD-10-CM | POA: Diagnosis not present

## 2022-05-22 DIAGNOSIS — E1122 Type 2 diabetes mellitus with diabetic chronic kidney disease: Secondary | ICD-10-CM | POA: Diagnosis not present

## 2022-05-22 DIAGNOSIS — E1165 Type 2 diabetes mellitus with hyperglycemia: Secondary | ICD-10-CM | POA: Diagnosis not present

## 2022-05-22 DIAGNOSIS — E133299 Other specified diabetes mellitus with mild nonproliferative diabetic retinopathy without macular edema, unspecified eye: Secondary | ICD-10-CM | POA: Diagnosis not present

## 2022-05-22 DIAGNOSIS — I340 Nonrheumatic mitral (valve) insufficiency: Secondary | ICD-10-CM | POA: Diagnosis not present

## 2022-05-22 DIAGNOSIS — Z6841 Body Mass Index (BMI) 40.0 and over, adult: Secondary | ICD-10-CM | POA: Diagnosis not present

## 2022-06-24 ENCOUNTER — Encounter: Payer: Self-pay | Admitting: Physician Assistant

## 2022-06-24 ENCOUNTER — Ambulatory Visit (INDEPENDENT_AMBULATORY_CARE_PROVIDER_SITE_OTHER): Payer: Medicare HMO | Admitting: Physician Assistant

## 2022-06-24 VITALS — BP 161/73 | HR 66 | Temp 98.0°F | Resp 16 | Ht 67.5 in | Wt 296.4 lb

## 2022-06-24 DIAGNOSIS — I890 Lymphedema, not elsewhere classified: Secondary | ICD-10-CM

## 2022-06-24 DIAGNOSIS — Z0001 Encounter for general adult medical examination with abnormal findings: Secondary | ICD-10-CM | POA: Diagnosis not present

## 2022-06-24 DIAGNOSIS — N1831 Chronic kidney disease, stage 3a: Secondary | ICD-10-CM

## 2022-06-24 DIAGNOSIS — E1151 Type 2 diabetes mellitus with diabetic peripheral angiopathy without gangrene: Secondary | ICD-10-CM

## 2022-06-24 DIAGNOSIS — Z1231 Encounter for screening mammogram for malignant neoplasm of breast: Secondary | ICD-10-CM

## 2022-06-24 DIAGNOSIS — I10 Essential (primary) hypertension: Secondary | ICD-10-CM

## 2022-06-24 NOTE — Progress Notes (Signed)
[facility] [address] [place], [place] [address]  Internal MEDICINE  Office Visit Note  Patient Name: Bethany [name]  [id number]  [id number]  Date of Service: 07/03/2022  Chief Complaint  Patient presents with   Diabetes   Gastroesophageal Reflux   Hypertension   Hyperlipidemia   Medicare Wellness     HPI Pt is here for routine health maintenance examination -Continues to have arthritis pain -Her endocrinologist is retiring and needs diabetic management, she would like [place] to take over this. Takes novalog 5units with meals, then takes 20-25 units of long acting at bedtime based on sliding scale. Having some high sugars. This was a change from the 70/30 mix. Does have continuous monitor -Patient given meal time sliding scale guidelines to help adjust insulin for better sugar control -is taking a herbal supplement, lost some weight since taking this -Had side effects with metformin previously and with ozempic -foot exam: reduced sensation, does not like gabapentin  and stopped. She is seeing vascular and cardiology -Has some heartburn, may try omeprazole -BP at home generally well controlled, has not taken meds yet this morning -Continues to have low back pain, has not seen ortho in awhile, had a shot previously  Current Medication: Outpatient Encounter Medications as of 06/24/2022  Medication Sig   Alcohol Swabs (DROPSAFE ALCOHOL PREP) 70 % PADS USE TWICE DAILY   amLODipine (NORVASC) 2.5 MG tablet Take 1 tablet (2.5 mg total) by mouth daily.   atorvastatin (LIPITOR) 20 MG tablet TAKE 1 TABLET EVERY DAY   Blood Glucose Monitoring Suppl (TRUE METRIX METER) DEVI by Does not apply route.   Continuous Blood Gluc Sensor (DEXCOM G6 SENSOR) MISC 1 Device by Does not apply route See admin instructions. Change every 10 days   Cyanocobalamin (B-12 COMPLIANCE INJECTION IJ) Inject as directed.   dexamethasone (DECADRON) 1 MG tablet Take 1 mg by mouth daily.   diclofenac  Sodium (VOLTAREN) 1 % GEL Voltaren 1 % topical gel  APPLY 2 GRAMS TO THE AFFECTED AREA(S) BY TOPICAL ROUTE 4 TIMES PER DAY   docusate sodium (COLACE) 100 MG capsule Take 100 mg by mouth daily as needed for mild constipation. Reported on 03/29/2015   DROPLET INSULIN SYRINGE 30G X 1/2" 1 ML MISC USE TO INJECT INSULIN EVERY DAY WITH BREAKFAST   furosemide (LASIX) 20 MG tablet TAKE 1 TABLET EVERY DAY AS NEEDED (Patient taking differently: 40 mg. TAKE 2 TABLET EVERY DAY AS NEEDED)   gabapentin (NEURONTIN) 100 MG capsule Take 1 capsule (100 mg total) by mouth 3 (three) times daily.   glucose blood (TRUE METRIX BLOOD GLUCOSE TEST) test strip 1 each by Other route 4 (four) times daily.   insulin NPH-regular Human (NOVOLIN 70/30) (70-30) 100 UNIT/ML injection Inject 40 Units into the skin daily with breakfast.   losartan (COZAAR) 100 MG tablet Take 1 tablet (100 mg total) by mouth daily.   metoprolol succinate (TOPROL-XL) 50 MG 24 hr tablet TAKE 1 TABLET EVERY DAY TAKE WITH OR IMMEDIATELY FOLLOWING A MEAL   NOVOLOG FLEXPEN 100 UNIT/ML FlexPen Inject into the skin.   TOUJEO SOLOSTAR 300 UNIT/ML Solostar Pen Inject into the skin.   triamcinolone cream (KENALOG) 0.1 % APPLY TOPICALLY 4 (FOUR) TIMES DAILY AS NEEDED FOR ITCHING   TRUEplus Lancets 33G MISC TEST BLOOD SUGAR TWICE DAILY   No facility-administered encounter medications on file as of 06/24/2022.    Surgical History: Past Surgical History:  Procedure Laterality Date   ABDOMINAL HYSTERECTOMY  01/15/1995  CATARACT EXTRACTION Bilateral 01/15/2012   CHOLECYSTECTOMY  01/15/2008   COLONOSCOPY     COLONOSCOPY WITH PROPOFOL N/A 04/21/2015   Procedure: COLONOSCOPY WITH PROPOFOL;  Surgeon: Wallace Cullens, MD;  Location: [facility] ENDOSCOPY;  Service: Gastroenterology;  Laterality: N/A;   EYE SURGERY Bilateral    Cataract   LUMBAR WOUND DEBRIDEMENT N/A 10/20/2014   Procedure: LUMBAR WOUND DEBRIDEMENT;  Surgeon: Tia Alert, MD;  Location: [facility] NEURO ORS;   Service: Neurosurgery;  Laterality: N/A;   MAXIMUM ACCESS (MAS)POSTERIOR LUMBAR INTERBODY FUSION (PLIF) 1 LEVEL N/A 09/16/2014   Procedure: L/4-5 FOR MAXIMUM ACCESS (MAS) POSTERIOR LUMBAR INTERBODY FUSION (PLIF) 1 LEVEL;  Surgeon: Tia Alert, MD;  Location: [facility] NEURO ORS;  Service: Neurosurgery;  Laterality: N/A;  L/4-5 FOR MAXIMUM ACCESS (MAS) POSTERIOR LUMBAR INTERBODY FUSION (PLIF) 1 LEVEL   REFRACTIVE SURGERY Left     Medical History: Past Medical History:  Diagnosis Date   Arthritis    Chronic kidney disease    Diabetes mellitus without complication (HCC)    Type II   Fibromyalgia    GERD (gastroesophageal reflux disease)    better after gall bladder surgery-    Hyperlipidemia    Hypertension    Shortness of breath dyspnea    with exertion    Family History: Family History  Problem Relation Age of Onset   Arthritis Mother    Cancer Mother        Ovarian   Diabetes Mother    Heart attack Father    Heart disease Father    Hypertension Father    Cancer Brother    Diabetes Brother       Review of Systems  Constitutional:  Negative for chills, fatigue and unexpected weight change.  HENT:  Positive for postnasal drip. Negative for congestion, rhinorrhea, sneezing and sore throat.   Eyes:  Negative for redness.  Respiratory:  Negative for cough, chest tightness and shortness of breath.   Cardiovascular:  Negative for chest pain and palpitations.  Gastrointestinal:  Negative for abdominal pain, constipation, diarrhea, nausea and vomiting.  Genitourinary:  Negative for dysuria and frequency.  Musculoskeletal:  Positive for arthralgias, back pain and gait problem. Negative for joint swelling and neck pain.  Skin:  Negative for rash.  Neurological:  Negative for tremors.  Hematological:  Negative for adenopathy. Does not bruise/bleed easily.  Psychiatric/Behavioral:  Negative for behavioral problems (Depression), sleep disturbance and suicidal ideas. The patient is not  nervous/anxious.      Vital Signs: BP (!) 161/73   Pulse 66   Temp 98 F (36.7 C)   Resp 16   Ht 5' 7.5" (1.715 m)   Wt 296 lb 6.4 oz (134.4 kg)   SpO2 97%   BMI 45.74 kg/m    Physical Exam Vitals and nursing note reviewed.  Constitutional:      General: She is not in acute distress.    Appearance: She is well-developed. She is obese. She is not diaphoretic.  HENT:     Head: Normocephalic and atraumatic.     Mouth/Throat:     Pharynx: No oropharyngeal exudate.  Eyes:     Pupils: Pupils are equal, round, and reactive to light.  Neck:     Thyroid: No thyromegaly.     Vascular: No JVD.     Trachea: No tracheal deviation.  Cardiovascular:     Rate and Rhythm: Normal rate and regular rhythm.     Pulses:  Dorsalis pedis pulses are 1+ on the right side and 1+ on the left side.       Posterior tibial pulses are 1+ on the right side and 1+ on the left side.     Heart sounds: Normal heart sounds. No murmur heard.    No friction rub. No gallop.  Pulmonary:     Effort: Pulmonary effort is normal. No respiratory distress.     Breath sounds: No wheezing or rales.  Chest:     Chest wall: No tenderness.  Abdominal:     General: Bowel sounds are normal.     Palpations: Abdomen is soft.     Tenderness: There is no abdominal tenderness.  Musculoskeletal:        General: Normal range of motion.     Cervical back: Normal range of motion and neck supple.     Right foot: Normal range of motion.     Left foot: Normal range of motion.  Feet:     Right foot:     Protective Sensation: 2 sites tested.   1 site sensed.    Skin integrity: Skin integrity normal.     Toenail Condition: Right toenails are normal.     Left foot:     Protective Sensation: 2 sites tested.   1 site sensed.    Skin integrity: Skin integrity normal.     Toenail Condition: Left toenails are normal.  Lymphadenopathy:     Cervical: No cervical adenopathy.  Skin:    General: Skin is warm and dry.   Neurological:     Mental Status: She is alert and oriented to person, place, and time.     Cranial Nerves: No cranial nerve deficit.  Psychiatric:        Behavior: Behavior normal.        Thought Content: Thought content normal.        Judgment: Judgment normal.      LABS: No results found for this or any previous visit (from the past 2160 hour(s)).      Assessment/Plan: 1. Encounter for general adult medical examination with abnormal findings CPE performed, mammogram ordered  2. Diabetes mellitus with peripheral vascular disease (HCC) Endocrinologist retiring and therefore will follow and adjust medications. Given sliding scale guide for mealtimes and continue to work on diet and exercise  3. Benign hypertension Elevated in office, but has not taken medication yet and will monitor at home  4. Lymphedema of both lower extremities Followed by vascular  5. Visit for screening mammogram - MM 3D SCREENING MAMMOGRAM BILATERAL BREAST; Future  6. Stage 3a chronic kidney disease (HCC) Followed by nephrology   General Counseling: Yuko verbalizes understanding of the findings of todays visit and agrees with plan of treatment. I have discussed any further diagnostic evaluation that may be needed or ordered today. We also reviewed her medications today. she has been encouraged to call the office with any questions or concerns that should arise related to todays visit.    Counseling:    Orders Placed This Encounter  Procedures   MM 3D SCREENING MAMMOGRAM BILATERAL BREAST    No orders of the defined types were placed in this encounter.   This patient was seen by Lynn Ito, PA-C in collaboration with Dr. Beverely Risen as a part of collaborative care agreement.  Total time spent:35 Minutes  Time spent includes review of chart, medications, test results, and follow up plan with the patient.     Lyndon Code, MD  Internal Medicine

## 2022-07-15 ENCOUNTER — Ambulatory Visit
Admission: RE | Admit: 2022-07-15 | Discharge: 2022-07-15 | Disposition: A | Discharge status: Home / Self Care | Payer: Medicare HMO | Source: Ambulatory Visit | Attending: Physician Assistant | Admitting: Physician Assistant

## 2022-07-15 DIAGNOSIS — Z1231 Encounter for screening mammogram for malignant neoplasm of breast: Secondary | ICD-10-CM | POA: Diagnosis not present

## 2022-07-17 ENCOUNTER — Other Ambulatory Visit: Payer: Self-pay | Admitting: Endocrinology

## 2022-07-17 DIAGNOSIS — E1165 Type 2 diabetes mellitus with hyperglycemia: Secondary | ICD-10-CM

## 2022-07-23 ENCOUNTER — Ambulatory Visit (INDEPENDENT_AMBULATORY_CARE_PROVIDER_SITE_OTHER): Payer: Medicare HMO | Admitting: Vascular Surgery

## 2022-08-07 ENCOUNTER — Other Ambulatory Visit: Payer: Self-pay

## 2022-08-07 ENCOUNTER — Other Ambulatory Visit: Payer: Self-pay | Admitting: Physician Assistant

## 2022-08-07 ENCOUNTER — Telehealth: Payer: Self-pay

## 2022-08-07 MED ORDER — METOPROLOL SUCCINATE ER 50 MG PO TB24: 50.0000 mg | ORAL_TABLET | Freq: Every day | ORAL | 3 refills | Status: DC

## 2022-08-07 MED ORDER — METOPROLOL SUCCINATE ER 50 MG PO TB24: 50.0000 mg | ORAL_TABLET | Freq: Every day | ORAL | 0 refills | Status: AC

## 2022-08-07 MED ORDER — METOPROLOL SUCCINATE ER 50 MG PO TB24: 50.0000 mg | ORAL_TABLET | Freq: Every day | ORAL | 3 refills | Status: AC

## 2022-08-07 NOTE — Telephone Encounter (Signed)
Pt.notified

## 2022-08-12 ENCOUNTER — Telehealth (INDEPENDENT_AMBULATORY_CARE_PROVIDER_SITE_OTHER): Payer: Self-pay | Admitting: Nurse Practitioner

## 2022-08-12 ENCOUNTER — Other Ambulatory Visit: Payer: Self-pay

## 2022-08-12 DIAGNOSIS — I10 Essential (primary) hypertension: Secondary | ICD-10-CM

## 2022-08-12 MED ORDER — AMLODIPINE BESYLATE 2.5 MG PO TABS
2.5000 mg | ORAL_TABLET | Freq: Every day | ORAL | 1 refills | Status: DC
Start: 2022-08-12 — End: 2023-01-06

## 2022-08-12 NOTE — Telephone Encounter (Signed)
After 4 weeks of daily exercise, elevation, and compression patient lymphedema and hyperpigmentation has not improved. Recommendation of pump. Measurements: Left ankle  29.6cm (6/26);  29.9cm(7/24) Right ankle  31.0cm(6/26)  31.5cm(7/24) Left calf      43.2cm(6/26);  43.4cm(7/24) Right calf         47.5cm(6/26);  47.6cm(7/24)

## 2022-08-13 ENCOUNTER — Ambulatory Visit (INDEPENDENT_AMBULATORY_CARE_PROVIDER_SITE_OTHER): Payer: Medicare HMO | Admitting: Vascular Surgery

## 2022-08-13 ENCOUNTER — Encounter (INDEPENDENT_AMBULATORY_CARE_PROVIDER_SITE_OTHER): Payer: Self-pay | Admitting: Vascular Surgery

## 2022-08-13 VITALS — BP 166/74 | HR 65 | Resp 18 | Ht 67.0 in | Wt 281.0 lb

## 2022-08-13 DIAGNOSIS — I890 Lymphedema, not elsewhere classified: Secondary | ICD-10-CM | POA: Diagnosis not present

## 2022-08-13 DIAGNOSIS — N1831 Chronic kidney disease, stage 3a: Secondary | ICD-10-CM | POA: Diagnosis not present

## 2022-08-13 DIAGNOSIS — E1122 Type 2 diabetes mellitus with diabetic chronic kidney disease: Secondary | ICD-10-CM

## 2022-08-13 DIAGNOSIS — Z794 Long term (current) use of insulin: Secondary | ICD-10-CM | POA: Diagnosis not present

## 2022-08-13 DIAGNOSIS — N183 Chronic kidney disease, stage 3 unspecified: Secondary | ICD-10-CM

## 2022-08-13 DIAGNOSIS — I10 Essential (primary) hypertension: Secondary | ICD-10-CM | POA: Diagnosis not present

## 2022-08-13 DIAGNOSIS — I872 Venous insufficiency (chronic) (peripheral): Secondary | ICD-10-CM | POA: Diagnosis not present

## 2022-08-13 NOTE — Progress Notes (Unsigned)
MRN : [id number]  Bethany [name] is a 77 y.o. (Sep 18, 1945) female who presents with chief complaint of  Chief Complaint  Patient presents with   Follow-up    f/u in 1 year with no studies  .  History of Present Illness: Patient returns today in follow up of her leg swelling and lymphedema.  Her symptoms are about the same.  Compression, elevation, and exercise have not improved her symptoms.  She was recently measured and sized for her lymphedema pump but has not yet received this.  Her activity level still is limited by her lumbar spinal disease and resultant neurologic pain as well.  No new ulceration or infection.  No fevers or chills.  Current Outpatient Medications  Medication Sig Dispense Refill   Alcohol Swabs (DROPSAFE ALCOHOL PREP) 70 % PADS USE TWICE DAILY 200 each 3   amLODipine (NORVASC) 2.5 MG tablet Take 1 tablet (2.5 mg total) by mouth daily. 90 tablet 1   atorvastatin (LIPITOR) 20 MG tablet TAKE 1 TABLET EVERY DAY 90 tablet 3   Blood Glucose Monitoring Suppl (TRUE METRIX METER) DEVI by Does not apply route.     Continuous Blood Gluc Sensor (DEXCOM G6 SENSOR) MISC 1 Device by Does not apply route See admin instructions. Change every 10 days 9 each 3   Cyanocobalamin (B-12 COMPLIANCE INJECTION IJ) Inject as directed.     dexamethasone (DECADRON) 1 MG tablet Take 1 mg by mouth daily.     diclofenac Sodium (VOLTAREN) 1 % GEL Voltaren 1 % topical gel  APPLY 2 GRAMS TO THE AFFECTED AREA(S) BY TOPICAL ROUTE 4 TIMES PER DAY     docusate sodium (COLACE) 100 MG capsule Take 100 mg by mouth daily as needed for mild constipation. Reported on 03/29/2015     DROPLET INSULIN SYRINGE 30G X 1/2" 1 ML MISC USE TO INJECT INSULIN EVERY DAY WITH BREAKFAST 100 each 2   furosemide (LASIX) 20 MG tablet TAKE 1 TABLET EVERY DAY AS NEEDED (Patient taking differently: 40 mg. TAKE 2 TABLET EVERY DAY AS NEEDED) 90 tablet 0   gabapentin (NEURONTIN) 100 MG capsule Take 1 capsule (100 mg total) by mouth 3  (three) times daily. 90 capsule 3   glucose blood (TRUE METRIX BLOOD GLUCOSE TEST) test strip 1 each by Other route 4 (four) times daily. 400 each 1   insulin NPH-regular Human (NOVOLIN 70/30) (70-30) 100 UNIT/ML injection Inject 40 Units into the skin daily with breakfast. 50 mL 2   losartan (COZAAR) 100 MG tablet Take 1 tablet (100 mg total) by mouth daily. 90 tablet 1   metoprolol succinate (TOPROL-XL) 50 MG 24 hr tablet Take 1 tablet (50 mg total) by mouth daily. Take with or immediately following a meal. 7 tablet 0   metoprolol succinate (TOPROL-XL) 50 MG 24 hr tablet Take 1 tablet (50 mg total) by mouth daily. Take with or immediately following a meal. 90 tablet 3   NOVOLOG FLEXPEN 100 UNIT/ML FlexPen Inject into the skin.     TOUJEO SOLOSTAR 300 UNIT/ML Solostar Pen Inject into the skin.     triamcinolone cream (KENALOG) 0.1 % APPLY TOPICALLY 4 (FOUR) TIMES DAILY AS NEEDED FOR ITCHING 80 g 2   TRUEplus Lancets 33G MISC TEST BLOOD SUGAR TWICE DAILY 200 each 3   No current facility-administered medications for this visit.    Past Medical History:  Diagnosis Date   Arthritis    Chronic kidney disease    Diabetes mellitus without complication (HCC)  Type II   Fibromyalgia    GERD (gastroesophageal reflux disease)    better after gall bladder surgery-    Hyperlipidemia    Hypertension    Shortness of breath dyspnea    with exertion    Past Surgical History:  Procedure Laterality Date   ABDOMINAL HYSTERECTOMY  01/15/1995   CATARACT EXTRACTION Bilateral 01/15/2012   CHOLECYSTECTOMY  01/15/2008   COLONOSCOPY     COLONOSCOPY WITH PROPOFOL N/A 04/21/2015   Procedure: COLONOSCOPY WITH PROPOFOL;  Surgeon: Wallace Cullens, MD;  Location: [facility] ENDOSCOPY;  Service: Gastroenterology;  Laterality: N/A;   EYE SURGERY Bilateral    Cataract   LUMBAR WOUND DEBRIDEMENT N/A 10/20/2014   Procedure: LUMBAR WOUND DEBRIDEMENT;  Surgeon: Tia Alert, MD;  Location: [facility];  Service:  Neurosurgery;  Laterality: N/A;   MAXIMUM ACCESS (MAS)POSTERIOR LUMBAR INTERBODY FUSION (PLIF) 1 LEVEL N/A 09/16/2014   Procedure: L/4-5 FOR MAXIMUM ACCESS (MAS) POSTERIOR LUMBAR INTERBODY FUSION (PLIF) 1 LEVEL;  Surgeon: Tia Alert, MD;  Location: [facility];  Service: Neurosurgery;  Laterality: N/A;  L/4-5 FOR MAXIMUM ACCESS (MAS) POSTERIOR LUMBAR INTERBODY FUSION (PLIF) 1 LEVEL   REFRACTIVE SURGERY Left      Social History   Tobacco Use   Smoking status: Never   Smokeless tobacco: Never  Vaping Use   Vaping status: Never Used  Substance Use Topics   Alcohol use: No   Drug use: No       Family History  Problem Relation Age of Onset   Arthritis Mother    Cancer Mother        Ovarian   Diabetes Mother    Heart attack Father    Heart disease Father    Hypertension Father    Cancer Brother    Diabetes Brother      No Known Allergies   REVIEW OF SYSTEMS (Negative unless checked)  Constitutional: [] Weight loss  [] Fever  [] Chills Cardiac: [] Chest pain   [] Chest pressure   [] Palpitations   [] Shortness of breath when laying flat   [] Shortness of breath at rest   [x] Shortness of breath with exertion. Vascular:  [x] Pain in legs with walking   [] Pain in legs at rest   [] Pain in legs when laying flat   [] Claudication   [] Pain in feet when walking  [] Pain in feet at rest  [] Pain in feet when laying flat   [] History of DVT   [] Phlebitis   [x] Swelling in legs   [] Varicose veins   [] Non-healing ulcers Pulmonary:   [] Uses home oxygen   [] Productive cough   [] Hemoptysis   [] Wheeze  [] COPD   [] Asthma Neurologic:  [] Dizziness  [] Blackouts   [] Seizures   [] History of stroke   [] History of TIA  [] Aphasia   [] Temporary blindness   [] Dysphagia   [] Weakness or numbness in arms   [] Weakness or numbness in legs Musculoskeletal:  [x] Arthritis   [] Joint swelling   [] Joint pain   [x] Low back pain Hematologic:  [] Easy bruising  [] Easy bleeding   [] Hypercoagulable state   [] Anemic    Gastrointestinal:  [] Blood in stool   [] Vomiting blood  [x] Gastroesophageal reflux/heartburn   [] Abdominal pain Genitourinary:  [x] Chronic kidney disease   [] Difficult urination  [] Frequent urination  [] Burning with urination   [] Hematuria Skin:  [] Rashes   [] Ulcers   [] Wounds Psychological:  [] History of anxiety   []  History of major depression.  Physical Examination  BP (!) 166/74 (BP Location: Left Wrist)   Pulse 65  Resp 18   Ht 5\' 7"  (1.702 m)   Wt 281 lb (127.5 kg)   BMI 44.01 kg/m  Gen:  WD/WN, NAD. Obese  Head: [place]/AT, No temporalis wasting. Ear/Nose/Throat: Hearing grossly intact, nares w/o erythema or drainage Eyes: Conjunctiva clear. Sclera non-icteric Neck: Supple.  Trachea midline Pulmonary:  Good air movement, no use of accessory muscles.  Cardiac: RRR, no JVD Vascular:  Vessel Right Left  Radial Palpable Palpable           Musculoskeletal: M/S 5/5 throughout.  No deformity or atrophy. 2+ BLE edema. Neurologic: Sensation grossly intact in extremities.  Symmetrical.  Speech is fluent.  Psychiatric: Judgment intact, Mood & affect appropriate for pt's clinical situation. Dermatologic: No rashes or ulcers noted.  No cellulitis or open wounds.     Labs No results found for this or any previous visit (from the past 2160 hour(s)).  Radiology MM 3D SCREENING MAMMOGRAM BILATERAL BREAST  Result Date: 07/17/2022 CLINICAL DATA:  Screening. EXAM: DIGITAL SCREENING BILATERAL MAMMOGRAM WITH TOMOSYNTHESIS AND CAD TECHNIQUE: Bilateral screening digital craniocaudal and mediolateral oblique mammograms were obtained. Bilateral screening digital breast tomosynthesis was performed. The images were evaluated with computer-aided detection. COMPARISON:  Previous exam(s). ACR Breast Density Category b: There are scattered areas of fibroglandular density. FINDINGS: There are no findings suspicious for malignancy. IMPRESSION: No mammographic evidence of malignancy. A result letter of  this screening mammogram will be mailed directly to the patient. RECOMMENDATION: Screening mammogram in one year. (Code:SM-B-01Y) BI-RADS CATEGORY  1: Negative. Electronically Signed   By: Frederico Hamman M.D.   On: 07/17/2022 10:25    Assessment/Plan  Essential hypertension blood pressure control important in reducing the progression of atherosclerotic disease. On appropriate oral medications.   Diabetes (HCC) blood glucose control important in reducing the progression of atherosclerotic disease. Also, involved in wound healing. On appropriate medications.   Chronic kidney disease, stage 3a (HCC) Worsens LE swelling.  Lymphedema Symptoms remain severe and debilitating.  Stage II lymphedema.  Awaiting her lymphedema pump.  Follow-up in 6 months.    Festus Barren, MD  08/14/2022 10:46 AM    This note was created with Dragon medical transcription system.  Any errors from dictation are purely unintentional

## 2022-08-13 NOTE — Assessment & Plan Note (Signed)
blood pressure control important in reducing the progression of atherosclerotic disease. On appropriate oral medications.  

## 2022-08-13 NOTE — Assessment & Plan Note (Signed)
Worsens LE swelling 

## 2022-08-13 NOTE — Assessment & Plan Note (Signed)
blood glucose control important in reducing the progression of atherosclerotic disease. Also, involved in wound healing. On appropriate medications.  

## 2022-08-14 NOTE — Assessment & Plan Note (Signed)
Symptoms remain severe and debilitating.  Stage II lymphedema.  Awaiting her lymphedema pump.  Follow-up in 6 months.

## 2022-08-19 ENCOUNTER — Other Ambulatory Visit: Payer: Self-pay | Admitting: Physician Assistant

## 2022-08-20 DIAGNOSIS — E1165 Type 2 diabetes mellitus with hyperglycemia: Secondary | ICD-10-CM | POA: Diagnosis not present

## 2022-09-02 ENCOUNTER — Telehealth: Payer: Self-pay

## 2022-09-02 ENCOUNTER — Telehealth: Payer: Self-pay | Admitting: Physician Assistant

## 2022-09-02 NOTE — Telephone Encounter (Signed)
Received [facility] order form. Gave to lauren for signature-nm

## 2022-09-02 NOTE — Telephone Encounter (Signed)
Faxed CGM for dexcom

## 2022-09-09 ENCOUNTER — Telehealth: Payer: Self-pay

## 2022-09-09 NOTE — Telephone Encounter (Signed)
Pt called her glucose is running high 260 in morning she used to she dr Kerrie Pleasure made her appt with [facility]

## 2022-09-10 ENCOUNTER — Encounter: Payer: Self-pay | Admitting: Internal Medicine

## 2022-09-10 ENCOUNTER — Ambulatory Visit (INDEPENDENT_AMBULATORY_CARE_PROVIDER_SITE_OTHER): Payer: Medicare HMO | Admitting: Internal Medicine

## 2022-09-10 VITALS — BP 150/70 | HR 80 | Temp 98.3°F | Resp 16 | Ht 67.0 in | Wt 280.4 lb

## 2022-09-10 DIAGNOSIS — G4733 Obstructive sleep apnea (adult) (pediatric): Secondary | ICD-10-CM | POA: Diagnosis not present

## 2022-09-10 DIAGNOSIS — E1151 Type 2 diabetes mellitus with diabetic peripheral angiopathy without gangrene: Secondary | ICD-10-CM

## 2022-09-10 DIAGNOSIS — Z794 Long term (current) use of insulin: Secondary | ICD-10-CM | POA: Diagnosis not present

## 2022-09-10 DIAGNOSIS — E1165 Type 2 diabetes mellitus with hyperglycemia: Secondary | ICD-10-CM | POA: Diagnosis not present

## 2022-09-10 LAB — POCT GLYCOSYLATED HEMOGLOBIN (HGB A1C): Hemoglobin A1C: 10.5 % — AB (ref 4.0–5.6)

## 2022-09-10 NOTE — Progress Notes (Signed)
[facility] [address] [place], [place] [address]  Internal MEDICINE  Office Visit Note  Patient Name: Bethany [name]  [id number]  [id number]  Date of Service: 09/24/2022  Chief Complaint  Patient presents with   Acute Visit   Diabetes    Patient has been having increased glucose readings. Last reading was at 9:15 this morning: 240.    HPI Pt is seen for acute visit for uncontrolled DM Each meal 8 units of Novolog flex pen and Toujea 20 units at bedtime Does not monitor her sugar or diet Has OSA but no CPAP, Feels like it will not work for her  Will need to be seen at the sleep clinic    Current Medication: Outpatient Encounter Medications as of 09/10/2022  Medication Sig   Alcohol Swabs (DROPSAFE ALCOHOL PREP) 70 % PADS USE TWICE DAILY   amLODipine (NORVASC) 2.5 MG tablet Take 1 tablet (2.5 mg total) by mouth daily.   atorvastatin (LIPITOR) 20 MG tablet TAKE 1 TABLET EVERY DAY   Blood Glucose Monitoring Suppl (TRUE METRIX METER) DEVI by Does not apply route.   Continuous Blood Gluc Sensor (DEXCOM G6 SENSOR) MISC 1 Device by Does not apply route See admin instructions. Change every 10 days   Cyanocobalamin (B-12 COMPLIANCE INJECTION IJ) Inject as directed.   diclofenac Sodium (VOLTAREN) 1 % GEL Voltaren 1 % topical gel  APPLY 2 GRAMS TO THE AFFECTED AREA(S) BY TOPICAL ROUTE 4 TIMES PER DAY   docusate sodium (COLACE) 100 MG capsule Take 100 mg by mouth daily as needed for mild constipation. Reported on 03/29/2015   DROPLET INSULIN SYRINGE 30G X 1/2" 1 ML MISC USE TO INJECT INSULIN EVERY DAY WITH BREAKFAST   furosemide (LASIX) 20 MG tablet TAKE 1 TABLET EVERY DAY AS NEEDED (Patient taking differently: 40 mg. TAKE 2 TABLET EVERY DAY AS NEEDED)   gabapentin (NEURONTIN) 100 MG capsule Take 1 capsule (100 mg total) by mouth 3 (three) times daily.   glucose blood (TRUE METRIX BLOOD GLUCOSE TEST) test strip 1 each by Other route 4 (four) times daily.   insulin NPH-regular  Human (NOVOLIN 70/30) (70-30) 100 UNIT/ML injection Inject 40 Units into the skin daily with breakfast.   metoprolol succinate (TOPROL-XL) 50 MG 24 hr tablet Take 1 tablet (50 mg total) by mouth daily. Take with or immediately following a meal.   metoprolol succinate (TOPROL-XL) 50 MG 24 hr tablet Take 1 tablet (50 mg total) by mouth daily. Take with or immediately following a meal.   NOVOLOG FLEXPEN 100 UNIT/ML FlexPen Inject into the skin.   TOUJEO SOLOSTAR 300 UNIT/ML Solostar Pen Inject into the skin.   triamcinolone cream (KENALOG) 0.1 % APPLY TOPICALLY 4 (FOUR) TIMES DAILY AS NEEDED FOR ITCHING   TRUEplus Lancets 33G MISC TEST BLOOD SUGAR TWICE DAILY   [DISCONTINUED] dexamethasone (DECADRON) 1 MG tablet Take 1 mg by mouth daily.   [DISCONTINUED] losartan (COZAAR) 100 MG tablet Take 1 tablet (100 mg total) by mouth daily.   No facility-administered encounter medications on file as of 09/10/2022.    Surgical History: Past Surgical History:  Procedure Laterality Date   ABDOMINAL HYSTERECTOMY  01/15/1995   CATARACT EXTRACTION Bilateral 01/15/2012   CHOLECYSTECTOMY  01/15/2008   COLONOSCOPY     COLONOSCOPY WITH PROPOFOL N/A 04/21/2015   Procedure: COLONOSCOPY WITH PROPOFOL;  Surgeon: Wallace Cullens, MD;  Location: [facility]COPY;  Service: Gastroenterology;  Laterality: N/A;   EYE SURGERY Bilateral    Cataract   LUMBAR  WOUND DEBRIDEMENT N/A 10/20/2014   Procedure: LUMBAR WOUND DEBRIDEMENT;  Surgeon: Tia Alert, MD;  Location: [facility];  Service: Neurosurgery;  Laterality: N/A;   MAXIMUM ACCESS (MAS)POSTERIOR LUMBAR INTERBODY FUSION (PLIF) 1 LEVEL N/A 09/16/2014   Procedure: L/4-5 FOR MAXIMUM ACCESS (MAS) POSTERIOR LUMBAR INTERBODY FUSION (PLIF) 1 LEVEL;  Surgeon: Tia Alert, MD;  Location: [facility];  Service: Neurosurgery;  Laterality: N/A;  L/4-5 FOR MAXIMUM ACCESS (MAS) POSTERIOR LUMBAR INTERBODY FUSION (PLIF) 1 LEVEL   REFRACTIVE SURGERY Left     Medical History: Past  Medical History:  Diagnosis Date   Arthritis    Chronic kidney disease    Diabetes mellitus without complication (HCC)    Type II   Fibromyalgia    GERD (gastroesophageal reflux disease)    better after gall bladder surgery-    Hyperlipidemia    Hypertension    Shortness of breath dyspnea    with exertion    Family History: Family History  Problem Relation Age of Onset   Arthritis Mother    Cancer Mother        Ovarian   Diabetes Mother    Heart attack Father    Heart disease Father    Hypertension Father    Cancer Brother    Diabetes Brother     Social History   Socioeconomic History   Marital status: Widowed    Spouse name: Not on file   Number of children: Not on file   Years of education: Not on file   Highest education level: Not on file  Occupational History   Not on file  Tobacco Use   Smoking status: Never   Smokeless tobacco: Never  Vaping Use   Vaping status: Never Used  Substance and Sexual Activity   Alcohol use: No   Drug use: No   Sexual activity: Not Currently  Other Topics Concern   Not on file  Social History Narrative   Not on file   Social Determinants of Health   Financial Resource Strain: Not on file  Food Insecurity: Not on file  Transportation Needs: Not on file  Physical Activity: Not on file  Stress: Not on file  Social Connections: Not on file  Intimate Partner Violence: Not on file      Review of Systems  Constitutional:  Negative for fatigue and fever.  HENT:  Negative for congestion, mouth sores and postnasal drip.   Respiratory:  Negative for cough.   Cardiovascular:  Negative for chest pain.  Genitourinary:  Negative for flank pain.  Psychiatric/Behavioral: Negative.      Vital Signs: BP (!) 150/70   Pulse 80   Temp 98.3 F (36.8 C)   Resp 16   Ht 5\' 7"  (1.702 m)   Wt 280 lb 6.4 oz (127.2 kg)   SpO2 96%   BMI 43.92 kg/m    Physical Exam Constitutional:      Appearance: Normal appearance.  HENT:      Head: Normocephalic and atraumatic.     Nose: Nose normal.     Mouth/Throat:     Mouth: Mucous membranes are moist.     Pharynx: No posterior oropharyngeal erythema.  Eyes:     Extraocular Movements: Extraocular movements intact.     Pupils: Pupils are equal, round, and reactive to light.  Cardiovascular:     Pulses: Normal pulses.     Heart sounds: Normal heart sounds.  Pulmonary:     Effort: Pulmonary effort is normal.  Breath sounds: Normal breath sounds.  Neurological:     General: No focal deficit present.     Mental Status: She is alert.  Psychiatric:        Mood and Affect: Mood normal.        Behavior: Behavior normal.        Assessment/Plan: 1. Type 2 diabetes mellitus with hyperglycemia, with long-term current use of insulin (HCC) Uncontrolled dm Following changes are made  Will DC mealtime insulin during the day Pt has samples of Mix 70/30, will inject 12 units in am with breakfast ( 10 am)  Use 20 units of Toujeo along with 10 units of Novolog Will adjust meds according to her readings  - POCT HgB A1C  2. OSA (obstructive sleep apnea) Pt might need re-titration or autopap  3. Morbid (severe) obesity due to excess calories (HCC) Monitor calories at home    General Counseling: Bethany [name] verbalizes understanding of the findings of todays visit and agrees with plan of treatment. I have discussed any further diagnostic evaluation that may be needed or ordered today. We also reviewed her medications today. she has been encouraged to call the office with any questions or concerns that should arise related to todays visit.    Orders Placed This Encounter  Procedures   POCT HgB A1C    No orders of the defined types were placed in this encounter.   Total time spent:45 Minutes Time spent includes review of chart, medications, test results, and follow up plan with the patient.   [place] Controlled Substance Database was reviewed by me.   Dr Lyndon Code Internal medicine

## 2022-09-16 ENCOUNTER — Other Ambulatory Visit: Payer: Self-pay | Admitting: Cardiovascular Disease

## 2022-09-16 DIAGNOSIS — I10 Essential (primary) hypertension: Secondary | ICD-10-CM

## 2022-09-17 NOTE — Telephone Encounter (Signed)
Hi,   Could you schedule the patient a 6 month follow up visit? The patient was last seen by Dr. Kirke Corin on 02-12-22. Thank you so much.

## 2022-09-23 IMAGING — US US RENAL
1 series · 14 of 25 positions shown · non-contrast
Comparison: None.

CLINICAL DATA: Proteinuria, CKD

EXAM:
RENAL / URINARY TRACT ULTRASOUND COMPLETE

[Series 1: us renal · 14 of 31 slices shown]
[im 1/31]
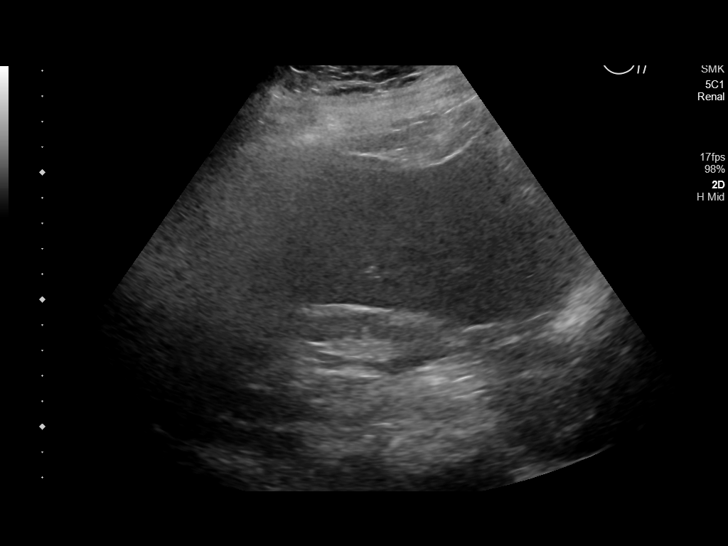
[im 3/31]
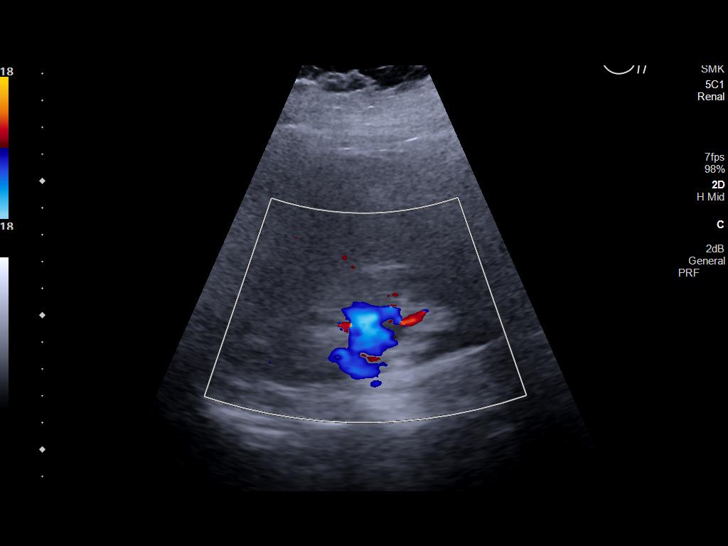
[im 6/31]
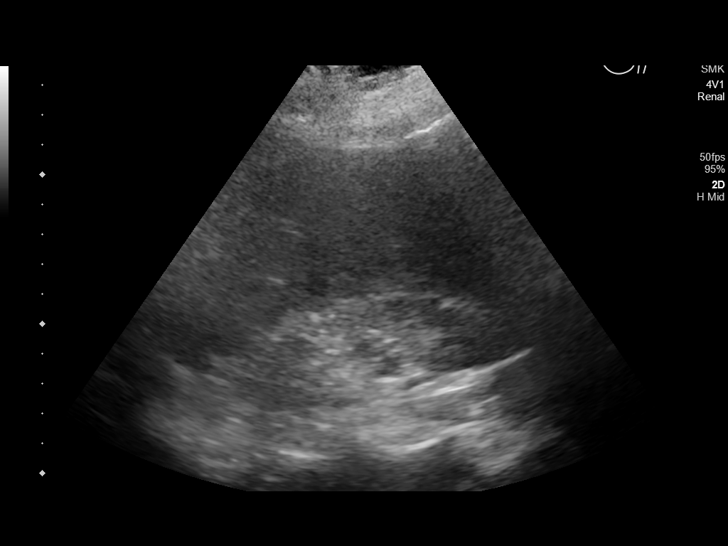
[im 8/31]
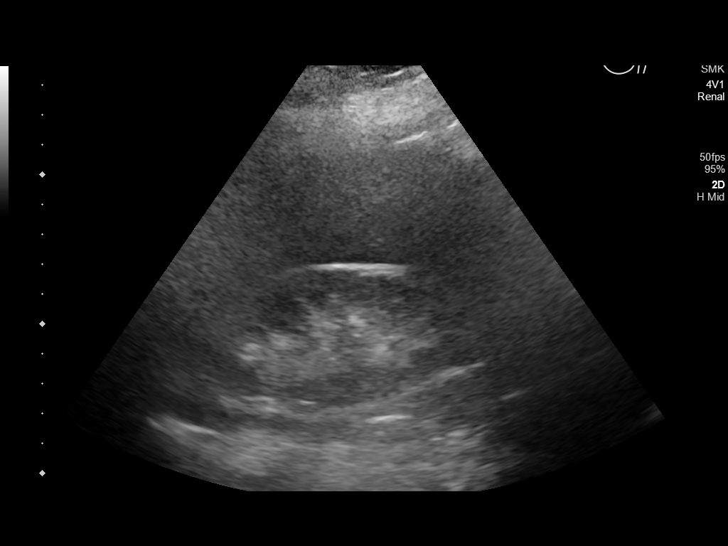
[im 11/31]
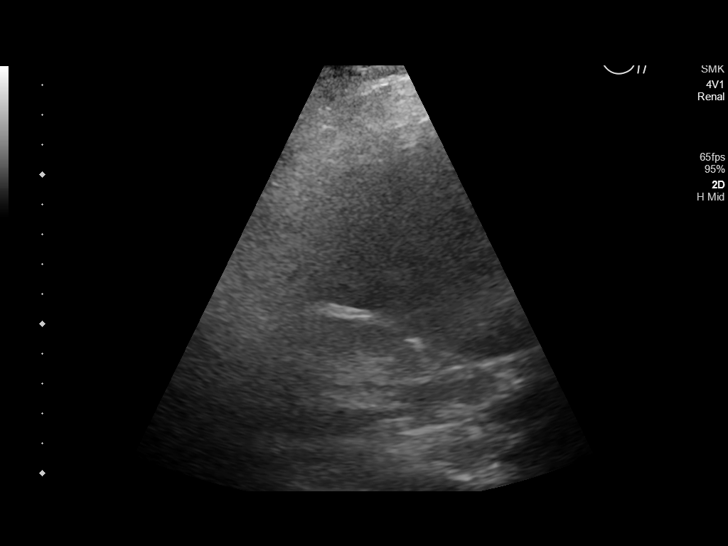
[im 12/31]
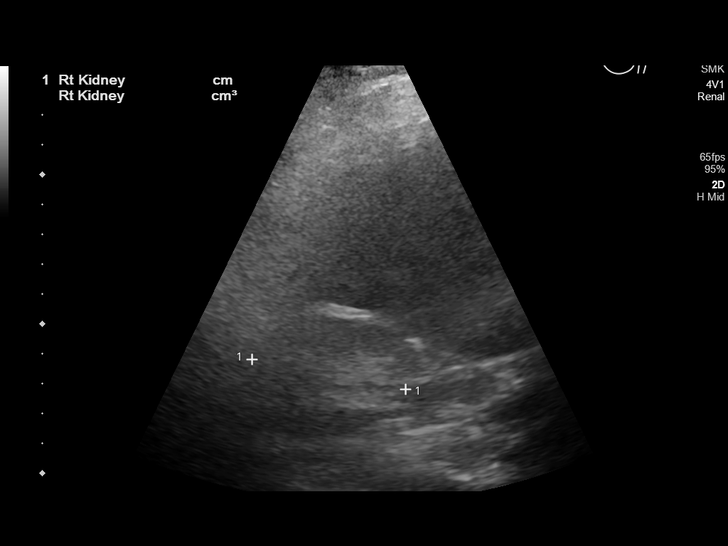
[im 14/31]
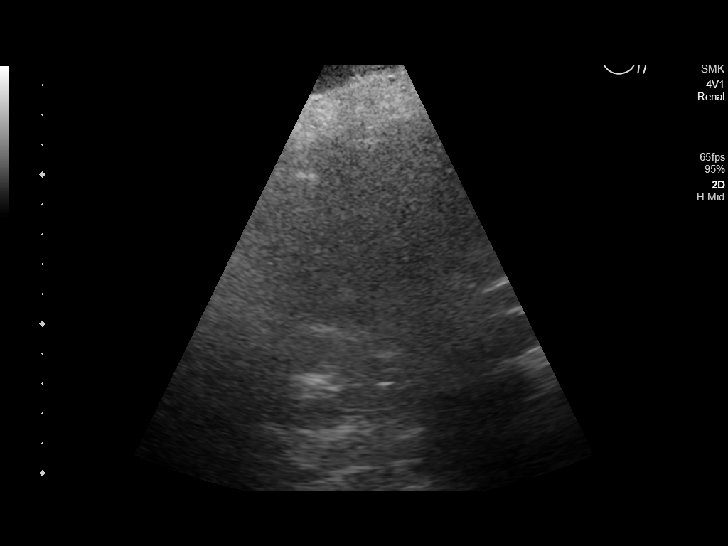
[im 17/31]
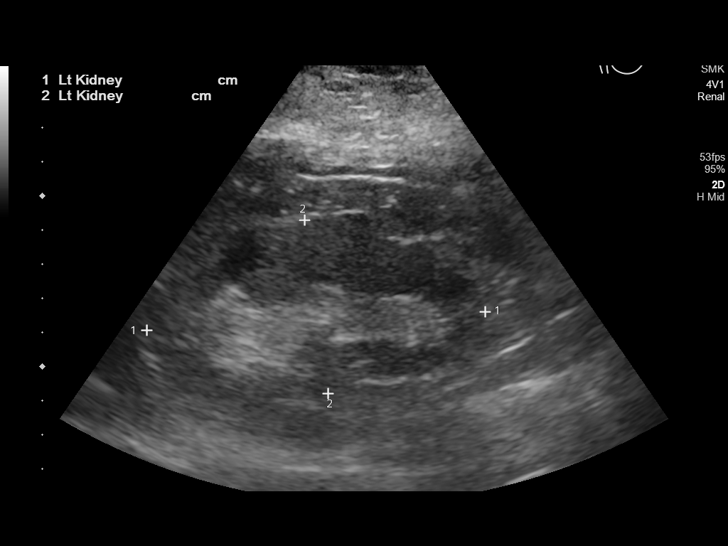
[im 19/31]
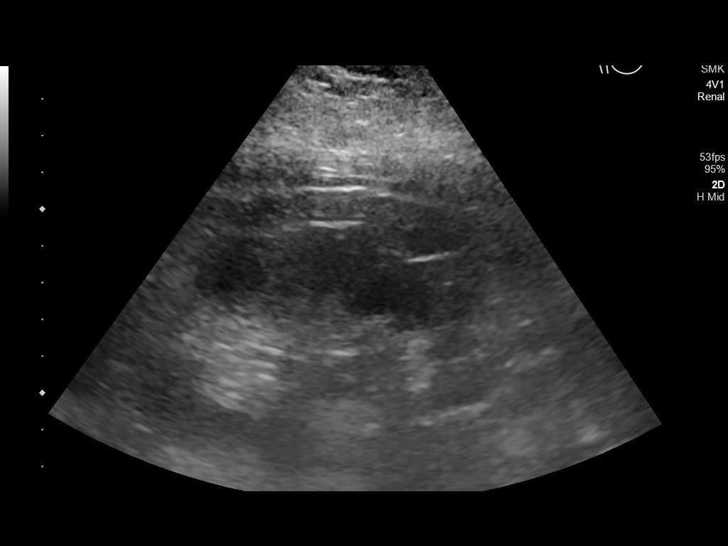
[im 21/31]
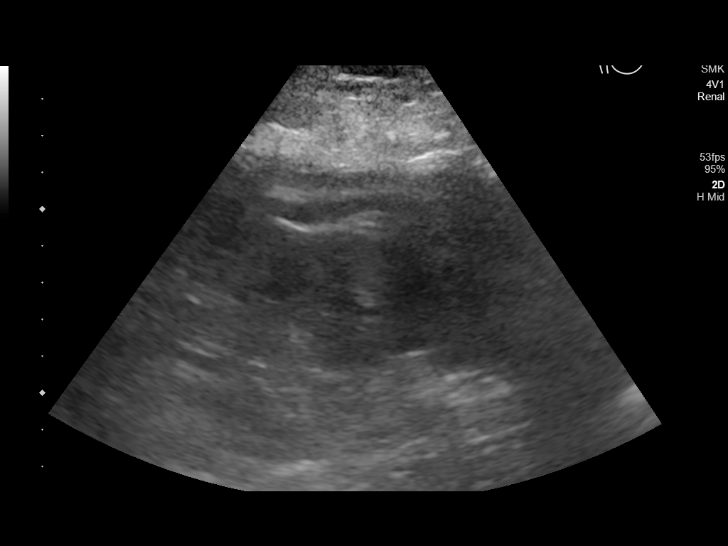
[im 23/31]
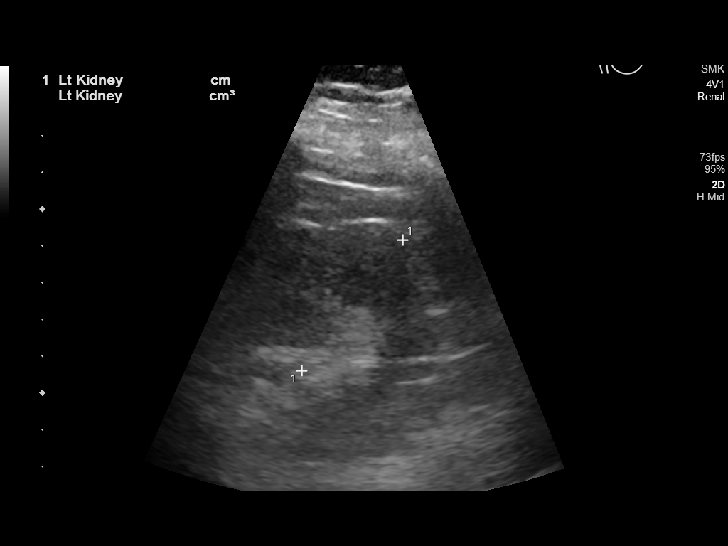
[im 26/31]
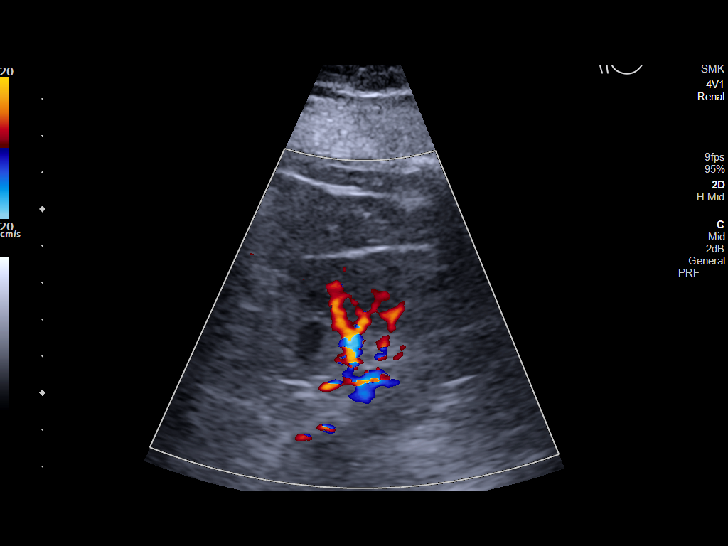
[im 28/31]
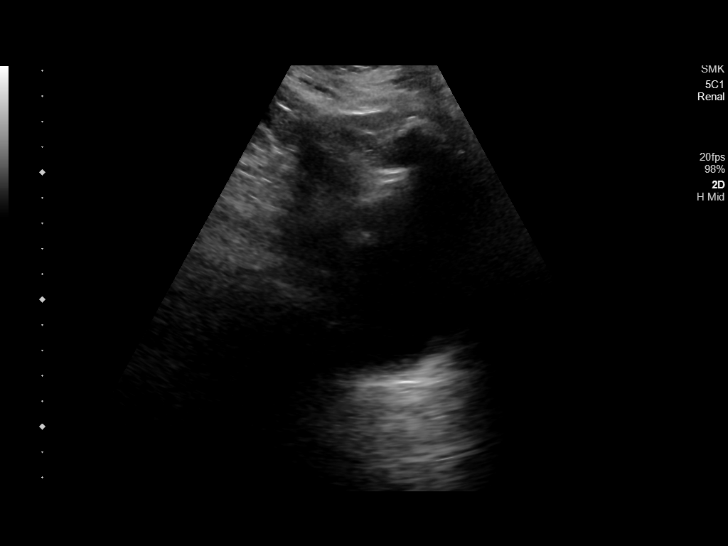
[im 31/31]
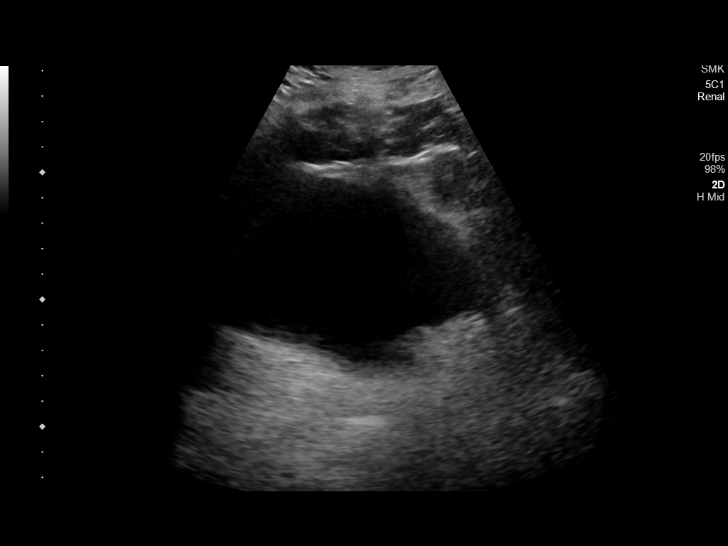

[14 of 25 positions shown; findings below may reference images not displayed]

FINDINGS: Right Kidney:

Renal measurements: 10.3 x 4.6 x 5.2 cm = volume: 129 mL.
Echogenicity appears mildly increased. No mass or hydronephrosis
visualized.

Left Kidney:

Renal measurements: 9.9 x 5.1 x 4.5 cm = volume: 120 mL.
Echogenicity appears mildly increased. No mass or hydronephrosis
visualized.

Bladder:

Appears normal for degree of bladder distention.

Other:

Exam limited by patient body habitus.
IMPRESSION: Limited exam. No definite acute finding. Increased echogenicity of
the bilateral kidneys as can be seen in medical renal disease.

## 2022-09-26 ENCOUNTER — Telehealth: Payer: Self-pay | Admitting: Physician Assistant

## 2022-09-26 ENCOUNTER — Encounter: Payer: Self-pay | Admitting: Physician Assistant

## 2022-09-26 ENCOUNTER — Ambulatory Visit: Payer: Medicare HMO | Admitting: Physician Assistant

## 2022-09-26 ENCOUNTER — Ambulatory Visit (INDEPENDENT_AMBULATORY_CARE_PROVIDER_SITE_OTHER): Payer: Medicare HMO | Admitting: Physician Assistant

## 2022-09-26 VITALS — BP 125/69 | HR 64 | Temp 97.8°F | Resp 16 | Ht 67.0 in | Wt 283.0 lb

## 2022-09-26 DIAGNOSIS — E1165 Type 2 diabetes mellitus with hyperglycemia: Secondary | ICD-10-CM

## 2022-09-26 DIAGNOSIS — G4733 Obstructive sleep apnea (adult) (pediatric): Secondary | ICD-10-CM | POA: Diagnosis not present

## 2022-09-26 DIAGNOSIS — I10 Essential (primary) hypertension: Secondary | ICD-10-CM

## 2022-09-26 DIAGNOSIS — Z794 Long term (current) use of insulin: Secondary | ICD-10-CM

## 2022-09-26 DIAGNOSIS — Z6841 Body Mass Index (BMI) 40.0 and over, adult: Secondary | ICD-10-CM

## 2022-09-26 MED ORDER — TIRZEPATIDE 2.5 MG/0.5ML ~~LOC~~ SOAJ
2.5000 mg | SUBCUTANEOUS | 0 refills | Status: DC
Start: 2022-09-26 — End: 2022-11-07

## 2022-09-26 NOTE — Telephone Encounter (Signed)
Awaiting 09/26/22 office notes for SS order-Toni

## 2022-09-26 NOTE — Progress Notes (Signed)
[facility] [address] [place], [place] [address]  Internal MEDICINE  Office Visit Note  Patient Name: Bethany [name]  [id number]  [id number]  Date of Service: 09/27/2022  Chief Complaint  Patient presents with   Follow-up   Diabetes   Gastroesophageal Reflux   Hypertension   Hyperlipidemia    HPI Pt is here for routine follow up  -Avg BG for last 14 days 207, high in AM frequently but just overall elevated still -Taking 12 units of Mix 70/30 in AM, 20units toujeo with 10units novalog in evening -states awhile back she was taking 55units of mix daily previously and was better controlled, but not recently. -Ozempic caused some itching, open to trying alternative. Would like to try another GLP1 to help BG and wt. Will try mounjaro, but discussed may still need to adjust insulin dosing -sleepy during the day,  sleep schedule if off but also has known OSA and is not on treatment. She states she could not tolerate last time but is open to retrying--may need alternative mask/headgear. Will order updated titration to restart -some indigestion at times and will feel a little tinge in chest, but takes antiacid and it goes away. Sees cardiology and will call if any new or worsening symptoms  Current Medication: Outpatient Encounter Medications as of 09/26/2022  Medication Sig   Alcohol Swabs (DROPSAFE ALCOHOL PREP) 70 % PADS USE TWICE DAILY   amLODipine (NORVASC) 2.5 MG tablet Take 1 tablet (2.5 mg total) by mouth daily.   atorvastatin (LIPITOR) 20 MG tablet TAKE 1 TABLET EVERY DAY   Blood Glucose Monitoring Suppl (TRUE METRIX METER) DEVI by Does not apply route.   Continuous Blood Gluc Sensor (DEXCOM G6 SENSOR) MISC 1 Device by Does not apply route See admin instructions. Change every 10 days   Cyanocobalamin (B-12 COMPLIANCE INJECTION IJ) Inject as directed.   diclofenac Sodium (VOLTAREN) 1 % GEL Voltaren 1 % topical gel  APPLY 2 GRAMS TO THE AFFECTED AREA(S) BY TOPICAL  ROUTE 4 TIMES PER DAY   docusate sodium (COLACE) 100 MG capsule Take 100 mg by mouth daily as needed for mild constipation. Reported on 03/29/2015   DROPLET INSULIN SYRINGE 30G X 1/2" 1 ML MISC USE TO INJECT INSULIN EVERY DAY WITH BREAKFAST   furosemide (LASIX) 20 MG tablet TAKE 1 TABLET EVERY DAY AS NEEDED (Patient taking differently: 40 mg. TAKE 2 TABLET EVERY DAY AS NEEDED)   gabapentin (NEURONTIN) 100 MG capsule Take 1 capsule (100 mg total) by mouth 3 (three) times daily.   glucose blood (TRUE METRIX BLOOD GLUCOSE TEST) test strip 1 each by Other route 4 (four) times daily.   insulin NPH-regular Human (NOVOLIN 70/30) (70-30) 100 UNIT/ML injection Inject 40 Units into the skin daily with breakfast.   losartan (COZAAR) 100 MG tablet Take 1 tablet (100 mg total) by mouth daily. Overdue follow up.  PLEASE CALL OFFICE TO SCHEDULE APPOINTMENT PRIOR TO NEXT REFILL   metoprolol succinate (TOPROL-XL) 50 MG 24 hr tablet Take 1 tablet (50 mg total) by mouth daily. Take with or immediately following a meal.   metoprolol succinate (TOPROL-XL) 50 MG 24 hr tablet Take 1 tablet (50 mg total) by mouth daily. Take with or immediately following a meal.   NOVOLOG FLEXPEN 100 UNIT/ML FlexPen Inject into the skin.   tirzepatide (MOUNJARO) 2.5 MG/0.5ML Pen Inject 2.5 mg into the skin once a week.   TOUJEO SOLOSTAR 300 UNIT/ML Solostar Pen Inject into the skin.   triamcinolone cream (  KENALOG) 0.1 % APPLY TOPICALLY 4 (FOUR) TIMES DAILY AS NEEDED FOR ITCHING   TRUEplus Lancets 33G MISC TEST BLOOD SUGAR TWICE DAILY   No facility-administered encounter medications on file as of 09/26/2022.    Surgical History: Past Surgical History:  Procedure Laterality Date   ABDOMINAL HYSTERECTOMY  01/15/1995   CATARACT EXTRACTION Bilateral 01/15/2012   CHOLECYSTECTOMY  01/15/2008   COLONOSCOPY     COLONOSCOPY WITH PROPOFOL N/A 04/21/2015   Procedure: COLONOSCOPY WITH PROPOFOL;  Surgeon: Wallace Cullens, MD;  Location: [facility]  ENDOSCOPY;  Service: Gastroenterology;  Laterality: N/A;   EYE SURGERY Bilateral    Cataract   LUMBAR WOUND DEBRIDEMENT N/A 10/20/2014   Procedure: LUMBAR WOUND DEBRIDEMENT;  Surgeon: Tia Alert, MD;  Location: [facility] NEURO ORS;  Service: Neurosurgery;  Laterality: N/A;   MAXIMUM ACCESS (MAS)POSTERIOR LUMBAR INTERBODY FUSION (PLIF) 1 LEVEL N/A 09/16/2014   Procedure: L/4-5 FOR MAXIMUM ACCESS (MAS) POSTERIOR LUMBAR INTERBODY FUSION (PLIF) 1 LEVEL;  Surgeon: Tia Alert, MD;  Location: [facility] NEURO ORS;  Service: Neurosurgery;  Laterality: N/A;  L/4-5 FOR MAXIMUM ACCESS (MAS) POSTERIOR LUMBAR INTERBODY FUSION (PLIF) 1 LEVEL   REFRACTIVE SURGERY Left     Medical History: Past Medical History:  Diagnosis Date   Arthritis    Chronic kidney disease    Diabetes mellitus without complication (HCC)    Type II   Fibromyalgia    GERD (gastroesophageal reflux disease)    better after gall bladder surgery-    Hyperlipidemia    Hypertension    Shortness of breath dyspnea    with exertion    Family History: Family History  Problem Relation Age of Onset   Arthritis Mother    Cancer Mother        Ovarian   Diabetes Mother    Heart attack Father    Heart disease Father    Hypertension Father    Cancer Brother    Diabetes Brother     Social History   Socioeconomic History   Marital status: Widowed    Spouse name: Not on file   Number of children: Not on file   Years of education: Not on file   Highest education level: Not on file  Occupational History   Not on file  Tobacco Use   Smoking status: Never   Smokeless tobacco: Never  Vaping Use   Vaping status: Never Used  Substance and Sexual Activity   Alcohol use: No   Drug use: No   Sexual activity: Not Currently  Other Topics Concern   Not on file  Social History Narrative   Not on file   Social Determinants of Health   Financial Resource Strain: Not on file  Food Insecurity: Not on file  Transportation Needs: Not on  file  Physical Activity: Not on file  Stress: Not on file  Social Connections: Not on file  Intimate Partner Violence: Not on file      Review of Systems  Constitutional:  Negative for chills, fatigue and unexpected weight change.  HENT:  Positive for postnasal drip. Negative for congestion, rhinorrhea, sneezing and sore throat.   Eyes:  Negative for redness.  Respiratory:  Negative for cough, chest tightness and shortness of breath.   Cardiovascular:  Negative for chest pain and palpitations.  Gastrointestinal:  Negative for abdominal pain, constipation, diarrhea, nausea and vomiting.  Genitourinary:  Negative for dysuria and frequency.  Musculoskeletal:  Positive for arthralgias, back pain and gait problem. Negative for joint swelling and  neck pain.  Skin:  Negative for rash.  Neurological:  Negative for tremors.  Hematological:  Negative for adenopathy. Does not bruise/bleed easily.  Psychiatric/Behavioral:  Negative for behavioral problems (Depression), sleep disturbance and suicidal ideas. The patient is not nervous/anxious.     Vital Signs: BP 125/69   Pulse 64   Temp 97.8 F (36.6 C)   Resp 16   Ht 5\' 7"  (1.702 m)   Wt 283 lb (128.4 kg)   SpO2 99%   BMI 44.32 kg/m    Physical Exam Vitals and nursing note reviewed.  Constitutional:      Appearance: Normal appearance.  HENT:     Head: Normocephalic and atraumatic.     Nose: Nose normal.     Mouth/Throat:     Mouth: Mucous membranes are moist.     Pharynx: No posterior oropharyngeal erythema.  Eyes:     Extraocular Movements: Extraocular movements intact.     Pupils: Pupils are equal, round, and reactive to light.  Cardiovascular:     Rate and Rhythm: Normal rate and regular rhythm.     Pulses: Normal pulses.     Heart sounds: Normal heart sounds.  Pulmonary:     Effort: Pulmonary effort is normal.     Breath sounds: Normal breath sounds.  Skin:    General: Skin is warm and dry.  Neurological:      General: No focal deficit present.     Mental Status: She is alert.  Psychiatric:        Mood and Affect: Mood normal.        Behavior: Behavior normal.        Assessment/Plan: 1. Type 2 diabetes mellitus with hyperglycemia, with long-term current use of insulin (HCC) Will add mounjaro, may need to adjust insulin further - Urine Microalbumin w/creat. ratio - tirzepatide (MOUNJARO) 2.5 MG/0.5ML Pen; Inject 2.5 mg into the skin once a week.  Dispense: 2 mL; Refill: 0  2. OSA (obstructive sleep apnea) Will order titration to restart treatment for known OSA with continued symptoms of daytime sleepiness and snoring - Cpap titration; Future  3. Benign hypertension Stable, continue current medication  4. Morbid obesity with BMI of 40.0-44.9, adult (HCC) Will add mounjaro to help BG and wt loss goals Obesity Counseling: Had a lengthy discussion regarding patients BMI and weight issues. Patient was instructed on portion control as well as increased activity. Also discussed caloric restrictions with trying to maintain intake less than 2000 Kcal. Discussions were made in accordance with the 5As of weight management. Simple actions such as not eating late and if able to, taking a walk is suggested.    General Counseling: Rhianna verbalizes understanding of the findings of todays visit and agrees with plan of treatment. I have discussed any further diagnostic evaluation that may be needed or ordered today. We also reviewed her medications today. she has been encouraged to call the office with any questions or concerns that should arise related to todays visit.    Orders Placed This Encounter  Procedures   Urine Microalbumin w/creat. ratio   Cpap titration    Meds ordered this encounter  Medications   tirzepatide (MOUNJARO) 2.5 MG/0.5ML Pen    Sig: Inject 2.5 mg into the skin once a week.    Dispense:  2 mL    Refill:  0    This patient was seen by Lynn Ito, PA-C in  collaboration with Dr. Beverely Risen as a part of collaborative care agreement.  Total time spent:30 Minutes Time spent includes review of chart, medications, test results, and follow up plan with the patient.      Dr Lyndon Code Internal medicine

## 2022-09-27 LAB — MICROALBUMIN / CREATININE URINE RATIO
Creatinine, Urine: 66.6 mg/dL
Microalb/Creat Ratio: 99 mg/g{creat} — ABNORMAL HIGH (ref 0–29)
Microalbumin, Urine: 66.1 ug/mL

## 2022-09-30 ENCOUNTER — Telehealth: Payer: Self-pay | Admitting: Physician Assistant

## 2022-09-30 NOTE — Telephone Encounter (Signed)
SS order faxed to [facility]; [phone]

## 2022-10-09 DIAGNOSIS — I10 Essential (primary) hypertension: Secondary | ICD-10-CM | POA: Diagnosis not present

## 2022-10-09 DIAGNOSIS — R609 Edema, unspecified: Secondary | ICD-10-CM | POA: Diagnosis not present

## 2022-10-09 DIAGNOSIS — E785 Hyperlipidemia, unspecified: Secondary | ICD-10-CM | POA: Diagnosis not present

## 2022-10-09 DIAGNOSIS — I509 Heart failure, unspecified: Secondary | ICD-10-CM | POA: Diagnosis not present

## 2022-10-09 DIAGNOSIS — E1122 Type 2 diabetes mellitus with diabetic chronic kidney disease: Secondary | ICD-10-CM | POA: Diagnosis not present

## 2022-10-09 DIAGNOSIS — E663 Overweight: Secondary | ICD-10-CM | POA: Diagnosis not present

## 2022-10-09 DIAGNOSIS — N1831 Chronic kidney disease, stage 3a: Secondary | ICD-10-CM | POA: Diagnosis not present

## 2022-10-09 DIAGNOSIS — I5042 Chronic combined systolic (congestive) and diastolic (congestive) heart failure: Secondary | ICD-10-CM | POA: Diagnosis not present

## 2022-10-10 ENCOUNTER — Ambulatory Visit (INDEPENDENT_AMBULATORY_CARE_PROVIDER_SITE_OTHER): Payer: Medicare HMO | Admitting: Nurse Practitioner

## 2022-10-10 ENCOUNTER — Encounter (INDEPENDENT_AMBULATORY_CARE_PROVIDER_SITE_OTHER): Payer: Self-pay | Admitting: Nurse Practitioner

## 2022-10-10 DIAGNOSIS — I5032 Chronic diastolic (congestive) heart failure: Secondary | ICD-10-CM | POA: Diagnosis not present

## 2022-10-10 DIAGNOSIS — N1831 Chronic kidney disease, stage 3a: Secondary | ICD-10-CM | POA: Diagnosis not present

## 2022-10-10 DIAGNOSIS — N183 Chronic kidney disease, stage 3 unspecified: Secondary | ICD-10-CM | POA: Diagnosis not present

## 2022-10-10 DIAGNOSIS — Z794 Long term (current) use of insulin: Secondary | ICD-10-CM

## 2022-10-10 DIAGNOSIS — I890 Lymphedema, not elsewhere classified: Secondary | ICD-10-CM | POA: Diagnosis not present

## 2022-10-10 DIAGNOSIS — E1122 Type 2 diabetes mellitus with diabetic chronic kidney disease: Secondary | ICD-10-CM

## 2022-10-14 ENCOUNTER — Encounter (INDEPENDENT_AMBULATORY_CARE_PROVIDER_SITE_OTHER): Payer: Self-pay | Admitting: Nurse Practitioner

## 2022-10-14 NOTE — Progress Notes (Signed)
Subjective:    Patient ID: Bethany [name], female    DOB: [birth year], 77 y.o.   MRN: [id number] Chief Complaint  Patient presents with   Follow-up    6 month follow up    Bethany [name] is a 77 year old female who returns today for follow-up evaluation of her lymphedema.  Several months ago she was in [employer] and her swelling has maintained since she has been removed from them.  There has been no development of any large open blisters or ulcerations.  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  She has been fit for lymphedema pump and should be receiving it soon.    Review of Systems  Cardiovascular:  Positive for leg swelling.  Musculoskeletal:  Positive for arthralgias.  All other systems reviewed and are negative.      Objective:   Physical Exam Vitals reviewed.  HENT:     Head: Normocephalic.  Cardiovascular:     Rate and Rhythm: Normal rate.  Pulmonary:     Effort: Pulmonary effort is normal.  Skin:    General: Skin is warm and dry.  Neurological:     Mental Status: She is alert and oriented to person, place, and time.  Psychiatric:        Mood and Affect: Mood normal.        Behavior: Behavior normal.        Thought Content: Thought content normal.        Judgment: Judgment normal.     BP (!) 159/78 (BP Location: Right Arm)   Pulse 73   Resp 16   Wt 281 lb 9.6 oz (127.7 kg)   BMI 44.10 kg/m   Past Medical History:  Diagnosis Date   Arthritis    Chronic kidney disease    Diabetes mellitus without complication (HCC)    Type II   Fibromyalgia    GERD (gastroesophageal reflux disease)    better after gall bladder surgery-    Hyperlipidemia    Hypertension    Shortness of breath dyspnea    with exertion    Social History   Socioeconomic History   Marital status: Widowed    Spouse name: Not on file   Number of children: Not on file   Years of education: Not on file   Highest education level: Not on file  Occupational History   Not on file   Tobacco Use   Smoking status: Never   Smokeless tobacco: Never  Vaping Use   Vaping status: Never Used  Substance and Sexual Activity   Alcohol use: No   Drug use: No   Sexual activity: Not Currently  Other Topics Concern   Not on file  Social History Narrative   Not on file   Social Determinants of Health   Financial Resource Strain: Not on file  Food Insecurity: Not on file  Transportation Needs: Not on file  Physical Activity: Not on file  Stress: Not on file  Social Connections: Not on file  Intimate Partner Violence: Not on file    Past Surgical History:  Procedure Laterality Date   ABDOMINAL HYSTERECTOMY  01/15/1995   CATARACT EXTRACTION Bilateral 01/15/2012   CHOLECYSTECTOMY  01/15/2008   COLONOSCOPY     COLONOSCOPY WITH PROPOFOL N/A 04/21/2015   Procedure: COLONOSCOPY WITH PROPOFOL;  Surgeon: Wallace Cullens, MD;  Location: [facility] ENDOSCOPY;  Service: Gastroenterology;  Laterality: N/A;   EYE SURGERY Bilateral    Cataract   LUMBAR WOUND DEBRIDEMENT N/A 10/20/2014  Procedure: LUMBAR WOUND DEBRIDEMENT;  Surgeon: Tia Alert, MD;  Location: [facility]UR[facility] OR[facility];  Service: Neurosurgery;  Laterality: N/A;   MAXIMUM ACCESS (MAS)POSTERIOR LUMBAR INTERBODY FUSION (PLIF) 1 LEVEL N/A 09/16/2014   Procedure: L/4-5 FOR MAXIMUM ACCESS (MAS) POSTERIOR LUMBAR INTERBODY FUSION (PLIF) 1 LEVEL;  Surgeon: Tia Alert, MD;  Location: [facility];  Service: Neurosurgery;  Laterality: N/A;  L/4-5 FOR MAXIMUM ACCESS (MAS) POSTERIOR LUMBAR INTERBODY FUSION (PLIF) 1 LEVEL   REFRACTIVE SURGERY Left     Family History  Problem Relation Age of Onset   Arthritis Mother    Cancer Mother        Ovarian   Diabetes Mother    Heart attack Father    Heart disease Father    Hypertension Father    Cancer Brother    Diabetes Brother     No Known Allergies     Latest Ref Rng & Units 06/12/2021   10:36 AM 01/27/2020    4:18 PM 06/29/2018   12:31 PM  CBC  WBC 3.4 - 10.8 x10E3/uL 8.0  8.6  7.3    Hemoglobin 11.1 - 15.9 g/dL 95.2  84.1  32.4   Hematocrit 34.0 - 46.6 % 39.1  37.7  38.0   Platelets 150 - 450 x10E3/uL 315  270.0  279.0       CMP     Component Value Date/Time   NA 144 06/12/2021 1036   K 4.8 06/12/2021 1036   CL 103 06/12/2021 1036   CO2 23 06/12/2021 1036   GLUCOSE 198 (H) 07/09/2021 1119   BUN 18 06/12/2021 1036   CREATININE 1.01 (H) 06/12/2021 1036   CALCIUM 9.8 06/12/2021 1036   PROT 7.4 06/12/2021 1036   ALBUMIN 4.2 06/12/2021 1036   AST 12 06/12/2021 1036   ALT 12 06/12/2021 1036   ALKPHOS 164 (H) 06/12/2021 1036   BILITOT 0.3 06/12/2021 1036   GFR 43.41 (L) 01/27/2020 1618   EGFR 57 (L) 06/12/2021 1036   GFRNONAA 55 (L) 10/20/2014 1039     No results found.     Assessment & Plan:   1. Lymphedema The patient continues with compression but is awaiting her lymphedema pump.  I do believe that once she has this it will greatly help with control of her lower extremity edema and her progression with her lymphedema.  2. CHF (congestive heart failure), NYHA class I, chronic, diastolic (HCC) Contributes to her swelling but appears to be under good control  3. Chronic kidney disease, stage 3a (HCC) This contributes likely to her swelling  4. Type 2 diabetes mellitus with stage 3 chronic kidney disease, with long-term current use of insulin, unspecified whether stage 3a or 3b CKD (HCC) Continue hypoglycemic medications as already ordered, these medications have been reviewed and there are no changes at this time.  Hgb A1C to be monitored as already arranged by primary service   Current Outpatient Medications on File Prior to Visit  Medication Sig Dispense Refill   Alcohol Swabs (DROPSAFE ALCOHOL PREP) 70 % PADS USE TWICE DAILY 200 each 3   amLODipine (NORVASC) 2.5 MG tablet Take 1 tablet (2.5 mg total) by mouth daily. 90 tablet 1   atorvastatin (LIPITOR) 20 MG tablet TAKE 1 TABLET EVERY DAY 90 tablet 3   Blood Glucose Monitoring Suppl (TRUE  METRIX METER) DEVI by Does not apply route.     Continuous Blood Gluc Sensor (DEXCOM G6 SENSOR) MISC 1 Device by Does not apply route See admin instructions.  Change every 10 days 9 each 3   Cyanocobalamin (B-12 COMPLIANCE INJECTION IJ) Inject as directed.     diclofenac Sodium (VOLTAREN) 1 % GEL Voltaren 1 % topical gel  APPLY 2 GRAMS TO THE AFFECTED AREA(S) BY TOPICAL ROUTE 4 TIMES PER DAY     docusate sodium (COLACE) 100 MG capsule Take 100 mg by mouth daily as needed for mild constipation. Reported on 03/29/2015     DROPLET INSULIN SYRINGE 30G X 1/2" 1 ML MISC USE TO INJECT INSULIN EVERY DAY WITH BREAKFAST 100 each 2   furosemide (LASIX) 20 MG tablet TAKE 1 TABLET EVERY DAY AS NEEDED (Patient taking differently: 40 mg. TAKE 2 TABLET EVERY DAY AS NEEDED) 90 tablet 0   gabapentin (NEURONTIN) 100 MG capsule Take 1 capsule (100 mg total) by mouth 3 (three) times daily. 90 capsule 3   glucose blood (TRUE METRIX BLOOD GLUCOSE TEST) test strip 1 each by Other route 4 (four) times daily. 400 each 1   insulin NPH-regular Human (NOVOLIN 70/30) (70-30) 100 UNIT/ML injection Inject 40 Units into the skin daily with breakfast. 50 mL 2   losartan (COZAAR) 100 MG tablet Take 1 tablet (100 mg total) by mouth daily. Overdue follow up.  PLEASE CALL OFFICE TO SCHEDULE APPOINTMENT PRIOR TO NEXT REFILL 30 tablet 0   metoprolol succinate (TOPROL-XL) 50 MG 24 hr tablet Take 1 tablet (50 mg total) by mouth daily. Take with or immediately following a meal. 7 tablet 0   metoprolol succinate (TOPROL-XL) 50 MG 24 hr tablet Take 1 tablet (50 mg total) by mouth daily. Take with or immediately following a meal. 90 tablet 3   NOVOLOG FLEXPEN 100 UNIT/ML FlexPen Inject into the skin.     tirzepatide (MOUNJARO) 2.5 MG/0.5ML Pen Inject 2.5 mg into the skin once a week. 2 mL 0   TOUJEO SOLOSTAR 300 UNIT/ML Solostar Pen Inject into the skin.     triamcinolone cream (KENALOG) 0.1 % APPLY TOPICALLY 4 (FOUR) TIMES DAILY AS NEEDED FOR  ITCHING 80 g 2   TRUEplus Lancets 33G MISC TEST BLOOD SUGAR TWICE DAILY 200 each 3   No current facility-administered medications on file prior to visit.    There are no Patient Instructions on file for this visit. No follow-ups on file.   Georgiana Spinner, NP

## 2022-11-07 ENCOUNTER — Telehealth: Payer: Self-pay | Admitting: Physician Assistant

## 2022-11-07 ENCOUNTER — Encounter: Payer: Self-pay | Admitting: Physician Assistant

## 2022-11-07 ENCOUNTER — Ambulatory Visit (INDEPENDENT_AMBULATORY_CARE_PROVIDER_SITE_OTHER): Payer: Medicare HMO | Admitting: Physician Assistant

## 2022-11-07 VITALS — BP 121/80 | HR 100 | Temp 97.8°F | Resp 16 | Ht 67.0 in | Wt 282.0 lb

## 2022-11-07 DIAGNOSIS — Z6841 Body Mass Index (BMI) 40.0 and over, adult: Secondary | ICD-10-CM

## 2022-11-07 DIAGNOSIS — Z794 Long term (current) use of insulin: Secondary | ICD-10-CM | POA: Diagnosis not present

## 2022-11-07 DIAGNOSIS — H9193 Unspecified hearing loss, bilateral: Secondary | ICD-10-CM

## 2022-11-07 DIAGNOSIS — H6123 Impacted cerumen, bilateral: Secondary | ICD-10-CM

## 2022-11-07 DIAGNOSIS — E1165 Type 2 diabetes mellitus with hyperglycemia: Secondary | ICD-10-CM | POA: Diagnosis not present

## 2022-11-07 MED ORDER — TIRZEPATIDE 5 MG/0.5ML ~~LOC~~ SOAJ
5.0000 mg | SUBCUTANEOUS | 2 refills | Status: DC
Start: 2022-11-07 — End: 2023-01-22

## 2022-11-07 NOTE — Progress Notes (Signed)
[facility] [address] [place], [place] [address]  Internal MEDICINE  Office Visit Note  Patient Name: Bethany [name]  [id number]  [id number]  Date of Service: 11/13/2022  Chief Complaint  Patient presents with   Follow-up   Diabetes   Gastroesophageal Reflux   Hypertension   Hyperlipidemia    HPI Pt is here for routine follow up -Did start mounjaro, tolerating well, would like to increase dose -sugars improving, but did have a low of 53, does get alerted by continuous monitor. Some 200s at meals -Sleep study not yet scheduled -pt did adjust her meds and has been taking 40 units of 70/30 mix in AM and not taking any other insulin. Given lows and increasing mounjaro, will drop this down and will increase as needed pending sugar response. Pt may try cutting in half to 20 units and increase depending on readings. -uses freestyle libre2 -vascular is trying to get leg pumps -impacted cerumen, recommended debrox, but pt would also like ENT referral. Has reduced hearing in addition to earwax building up now.   Current Medication: Outpatient Encounter Medications as of 11/07/2022  Medication Sig   Alcohol Swabs (DROPSAFE ALCOHOL PREP) 70 % PADS USE TWICE DAILY   amLODipine (NORVASC) 2.5 MG tablet Take 1 tablet (2.5 mg total) by mouth daily.   atorvastatin (LIPITOR) 20 MG tablet TAKE 1 TABLET EVERY DAY   Blood Glucose Monitoring Suppl (TRUE METRIX METER) DEVI by Does not apply route.   Cyanocobalamin (B-12 COMPLIANCE INJECTION IJ) Inject as directed.   diclofenac Sodium (VOLTAREN) 1 % GEL Voltaren 1 % topical gel  APPLY 2 GRAMS TO THE AFFECTED AREA(S) BY TOPICAL ROUTE 4 TIMES PER DAY   docusate sodium (COLACE) 100 MG capsule Take 100 mg by mouth daily as needed for mild constipation. Reported on 03/29/2015   DROPLET INSULIN SYRINGE 30G X 1/2" 1 ML MISC USE TO INJECT INSULIN EVERY DAY WITH BREAKFAST   furosemide (LASIX) 20 MG tablet TAKE 1 TABLET EVERY DAY AS NEEDED  (Patient taking differently: 40 mg. TAKE 2 TABLET EVERY DAY AS NEEDED)   gabapentin (NEURONTIN) 100 MG capsule Take 1 capsule (100 mg total) by mouth 3 (three) times daily.   glucose blood (TRUE METRIX BLOOD GLUCOSE TEST) test strip 1 each by Other route 4 (four) times daily.   insulin NPH-regular Human (NOVOLIN 70/30) (70-30) 100 UNIT/ML injection Inject 40 Units into the skin daily with breakfast.   losartan (COZAAR) 100 MG tablet Take 1 tablet (100 mg total) by mouth daily. Overdue follow up.  PLEASE CALL OFFICE TO SCHEDULE APPOINTMENT PRIOR TO NEXT REFILL   metoprolol succinate (TOPROL-XL) 50 MG 24 hr tablet Take 1 tablet (50 mg total) by mouth daily. Take with or immediately following a meal.   metoprolol succinate (TOPROL-XL) 50 MG 24 hr tablet Take 1 tablet (50 mg total) by mouth daily. Take with or immediately following a meal.   NOVOLOG FLEXPEN 100 UNIT/ML FlexPen Inject into the skin.   tirzepatide (MOUNJARO) 5 MG/0.5ML Pen Inject 5 mg into the skin once a week.   TOUJEO SOLOSTAR 300 UNIT/ML Solostar Pen Inject into the skin.   triamcinolone cream (KENALOG) 0.1 % APPLY TOPICALLY 4 (FOUR) TIMES DAILY AS NEEDED FOR ITCHING   TRUEplus Lancets 33G MISC TEST BLOOD SUGAR TWICE DAILY   [DISCONTINUED] Continuous Blood Gluc Sensor (DEXCOM G6 SENSOR) MISC 1 Device by Does not apply route See admin instructions. Change every 10 days   [DISCONTINUED] tirzepatide [facility]) 2.5 MG/0.5ML Pen  Inject 2.5 mg into the skin once a week.   No facility-administered encounter medications on file as of 11/07/2022.    Surgical History: Past Surgical History:  Procedure Laterality Date   ABDOMINAL HYSTERECTOMY  01/15/1995   CATARACT EXTRACTION Bilateral 01/15/2012   CHOLECYSTECTOMY  01/15/2008   COLONOSCOPY     COLONOSCOPY WITH PROPOFOL N/A 04/21/2015   Procedure: COLONOSCOPY WITH PROPOFOL;  Surgeon: Wallace Cullens, MD;  Location: [facility] ENDOSCOPY;  Service: Gastroenterology;  Laterality: N/A;   EYE SURGERY  Bilateral    Cataract   LUMBAR WOUND DEBRIDEMENT N/A 10/20/2014   Procedure: LUMBAR WOUND DEBRIDEMENT;  Surgeon: Tia Alert, MD;  Location: [facility] NEURO ORS;  Service: Neurosurgery;  Laterality: N/A;   MAXIMUM ACCESS (MAS)POSTERIOR LUMBAR INTERBODY FUSION (PLIF) 1 LEVEL N/A 09/16/2014   Procedure: L/4-5 FOR MAXIMUM ACCESS (MAS) POSTERIOR LUMBAR INTERBODY FUSION (PLIF) 1 LEVEL;  Surgeon: Tia Alert, MD;  Location: [facility] NEURO ORS;  Service: Neurosurgery;  Laterality: N/A;  L/4-5 FOR MAXIMUM ACCESS (MAS) POSTERIOR LUMBAR INTERBODY FUSION (PLIF) 1 LEVEL   REFRACTIVE SURGERY Left     Medical History: Past Medical History:  Diagnosis Date   Arthritis    Chronic kidney disease    Diabetes mellitus without complication (HCC)    Type II   Fibromyalgia    GERD (gastroesophageal reflux disease)    better after gall bladder surgery-    Hyperlipidemia    Hypertension    Shortness of breath dyspnea    with exertion    Family History: Family History  Problem Relation Age of Onset   Arthritis Mother    Cancer Mother        Ovarian   Diabetes Mother    Heart attack Father    Heart disease Father    Hypertension Father    Cancer Brother    Diabetes Brother     Social History   Socioeconomic History   Marital status: Widowed    Spouse name: Not on file   Number of children: Not on file   Years of education: Not on file   Highest education level: Not on file  Occupational History   Not on file  Tobacco Use   Smoking status: Never   Smokeless tobacco: Never  Vaping Use   Vaping status: Never Used  Substance and Sexual Activity   Alcohol use: No   Drug use: No   Sexual activity: Not Currently  Other Topics Concern   Not on file  Social History Narrative   Not on file   Social Determinants of Health   Financial Resource Strain: Not on file  Food Insecurity: Not on file  Transportation Needs: Not on file  Physical Activity: Not on file  Stress: Not on file  Social  Connections: Not on file  Intimate Partner Violence: Not on file      Review of Systems  Constitutional:  Negative for chills, fatigue and unexpected weight change.  HENT:  Positive for hearing loss and postnasal drip. Negative for congestion, rhinorrhea, sneezing and sore throat.   Eyes:  Negative for redness.  Respiratory:  Negative for cough, chest tightness and shortness of breath.   Cardiovascular:  Negative for chest pain and palpitations.  Gastrointestinal:  Negative for abdominal pain, constipation, diarrhea, nausea and vomiting.  Genitourinary:  Negative for dysuria and frequency.  Musculoskeletal:  Positive for arthralgias, back pain and gait problem. Negative for joint swelling and neck pain.  Skin:  Negative for rash.  Neurological:  Negative  for tremors.  Hematological:  Negative for adenopathy. Does not bruise/bleed easily.  Psychiatric/Behavioral:  Negative for behavioral problems (Depression), sleep disturbance and suicidal ideas. The patient is not nervous/anxious.     Vital Signs: BP 121/80   Pulse 100   Temp 97.8 F (36.6 C)   Resp 16   Ht 5\' 7"  (1.702 m)   Wt 282 lb (127.9 kg)   SpO2 93%   BMI 44.17 kg/m    Physical Exam Vitals and nursing note reviewed.  Constitutional:      Appearance: Normal appearance.  HENT:     Head: Normocephalic and atraumatic.     Nose: Nose normal.     Mouth/Throat:     Mouth: Mucous membranes are moist.     Pharynx: No posterior oropharyngeal erythema.  Eyes:     Extraocular Movements: Extraocular movements intact.     Pupils: Pupils are equal, round, and reactive to light.  Cardiovascular:     Rate and Rhythm: Normal rate and regular rhythm.     Pulses: Normal pulses.     Heart sounds: Normal heart sounds.  Pulmonary:     Effort: Pulmonary effort is normal.     Breath sounds: Normal breath sounds.  Skin:    General: Skin is warm and dry.  Neurological:     General: No focal deficit present.     Mental Status:  She is alert.  Psychiatric:        Mood and Affect: Mood normal.        Behavior: Behavior normal.        Assessment/Plan: 1. Type 2 diabetes mellitus with hyperglycemia, with long-term current use of insulin (HCC) Will increase mounjaro and reduce insulin to avoid lows. Pt has continuous monitor and keeps snacks nearby in case of low readings. - tirzepatide [facility]) 5 MG/0.5ML Pen; Inject 5 mg into the skin once a week.  Dispense: 6 mL; Refill: 2  2. Impacted cerumen of both ears May try debrox to help and will refer to ENT - Ambulatory referral to ENT  3. Hearing decreased, bilateral - Ambulatory referral to ENT  4. Morbid obesity with BMI of 40.0-44.9, adult (HCC) Will increase mounjaro to help with BG control and wt loss benefits   General Counseling: Youlanda verbalizes understanding of the findings of todays visit and agrees with plan of treatment. I have discussed any further diagnostic evaluation that may be needed or ordered today. We also reviewed her medications today. she has been encouraged to call the office with any questions or concerns that should arise related to todays visit.    Orders Placed This Encounter  Procedures   Ambulatory referral to ENT    Meds ordered this encounter  Medications   tirzepatide (MOUNJARO) 5 MG/0.5ML Pen    Sig: Inject 5 mg into the skin once a week.    Dispense:  6 mL    Refill:  2    This patient was seen by Lynn Ito, PA-C in collaboration with Dr. Beverely Risen as a part of collaborative care agreement.   Total time spent:30 Minutes Time spent includes review of chart, medications, test results, and follow up plan with the patient.      Dr Lyndon Code Internal medicine

## 2022-11-07 NOTE — Telephone Encounter (Signed)
Awaiting 11/07/22 office notes for Otolaryngology referral-Toni

## 2022-11-18 ENCOUNTER — Telehealth: Payer: Self-pay | Admitting: Physician Assistant

## 2022-11-18 DIAGNOSIS — E1165 Type 2 diabetes mellitus with hyperglycemia: Secondary | ICD-10-CM | POA: Diagnosis not present

## 2022-11-18 NOTE — Telephone Encounter (Signed)
Per Rosey Bath w/ [facility], they did not receive SS order.Faxed again, attention Kim-Toni

## 2022-11-19 ENCOUNTER — Telehealth: Payer: Self-pay | Admitting: Physician Assistant

## 2022-11-19 NOTE — Telephone Encounter (Signed)
 Otolaryngology referral sent via [facility] to [facility]. Notified patient. Gave pt telephone # [phone]

## 2022-11-20 ENCOUNTER — Telehealth: Payer: Self-pay | Admitting: Physician Assistant

## 2022-11-20 NOTE — Telephone Encounter (Signed)
Otolaryngology appointment 11/22/2022 @ [facility]-Toni

## 2022-11-22 DIAGNOSIS — H6123 Impacted cerumen, bilateral: Secondary | ICD-10-CM | POA: Diagnosis not present

## 2022-11-22 DIAGNOSIS — H903 Sensorineural hearing loss, bilateral: Secondary | ICD-10-CM | POA: Diagnosis not present

## 2022-11-29 ENCOUNTER — Telehealth: Payer: Self-pay | Admitting: Physician Assistant

## 2022-11-29 NOTE — Telephone Encounter (Signed)
SS appointment 12/05/2022 @ [facility]-Toni

## 2022-12-02 ENCOUNTER — Ambulatory Visit: Payer: Medicare HMO | Admitting: Physician Assistant

## 2022-12-05 ENCOUNTER — Encounter (INDEPENDENT_AMBULATORY_CARE_PROVIDER_SITE_OTHER): Payer: Medicare HMO | Admitting: Internal Medicine

## 2022-12-05 ENCOUNTER — Ambulatory Visit (INDEPENDENT_AMBULATORY_CARE_PROVIDER_SITE_OTHER): Payer: Medicare HMO | Admitting: Physician Assistant

## 2022-12-05 ENCOUNTER — Encounter: Payer: Self-pay | Admitting: Physician Assistant

## 2022-12-05 VITALS — BP 135/80 | HR 80 | Temp 98.0°F | Resp 16 | Ht 67.0 in | Wt 273.0 lb

## 2022-12-05 DIAGNOSIS — Z6841 Body Mass Index (BMI) 40.0 and over, adult: Secondary | ICD-10-CM

## 2022-12-05 DIAGNOSIS — G4719 Other hypersomnia: Secondary | ICD-10-CM

## 2022-12-05 DIAGNOSIS — G4733 Obstructive sleep apnea (adult) (pediatric): Secondary | ICD-10-CM | POA: Diagnosis not present

## 2022-12-05 DIAGNOSIS — Z794 Long term (current) use of insulin: Secondary | ICD-10-CM

## 2022-12-05 DIAGNOSIS — K219 Gastro-esophageal reflux disease without esophagitis: Secondary | ICD-10-CM | POA: Diagnosis not present

## 2022-12-05 DIAGNOSIS — E1165 Type 2 diabetes mellitus with hyperglycemia: Secondary | ICD-10-CM | POA: Diagnosis not present

## 2022-12-05 MED ORDER — OMEPRAZOLE 40 MG PO CPDR
40.0000 mg | DELAYED_RELEASE_CAPSULE | Freq: Every day | ORAL | 1 refills | Status: AC
Start: 2022-12-05 — End: ?

## 2022-12-05 NOTE — Progress Notes (Signed)
[facility] [address] [place], [place] [address]  Internal MEDICINE  Office Visit Note  Patient Name: Bethany [name]  [id number]  [id number]  Date of Service: 12/17/2022  Chief Complaint  Patient presents with   Follow-up   Diabetes   Gastroesophageal Reflux   Hypertension   Hyperlipidemia    HPI Pt is here for routine follow up -Pt has lost 9 lbs since last visit -on mounjaro and doing well with this, but unfortunately just heard that cost is going up. Plans to check on this as she would like to continue  -Having reflux, therefore will start omeprazole. Will continue current mounjaro dose as this could be a S/E from this and will avoid increasing at this time -Sugar has been doing better, taking about 10 units of mix at night. Going to switch timing due to some night lows -Sleep study tonight  Current Medication: Outpatient Encounter Medications as of 12/05/2022  Medication Sig   Alcohol Swabs (DROPSAFE ALCOHOL PREP) 70 % PADS USE TWICE DAILY   amLODipine (NORVASC) 2.5 MG tablet Take 1 tablet (2.5 mg total) by mouth daily.   atorvastatin (LIPITOR) 20 MG tablet TAKE 1 TABLET EVERY DAY   Blood Glucose Monitoring Suppl (TRUE METRIX METER) DEVI by Does not apply route.   Cyanocobalamin (B-12 COMPLIANCE INJECTION IJ) Inject as directed.   diclofenac Sodium (VOLTAREN) 1 % GEL Voltaren 1 % topical gel  APPLY 2 GRAMS TO THE AFFECTED AREA(S) BY TOPICAL ROUTE 4 TIMES PER DAY   docusate sodium (COLACE) 100 MG capsule Take 100 mg by mouth daily as needed for mild constipation. Reported on 03/29/2015   DROPLET INSULIN SYRINGE 30G X 1/2" 1 ML MISC USE TO INJECT INSULIN EVERY DAY WITH BREAKFAST   furosemide (LASIX) 20 MG tablet TAKE 1 TABLET EVERY DAY AS NEEDED (Patient taking differently: 40 mg. TAKE 2 TABLET EVERY DAY AS NEEDED)   gabapentin (NEURONTIN) 100 MG capsule Take 1 capsule (100 mg total) by mouth 3 (three) times daily.   glucose blood (TRUE METRIX BLOOD GLUCOSE  TEST) test strip 1 each by Other route 4 (four) times daily.   insulin NPH-regular Human (NOVOLIN 70/30) (70-30) 100 UNIT/ML injection Inject 40 Units into the skin daily with breakfast.   losartan (COZAAR) 100 MG tablet Take 1 tablet (100 mg total) by mouth daily. Overdue follow up.  PLEASE CALL OFFICE TO SCHEDULE APPOINTMENT PRIOR TO NEXT REFILL   metoprolol succinate (TOPROL-XL) 50 MG 24 hr tablet Take 1 tablet (50 mg total) by mouth daily. Take with or immediately following a meal.   metoprolol succinate (TOPROL-XL) 50 MG 24 hr tablet Take 1 tablet (50 mg total) by mouth daily. Take with or immediately following a meal.   NOVOLOG FLEXPEN 100 UNIT/ML FlexPen Inject into the skin.   omeprazole (PRILOSEC) 40 MG capsule Take 1 capsule (40 mg total) by mouth daily.   tirzepatide [facility]) 5 MG/0.5ML Pen Inject 5 mg into the skin once a week.   TOUJEO SOLOSTAR 300 UNIT/ML Solostar Pen Inject into the skin.   triamcinolone cream (KENALOG) 0.1 % APPLY TOPICALLY 4 (FOUR) TIMES DAILY AS NEEDED FOR ITCHING   TRUEplus Lancets 33G MISC TEST BLOOD SUGAR TWICE DAILY   No facility-administered encounter medications on file as of 12/05/2022.    Surgical History: Past Surgical History:  Procedure Laterality Date   ABDOMINAL HYSTERECTOMY  01/15/1995   CATARACT EXTRACTION Bilateral 01/15/2012   CHOLECYSTECTOMY  01/15/2008   COLONOSCOPY     COLONOSCOPY WITH  PROPOFOL N/A 04/21/2015   Procedure: COLONOSCOPY WITH PROPOFOL;  Surgeon: Wallace Cullens, MD;  Location: [facility] ENDOSCOPY;  Service: Gastroenterology;  Laterality: N/A;   EYE SURGERY Bilateral    Cataract   LUMBAR WOUND DEBRIDEMENT N/A 10/20/2014   Procedure: LUMBAR WOUND DEBRIDEMENT;  Surgeon: Tia Alert, MD;  Location: [facility] NEURO ORS;  Service: Neurosurgery;  Laterality: N/A;   MAXIMUM ACCESS (MAS)POSTERIOR LUMBAR INTERBODY FUSION (PLIF) 1 LEVEL N/A 09/16/2014   Procedure: L/4-5 FOR MAXIMUM ACCESS (MAS) POSTERIOR LUMBAR INTERBODY FUSION (PLIF) 1 LEVEL;   Surgeon: Tia Alert, MD;  Location: [facility] NEURO ORS;  Service: Neurosurgery;  Laterality: N/A;  L/4-5 FOR MAXIMUM ACCESS (MAS) POSTERIOR LUMBAR INTERBODY FUSION (PLIF) 1 LEVEL   REFRACTIVE SURGERY Left     Medical History: Past Medical History:  Diagnosis Date   Arthritis    Chronic kidney disease    Diabetes mellitus without complication (HCC)    Type II   Fibromyalgia    GERD (gastroesophageal reflux disease)    better after gall bladder surgery-    Hyperlipidemia    Hypertension    Shortness of breath dyspnea    with exertion    Family History: Family History  Problem Relation Age of Onset   Arthritis Mother    Cancer Mother        Ovarian   Diabetes Mother    Heart attack Father    Heart disease Father    Hypertension Father    Cancer Brother    Diabetes Brother     Social History   Socioeconomic History   Marital status: Widowed    Spouse name: Not on file   Number of children: Not on file   Years of education: Not on file   Highest education level: Not on file  Occupational History   Not on file  Tobacco Use   Smoking status: Never   Smokeless tobacco: Never  Vaping Use   Vaping status: Never Used  Substance and Sexual Activity   Alcohol use: No   Drug use: No   Sexual activity: Not Currently  Other Topics Concern   Not on file  Social History Narrative   Not on file   Social Determinants of Health   Financial Resource Strain: Not on file  Food Insecurity: Not on file  Transportation Needs: Not on file  Physical Activity: Not on file  Stress: Not on file  Social Connections: Not on file  Intimate Partner Violence: Not on file      Review of Systems  Constitutional:  Negative for chills, fatigue and unexpected weight change.  HENT:  Positive for postnasal drip. Negative for congestion, rhinorrhea, sneezing and sore throat.   Eyes:  Negative for redness.  Respiratory:  Negative for cough, chest tightness and shortness of breath.    Cardiovascular:  Negative for chest pain and palpitations.  Gastrointestinal:  Negative for abdominal pain, constipation, diarrhea, nausea and vomiting.  Genitourinary:  Negative for dysuria and frequency.  Musculoskeletal:  Positive for arthralgias, back pain and gait problem. Negative for joint swelling and neck pain.  Skin:  Negative for rash.  Neurological:  Negative for tremors.  Hematological:  Negative for adenopathy. Does not bruise/bleed easily.  Psychiatric/Behavioral:  Negative for behavioral problems (Depression), sleep disturbance and suicidal ideas. The patient is not nervous/anxious.     Vital Signs: BP 135/80   Pulse 80   Temp 98 F (36.7 C)   Resp 16   Ht 5\' 7"  (1.702 m)  Wt 273 lb (123.8 kg)   SpO2 95%   BMI 42.76 kg/m    Physical Exam Vitals and nursing note reviewed.  Constitutional:      Appearance: Normal appearance.  HENT:     Head: Normocephalic and atraumatic.     Nose: Nose normal.     Mouth/Throat:     Mouth: Mucous membranes are moist.     Pharynx: No posterior oropharyngeal erythema.  Eyes:     Extraocular Movements: Extraocular movements intact.     Pupils: Pupils are equal, round, and reactive to light.  Cardiovascular:     Rate and Rhythm: Normal rate and regular rhythm.     Pulses: Normal pulses.     Heart sounds: Normal heart sounds.  Pulmonary:     Effort: Pulmonary effort is normal.     Breath sounds: Normal breath sounds.  Skin:    General: Skin is warm and dry.  Neurological:     General: No focal deficit present.     Mental Status: She is alert.  Psychiatric:        Mood and Affect: Mood normal.        Behavior: Behavior normal.        Assessment/Plan: 1. Type 2 diabetes mellitus with hyperglycemia, with long-term current use of insulin (HCC) Sugars improving and will adjust insulin timing to avoid lows at night. Will continue mounjaro as before  2. Gastroesophageal reflux disease, unspecified whether esophagitis  present Will start on omeprazole - omeprazole (PRILOSEC) 40 MG capsule; Take 1 capsule (40 mg total) by mouth daily.  Dispense: 90 capsule; Refill: 1  3. Morbid obesity with BMI of 40.0-44.9, adult (HCC) Down 9lbs since last visit, continue to work on diet/exercise and may continue on mounjaro   General Counseling: Ren verbalizes understanding of the findings of todays visit and agrees with plan of treatment. I have discussed any further diagnostic evaluation that may be needed or ordered today. We also reviewed her medications today. she has been encouraged to call the office with any questions or concerns that should arise related to todays visit.    No orders of the defined types were placed in this encounter.   Meds ordered this encounter  Medications   omeprazole (PRILOSEC) 40 MG capsule    Sig: Take 1 capsule (40 mg total) by mouth daily.    Dispense:  90 capsule    Refill:  1    This patient was seen by Lynn Ito, PA-C in collaboration with Dr. Beverely Risen as a part of collaborative care agreement.   Total time spent:30 Minutes Time spent includes review of chart, medications, test results, and follow up plan with the patient.      Dr Lyndon Code Internal medicine

## 2022-12-25 NOTE — Progress Notes (Deleted)
[facility]  Polysomnogram Report Part I                                                               Phone: [phone] Fax: [phone]  Patient Name: Bethany [name], Bethany [name]. Acquisition Number: [id number]  Date of Birth: 07-14-1945 Acquisition Date: 12/05/2022  Referring Physician: Lynn Ito PA-C     History: The patient is a 77 year old  who was referred for re-evaluation of obstructive sleep apnea. Medical History: Arthritis, chronic kidney disease, diabetes mellitus complication, fibromyalgla, GERD, hyperlpidemla, hypertension, dyspnea.  Medications: amlodipine, atorvastatin, losartan, metoprolol succinate, NOVOLOG FLEXPIN, tirzepatide, TOUJEO SOLOSTAR, diclofenac sodium, docusate sodium, furosemide, gabapentin.  Procedure: This routine overnight polysomnogram was performed on the [facility] 5 using the standard diagnostic protocol. This included 6 channels of EEG, 2 channels of EOG, chin EMG, bilateral anterior tibialis EMG, nasal/oral thermistor, PTAF (nasal pressure transducer), chest and abdominal wall movements, EKG, and pulse oximetry.  Description: The total recording time was 404.5 minutes. The total sleep time was 282.5 minutes. There were a total of 36.5 minutes of wakefulness after sleep onset for areducedsleep efficiency of 69.8%. The latency to sleep onset was prolonged at 85.5 minutes. The R sleep onset latency was short at 56.5 minutes. Sleep parameters, as a percentage of the total sleep time, demonstrated 11.9% of sleep was in N1 sleep, 72.4% N2, 0.2% N3 and 15.6% R sleep. There were a total of 54 arousals for an arousal index of 11.5 arousals per hour of sleep that was within normal limits.  Respiratory monitoring demonstrated   snoring. All sleep was in the supine position. There were 243 apneas and hypopneas for an Apnea Hypopnea Index of 51.6 apneas and hypopneas per hour of sleep. The REM related apnea hypopnea index was 43.6/hr of REM sleep compared to a NREM AHI  of 53.1/hr.  The average duration of the respiratory events was 16.2 seconds with a maximum duration of 29.5 seconds. The respiratory events were associated with peripheral oxygen desaturations on the average to 93%. The lowest oxygen desaturation associated with a respiratory event was 50%. Additionally, the baseline oxygen saturation during wakefulness was 95%, during NREM sleep averaged 98%, and during REM sleep averaged 97%. The total duration of oxygen < 90% was 20.4 minutes and <80% was 6.8 minutes.  Cardiac monitoring-  demonstrate transient cardiac decelerations associated with the apneas.  significant cardiac rhythm irregularities.   Periodic limb movement monitoring- demonstrated that there were 79 periodic limb movements for a periodic limb movement index of 16.8 periodic limb movements per hour of sleep.   Impression: This routine overnight polysomnogram confirm the presence of severe obstructive sleep apnea with an overall Apnea Hypopnea Index of 51.6 apneas and hypopneas per hour of sleep. The respiratory events were associated with peripheral oxygen desaturations on the average to 93% with the lowest desaturation to 50%.    There was a significantly elevated periodic limb movement index of 16.8 periodic limb movements per hour of sleep. Sometimes these limb movements subside once the apnea is controlled. Clinical correlation is suggested.  reduced sleep efficiency with a prolonged sleep latency and reduced percentages of REM and slow wave sleep.  These findings would appear to be due to the combination of obstructive sleep apnea and periodic  limb movements.   Recommendations:    A CPAP titration would be recommended due to the severity of the sleep apnea. Some supine sleep should be ensured to optimize the titration. Additionally, would recommend weight loss in a patient with a BMI of 44.3.     Yevonne Pax, MD, [facility] Diplomate ABMS-Pulmonary, Critical Care and Sleep Medicine   Electronically reviewed and digitally signed   [facility] Polysomnogram Report Part II  Phone: [phone] Fax: [phone]  Patient last name Blanchette Neck Size 17.0 in. Acquisition [id number]  Patient first name Bethany [name]. Weight 283.0 lbs. Started 12/05/2022 at 10:30:11 PM  Birth date December 22, 1945 Height 67.0 in. Stopped 12/06/2022 at 5:33:05 AM  Age 77 BMI 44.3 lb/in2 Duration 404.5  Study Type Adult      Robbi Garter. RPSGT. / Loura Back   Reviewed by: Valentino Hue. Henke, PhD, ABSM, FAASM Sleep Data: Lights Out: 10:41:11 PM Sleep Onset: 12:06:41 AM  Lights On: 5:25:41 AM Sleep Efficiency: 69.8 %  Total Recording Time: 404.5 min Sleep Latency (from Lights Off) 85.5 min  Total Sleep Time (TST): 282.5 min R Latency (from Sleep Onset): 56.5 min  Sleep Period Time: 316.0 min Total number of awakenings: 14  Wake during sleep: 33.5 min Wake After Sleep Onset (WASO): 36.5 min   Sleep Data:         Arousal Summary: Stage  Latency from lights out (min) Latency from sleep onset (min) Duration (min) % Total Sleep Time  Normal values  N 1 85.5 0.0 33.5 11.9 (5%)  N 2 91.5 6.0 204.5 72.4 (50%)  N 3 131.5 46.0 0.5 0.2 (20%)  R 142.0 56.5 44.0 15.6 (25%)   Number Index  Spontaneous 41 8.7  Apneas & Hypopneas 11 2.3  RERAs 0 0.0       (Apneas & Hypopneas & RERAs)  (11) (2.3)  Limb Movement 2 0.4  Snore 0 0.0  TOTAL 54 11.5     Respiratory Data:  CA OA MA Apnea Hypopnea* A+ H RERA Total  Number 0 187 0 187 56 243 0 243  Mean Dur (sec) 0.0 16.9 0.0 16.9 13.7 16.2 0.0 16.2  Max Dur (sec) 0.0 29.5 0.0 29.5 24.5 29.5 0.0 29.5  Total Dur (min) 0.0 52.8 0.0 52.8 12.8 65.6 0.0 65.6  % of TST 0.0 18.7 0.0 18.7 4.5 23.2 0.0 23.2  Index (#/h TST) 0.0 39.7 0.0 39.7 11.9 51.6 0.0 51.6  *Hypopneas scored based on 4% or greater desaturation.  Sleep Stage:        REM NREM TST  AHI 43.6 53.1 51.6  RDI 43.6 53.1 51.6           Body Position Data:  Sleep (min) TST (%)  REM (min) NREM (min) CA (#) OA (#) MA (#) HYP (#) AHI (#/h) RERA (#) RDI (#/h) Desat (#)  Supine 282.5 100.00 44.0 238.5 0 187 0 56 51.6 0 51.6 197  Non-Supine 0.00 0.00 0.00 0.00 0.00 0.00 0.00 0.00 0.00 0 0.00 0.00     Snoring: Total number of snoring episodes  0  Total time with snoring    min (   % of sleep)   Oximetry Distribution:             WK REM NREM TOTAL  Average (%)   95 97 98 97  < 90% 17.8 1.3 1.3 20.4  < 80% 5.8 0.0 1.0 6.8  < 70% 1.6 0.0 1.0 2.6  # of  Desaturations* 7 45 145 197  Desat Index (#/hour) 3.5 61.9 36.5 41.9  Desat Max (%) 15 16 21 21   Desat Max Dur (sec) 18.0 28.0 46.0 46.0  Approx Min O2 during sleep 50  Approx min O2 during a respiratory event 50  Was Oxygen added (Y/N) and final rate :    LPM  *Desaturations based on 3% or greater drop from baseline.   Cheyne Stokes Breathing: None Present   Heart Rate Summary:  Average Heart Rate During Sleep 70.2 bpm      Highest Heart Rate During Sleep (95th %) 77.0 bpm      Highest Heart Rate During Sleep 109 bpm      Highest Heart Rate During Recording (TIB) 227 bpm (artifact)   Heart Rate Observations: Event Type # Events   Bradycardia 0 Lowest HR Scored: N/A  Sinus Tachycardia During Sleep 0 Highest HR Scored: N/A  Narrow Complex Tachycardia 0 Highest HR Scored: N/A  Wide Complex Tachycardia 0 Highest HR Scored: N/A  Asystole 0 Longest Pause: N/A  Atrial Fibrillation 0 Duration Longest Event: N/A  Other Arrythmias   Type:    Periodic Limb Movement Data: (Primary legs unless otherwise noted) Total # Limb Movement 100 Limb Movement Index 21.2  Total # PLMS 79 PLMS Index 16.8  Total # PLMS Arousals 2 PLMS Arousal Index 0.4  Percentage Sleep Time with PLMS 46.14min (16.5 % sleep)  Mean Duration limb movements (secs) 215.3

## 2022-12-25 NOTE — Procedures (Signed)
[facility]  Polysomnogram Report Part I                                                               Phone: [phone] Fax: [phone]  Patient Name: Bethany [name], Bethany [name]. Acquisition Number: [id number]  Date of Birth: 07-14-1945 Acquisition Date: 12/05/2022  Referring Physician: Lynn Ito PA-C     History: The patient is a 77 year old  who was referred for re-evaluation of obstructive sleep apnea. Medical History: Arthritis, chronic kidney disease, diabetes mellitus complication, fibromyalgla, GERD, hyperlpidemla, hypertension, dyspnea.  Medications: amlodipine, atorvastatin, losartan, metoprolol succinate, NOVOLOG FLEXPIN, tirzepatide, TOUJEO SOLOSTAR, diclofenac sodium, docusate sodium, furosemide, gabapentin.  Procedure: This routine overnight polysomnogram was performed on the [facility] 5 using the standard diagnostic protocol. This included 6 channels of EEG, 2 channels of EOG, chin EMG, bilateral anterior tibialis EMG, nasal/oral thermistor, PTAF (nasal pressure transducer), chest and abdominal wall movements, EKG, and pulse oximetry.  Description: The total recording time was 404.5 minutes. The total sleep time was 282.5 minutes. There were a total of 36.5 minutes of wakefulness after sleep onset for areducedsleep efficiency of 69.8%. The latency to sleep onset was prolonged at 85.5 minutes. The R sleep onset latency was short at 56.5 minutes. Sleep parameters, as a percentage of the total sleep time, demonstrated 11.9% of sleep was in N1 sleep, 72.4% N2, 0.2% N3 and 15.6% R sleep. There were a total of 54 arousals for an arousal index of 11.5 arousals per hour of sleep that was within normal limits.  Respiratory monitoring demonstrated   snoring. All sleep was in the supine position. There were 243 apneas and hypopneas for an Apnea Hypopnea Index of 51.6 apneas and hypopneas per hour of sleep. The REM related apnea hypopnea index was 43.6/hr of REM sleep compared to a NREM AHI  of 53.1/hr.  The average duration of the respiratory events was 16.2 seconds with a maximum duration of 29.5 seconds. The respiratory events were associated with peripheral oxygen desaturations on the average to 93%. The lowest oxygen desaturation associated with a respiratory event was 50%. Additionally, the baseline oxygen saturation during wakefulness was 95%, during NREM sleep averaged 98%, and during REM sleep averaged 97%. The total duration of oxygen < 90% was 20.4 minutes and <80% was 6.8 minutes.  Cardiac monitoring-  demonstrate transient cardiac decelerations associated with the apneas.  significant cardiac rhythm irregularities.   Periodic limb movement monitoring- demonstrated that there were 79 periodic limb movements for a periodic limb movement index of 16.8 periodic limb movements per hour of sleep.   Impression: This routine overnight polysomnogram confirm the presence of severe obstructive sleep apnea with an overall Apnea Hypopnea Index of 51.6 apneas and hypopneas per hour of sleep. The respiratory events were associated with peripheral oxygen desaturations on the average to 93% with the lowest desaturation to 50%.    There was a significantly elevated periodic limb movement index of 16.8 periodic limb movements per hour of sleep. Sometimes these limb movements subside once the apnea is controlled. Clinical correlation is suggested.  reduced sleep efficiency with a prolonged sleep latency and reduced percentages of REM and slow wave sleep.  These findings would appear to be due to the combination of obstructive sleep apnea and periodic  limb movements.   Recommendations:    A CPAP titration would be recommended due to the severity of the sleep apnea. Some supine sleep should be ensured to optimize the titration. Additionally, would recommend weight loss in a patient with a BMI of 44.3.     Yevonne Pax, MD, [facility] Diplomate ABMS-Pulmonary, Critical Care and Sleep Medicine   Electronically reviewed and digitally signed   [facility] Polysomnogram Report Part II  Phone: [phone] Fax: [phone]  Patient last name Blanchette Neck Size 17.0 in. Acquisition [id number]  Patient first name Bethany [name]. Weight 283.0 lbs. Started 12/05/2022 at 10:30:11 PM  Birth date December 22, 1945 Height 67.0 in. Stopped 12/06/2022 at 5:33:05 AM  Age 82 BMI 44.3 lb/in2 Duration 404.5  Study Type Adult      Robbi Garter. RPSGT. / Loura Back   Reviewed by: Valentino Hue. Henke, PhD, ABSM, FAASM Sleep Data: Lights Out: 10:41:11 PM Sleep Onset: 12:06:41 AM  Lights On: 5:25:41 AM Sleep Efficiency: 69.8 %  Total Recording Time: 404.5 min Sleep Latency (from Lights Off) 85.5 min  Total Sleep Time (TST): 282.5 min R Latency (from Sleep Onset): 56.5 min  Sleep Period Time: 316.0 min Total number of awakenings: 14  Wake during sleep: 33.5 min Wake After Sleep Onset (WASO): 36.5 min   Sleep Data:         Arousal Summary: Stage  Latency from lights out (min) Latency from sleep onset (min) Duration (min) % Total Sleep Time  Normal values  N 1 85.5 0.0 33.5 11.9 (5%)  N 2 91.5 6.0 204.5 72.4 (50%)  N 3 131.5 46.0 0.5 0.2 (20%)  R 142.0 56.5 44.0 15.6 (25%)   Number Index  Spontaneous 41 8.7  Apneas & Hypopneas 11 2.3  RERAs 0 0.0       (Apneas & Hypopneas & RERAs)  (11) (2.3)  Limb Movement 2 0.4  Snore 0 0.0  TOTAL 54 11.5     Respiratory Data:  CA OA MA Apnea Hypopnea* A+ H RERA Total  Number 0 187 0 187 56 243 0 243  Mean Dur (sec) 0.0 16.9 0.0 16.9 13.7 16.2 0.0 16.2  Max Dur (sec) 0.0 29.5 0.0 29.5 24.5 29.5 0.0 29.5  Total Dur (min) 0.0 52.8 0.0 52.8 12.8 65.6 0.0 65.6  % of TST 0.0 18.7 0.0 18.7 4.5 23.2 0.0 23.2  Index (#/h TST) 0.0 39.7 0.0 39.7 11.9 51.6 0.0 51.6  *Hypopneas scored based on 4% or greater desaturation.  Sleep Stage:        REM NREM TST  AHI 43.6 53.1 51.6  RDI 43.6 53.1 51.6           Body Position Data:  Sleep (min) TST (%)  REM (min) NREM (min) CA (#) OA (#) MA (#) HYP (#) AHI (#/h) RERA (#) RDI (#/h) Desat (#)  Supine 282.5 100.00 44.0 238.5 0 187 0 56 51.6 0 51.6 197  Non-Supine 0.00 0.00 0.00 0.00 0.00 0.00 0.00 0.00 0.00 0 0.00 0.00     Snoring: Total number of snoring episodes  0  Total time with snoring    min (   % of sleep)   Oximetry Distribution:             WK REM NREM TOTAL  Average (%)   95 97 98 97  < 90% 17.8 1.3 1.3 20.4  < 80% 5.8 0.0 1.0 6.8  < 70% 1.6 0.0 1.0 2.6  # of  Desaturations* 7 45 145 197  Desat Index (#/hour) 3.5 61.9 36.5 41.9  Desat Max (%) 15 16 21 21   Desat Max Dur (sec) 18.0 28.0 46.0 46.0  Approx Min O2 during sleep 50  Approx min O2 during a respiratory event 50  Was Oxygen added (Y/N) and final rate :    LPM  *Desaturations based on 3% or greater drop from baseline.   Cheyne Stokes Breathing: None Present   Heart Rate Summary:  Average Heart Rate During Sleep 70.2 bpm      Highest Heart Rate During Sleep (95th %) 77.0 bpm      Highest Heart Rate During Sleep 109 bpm      Highest Heart Rate During Recording (TIB) 227 bpm (artifact)   Heart Rate Observations: Event Type # Events   Bradycardia 0 Lowest HR Scored: N/A  Sinus Tachycardia During Sleep 0 Highest HR Scored: N/A  Narrow Complex Tachycardia 0 Highest HR Scored: N/A  Wide Complex Tachycardia 0 Highest HR Scored: N/A  Asystole 0 Longest Pause: N/A  Atrial Fibrillation 0 Duration Longest Event: N/A  Other Arrythmias   Type:    Periodic Limb Movement Data: (Primary legs unless otherwise noted) Total # Limb Movement 100 Limb Movement Index 21.2  Total # PLMS 79 PLMS Index 16.8  Total # PLMS Arousals 2 PLMS Arousal Index 0.4  Percentage Sleep Time with PLMS 46.14min (16.5 % sleep)  Mean Duration limb movements (secs) 215.3

## 2023-01-02 ENCOUNTER — Ambulatory Visit (INDEPENDENT_AMBULATORY_CARE_PROVIDER_SITE_OTHER): Payer: Medicare HMO | Admitting: Physician Assistant

## 2023-01-02 ENCOUNTER — Encounter: Payer: Self-pay | Admitting: Physician Assistant

## 2023-01-02 VITALS — BP 156/76 | HR 88 | Temp 98.5°F | Resp 16 | Ht 67.0 in | Wt 279.2 lb

## 2023-01-02 DIAGNOSIS — G4733 Obstructive sleep apnea (adult) (pediatric): Secondary | ICD-10-CM | POA: Diagnosis not present

## 2023-01-02 DIAGNOSIS — Z6841 Body Mass Index (BMI) 40.0 and over, adult: Secondary | ICD-10-CM | POA: Diagnosis not present

## 2023-01-02 DIAGNOSIS — Z794 Long term (current) use of insulin: Secondary | ICD-10-CM

## 2023-01-02 DIAGNOSIS — E1165 Type 2 diabetes mellitus with hyperglycemia: Secondary | ICD-10-CM

## 2023-01-02 LAB — POCT GLYCOSYLATED HEMOGLOBIN (HGB A1C): Hemoglobin A1C: 8 % — AB (ref 4.0–5.6)

## 2023-01-02 NOTE — Progress Notes (Signed)
[facility] [address] [place], [place] [address]  Internal MEDICINE  Office Visit Note  Patient Name: Bethany [name]  [id number]  [id number]  Date of Service: 01/02/2023  Chief Complaint  Patient presents with   Follow-up    Review sleep study   Diabetes   Gastroesophageal Reflux   Hypertension   Hyperlipidemia    HPI Pt is here for routine follow up -Got sick last week, had taken 5mg  mounjaro shot Saturday, but then got lightheaded and nausea on Tuesday and started vomiting. States she had not eaten that day. Has had very low appetite on increased dose and not eating regularly.  -Mix at 10units most days but then would skip days if BG low recently. -Discussed holding mounjaro and considering going back down to 2.5mg  weekly dose since better tolerated. Pt will increase mix depending on sugars off mounjaro -will hold on script until new year for going back to 2.5mg  mounjaro via [facility] -SS still shows severe OSA, pt claims she did not sleep well. Still slept about 70%. Will need titration prior to restarting on PAP therapy. She would like to try nasal pillow option this time to limit gear on her face. Denies sleeping with mouth open -BP still high on recheck at 156/76-- has not taken meds yet as she has not eaten and will do so now -is drinking some Glucerna shakes  Current Medication: Outpatient Encounter Medications as of 01/02/2023  Medication Sig   Alcohol Swabs (DROPSAFE ALCOHOL PREP) 70 % PADS USE TWICE DAILY   amLODipine (NORVASC) 2.5 MG tablet Take 1 tablet (2.5 mg total) by mouth daily.   atorvastatin (LIPITOR) 20 MG tablet TAKE 1 TABLET EVERY DAY   Blood Glucose Monitoring Suppl (TRUE METRIX METER) DEVI by Does not apply route.   Cyanocobalamin (B-12 COMPLIANCE INJECTION IJ) Inject as directed.   diclofenac Sodium (VOLTAREN) 1 % GEL Voltaren 1 % topical gel  APPLY 2 GRAMS TO THE AFFECTED AREA(S) BY TOPICAL ROUTE 4 TIMES PER DAY   docusate sodium  (COLACE) 100 MG capsule Take 100 mg by mouth daily as needed for mild constipation. Reported on 03/29/2015   DROPLET INSULIN SYRINGE 30G X 1/2" 1 ML MISC USE TO INJECT INSULIN EVERY DAY WITH BREAKFAST   furosemide (LASIX) 20 MG tablet TAKE 1 TABLET EVERY DAY AS NEEDED (Patient taking differently: 40 mg. TAKE 2 TABLET EVERY DAY AS NEEDED)   gabapentin (NEURONTIN) 100 MG capsule Take 1 capsule (100 mg total) by mouth 3 (three) times daily.   glucose blood (TRUE METRIX BLOOD GLUCOSE TEST) test strip 1 each by Other route 4 (four) times daily.   insulin NPH-regular Human (NOVOLIN 70/30) (70-30) 100 UNIT/ML injection Inject 40 Units into the skin daily with breakfast.   losartan (COZAAR) 100 MG tablet Take 1 tablet (100 mg total) by mouth daily. Overdue follow up.  PLEASE CALL OFFICE TO SCHEDULE APPOINTMENT PRIOR TO NEXT REFILL   metoprolol succinate (TOPROL-XL) 50 MG 24 hr tablet Take 1 tablet (50 mg total) by mouth daily. Take with or immediately following a meal.   metoprolol succinate (TOPROL-XL) 50 MG 24 hr tablet Take 1 tablet (50 mg total) by mouth daily. Take with or immediately following a meal.   NOVOLOG FLEXPEN 100 UNIT/ML FlexPen Inject into the skin.   omeprazole (PRILOSEC) 40 MG capsule Take 1 capsule (40 mg total) by mouth daily.   tirzepatide [facility]) 5 MG/0.5ML Pen Inject 5 mg into the skin once a week.   TOUJEO  SOLOSTAR 300 UNIT/ML Solostar Pen Inject into the skin.   triamcinolone cream (KENALOG) 0.1 % APPLY TOPICALLY 4 (FOUR) TIMES DAILY AS NEEDED FOR ITCHING   TRUEplus Lancets 33G MISC TEST BLOOD SUGAR TWICE DAILY   No facility-administered encounter medications on file as of 01/02/2023.    Surgical History: Past Surgical History:  Procedure Laterality Date   ABDOMINAL HYSTERECTOMY  01/15/1995   CATARACT EXTRACTION Bilateral 01/15/2012   CHOLECYSTECTOMY  01/15/2008   COLONOSCOPY     COLONOSCOPY WITH PROPOFOL N/A 04/21/2015   Procedure: COLONOSCOPY WITH PROPOFOL;  Surgeon:  Wallace Cullens, MD;  Location: [facility] ENDOSCOPY;  Service: Gastroenterology;  Laterality: N/A;   EYE SURGERY Bilateral    Cataract   LUMBAR WOUND DEBRIDEMENT N/A 10/20/2014   Procedure: LUMBAR WOUND DEBRIDEMENT;  Surgeon: Tia Alert, MD;  Location: [facility] NEURO ORS;  Service: Neurosurgery;  Laterality: N/A;   MAXIMUM ACCESS (MAS)POSTERIOR LUMBAR INTERBODY FUSION (PLIF) 1 LEVEL N/A 09/16/2014   Procedure: L/4-5 FOR MAXIMUM ACCESS (MAS) POSTERIOR LUMBAR INTERBODY FUSION (PLIF) 1 LEVEL;  Surgeon: Tia Alert, MD;  Location: [facility] NEURO ORS;  Service: Neurosurgery;  Laterality: N/A;  L/4-5 FOR MAXIMUM ACCESS (MAS) POSTERIOR LUMBAR INTERBODY FUSION (PLIF) 1 LEVEL   REFRACTIVE SURGERY Left     Medical History: Past Medical History:  Diagnosis Date   Arthritis    Chronic kidney disease    Diabetes mellitus without complication (HCC)    Type II   Fibromyalgia    GERD (gastroesophageal reflux disease)    better after gall bladder surgery-    Hyperlipidemia    Hypertension    Shortness of breath dyspnea    with exertion    Family History: Family History  Problem Relation Age of Onset   Arthritis Mother    Cancer Mother        Ovarian   Diabetes Mother    Heart attack Father    Heart disease Father    Hypertension Father    Cancer Brother    Diabetes Brother     Social History   Socioeconomic History   Marital status: Widowed    Spouse name: Not on file   Number of children: Not on file   Years of education: Not on file   Highest education level: Not on file  Occupational History   Not on file  Tobacco Use   Smoking status: Never   Smokeless tobacco: Never  Vaping Use   Vaping status: Never Used  Substance and Sexual Activity   Alcohol use: No   Drug use: No   Sexual activity: Not Currently  Other Topics Concern   Not on file  Social History Narrative   Not on file   Social Drivers of Health   Financial Resource Strain: Not on file  Food Insecurity: Not on file   Transportation Needs: Not on file  Physical Activity: Not on file  Stress: Not on file  Social Connections: Not on file  Intimate Partner Violence: Not on file      Review of Systems  Constitutional:  Negative for chills, fatigue and unexpected weight change.  HENT:  Positive for postnasal drip. Negative for congestion, rhinorrhea, sneezing and sore throat.   Eyes:  Negative for redness.  Respiratory:  Negative for cough, chest tightness and shortness of breath.   Cardiovascular:  Negative for chest pain and palpitations.  Gastrointestinal:  Negative for abdominal pain, constipation, diarrhea, nausea and vomiting.  Genitourinary:  Negative for dysuria and frequency.  Musculoskeletal:  Positive for arthralgias, back pain and gait problem. Negative for joint swelling and neck pain.  Skin:  Negative for rash.  Neurological:  Negative for tremors.  Hematological:  Negative for adenopathy. Does not bruise/bleed easily.  Psychiatric/Behavioral:  Negative for behavioral problems (Depression), sleep disturbance and suicidal ideas. The patient is not nervous/anxious.     Vital Signs: BP (!) 161/80   Pulse 88   Temp 98.5 F (36.9 C)   Resp 16   Ht 5\' 7"  (1.702 m)   Wt 279 lb 3.2 oz (126.6 kg)   SpO2 98%   BMI 43.73 kg/m    Physical Exam Vitals and nursing note reviewed.  Constitutional:      Appearance: Normal appearance.  HENT:     Head: Normocephalic and atraumatic.     Nose: Nose normal.     Mouth/Throat:     Mouth: Mucous membranes are moist.     Pharynx: No posterior oropharyngeal erythema.  Eyes:     Extraocular Movements: Extraocular movements intact.     Pupils: Pupils are equal, round, and reactive to light.  Cardiovascular:     Rate and Rhythm: Normal rate and regular rhythm.     Pulses: Normal pulses.     Heart sounds: Normal heart sounds.  Pulmonary:     Effort: Pulmonary effort is normal.     Breath sounds: Normal breath sounds.  Skin:    General: Skin  is warm and dry.  Neurological:     General: No focal deficit present.     Mental Status: She is alert.  Psychiatric:        Mood and Affect: Mood normal.        Behavior: Behavior normal.        Assessment/Plan: 1. Type 2 diabetes mellitus with hyperglycemia, with long-term current use of insulin (HCC) (Primary) - POCT HgB A1C is 8 which is signifcantly improved from 10.5 last visit. Will need to decrease mounjaro due to S/E on higher dose. Advised on eating regular meals and adjusting insulin mix with change in mounjaro. Would like to hold a few weeks and restart on low dose in new year to see cost. Monitor BG closely  2. OSA (obstructive sleep apnea) Will need titration study to move forward with PAP therapy for severe OSA  3. Morbid obesity with BMI of 40.0-44.9, adult (HCC) Obesity Counseling: Had a lengthy discussion regarding patients BMI and weight issues. Patient was instructed on portion control as well as increased activity. Also discussed caloric restrictions with trying to maintain intake less than 2000 Kcal. Discussions were made in accordance with the 5As of weight management. Simple actions such as not eating late and if able to, taking a walk is suggested.    General Counseling: Brenleigh verbalizes understanding of the findings of todays visit and agrees with plan of treatment. I have discussed any further diagnostic evaluation that may be needed or ordered today. We also reviewed her medications today. she has been encouraged to call the office with any questions or concerns that should arise related to todays visit.    Orders Placed This Encounter  Procedures   POCT HgB A1C    No orders of the defined types were placed in this encounter.   This patient was seen by Lynn Ito, PA-C in collaboration with Dr. Beverely Risen as a part of collaborative care agreement.   Total time spent:30 Minutes Time spent includes review of chart, medications, test results, and  follow up plan with  the patient.      Dr Lyndon Code Internal medicine

## 2023-01-05 ENCOUNTER — Other Ambulatory Visit: Payer: Self-pay | Admitting: Physician Assistant

## 2023-01-05 DIAGNOSIS — I10 Essential (primary) hypertension: Secondary | ICD-10-CM

## 2023-01-16 ENCOUNTER — Other Ambulatory Visit: Payer: Self-pay | Admitting: Physician Assistant

## 2023-01-16 DIAGNOSIS — Z794 Long term (current) use of insulin: Secondary | ICD-10-CM

## 2023-01-20 ENCOUNTER — Telehealth: Payer: Self-pay | Admitting: Physician Assistant

## 2023-01-20 ENCOUNTER — Telehealth: Payer: Self-pay

## 2023-01-20 NOTE — Telephone Encounter (Signed)
 Per [facility], they are unable to reach patient to schedule SS. I called patient, gave her their telephone #-Toni

## 2023-01-22 ENCOUNTER — Other Ambulatory Visit: Payer: Self-pay

## 2023-01-22 MED ORDER — TIRZEPATIDE 2.5 MG/0.5ML ~~LOC~~ SOAJ: 2.5000 mg | SUBCUTANEOUS | 3 refills | Status: DC

## 2023-01-22 MED ORDER — NOVOLIN 70/30 (70-30) 100 UNIT/ML ~~LOC~~ SUSP: 40.0000 [IU] | Freq: Every day | SUBCUTANEOUS | 2 refills | Status: DC

## 2023-01-22 NOTE — Telephone Encounter (Signed)
 Done

## 2023-02-06 ENCOUNTER — Ambulatory Visit (INDEPENDENT_AMBULATORY_CARE_PROVIDER_SITE_OTHER): Payer: Medicare HMO | Admitting: Physician Assistant

## 2023-02-06 ENCOUNTER — Encounter: Payer: Self-pay | Admitting: Physician Assistant

## 2023-02-06 VITALS — BP 125/71 | HR 72 | Temp 98.2°F | Resp 16 | Ht 67.0 in | Wt 274.0 lb

## 2023-02-06 DIAGNOSIS — E1165 Type 2 diabetes mellitus with hyperglycemia: Secondary | ICD-10-CM | POA: Diagnosis not present

## 2023-02-06 DIAGNOSIS — Z794 Long term (current) use of insulin: Secondary | ICD-10-CM | POA: Diagnosis not present

## 2023-02-06 DIAGNOSIS — G4733 Obstructive sleep apnea (adult) (pediatric): Secondary | ICD-10-CM | POA: Diagnosis not present

## 2023-02-06 DIAGNOSIS — K219 Gastro-esophageal reflux disease without esophagitis: Secondary | ICD-10-CM

## 2023-02-06 MED ORDER — NOVOLIN 70/30 (70-30) 100 UNIT/ML ~~LOC~~ SUSP
SUBCUTANEOUS | 2 refills | Status: DC
Start: 2023-02-06 — End: 2023-08-25

## 2023-02-06 MED ORDER — TIRZEPATIDE 2.5 MG/0.5ML ~~LOC~~ SOAJ: 2.5000 mg | SUBCUTANEOUS | 3 refills | Status: DC

## 2023-02-06 NOTE — Progress Notes (Signed)
[facility] [address] [place], [place] [address]  Internal MEDICINE  Office Visit Note  Patient Name: Bethany [name]  [id number]  [id number]  Date of Service: 02/06/2023  Chief Complaint  Patient presents with   Follow-up   Diabetes   Gastroesophageal Reflux   Hypertension   Hyperlipidemia   Medication Management    Review insulin regimen    HPI Pt is here for routine follow up -BG this morning 190, 146 at 4 AM, forgot meter to see other numbers, States overall has been ok recently -Appetite still off from last 5mg  mounjaro injection 2 weeks ago -Needs simplified insulin regimen, unclear what she has been taking recently due to running out of mix and has not received from [facility] still. Will try resending. Has been using one of her pens, but unclear if toujeo or novolog. -Will switch to just mix once per day at lunch, until appetite improved, then may switch to BID with meals as long as she is eating breakfast. -Gets indigestion, does get some right arm pain with cane as well. Denies CP. Thinks may be related to higher mounjaro dose causing GI upset and now feels like she has gas. Discussed possible miralax to help. Advised if any CP to call cardiology or go to ED. -goes back to vascular next week, still hasn't gotten pump for lymphedema and will discuss  -has not scheduled titration study, states difficulty sleeping in lab and was hesitant, but will move forward now  Current Medication: Outpatient Encounter Medications as of 02/06/2023  Medication Sig   Alcohol Swabs (DROPSAFE ALCOHOL PREP) 70 % PADS USE TWICE DAILY   amLODipine (NORVASC) 2.5 MG tablet TAKE 1 TABLET EVERY DAY   atorvastatin (LIPITOR) 20 MG tablet TAKE 1 TABLET EVERY DAY   Blood Glucose Monitoring Suppl (TRUE METRIX METER) DEVI by Does not apply route.   Cyanocobalamin (B-12 COMPLIANCE INJECTION IJ) Inject as directed.   diclofenac Sodium (VOLTAREN) 1 % GEL Voltaren 1 % topical gel   APPLY 2 GRAMS TO THE AFFECTED AREA(S) BY TOPICAL ROUTE 4 TIMES PER DAY   docusate sodium (COLACE) 100 MG capsule Take 100 mg by mouth daily as needed for mild constipation. Reported on 03/29/2015   DROPLET INSULIN SYRINGE 30G X 1/2" 1 ML MISC USE TO INJECT INSULIN EVERY DAY WITH BREAKFAST   furosemide (LASIX) 20 MG tablet TAKE 1 TABLET EVERY DAY AS NEEDED (Patient taking differently: 40 mg. TAKE 2 TABLET EVERY DAY AS NEEDED)   gabapentin (NEURONTIN) 100 MG capsule Take 1 capsule (100 mg total) by mouth 3 (three) times daily.   glucose blood (TRUE METRIX BLOOD GLUCOSE TEST) test strip 1 each by Other route 4 (four) times daily.   losartan (COZAAR) 100 MG tablet Take 1 tablet (100 mg total) by mouth daily. Overdue follow up.  PLEASE CALL OFFICE TO SCHEDULE APPOINTMENT PRIOR TO NEXT REFILL   metoprolol succinate (TOPROL-XL) 50 MG 24 hr tablet Take 1 tablet (50 mg total) by mouth daily. Take with or immediately following a meal.   metoprolol succinate (TOPROL-XL) 50 MG 24 hr tablet Take 1 tablet (50 mg total) by mouth daily. Take with or immediately following a meal.   omeprazole (PRILOSEC) 40 MG capsule Take 1 capsule (40 mg total) by mouth daily.   triamcinolone cream (KENALOG) 0.1 % APPLY TOPICALLY 4 (FOUR) TIMES DAILY AS NEEDED FOR ITCHING   TRUEplus Lancets 33G MISC TEST BLOOD SUGAR TWICE DAILY   [DISCONTINUED] insulin NPH-regular Human (NOVOLIN 70/30) (  70-30) 100 UNIT/ML injection Inject 40 Units into the skin daily with breakfast.   [DISCONTINUED] NOVOLOG FLEXPEN 100 UNIT/ML FlexPen Inject into the skin.   [DISCONTINUED] tirzepatide [facility]) 2.5 MG/0.5ML Pen Inject 2.5 mg into the skin once a week.   [DISCONTINUED] TOUJEO SOLOSTAR 300 UNIT/ML Solostar Pen Inject into the skin.   insulin NPH-regular Human (NOVOLIN 70/30) (70-30) 100 UNIT/ML injection Take 20units with biggest meal of the day   tirzepatide [facility]) 2.5 MG/0.5ML Pen Inject 2.5 mg into the skin once a week.   No  facility-administered encounter medications on file as of 02/06/2023.    Surgical History: Past Surgical History:  Procedure Laterality Date   ABDOMINAL HYSTERECTOMY  01/15/1995   CATARACT EXTRACTION Bilateral 01/15/2012   CHOLECYSTECTOMY  01/15/2008   COLONOSCOPY     COLONOSCOPY WITH PROPOFOL N/A 04/21/2015   Procedure: COLONOSCOPY WITH PROPOFOL;  Surgeon: Wallace Cullens, MD;  Location: [facility] ENDOSCOPY;  Service: Gastroenterology;  Laterality: N/A;   EYE SURGERY Bilateral    Cataract   LUMBAR WOUND DEBRIDEMENT N/A 10/20/2014   Procedure: LUMBAR WOUND DEBRIDEMENT;  Surgeon: Tia Alert, MD;  Location: [facility];  Service: Neurosurgery;  Laterality: N/A;   MAXIMUM ACCESS (MAS)POSTERIOR LUMBAR INTERBODY FUSION (PLIF) 1 LEVEL N/A 09/16/2014   Procedure: L/4-5 FOR MAXIMUM ACCESS (MAS) POSTERIOR LUMBAR INTERBODY FUSION (PLIF) 1 LEVEL;  Surgeon: Tia Alert, MD;  Location: [facility];  Service: Neurosurgery;  Laterality: N/A;  L/4-5 FOR MAXIMUM ACCESS (MAS) POSTERIOR LUMBAR INTERBODY FUSION (PLIF) 1 LEVEL   REFRACTIVE SURGERY Left     Medical History: Past Medical History:  Diagnosis Date   Arthritis    Chronic kidney disease    Diabetes mellitus without complication (HCC)    Type II   Fibromyalgia    GERD (gastroesophageal reflux disease)    better after gall bladder surgery-    Hyperlipidemia    Hypertension    Shortness of breath dyspnea    with exertion    Family History: Family History  Problem Relation Age of Onset   Arthritis Mother    Cancer Mother        Ovarian   Diabetes Mother    Heart attack Father    Heart disease Father    Hypertension Father    Cancer Brother    Diabetes Brother     Social History   Socioeconomic History   Marital status: Widowed    Spouse name: Not on file   Number of children: Not on file   Years of education: Not on file   Highest education level: Not on file  Occupational History   Not on file  Tobacco Use   Smoking  status: Never   Smokeless tobacco: Never  Vaping Use   Vaping status: Never Used  Substance and Sexual Activity   Alcohol use: No   Drug use: No   Sexual activity: Not Currently  Other Topics Concern   Not on file  Social History Narrative   Not on file   Social Drivers of Health   Financial Resource Strain: Not on file  Food Insecurity: Not on file  Transportation Needs: Not on file  Physical Activity: Not on file  Stress: Not on file  Social Connections: Not on file  Intimate Partner Violence: Not on file      Review of Systems  Constitutional:  Negative for chills, fatigue and unexpected weight change.  HENT:  Positive for postnasal drip. Negative for congestion, rhinorrhea, sneezing and sore  throat.   Eyes:  Negative for redness.  Respiratory:  Negative for cough, chest tightness and shortness of breath.   Cardiovascular:  Negative for chest pain and palpitations.  Gastrointestinal:  Positive for constipation. Negative for abdominal pain, diarrhea, nausea and vomiting.  Genitourinary:  Negative for dysuria and frequency.  Musculoskeletal:  Positive for arthralgias, back pain and gait problem. Negative for joint swelling and neck pain.  Skin:  Negative for rash.  Neurological:  Negative for tremors.  Hematological:  Negative for adenopathy. Does not bruise/bleed easily.  Psychiatric/Behavioral:  Negative for behavioral problems (Depression), sleep disturbance and suicidal ideas. The patient is not nervous/anxious.     Vital Signs: BP 125/71   Pulse 72   Temp 98.2 F (36.8 C)   Resp 16   Ht 5\' 7"  (1.702 m)   Wt 274 lb (124.3 kg)   SpO2 99%   BMI 42.91 kg/m    Physical Exam Vitals and nursing note reviewed.  Constitutional:      Appearance: Normal appearance.  HENT:     Head: Normocephalic and atraumatic.     Nose: Nose normal.     Mouth/Throat:     Mouth: Mucous membranes are moist.     Pharynx: No posterior oropharyngeal erythema.  Eyes:      Extraocular Movements: Extraocular movements intact.     Pupils: Pupils are equal, round, and reactive to light.  Cardiovascular:     Rate and Rhythm: Normal rate and regular rhythm.     Pulses: Normal pulses.     Heart sounds: Normal heart sounds.  Pulmonary:     Effort: Pulmonary effort is normal.     Breath sounds: Normal breath sounds.  Skin:    General: Skin is warm and dry.  Neurological:     General: No focal deficit present.     Mental Status: She is alert.  Psychiatric:        Mood and Affect: Mood normal.        Behavior: Behavior normal.        Assessment/Plan: 1. Type 2 diabetes mellitus with hyperglycemia, with long-term current use of insulin (HCC) (Primary) Will resend lower dose mounjaro to start once feeling better as this dose was well tolerated previously. Will witch to just taking mix daily and d/c novalog and toujeo for better med compliance and simplicity. Will need to adjust dose accordingly. May need BID once eating breakfast again, but for now will plan with midday meal. - tirzepatide (MOUNJARO) 2.5 MG/0.5ML Pen; Inject 2.5 mg into the skin once a week.  Dispense: 6 mL; Refill: 3 - insulin NPH-regular Human (NOVOLIN 70/30) (70-30) 100 UNIT/ML injection; Take 20units with biggest meal of the day  Dispense: 30 mL; Refill: 2  2. OSA (obstructive sleep apnea) Will schedule titration study  3. Gastroesophageal reflux disease, unspecified whether esophagitis present Continue prilosec, may try gasx or miralax for recent gas that is making reflux worse recently. D/c 5mg  mounjaro which should help.    General Counseling: Aaniya verbalizes understanding of the findings of todays visit and agrees with plan of treatment. I have discussed any further diagnostic evaluation that may be needed or ordered today. We also reviewed her medications today. she has been encouraged to call the office with any questions or concerns that should arise related to todays  visit.    No orders of the defined types were placed in this encounter.   Meds ordered this encounter  Medications   tirzepatide [facility]) 2.5 MG/0.5ML  Pen    Sig: Inject 2.5 mg into the skin once a week.    Dispense:  6 mL    Refill:  3   insulin NPH-regular Human (NOVOLIN 70/30) (70-30) 100 UNIT/ML injection    Sig: Take 20units with biggest meal of the day    Dispense:  30 mL    Refill:  2    This patient was seen by Lynn Ito, PA-C in collaboration with Dr. Beverely Risen as a part of collaborative care agreement.   Total time spent:30 Minutes Time spent includes review of chart, medications, test results, and follow up plan with the patient.      Dr Lyndon Code Internal medicine

## 2023-02-13 ENCOUNTER — Telehealth: Payer: Self-pay

## 2023-02-13 ENCOUNTER — Encounter (INDEPENDENT_AMBULATORY_CARE_PROVIDER_SITE_OTHER): Payer: Self-pay | Admitting: Nurse Practitioner

## 2023-02-13 ENCOUNTER — Ambulatory Visit (INDEPENDENT_AMBULATORY_CARE_PROVIDER_SITE_OTHER): Payer: Medicare HMO | Admitting: Nurse Practitioner

## 2023-02-13 VITALS — BP 185/80 | HR 83 | Resp 18 | Ht 68.0 in | Wt 274.4 lb

## 2023-02-13 DIAGNOSIS — N183 Chronic kidney disease, stage 3 unspecified: Secondary | ICD-10-CM

## 2023-02-13 DIAGNOSIS — N1831 Chronic kidney disease, stage 3a: Secondary | ICD-10-CM

## 2023-02-13 DIAGNOSIS — Z794 Long term (current) use of insulin: Secondary | ICD-10-CM | POA: Diagnosis not present

## 2023-02-13 DIAGNOSIS — I5032 Chronic diastolic (congestive) heart failure: Secondary | ICD-10-CM

## 2023-02-13 DIAGNOSIS — I890 Lymphedema, not elsewhere classified: Secondary | ICD-10-CM

## 2023-02-13 DIAGNOSIS — E1122 Type 2 diabetes mellitus with diabetic chronic kidney disease: Secondary | ICD-10-CM | POA: Diagnosis not present

## 2023-02-13 NOTE — Progress Notes (Signed)
   02/13/2023  Patient ID: Bethany [name], female   DOB: [birth year], 78 y.o.   MRN: [id number]  Contacted patient regarding referral for medication management from Carlean Jews, PA-C .   Appointment scheduled : 02/14/2023 @ 11: 00 AM   Cephus Shelling, PharmD Clinical Pharmacist Cell: [phone]

## 2023-02-14 ENCOUNTER — Other Ambulatory Visit: Payer: Self-pay

## 2023-02-14 DIAGNOSIS — Z79899 Other long term (current) drug therapy: Secondary | ICD-10-CM

## 2023-02-14 NOTE — Progress Notes (Signed)
Subjective:    Patient ID: Bethany [name], female    DOB: [birth year], 78 y.o.   MRN: [id number] Chief Complaint  Patient presents with   Follow-up    6 month follow up with no studies    Bethany [name] is a 78 year old female who returns today for follow-up evaluation of her lymphedema.  Several months ago she was in [employer] and her swelling has maintained since she has been removed from them.  There has been no development of any large open blisters or ulcerations.  She was recently fit for lymphedema pump but has not received it.  Her swelling actually does appear improved from her previous visit although some hyperpigmentation is still noted.    Review of Systems  Cardiovascular:  Positive for leg swelling.  Musculoskeletal:  Positive for arthralgias.  All other systems reviewed and are negative.      Objective:   Physical Exam Vitals reviewed.  HENT:     Head: Normocephalic.  Cardiovascular:     Rate and Rhythm: Normal rate.  Pulmonary:     Effort: Pulmonary effort is normal.  Skin:    General: Skin is warm and dry.     Comments: Hyperpigmentation  Neurological:     Mental Status: She is alert and oriented to person, place, and time.  Psychiatric:        Mood and Affect: Mood normal.        Behavior: Behavior normal.        Thought Content: Thought content normal.        Judgment: Judgment normal.     BP (!) 185/80   Pulse 83   Resp 18   Ht 5\' 8"  (1.727 m)   Wt 274 lb 6.4 oz (124.5 kg)   BMI 41.72 kg/m   Past Medical History:  Diagnosis Date   Arthritis    Chronic kidney disease    Diabetes mellitus without complication (HCC)    Type II   Fibromyalgia    GERD (gastroesophageal reflux disease)    better after gall bladder surgery-    Hyperlipidemia    Hypertension    Shortness of breath dyspnea    with exertion    Social History   Socioeconomic History   Marital status: Widowed    Spouse name: Not on file   Number of children: Not on file    Years of education: Not on file   Highest education level: Not on file  Occupational History   Not on file  Tobacco Use   Smoking status: Never   Smokeless tobacco: Never  Vaping Use   Vaping status: Never Used  Substance and Sexual Activity   Alcohol use: No   Drug use: No   Sexual activity: Not Currently  Other Topics Concern   Not on file  Social History Narrative   Not on file   Social Drivers of Health   Financial Resource Strain: Not on file  Food Insecurity: Not on file  Transportation Needs: Not on file  Physical Activity: Not on file  Stress: Not on file  Social Connections: Not on file  Intimate Partner Violence: Not on file    Past Surgical History:  Procedure Laterality Date   ABDOMINAL HYSTERECTOMY  01/15/1995   CATARACT EXTRACTION Bilateral 01/15/2012   CHOLECYSTECTOMY  01/15/2008   COLONOSCOPY     COLONOSCOPY WITH PROPOFOL N/A 04/21/2015   Procedure: COLONOSCOPY WITH PROPOFOL;  Surgeon: Wallace Cullens, MD;  Location: [facility];  Service:  Gastroenterology;  Laterality: N/A;   EYE SURGERY Bilateral    Cataract   LUMBAR WOUND DEBRIDEMENT N/A 10/20/2014   Procedure: LUMBAR WOUND DEBRIDEMENT;  Surgeon: Tia Alert, MD;  Location: [facility];  Service: Neurosurgery;  Laterality: N/A;   MAXIMUM ACCESS (MAS)POSTERIOR LUMBAR INTERBODY FUSION (PLIF) 1 LEVEL N/A 09/16/2014   Procedure: L/4-5 FOR MAXIMUM ACCESS (MAS) POSTERIOR LUMBAR INTERBODY FUSION (PLIF) 1 LEVEL;  Surgeon: Tia Alert, MD;  Location: [facility];  Service: Neurosurgery;  Laterality: N/A;  L/4-5 FOR MAXIMUM ACCESS (MAS) POSTERIOR LUMBAR INTERBODY FUSION (PLIF) 1 LEVEL   REFRACTIVE SURGERY Left     Family History  Problem Relation Age of Onset   Arthritis Mother    Cancer Mother        Ovarian   Diabetes Mother    Heart attack Father    Heart disease Father    Hypertension Father    Cancer Brother    Diabetes Brother     No Known Allergies     Latest Ref Rng & Units 06/12/2021    10:36 AM 01/27/2020    4:18 PM 06/29/2018   12:31 PM  CBC  WBC 3.4 - 10.8 x10E3/uL 8.0  8.6  7.3   Hemoglobin 11.1 - 15.9 g/dL 11.9  14.7  82.9   Hematocrit 34.0 - 46.6 % 39.1  37.7  38.0   Platelets 150 - 450 x10E3/uL 315  270.0  279.0       CMP     Component Value Date/Time   NA 144 06/12/2021 1036   K 4.8 06/12/2021 1036   CL 103 06/12/2021 1036   CO2 23 06/12/2021 1036   GLUCOSE 198 (H) 07/09/2021 1119   BUN 18 06/12/2021 1036   CREATININE 1.01 (H) 06/12/2021 1036   CALCIUM 9.8 06/12/2021 1036   PROT 7.4 06/12/2021 1036   ALBUMIN 4.2 06/12/2021 1036   AST 12 06/12/2021 1036   ALT 12 06/12/2021 1036   ALKPHOS 164 (H) 06/12/2021 1036   BILITOT 0.3 06/12/2021 1036   GFR 43.41 (L) 01/27/2020 1618   EGFR 57 (L) 06/12/2021 1036   GFRNONAA 55 (L) 10/20/2014 1039     No results found.     Assessment & Plan:   1. Lymphedema Recommend:  No surgery or intervention at this point in time.   The Patient is CEAP [id number].  The patient has been wearing compression for more than 12 weeks with no or little benefit.  The patient has been exercising daily for more than 12 weeks. The patient has been elevating and taking OTC pain medications for more than 12 weeks.  None of these have have eliminated the pain related to the lymphedema or the discomfort regarding excessive swelling and venous congestion.    I have reviewed my discussion with the patient regarding lymphedema and why it  causes symptoms.  Patient will continue wearing graduated compression on a daily basis. The patient should put the compression on first thing in the morning and removing them in the evening. The patient should not sleep in the compression.   In addition, behavioral modification throughout the day will be continued.  This will include frequent elevation (such as in a recliner), use of over the counter pain medications as needed and exercise such as walking.  The systemic causes for chronic edema such  as liver, kidney and cardiac etiologies do not appear to have significant changed over the past year.    The patient has chronic , severe  lymphedema with hyperpigmentation of the skin and has done MLD, skin care, medication, diet, exercise, elevation and compression for 4 weeks with no improvement,  I am recommending a lymphedema pump.  The patient still has stage 3 lymphedema and therefore, I believe that a lymph pump is needed to improve the control of the patient's lymphedema and improve the quality of life.  Additionally, a lymph pump is warranted because it will reduce the risk of cellulitis and ulceration in the future.  Patient should follow-up in six months   2. CHF (congestive heart failure), NYHA class I, chronic, diastolic (HCC) Contributes to her swelling but appears to be under good control  3. Chronic kidney disease, stage 3a (HCC) This contributes likely to her swelling  4. Type 2 diabetes mellitus with stage 3 chronic kidney disease, with long-term current use of insulin, unspecified whether stage 3a or 3b CKD (HCC) Continue hypoglycemic medications as already ordered, these medications have been reviewed and there are no changes at this time.  Hgb A1C to be monitored as already arranged by primary service   Current Outpatient Medications on File Prior to Visit  Medication Sig Dispense Refill   Alcohol Swabs (DROPSAFE ALCOHOL PREP) 70 % PADS USE TWICE DAILY 200 each 3   amLODipine (NORVASC) 2.5 MG tablet TAKE 1 TABLET EVERY DAY 90 tablet 3   atorvastatin (LIPITOR) 20 MG tablet TAKE 1 TABLET EVERY DAY 90 tablet 3   Blood Glucose Monitoring Suppl (TRUE METRIX METER) DEVI by Does not apply route.     Cyanocobalamin (B-12 COMPLIANCE INJECTION IJ) Inject as directed.     diclofenac Sodium (VOLTAREN) 1 % GEL Voltaren 1 % topical gel  APPLY 2 GRAMS TO THE AFFECTED AREA(S) BY TOPICAL ROUTE 4 TIMES PER DAY     docusate sodium (COLACE) 100 MG capsule Take 100 mg by mouth daily as  needed for mild constipation. Reported on 03/29/2015     DROPLET INSULIN SYRINGE 30G X 1/2" 1 ML MISC USE TO INJECT INSULIN EVERY DAY WITH BREAKFAST 100 each 2   furosemide (LASIX) 20 MG tablet TAKE 1 TABLET EVERY DAY AS NEEDED (Patient taking differently: 40 mg. TAKE 2 TABLET EVERY DAY AS NEEDED) 90 tablet 0   gabapentin (NEURONTIN) 100 MG capsule Take 1 capsule (100 mg total) by mouth 3 (three) times daily. 90 capsule 3   glucose blood (TRUE METRIX BLOOD GLUCOSE TEST) test strip 1 each by Other route 4 (four) times daily. 400 each 1   insulin NPH-regular Human (NOVOLIN 70/30) (70-30) 100 UNIT/ML injection Take 20units with biggest meal of the day 30 mL 2   losartan (COZAAR) 100 MG tablet Take 1 tablet (100 mg total) by mouth daily. Overdue follow up.  PLEASE CALL OFFICE TO SCHEDULE APPOINTMENT PRIOR TO NEXT REFILL 30 tablet 0   metoprolol succinate (TOPROL-XL) 50 MG 24 hr tablet Take 1 tablet (50 mg total) by mouth daily. Take with or immediately following a meal. 7 tablet 0   metoprolol succinate (TOPROL-XL) 50 MG 24 hr tablet Take 1 tablet (50 mg total) by mouth daily. Take with or immediately following a meal. 90 tablet 3   omeprazole (PRILOSEC) 40 MG capsule Take 1 capsule (40 mg total) by mouth daily. 90 capsule 1   tirzepatide (MOUNJARO) 2.5 MG/0.5ML Pen Inject 2.5 mg into the skin once a week. 6 mL 3   triamcinolone cream (KENALOG) 0.1 % APPLY TOPICALLY 4 (FOUR) TIMES DAILY AS NEEDED FOR ITCHING 80 g 2   TRUEplus  Lancets 33G MISC TEST BLOOD SUGAR TWICE DAILY 200 each 3   No current facility-administered medications on file prior to visit.    There are no Patient Instructions on file for this visit. No follow-ups on file.   Georgiana Spinner, NP

## 2023-02-14 NOTE — Progress Notes (Incomplete)
02/14/2023 Name: Bethany [name] MRN: [id number] DOB: 07-29-1945  Chief Complaint  Patient presents with   Medication Management    Bethany [name] is a 78 y.o. year old female who presented for a telephone visit.   They were referred to the pharmacist by  [facility] Metric report  for assistance in managing diabetes.    Subjective:  Care Team: Primary Care Provider: Alan Ripper ; Next Scheduled Visit: ***  Medication Access/Adherence  Current Pharmacy:  [facility] - [place], [place] - [address] [place] [address] Phone: [phone] Fax: [phone]  [facility] (Now [facility] - [address] [place], [place] - [address] [place] [address] Phone: [phone] Fax: [phone]  [facility] [address] [facility] - [place], [place] - [address] [place] [address] Phone: [phone] Fax: [phone]  [facility] [address] [place] - [address] [place] [address] Phone: [phone] Fax: [phone]   Patient reports affordability concerns with their medications:  Yes, Novolin 70/30 pen cot more than the vials. She is able to pay for but reports that it is expensive (~$160 of $170) but isnt an issue . She likes the pen and would like the keep the pens.  Patient reports access/transportation concerns to their pharmacy: No  Patient reports adherence concerns with their medications:  No     Diabetes:  Current medications:  Medications tried in the past:   Current glucose readings: (No glucose monitor with her and the sensor ran out this morning- could not check BG) *** Fasting - 140 and 150; typically skips breakfast; early nornings bc  Lunch 280 -300 Dinner 250 -300    Using *** meter; testing *** times daily  Date of Download: *** % Time CGM is active: ***% Average  Glucose: *** mg/dL Glucose Management Indicator: ***  Glucose Variability: *** (goal <36%) Time in Goal:  - Time in range 70-180: ***% - Time above range: ***% - Time below range: ***% Observed patterns:  Patient {Actions; denies-reports:120008} hypoglycemic s/sx including ***dizziness, shakiness, sweating. Patient {Actions; denies-reports:120008} hyperglycemic symptoms including ***polyuria, polydipsia, polyphagia, nocturia, neuropathy, blurred vision.   **no hypoo since lOV; denies hypergly Current meal patterns:  - Breakfast/Lunch (brunch): bacon/apple sauce, oatmeal, and toast when she eats (usually eat  - Supper oven-baked chicken, veggies, fish  - Snacks peanuts, dried apples w/cinnamon small package, apple saouce ice cream (occassionally) that is pre-package for smal serving, raisins - Drinks glucerna, not a lot of water, bottle tea (green, peach) regular at least once a day ; coffee (artifcle sweetner)   Current physical activity: she walks with a cane and back hurts when she walks too much; put some of her friends are interested in water reobic nd she might join (she already spoke with MD about water aerobics)  Current medication access support: N/A   Objective:  Lab Results  Component Value Date   HGBA1C 8.0 (A) 01/02/2023    Lab Results  Component Value Date   CREATININE 1.01 (H) 06/12/2021   BUN 18 06/12/2021   NA 144 06/12/2021   K 4.8 06/12/2021   CL 103 06/12/2021   CO2 23 06/12/2021    Lab Results  Component Value Date   CHOL 139 07/09/2021   HDL 41.70 07/09/2021   LDLCALC 65 07/09/2021   TRIG 164.0 (H) 07/09/2021  CHOLHDL 3 07/09/2021    Medications Reviewed Today     Reviewed by Katha Cabal, [facility] (Pharmacist) on 02/14/23 at 1135  Med List Status: <None>   Medication Order Taking? Sig Documenting Provider Last Dose Status Informant  Alcohol Swabs (DROPSAFE ALCOHOL PREP) 70 % PADS [id number] Yes USE TWICE DAILY Reather Littler, MD Taking Active    amLODipine (NORVASC) 2.5 MG tablet [id number] Yes TAKE 1 TABLET EVERY DAY McDonough, Lauren K, PA-C Taking Active   atorvastatin (LIPITOR) 20 MG tablet [id number] Yes TAKE 1 TABLET EVERY DAY McDonough, Lauren K, PA-C Taking Active   Blood Glucose Monitoring Suppl (TRUE METRIX METER) DEVI [id number] Yes by Does not apply route. Lorre Munroe, NP Taking Active   Cyanocobalamin (B-12 COMPLIANCE INJECTION IJ) [id number] No Inject as directed.  Patient not taking: Reported on 02/14/2023   [provider] Not Taking Active   diclofenac Sodium (VOLTAREN) 1 % GEL [id number] Yes Voltaren 1 % topical gel  APPLY 2 GRAMS TO THE AFFECTED AREA(S) BY TOPICAL ROUTE 4 TIMES PER DAY [provider] Taking Active   docusate sodium (COLACE) 100 MG capsule [id number] No Take 100 mg by mouth daily as needed for mild constipation. Reported on 03/29/2015  Patient not taking: Reported on 02/14/2023   [provider] Not Taking Active   DROPLET INSULIN SYRINGE 30G X 1/2" 1 ML MISC [id number] No USE TO INJECT INSULIN EVERY DAY WITH BREAKFAST  Patient not taking: Reported on 02/14/2023   Romero Belling, MD Not Taking Active   furosemide (LASIX) 20 MG tablet [id number] Yes TAKE 1 TABLET EVERY DAY AS NEEDED  Patient taking differently: 40 mg. TAKE 2 TABLET EVERY DAY AS NEEDED   Sampson Si Salvadore Oxford, NP Taking Active   gabapentin (NEURONTIN) 100 MG capsule [id number] No Take 1 capsule (100 mg total) by mouth 3 (three) times daily.  Patient not taking: Reported on 02/14/2023   Carlean Jews, PA-C Not Taking Active   glucose blood (TRUE METRIX BLOOD GLUCOSE TEST) test strip [id number] Yes 1 each by Other route 4 (four) times daily. Lorre Munroe, NP Taking Active   insulin NPH-regular Human (NOVOLIN 70/30) (70-30) 100 UNIT/ML injection [id number] Yes Take 20units with biggest meal of the day McDonough, Salomon Fick, PA-C Taking Active   losartan (COZAAR) 100 MG tablet [id number] Yes Take 1 tablet (100 mg total) by  mouth daily. Overdue follow up.  PLEASE CALL OFFICE TO SCHEDULE APPOINTMENT PRIOR TO NEXT REFILL Iran Ouch, MD Taking Active            Med Note Para March, [facility] R   Fri Feb 14, 2023 11:33 AM) Disregard, patient states she pores tablets in old bottles; she is actually taking 100 mg of losartan  metoprolol succinate (TOPROL-XL) 50 MG 24 hr tablet [id number] No Take 1 tablet (50 mg total) by mouth daily. Take with or immediately following a meal.  Patient not taking: Reported on 02/14/2023   Carlean Jews, PA-C Not Taking Active            Med Note Para March, [facility] R   Fri Feb 14, 2023 11:34 AM) duplicate  metoprolol succinate (TOPROL-XL) 50 MG 24 hr tablet [id number] Yes Take 1 tablet (50 mg total) by mouth daily. Take with or immediately following a meal. McDonough, Lauren K, PA-C Taking Active   omeprazole (PRILOSEC) 40 MG capsule [id number] Yes Take 1 capsule (40 mg total) by mouth daily. McDonough, Salomon Fick, PA-C Taking Active   tirzepatide [facility]) 2.5  MG/0.5ML Pen [id number] No Inject 2.5 mg into the skin once a week.  Patient not taking: Reported on 02/14/2023   Carlean Jews, PA-C Not Taking Active            Med Note Para March, [facility] R   Fri Feb 14, 2023 11:34 AM) Not eating well - so the medication was stopped.   triamcinolone cream (KENALOG) 0.1 % [id number] Yes APPLY TOPICALLY 4 (FOUR) TIMES DAILY AS NEEDED FOR ITCHING McDonough, Lauren K, PA-C Taking Active   TRUEplus Lancets 33G MISC [id number] Yes TEST BLOOD SUGAR TWICE DAILY Reather Littler, MD Taking Active               Assessment/Plan:  Praised patient for A1c and she plans to go back on [facility] when she feels better   Diabetes: - Currently {CHL Controlled/[phone]} - Reviewed long term cardiovascular and renal outcomes of uncontrolled blood sugar - Reviewed goal A1c, goal fasting, and goal 2 hour post prandial glucose - Reviewed dietary modifications including *** - Reviewed lifestyle  modifications including:  - Recommend to ***  - Patient denies personal or family history of multiple endocrine neoplasia type 2, medullary thyroid cancer; personal history of pancreatitis or gallbladder disease. - Recommend to check glucose *** - Meets financial criteria for *** patient assistance program through ***. Will collaborate with provider, CPhT, and patient to pursue assistance.    Time spentt 41 min  Follow Up Plan: ***  ***

## 2023-02-16 DIAGNOSIS — E1165 Type 2 diabetes mellitus with hyperglycemia: Secondary | ICD-10-CM | POA: Diagnosis not present

## 2023-02-19 ENCOUNTER — Encounter: Payer: Self-pay | Admitting: Cardiology

## 2023-02-19 ENCOUNTER — Ambulatory Visit: Payer: Medicare HMO | Attending: Cardiology | Admitting: Cardiology

## 2023-02-19 VITALS — BP 142/78 | HR 74 | Ht 68.0 in | Wt 274.8 lb

## 2023-02-19 DIAGNOSIS — Z794 Long term (current) use of insulin: Secondary | ICD-10-CM | POA: Diagnosis not present

## 2023-02-19 DIAGNOSIS — I5032 Chronic diastolic (congestive) heart failure: Secondary | ICD-10-CM

## 2023-02-19 DIAGNOSIS — E785 Hyperlipidemia, unspecified: Secondary | ICD-10-CM | POA: Diagnosis not present

## 2023-02-19 DIAGNOSIS — E1122 Type 2 diabetes mellitus with diabetic chronic kidney disease: Secondary | ICD-10-CM

## 2023-02-19 DIAGNOSIS — G4733 Obstructive sleep apnea (adult) (pediatric): Secondary | ICD-10-CM

## 2023-02-19 DIAGNOSIS — I10 Essential (primary) hypertension: Secondary | ICD-10-CM | POA: Diagnosis not present

## 2023-02-19 DIAGNOSIS — N183 Chronic kidney disease, stage 3 unspecified: Secondary | ICD-10-CM | POA: Diagnosis not present

## 2023-02-19 NOTE — Progress Notes (Signed)
 Cardiology Office Note:  .   Date:  02/19/2023  ID:  Bethany [name], DOB 1946-01-08, MRN [id number] PCP: Bethany [name] Cardiologist:  Deatrice Cage, MD    History of Present Illness: Bethany [name] is a 78 y.o. female with a past medical history of type 2 diabetes, stage III chronic kidney disease, essential hypertension, hyperlipidemia, morbid obesity, lymphedema, fibromyalgia, GERD, obstructive sleep apnea, who is here today to follow up on hypertension and chronic DOE.   She had previous echocardiogram completed 10/14/2017 revealing a normal LV systolic function, G1 DD, normal pulmonary pressure no significant valvular abnormalities.  She then she reported having COVID in September 2021 which did show worsening shortness of breath, fatigue, and a gradual 30 pound weight gain with increasing leg edema.  Repeat echocardiogram completed in November 2022 showed normal LV systolic close, G2 DD, and no significant valvular abnormalities.   She was last seen in clinic 02/12/2022 by Dr.Arida.  At that time she reported her symptoms continue with chronic exertional dyspnea but denied any chest discomfort.  Her biggest issue recently been lower extremity edema with occasional blisters.  She was seen by vascular surgery was being treated for lymphedema at the time.  She was continued on furosemide  20 mg daily, losartan  was increased to 100 mg daily and amlodipine  was decreased to 2.5 mg daily.  She was recommended follow-up in 6 months to see if discontinuation of amlodipine  would be beneficial.  She returns to clinic today stating that she has been doing without complaints of angina or anginal equivalents. She continues to have chronic dyspnea on exertion when walking for extended distances. She is excited about her upcoming birthday as she has a trip planned to [place]. She has taken her medication as prescribed without side effects. Denies any hospitalizations or  visits to the emergency department.   ROS: 10 point review of systems has been reviewed and considered negative with exception of what is been listed in the HPI  Studies Reviewed: Bethany   EKG Interpretation Date/Time:  Wednesday February 19 2023 11:11:56 EST Ventricular Rate:  74 PR Interval:  138 QRS Duration:  84 QT Interval:  386 QTC Calculation: 428 R Axis:   -19  Text Interpretation: Normal sinus rhythm Normal ECG When compared with ECG of 04-Mar-2018 09:51, Confirmed by Bethany [name] ([id number]) on 02/19/2023 11:33:13 AM    2D echo 11/15/2020 1. Left ventricular ejection fraction, by estimation, is 60 to 65%. Left  ventricular ejection fraction by 2D MOD biplane is 68.9 %. The left  ventricle has normal function. The left ventricle has no regional wall  motion abnormalities. Left ventricular  diastolic parameters are consistent with Grade II diastolic dysfunction  (pseudonormalization).   2. Right ventricular systolic function is normal. The right ventricular  size is normal.   3. Left atrial size was mildly dilated.   4. The mitral valve is normal in structure. No evidence of mitral valve  regurgitation.   5. The aortic valve is tricuspid. Aortic valve regurgitation is not  visualized.   2D echo 10/23/2017 Study Conclusions  - Left ventricle: The cavity size was normal. Systolic function was    normal. The estimated ejection fraction was in the range of 60%    to 65%. Wall motion was normal; there were no regional wall    motion abnormalities. Doppler parameters are consistent with    abnormal left ventricular relaxation (grade 1 diastolic  dysfunction).  - Left atrium: The atrium was normal in size.  - Right ventricle: Systolic function was normal.  - Pulmonary arteries: Systolic pressure was within the normal    range.    Risk Assessment/Calculations:     HYPERTENSION CONTROL Vitals:   02/19/23 1107 02/19/23 1115  BP: (!) 153/83 (!) 142/78    The patient's blood  pressure is elevated above target today.  In order to address the patient's elevated BP: Blood pressure will be monitored at home to determine if medication changes need to be made. (patient has not had any of her mediation yet this morning as she states she takes them with her first meal)          Physical Exam:   VS:  BP (!) 142/78 (BP Location: Left Arm, Patient Position: Sitting, Cuff Size: Large)   Pulse 74   Ht 5' 8 (1.727 m)   Wt 274 lb 12.8 oz (124.6 kg)   SpO2 99%   BMI 41.78 kg/m    Wt Readings from Last 3 Encounters:  02/19/23 274 lb 12.8 oz (124.6 kg)  02/13/23 274 lb 6.4 oz (124.5 kg)  02/06/23 274 lb (124.3 kg)    GEN: Well nourished, well developed in no acute distress NECK: No JVD; No carotid bruits CARDIAC: RRR, II/VI systolic murmur RUSB without rubs or gallops RESPIRATORY:  Clear to auscultation without rales, wheezing or rhonchi  ABDOMEN: Soft, non-tender, non-distended EXTREMITIES:  Trace pretibial edema; No deformity   ASSESSMENT AND PLAN: .   Chronic HFpEF with chronic exertional dyspnea that is unchanged. NYHA class I symptoms. LVEF 60-65%, no RWMA, G2 DD in 11/2020.  She denies any changes in symptoms.  Weight has remained stable.  Lower extremity edema likely multifactorial due to component of heart failure and lymphedema has improved with reduction in amlodipine .  She is continued on furosemide  20 mg daily as needed for swelling.  She is continued on metoprolol  XL 50 mg daily.  Encouraged to continue to weigh daily and limit her sodium intake.  Hypertension with blood pressure today 153/83 recheck 142/78. Slightly elevated this morning. Patient has not had any of her medications at this time. She has been continued on amlodipine  2.5 mg daily, lasix  20 mg daily as needed, losartan  100 mg daily, and Toprol  XL 50 mg daily. She has been encouraged to monitor blood pressure 1-2 hours after she has taken her medications.  Hyperlipidemia with last LDL 65.  Continued on atorvastatin  20 mg daily. Updated lipid panel ordered as last panel was completed in 2023.  Type II diabetes with recent increase in blood glucose levels. A1c 8 which was improved from previous testing. She has been continued on insulin . This continues to me managed by her PCP.  OSA with recent sleep study completed. She states that CPAP did not work for her but will need titration study and may benefit from nasal pillow to limit face mask gear.  Morbid Obesity with BMI 41.78. Has been on Mounjaro  and continued with obesity counseling by PCP.        Dispo: Patient return to clinic to see MD/APP in 1 year or sooner if needed for reevaluation of symptoms.  Signed, Aliou Mealey, NP

## 2023-02-19 NOTE — Patient Instructions (Signed)
 Medication Instructions:  No changes *If you need a refill on your cardiac medications before your next appointment, please call your pharmacy*   Lab Work: Please have the fasting lipid lab drawn at your PCP. The order has been proived.  If you have labs (blood work) drawn today and your tests are completely normal, you will receive your results only by: MyChart Message (if you have MyChart) OR A paper copy in the mail If you have any lab test that is abnormal or we need to change your treatment, we will call you to review the results.   Testing/Procedures: None ordered   Follow-Up: At [facility], you and your health needs are our priority.  As part of our continuing mission to provide you with exceptional heart care, we have created designated Provider Care Teams.  These Care Teams include your primary Cardiologist (physician) and Advanced Practice Providers (APPs -  Physician Assistants and Nurse Practitioners) who all work together to provide you with the care you need, when you need it.  We recommend signing up for the patient portal called MyChart.  Sign up information is provided on this After Visit Summary.  MyChart is used to connect with patients for Virtual Visits (Telemedicine).  Patients are able to view lab/test results, encounter notes, upcoming appointments, etc.  Non-urgent messages can be sent to your provider as well.   To learn more about what you can do with MyChart, go to [email or web address].    Your next appointment:   12 month(s)  Provider:   You may see Deatrice Cage, MD or one of the following Advanced Practice Providers on your designated Care Team:   Lonni Meager, NP Bernardino Bring, PA-C Cadence Franchester, PA-C Tylene Lunch, NP Barnie Hila, NP

## 2023-03-04 ENCOUNTER — Ambulatory Visit (INDEPENDENT_AMBULATORY_CARE_PROVIDER_SITE_OTHER): Payer: Medicare HMO | Admitting: Internal Medicine

## 2023-03-04 ENCOUNTER — Encounter: Payer: Self-pay | Admitting: Internal Medicine

## 2023-03-04 VITALS — BP 140/90 | HR 68 | Temp 98.6°F | Resp 16 | Ht 68.0 in | Wt 276.2 lb

## 2023-03-04 DIAGNOSIS — E1165 Type 2 diabetes mellitus with hyperglycemia: Secondary | ICD-10-CM | POA: Diagnosis not present

## 2023-03-04 DIAGNOSIS — E662 Morbid (severe) obesity with alveolar hypoventilation: Secondary | ICD-10-CM

## 2023-03-04 DIAGNOSIS — Z794 Long term (current) use of insulin: Secondary | ICD-10-CM

## 2023-03-04 DIAGNOSIS — I10 Essential (primary) hypertension: Secondary | ICD-10-CM | POA: Diagnosis not present

## 2023-03-04 DIAGNOSIS — E66813 Obesity, class 3: Secondary | ICD-10-CM

## 2023-03-04 DIAGNOSIS — Z6841 Body Mass Index (BMI) 40.0 and over, adult: Secondary | ICD-10-CM | POA: Diagnosis not present

## 2023-03-04 DIAGNOSIS — Z6379 Other stressful life events affecting family and household: Secondary | ICD-10-CM

## 2023-03-04 MED ORDER — TIRZEPATIDE 2.5 MG/0.5ML ~~LOC~~ SOAJ
2.5000 mg | SUBCUTANEOUS | 12 refills | Status: AC
Start: 2023-03-04 — End: ?

## 2023-03-04 NOTE — Progress Notes (Signed)
[facility] [address] [place], [place] [address]  Internal MEDICINE  Office Visit Note  Patient Name: Bethany [name]  [id number]  [id number]  Date of Service: 03/04/2023  Chief Complaint  Patient presents with   Follow-up   Diabetes   Gastroesophageal Reflux   Hypertension   Hyperlipidemia   Quality Metric Gaps    Pt to schedule eye exam    HPI Pt is here for diabetic follow up She has been on multiple diabetic medications in the past, adherence to medication is poor She is also having fluctuating sugar due to inconsistent diet At times she is also using 3 different type of insulin including makes long-acting and short acting She did not bring her glucometer and I do see a wide range of between her numbers minimum of 101 to maximum over 400 According to patient she cannot tolerate higher dose of Mounjaro due to total loss of appetite Family death, illness stress     Current Medication: Outpatient Encounter Medications as of 03/04/2023  Medication Sig Note   Alcohol Swabs (DROPSAFE ALCOHOL PREP) 70 % PADS USE TWICE DAILY    amLODipine (NORVASC) 2.5 MG tablet TAKE 1 TABLET EVERY DAY    atorvastatin (LIPITOR) 20 MG tablet TAKE 1 TABLET EVERY DAY    Blood Glucose Monitoring Suppl (TRUE METRIX METER) DEVI by Does not apply route.    Cyanocobalamin (B-12 COMPLIANCE INJECTION IJ) Inject as directed.    diclofenac Sodium (VOLTAREN) 1 % GEL Voltaren 1 % topical gel  APPLY 2 GRAMS TO THE AFFECTED AREA(S) BY TOPICAL ROUTE 4 TIMES PER DAY    docusate sodium (COLACE) 100 MG capsule Take 100 mg by mouth daily as needed for mild constipation. Reported on 03/29/2015    DROPLET INSULIN SYRINGE 30G X 1/2" 1 ML MISC USE TO INJECT INSULIN EVERY DAY WITH BREAKFAST    furosemide (LASIX) 20 MG tablet TAKE 1 TABLET EVERY DAY AS NEEDED (Patient taking differently: 40 mg. TAKE 2 TABLET EVERY DAY AS NEEDED)    gabapentin (NEURONTIN) 100 MG capsule Take 1 capsule (100 mg total) by mouth  3 (three) times daily.    glucose blood (TRUE METRIX BLOOD GLUCOSE TEST) test strip 1 each by Other route 4 (four) times daily.    insulin NPH-regular Human (NOVOLIN 70/30) (70-30) 100 UNIT/ML injection Take 20units with biggest meal of the day    losartan (COZAAR) 100 MG tablet Take 1 tablet (100 mg total) by mouth daily. Overdue follow up.  PLEASE CALL OFFICE TO SCHEDULE APPOINTMENT PRIOR TO NEXT REFILL 02/14/2023: Disregard, patient states she pores tablets in old bottles; she is actually taking 100 mg of losartan   metoprolol succinate (TOPROL-XL) 50 MG 24 hr tablet Take 1 tablet (50 mg total) by mouth daily. Take with or immediately following a meal. 02/14/2023: duplicate   metoprolol succinate (TOPROL-XL) 50 MG 24 hr tablet Take 1 tablet (50 mg total) by mouth daily. Take with or immediately following a meal.    omeprazole (PRILOSEC) 40 MG capsule Take 1 capsule (40 mg total) by mouth daily.    tirzepatide [facility]) 2.5 MG/0.5ML Pen Inject 2.5 mg into the skin once a week.    triamcinolone cream (KENALOG) 0.1 % APPLY TOPICALLY 4 (FOUR) TIMES DAILY AS NEEDED FOR ITCHING    TRUEplus Lancets 33G MISC TEST BLOOD SUGAR TWICE DAILY    [DISCONTINUED] tirzepatide (MOUNJARO) 2.5 MG/0.5ML Pen Inject 2.5 mg into the skin once a week. 02/14/2023: Not eating well - so the  medication was stopped.    No facility-administered encounter medications on file as of 03/04/2023.    Surgical History: Past Surgical History:  Procedure Laterality Date   ABDOMINAL HYSTERECTOMY  01/15/1995   CATARACT EXTRACTION Bilateral 01/15/2012   CHOLECYSTECTOMY  01/15/2008   COLONOSCOPY     COLONOSCOPY WITH PROPOFOL N/A 04/21/2015   Procedure: COLONOSCOPY WITH PROPOFOL;  Surgeon: Wallace Cullens, MD;  Location: [facility] ENDOSCOPY;  Service: Gastroenterology;  Laterality: N/A;   EYE SURGERY Bilateral    Cataract   LUMBAR WOUND DEBRIDEMENT N/A 10/20/2014   Procedure: LUMBAR WOUND DEBRIDEMENT;  Surgeon: Tia Alert, MD;  Location: [facility]  NEURO ORS;  Service: Neurosurgery;  Laterality: N/A;   MAXIMUM ACCESS (MAS)POSTERIOR LUMBAR INTERBODY FUSION (PLIF) 1 LEVEL N/A 09/16/2014   Procedure: L/4-5 FOR MAXIMUM ACCESS (MAS) POSTERIOR LUMBAR INTERBODY FUSION (PLIF) 1 LEVEL;  Surgeon: Tia Alert, MD;  Location: [facility] NEURO ORS;  Service: Neurosurgery;  Laterality: N/A;  L/4-5 FOR MAXIMUM ACCESS (MAS) POSTERIOR LUMBAR INTERBODY FUSION (PLIF) 1 LEVEL   REFRACTIVE SURGERY Left     Medical History: Past Medical History:  Diagnosis Date   Arthritis    Chronic kidney disease    Diabetes mellitus without complication (HCC)    Type II   Fibromyalgia    GERD (gastroesophageal reflux disease)    better after gall bladder surgery-    Hyperlipidemia    Hypertension    Shortness of breath dyspnea    with exertion    Family History: Family History  Problem Relation Age of Onset   Arthritis Mother    Cancer Mother        Ovarian   Diabetes Mother    Heart attack Father    Heart disease Father    Hypertension Father    Cancer Brother    Diabetes Brother     Social History   Socioeconomic History   Marital status: Widowed    Spouse name: Not on file   Number of children: Not on file   Years of education: Not on file   Highest education level: Not on file  Occupational History   Not on file  Tobacco Use   Smoking status: Never   Smokeless tobacco: Never  Vaping Use   Vaping status: Never Used  Substance and Sexual Activity   Alcohol use: No   Drug use: No   Sexual activity: Not Currently  Other Topics Concern   Not on file  Social History Narrative   Not on file   Social Drivers of Health   Financial Resource Strain: Not on file  Food Insecurity: Not on file  Transportation Needs: Not on file  Physical Activity: Not on file  Stress: Not on file  Social Connections: Not on file  Intimate Partner Violence: Not on file      Review of Systems  Constitutional:  Negative for fatigue and fever.  HENT:   Negative for congestion, mouth sores and postnasal drip.   Respiratory:  Negative for cough.   Cardiovascular:  Negative for chest pain.  Genitourinary:  Negative for flank pain.  Psychiatric/Behavioral: Negative.      Vital Signs: BP (!) 140/90   Pulse 68   Temp 98.6 F (37 C)   Resp 16   Ht 5\' 8"  (1.727 m)   Wt 276 lb 3.2 oz (125.3 kg)   SpO2 98%   BMI 42.00 kg/m    Physical Exam Constitutional:      Appearance: Normal appearance.  HENT:  Head: Normocephalic and atraumatic.     Nose: Nose normal.     Mouth/Throat:     Mouth: Mucous membranes are moist.     Pharynx: No posterior oropharyngeal erythema.  Eyes:     Extraocular Movements: Extraocular movements intact.     Pupils: Pupils are equal, round, and reactive to light.  Cardiovascular:     Pulses: Normal pulses.     Heart sounds: Normal heart sounds.  Pulmonary:     Effort: Pulmonary effort is normal.     Breath sounds: Normal breath sounds.  Neurological:     General: No focal deficit present.     Mental Status: She is alert.  Psychiatric:        Mood and Affect: Mood normal.        Behavior: Behavior normal.        Assessment/Plan: 1. Type 2 diabetes mellitus with hyperglycemia, with long-term current use of insulin (HCC) (Primary) Pt will stop her mix 70//30, she will restart her Greggory Keen will lower dose to see before we can control her nausea patient would like to continue on this because this does help with her appetite control Patient will take her short acting insulin at home she is not sure about the name, sliding scale is given as follows .  Use 6 to 8 units for lighter meal and snacks, use 8 to 12 units for medium sized meal, and any meal which has a dessert she needs to use more 14 units.  Patient was strictly encouraged to to use protein with each meal - tirzepatide [facility]) 2.5 MG/0.5ML Pen; Inject 2.5 mg into the skin once a week.  Dispense: 2 mL; Refill: 12  2. 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 Benign hypertension Baseline blood pressure is elevated according to her she has stress at home due to loss of a family member  4.  stress due to illness of family member Emotional support was provided  General Counseling: Atianna verbalizes understanding of the findings of todays visit and agrees with plan of treatment. I have discussed any further diagnostic evaluation that may be needed or ordered today. We also reviewed her medications today. she has been encouraged to call the office with any questions or concerns that should arise related to todays visit.    No orders of the defined types were placed in this encounter.   Meds ordered this encounter  Medications   tirzepatide (MOUNJARO) 2.5 MG/0.5ML Pen    Sig: Inject 2.5 mg into the skin once a week.    Dispense:  2 mL    Refill:  12    Total time spent:46 Minutes Time spent includes review of chart, medications, test results, and follow up plan with the patient.   [place] Controlled Substance Database was reviewed by me.   Dr Lyndon Code Internal medicine

## 2023-03-14 DIAGNOSIS — I10 Essential (primary) hypertension: Secondary | ICD-10-CM | POA: Diagnosis not present

## 2023-03-14 DIAGNOSIS — E782 Mixed hyperlipidemia: Secondary | ICD-10-CM | POA: Diagnosis not present

## 2023-03-14 DIAGNOSIS — K219 Gastro-esophageal reflux disease without esophagitis: Secondary | ICD-10-CM | POA: Diagnosis not present

## 2023-03-14 DIAGNOSIS — Z789 Other specified health status: Secondary | ICD-10-CM | POA: Diagnosis not present

## 2023-03-14 DIAGNOSIS — Z7689 Persons encountering health services in other specified circumstances: Secondary | ICD-10-CM | POA: Diagnosis not present

## 2023-03-14 DIAGNOSIS — E1165 Type 2 diabetes mellitus with hyperglycemia: Secondary | ICD-10-CM | POA: Diagnosis not present

## 2023-03-25 ENCOUNTER — Ambulatory Visit: Payer: Medicare HMO | Admitting: Internal Medicine

## 2023-03-26 DIAGNOSIS — E663 Overweight: Secondary | ICD-10-CM | POA: Diagnosis not present

## 2023-03-26 DIAGNOSIS — E1122 Type 2 diabetes mellitus with diabetic chronic kidney disease: Secondary | ICD-10-CM | POA: Diagnosis not present

## 2023-03-26 DIAGNOSIS — N1831 Chronic kidney disease, stage 3a: Secondary | ICD-10-CM | POA: Diagnosis not present

## 2023-03-26 DIAGNOSIS — R609 Edema, unspecified: Secondary | ICD-10-CM | POA: Diagnosis not present

## 2023-03-26 DIAGNOSIS — I509 Heart failure, unspecified: Secondary | ICD-10-CM | POA: Diagnosis not present

## 2023-03-26 DIAGNOSIS — I5042 Chronic combined systolic (congestive) and diastolic (congestive) heart failure: Secondary | ICD-10-CM | POA: Diagnosis not present

## 2023-03-26 DIAGNOSIS — I10 Essential (primary) hypertension: Secondary | ICD-10-CM | POA: Diagnosis not present

## 2023-03-26 DIAGNOSIS — E785 Hyperlipidemia, unspecified: Secondary | ICD-10-CM | POA: Diagnosis not present

## 2023-03-27 ENCOUNTER — Telehealth: Payer: Self-pay

## 2023-03-27 NOTE — Progress Notes (Signed)
   03/27/2023  Patient ID: Bethany [name], female   DOB: June 29, 1945, 78 y.o.   MRN: [id number]  Pharmacy Quality Measure Review  This patient is appearing on a report for being at risk of failing the adherence measure for cholesterol (statin), diabetes, and hypertension (ACEi/ARB) medications this calendar year.   - [facility]: Atorvastatin 20 mg last filled on 06/15/2022 x 90 d/s, 2 refills remaining per Dr. Tiajuana Amass  --- LDL 65 (as of 07/09/2021) due for lipid panel at the next office visit  - [facility]: Losartan 100 mg last filled 06/05/2022 x 90 with no remaining refills  -- per chart review, this patient should be on this medication as it has not been d/c'd  -- last BP on 03/04/2023 140/90 -- Medication note 09/18/2022, indicated patient needed to schedule and appointment with cardiology for additional refills   -MAD: Mounjaro 2.5 mg once weekly; was on HOLD d/t GI intolerance; last filled on 12/19/2022        -  Last A1c 8% (as of 01/02/2023) due for A1c at the next office visit  Left voicemail for patient to return my call at their convenience. and MyChart message sent to patient.  Cephus Shelling, PharmD Clinical Pharmacist Cell: [phone]

## 2023-04-24 DIAGNOSIS — E1165 Type 2 diabetes mellitus with hyperglycemia: Secondary | ICD-10-CM | POA: Diagnosis not present

## 2023-04-24 DIAGNOSIS — I10 Essential (primary) hypertension: Secondary | ICD-10-CM | POA: Diagnosis not present

## 2023-04-24 DIAGNOSIS — E782 Mixed hyperlipidemia: Secondary | ICD-10-CM | POA: Diagnosis not present

## 2023-04-24 DIAGNOSIS — F5101 Primary insomnia: Secondary | ICD-10-CM | POA: Diagnosis not present

## 2023-05-09 ENCOUNTER — Telehealth: Payer: Self-pay

## 2023-05-09 DIAGNOSIS — Z79899 Other long term (current) drug therapy: Secondary | ICD-10-CM

## 2023-05-09 NOTE — Progress Notes (Signed)
   05/09/2023  Patient ID: Bethany [name], female   DOB: 1945-05-18, 78 y.o.   MRN: [id number]   Patient was identified as falling into the [place] Measure - Diabetes.   Patient was: Requires a call back at a later time.  Per Dr. Anson Basta, Mounjaro  2.5 mg last filled on 04/30/2023 for 84 days supply from mail order. There still is not a fill history for losartan  100 mg or atorvastatin  20 mg.  I called and left a message.  Next appointment with PCP on 09/18/23.   Alexandria Angel, PharmD Clinical Pharmacist Cell: [phone]

## 2023-05-26 ENCOUNTER — Telehealth: Payer: Self-pay | Admitting: Cardiovascular Disease

## 2023-05-26 NOTE — Telephone Encounter (Signed)
   Pre-operative Risk Assessment    Patient Name: Bethany [name]  DOB: Apr 10, 1945 MRN: [id number]   Date of last office visit: 02/19/23 Date of next office visit: n/a   Request for Surgical Clearance    Procedure:  Cleaning  Date of Surgery:  Clearance TBD                                Surgeon:  Dr. [place] Thomas Surgeon's Group or Practice Name:  [facility] Phone number:  [phone] Fax number:  [phone]   Type of Clearance Requested:   - Medical    Type of Anesthesia:  Not Indicated   Additional requests/questions:    Signed, Fletcher Humble   05/26/2023, 8:12 AM

## 2023-05-26 NOTE — Telephone Encounter (Signed)
   Patient Name: Bethany [name]  DOB: May 22, 1945 MRN: [id number]  Primary Cardiologist: Antionette Kirks, MD  Chart reviewed as part of pre-operative protocol coverage.   Dental cleanings and extractions of 1-2 teeth are considered low risk procedures per guidelines and generally do not require any specific cardiac clearance. It is also generally accepted that for extractions of 1-2 teeth and dental cleanings, there is no need to interrupt blood thinner therapy.  SBE prophylaxis is not required for the patient from a cardiac standpoint.  I will route this recommendation to the requesting party via Epic fax function and remove from pre-op pool.  Please call with questions.  Morey Ar, NP 05/26/2023, 4:35 PM

## 2023-05-27 NOTE — Telephone Encounter (Signed)
 Documents faxed

## 2023-05-27 NOTE — Telephone Encounter (Addendum)
 [facility] called stating they need a copy of her full medication list and her last ejection faction.  If that can be faxed to [phone].

## 2023-06-03 ENCOUNTER — Encounter (INDEPENDENT_AMBULATORY_CARE_PROVIDER_SITE_OTHER): Payer: Self-pay

## 2023-06-05 DIAGNOSIS — E1165 Type 2 diabetes mellitus with hyperglycemia: Secondary | ICD-10-CM | POA: Diagnosis not present

## 2023-06-16 ENCOUNTER — Telehealth: Payer: Self-pay

## 2023-06-16 DIAGNOSIS — Z79899 Other long term (current) drug therapy: Secondary | ICD-10-CM

## 2023-06-16 NOTE — Progress Notes (Signed)
   06/16/2023  Patient ID: Bethany [name], female   DOB: 09-10-1945, 78 y.o.   MRN: [id number]  Attempted to contact patient for medication management/review. Left HIPAA compliant message for patient to return my call at their convenience. Regarding [employer] report and Medication adherence for diabetes (but will be excluded from the measure if patient's insulin  is billed with [employer].   Attempt for patient outreach but was unable to [name]. Will follow up with patient in 5-7 business days. Per Dr. Anson Basta, patient last filled/picked up Losartan  on 05/14/23 for 90-days supply and it seems Mounjaro  was return as there is not a sold date. Per Dr. Anson Basta, Mounjaro  2.5 mg last filled on 04/30/2023 for 84 days supply from mail order. Per chart review  of nephrology note office note on 03/26/2023 (Care Everywhere),  Mounjaro  "was started for weight loss but patient had not lost weight since last office visit". Medication as previously on hold until patient was able to resume d/t prior documentation of intolerance. Upcoming appointments with PCP on 09/18/2023.   Thank you for allowing pharmacy to be a part of this patient's care.  Alexandria Angel, PharmD Clinical Pharmacist Cell: [phone]

## 2023-06-19 DIAGNOSIS — E782 Mixed hyperlipidemia: Secondary | ICD-10-CM | POA: Diagnosis not present

## 2023-06-19 DIAGNOSIS — I872 Venous insufficiency (chronic) (peripheral): Secondary | ICD-10-CM | POA: Diagnosis not present

## 2023-06-19 DIAGNOSIS — N1831 Chronic kidney disease, stage 3a: Secondary | ICD-10-CM | POA: Diagnosis not present

## 2023-06-19 DIAGNOSIS — F5101 Primary insomnia: Secondary | ICD-10-CM | POA: Diagnosis not present

## 2023-06-19 DIAGNOSIS — E1142 Type 2 diabetes mellitus with diabetic polyneuropathy: Secondary | ICD-10-CM | POA: Insufficient documentation

## 2023-06-19 DIAGNOSIS — G4733 Obstructive sleep apnea (adult) (pediatric): Secondary | ICD-10-CM | POA: Diagnosis not present

## 2023-06-19 DIAGNOSIS — I5032 Chronic diastolic (congestive) heart failure: Secondary | ICD-10-CM | POA: Diagnosis not present

## 2023-06-24 ENCOUNTER — Other Ambulatory Visit: Payer: Self-pay | Admitting: Nurse Practitioner

## 2023-06-24 DIAGNOSIS — Z1231 Encounter for screening mammogram for malignant neoplasm of breast: Secondary | ICD-10-CM

## 2023-06-26 ENCOUNTER — Encounter (INDEPENDENT_AMBULATORY_CARE_PROVIDER_SITE_OTHER): Payer: Self-pay

## 2023-06-26 ENCOUNTER — Ambulatory Visit (INDEPENDENT_AMBULATORY_CARE_PROVIDER_SITE_OTHER): Admitting: Nurse Practitioner

## 2023-06-26 VITALS — BP 168/79 | HR 74 | Resp 16 | Wt 283.4 lb

## 2023-06-26 DIAGNOSIS — I890 Lymphedema, not elsewhere classified: Secondary | ICD-10-CM

## 2023-06-26 NOTE — Progress Notes (Signed)
 History of Present Illness  There is no documented history at this time  Assessments & Plan   There are no diagnoses linked to this encounter.    Additional instructions  Subjective:  Patient presents with venous ulcer of the Bilateral lower extremity.    Procedure:  3 layer unna wrap was placed Bilateral lower extremity.   Plan:   Follow up in one week.

## 2023-06-27 ENCOUNTER — Encounter (INDEPENDENT_AMBULATORY_CARE_PROVIDER_SITE_OTHER): Payer: Self-pay | Admitting: Nurse Practitioner

## 2023-07-03 ENCOUNTER — Ambulatory Visit (INDEPENDENT_AMBULATORY_CARE_PROVIDER_SITE_OTHER): Admitting: Nurse Practitioner

## 2023-07-03 ENCOUNTER — Encounter (INDEPENDENT_AMBULATORY_CARE_PROVIDER_SITE_OTHER): Payer: Self-pay

## 2023-07-03 VITALS — BP 176/84 | HR 80 | Resp 16 | Ht 67.0 in | Wt 279.2 lb

## 2023-07-03 DIAGNOSIS — I890 Lymphedema, not elsewhere classified: Secondary | ICD-10-CM

## 2023-07-03 NOTE — Progress Notes (Signed)
 History of Present Illness  There is no documented history at this time  Assessments & Plan   There are no diagnoses linked to this encounter.    Additional instructions  Subjective:  Patient presents with venous ulcer of the Bilateral lower extremity.    Procedure:  3 layer unna wrap was placed Bilateral lower extremity.   Plan:   Follow up in one week.

## 2023-07-10 ENCOUNTER — Ambulatory Visit (INDEPENDENT_AMBULATORY_CARE_PROVIDER_SITE_OTHER): Admitting: Nurse Practitioner

## 2023-07-10 DIAGNOSIS — I890 Lymphedema, not elsewhere classified: Secondary | ICD-10-CM | POA: Diagnosis not present

## 2023-07-10 NOTE — Progress Notes (Signed)
 History of Present Illness  There is no documented history at this time  Assessments & Plan   There are no diagnoses linked to this encounter.    Additional instructions  Subjective:  Patient presents with venous ulcer of the Bilateral lower extremity.    Procedure:  3 layer unna wrap was placed Bilateral lower extremity.   Plan:   Follow up in one week.

## 2023-07-11 ENCOUNTER — Telehealth: Payer: Self-pay

## 2023-07-11 NOTE — Progress Notes (Signed)
   07/11/2023  Patient ID: Bethany [name], female   DOB: 1945-04-09, 78 y.o.   MRN: [id number]  I initially called the patient to follow up regarding the Diabetes [facility] metric, but it appears the patient is no longer with [facility]. She confirmed that her primary care provider is Camelia Novak, NP, at [facility] in [place], [place].  Dorcas Solian, PharmD Clinical Pharmacist Cell: [phone]

## 2023-07-13 ENCOUNTER — Encounter (INDEPENDENT_AMBULATORY_CARE_PROVIDER_SITE_OTHER): Payer: Self-pay | Admitting: Nurse Practitioner

## 2023-07-16 ENCOUNTER — Ambulatory Visit
Admission: RE | Admit: 2023-07-16 | Discharge: 2023-07-16 | Disposition: A | Discharge status: Home / Self Care | Source: Ambulatory Visit | Attending: Nurse Practitioner | Admitting: Nurse Practitioner

## 2023-07-16 DIAGNOSIS — Z1231 Encounter for screening mammogram for malignant neoplasm of breast: Secondary | ICD-10-CM | POA: Insufficient documentation

## 2023-07-21 ENCOUNTER — Ambulatory Visit (INDEPENDENT_AMBULATORY_CARE_PROVIDER_SITE_OTHER): Admitting: Nurse Practitioner

## 2023-07-21 ENCOUNTER — Encounter (INDEPENDENT_AMBULATORY_CARE_PROVIDER_SITE_OTHER): Payer: Self-pay | Admitting: Nurse Practitioner

## 2023-07-21 VITALS — BP 168/80 | HR 70 | Ht 67.0 in | Wt 279.0 lb

## 2023-07-21 DIAGNOSIS — E1122 Type 2 diabetes mellitus with diabetic chronic kidney disease: Secondary | ICD-10-CM

## 2023-07-21 DIAGNOSIS — N183 Chronic kidney disease, stage 3 unspecified: Secondary | ICD-10-CM

## 2023-07-21 DIAGNOSIS — I890 Lymphedema, not elsewhere classified: Secondary | ICD-10-CM

## 2023-07-21 DIAGNOSIS — Z794 Long term (current) use of insulin: Secondary | ICD-10-CM

## 2023-07-21 DIAGNOSIS — I10 Essential (primary) hypertension: Secondary | ICD-10-CM

## 2023-07-21 NOTE — Progress Notes (Signed)
 Subjective:    Patient ID: Bethany [name], female    DOB: [birth year], 78 y.o.   MRN: [id number] Chief Complaint  Patient presents with   unna f/u    The patient returns today for follow-up evaluation after being in Unna boots for several weeks due to an open wound.  Today the wound has resolved.  She notes that the last set of Unna boots were uncomfortable for her and she had remove them little bit early.  She does note that she continues to utilize her lymphedema pump fairly consistently.  She does struggle with use of compression regularly however.    Review of Systems  Cardiovascular:  Positive for leg swelling.  Musculoskeletal:  Positive for arthralgias.  Skin:  Negative for wound.  All other systems reviewed and are negative.      Objective:   Physical Exam Vitals reviewed.  HENT:     Head: Normocephalic.  Cardiovascular:     Rate and Rhythm: Normal rate.  Pulmonary:     Effort: Pulmonary effort is normal.  Musculoskeletal:     Right lower leg: 2+ Edema present.     Left lower leg: 2+ Edema present.  Skin:    General: Skin is warm and dry.  Neurological:     Mental Status: She is alert and oriented to person, place, and time.  Psychiatric:        Mood and Affect: Mood normal.        Behavior: Behavior normal.        Thought Content: Thought content normal.        Judgment: Judgment normal.     BP (!) 168/80   Pulse 70   Ht 5' 7 (1.702 m)   Wt 279 lb (126.6 kg)   BMI 43.70 kg/m   Past Medical History:  Diagnosis Date   Arthritis    Chronic kidney disease    Diabetes mellitus without complication (HCC)    Type II   Fibromyalgia    GERD (gastroesophageal reflux disease)    better after gall bladder surgery-    Hyperlipidemia    Hypertension    Shortness of breath dyspnea    with exertion    Social History   Socioeconomic History   Marital status: Widowed    Spouse name: Not on file   Number of children: Not on file   Years of education:  Not on file   Highest education level: Not on file  Occupational History   Not on file  Tobacco Use   Smoking status: Never   Smokeless tobacco: Never  Vaping Use   Vaping status: Never Used  Substance and Sexual Activity   Alcohol  use: No   Drug use: No   Sexual activity: Not Currently  Other Topics Concern   Not on file  Social History Narrative   Not on file   Social Drivers of Health   Financial Resource Strain: Not on file  Food Insecurity: Not on file  Transportation Needs: Not on file  Physical Activity: Not on file  Stress: Not on file  Social Connections: Not on file  Intimate Partner Violence: Not on file    Past Surgical History:  Procedure Laterality Date   ABDOMINAL HYSTERECTOMY  01/15/1995   CATARACT EXTRACTION Bilateral 01/15/2012   CHOLECYSTECTOMY  01/15/2008   COLONOSCOPY     COLONOSCOPY WITH PROPOFOL  N/A 04/21/2015   Procedure: COLONOSCOPY WITH PROPOFOL ;  Surgeon: Deward CINDERELLA Piedmont, MD;  Location: [facility] ENDOSCOPY;  Service:  Gastroenterology;  Laterality: N/A;   EYE SURGERY Bilateral    Cataract   LUMBAR WOUND DEBRIDEMENT N/A 10/20/2014   Procedure: LUMBAR WOUND DEBRIDEMENT;  Surgeon: Alm GORMAN Molt, MD;  Location: [facility];  Service: Neurosurgery;  Laterality: N/A;   MAXIMUM ACCESS (MAS)POSTERIOR LUMBAR INTERBODY FUSION (PLIF) 1 LEVEL N/A 09/16/2014   Procedure: L/4-5 FOR MAXIMUM ACCESS (MAS) POSTERIOR LUMBAR INTERBODY FUSION (PLIF) 1 LEVEL;  Surgeon: Alm GORMAN Molt, MD;  Location: [facility];  Service: Neurosurgery;  Laterality: N/A;  L/4-5 FOR MAXIMUM ACCESS (MAS) POSTERIOR LUMBAR INTERBODY FUSION (PLIF) 1 LEVEL   REFRACTIVE SURGERY Left     Family History  Problem Relation Age of Onset   Arthritis Mother    Cancer Mother        Ovarian   Diabetes Mother    Heart attack Father    Heart disease Father    Hypertension Father    Cancer Brother    Diabetes Brother     Not on File     Latest Ref Rng & Units 06/12/2021   10:36 AM 01/27/2020     4:18 PM 06/29/2018   12:31 PM  CBC  WBC 3.4 - 10.8 x10E3/uL 8.0  8.6  7.3   Hemoglobin 11.1 - 15.9 g/dL 87.6  87.7  87.7   Hematocrit 34.0 - 46.6 % 39.1  37.7  38.0   Platelets 150 - 450 x10E3/uL 315  270.0  279.0       CMP     Component Value Date/Time   NA 144 06/12/2021 1036   K 4.8 06/12/2021 1036   CL 103 06/12/2021 1036   CO2 23 06/12/2021 1036   GLUCOSE 198 (H) 07/09/2021 1119   BUN 18 06/12/2021 1036   CREATININE 1.01 (H) 06/12/2021 1036   CALCIUM  9.8 06/12/2021 1036   PROT 7.4 06/12/2021 1036   ALBUMIN 4.2 06/12/2021 1036   AST 12 06/12/2021 1036   ALT 12 06/12/2021 1036   ALKPHOS 164 (H) 06/12/2021 1036   BILITOT 0.3 06/12/2021 1036   GFR 43.41 (L) 01/27/2020 1618   EGFR 57 (L) 06/12/2021 1036   GFRNONAA 55 (L) 10/20/2014 1039     No results found.     Assessment & Plan:   1. Lymphedema (Primary) The patient's legs are improved from her previous status.  We will remove her from [employer] today and she is encouraged to engage with conservative therapies including utilizing compression socks on a more consistent basis.  I have given her prescription for this today as well.  She will also continue with use of her lymphedema pump which she is fairly consistent about.  Will have the patient return in 3 months to evaluate progress with edema.  2. Essential hypertension Continue antihypertensive medications as already ordered, these medications have been reviewed and there are no changes at this time.  3. Type 2 diabetes mellitus with stage 3 chronic kidney disease, with long-term current use of insulin , unspecified whether stage 3a or 3b CKD (HCC) Continue hypoglycemic medications as already ordered, these medications have been reviewed and there are no changes at this time.  Hgb A1C to be monitored as already arranged by primary service   Current Outpatient Medications on File Prior to Visit  Medication Sig Dispense Refill   Alcohol  Swabs (DROPSAFE ALCOHOL   PREP) 70 % PADS USE TWICE DAILY 200 each 3   amLODipine  (NORVASC ) 2.5 MG tablet TAKE 1 TABLET EVERY DAY 90 tablet 3   atorvastatin  (LIPITOR) 20 MG  tablet TAKE 1 TABLET EVERY DAY 90 tablet 3   Blood Glucose Monitoring Suppl (TRUE METRIX METER) DEVI by Does not apply route.     Cyanocobalamin  (B-12 COMPLIANCE INJECTION IJ) Inject as directed.     diclofenac Sodium (VOLTAREN) 1 % GEL Voltaren 1 % topical gel  APPLY 2 GRAMS TO THE AFFECTED AREA(S) BY TOPICAL ROUTE 4 TIMES PER DAY     docusate sodium  (COLACE) 100 MG capsule Take 100 mg by mouth daily as needed for mild constipation. Reported on 03/29/2015     DROPLET INSULIN  SYRINGE 30G X 1/2 1 ML MISC USE TO INJECT INSULIN  EVERY DAY WITH BREAKFAST 100 each 2   furosemide  (LASIX ) 20 MG tablet TAKE 1 TABLET EVERY DAY AS NEEDED 90 tablet 0   gabapentin  (NEURONTIN ) 100 MG capsule Take 1 capsule (100 mg total) by mouth 3 (three) times daily. 90 capsule 3   glucose blood (TRUE METRIX BLOOD GLUCOSE TEST) test strip 1 each by Other route 4 (four) times daily. 400 each 1   insulin  NPH-regular Human (NOVOLIN  70/30) (70-30) 100 UNIT/ML injection Take 20units with biggest meal of the day 30 mL 2   losartan  (COZAAR ) 100 MG tablet Take 1 tablet (100 mg total) by mouth daily. Overdue follow up.  PLEASE CALL OFFICE TO SCHEDULE APPOINTMENT PRIOR TO NEXT REFILL 30 tablet 0   metoprolol  succinate (TOPROL -XL) 50 MG 24 hr tablet Take 1 tablet (50 mg total) by mouth daily. Take with or immediately following a meal. 7 tablet 0   metoprolol  succinate (TOPROL -XL) 50 MG 24 hr tablet Take 1 tablet (50 mg total) by mouth daily. Take with or immediately following a meal. 90 tablet 3   omeprazole  (PRILOSEC) 40 MG capsule Take 1 capsule (40 mg total) by mouth daily. 90 capsule 1   tirzepatide  (MOUNJARO ) 2.5 MG/0.5ML Pen Inject 2.5 mg into the skin once a week. 2 mL 12   triamcinolone  cream (KENALOG ) 0.1 % APPLY TOPICALLY 4 (FOUR) TIMES DAILY AS NEEDED FOR ITCHING 80 g 2   TRUEplus  Lancets 33G MISC TEST BLOOD SUGAR TWICE DAILY 200 each 3   No current facility-administered medications on file prior to visit.    There are no Patient Instructions on file for this visit. No follow-ups on file.   Traven Davids E [name], NP

## 2023-08-04 ENCOUNTER — Telehealth: Payer: Self-pay

## 2023-08-04 NOTE — Progress Notes (Signed)
   08/04/2023  Patient ID: [name], female   DOB: 1945/08/15, 78 y.o.   MRN: [id number]  I initially called the patient to follow up regarding the Diabetes True [facility] metric, but it appears the patient is no longer with [facility]. She previously confirmed that her primary care provider is Camelia Novak, NP, at [facility] in [place], [place]. Called patient today to update her insurance with PCP information for re-attribution.   Dorcas Solian, PharmD Clinical Pharmacist Cell: [phone]

## 2023-08-14 ENCOUNTER — Ambulatory Visit (INDEPENDENT_AMBULATORY_CARE_PROVIDER_SITE_OTHER): Payer: Medicare HMO | Admitting: Nurse Practitioner

## 2023-08-14 ENCOUNTER — Encounter (INDEPENDENT_AMBULATORY_CARE_PROVIDER_SITE_OTHER): Payer: Self-pay

## 2023-08-22 NOTE — Progress Notes (Signed)
 Sleep Medicine   Office Visit  Patient Name: Bethany [name] DOB: 80/27/1947 MRN [id number]    Chief Complaint: sleep apnea  Brief History:  Bethany [name] presents for an initial consult to discuss the results of her sleep study and to establish care. Patient has a 9 month history of sleep apnea. Sleep quality is poor. This is noted every night. The patient's bed partner reports living alone and has no on to tell her of witnessed symptoms. The patient relates the following symptoms: excessive daytime sleepiness, initiating and staying asleep due to some pain are also present. The patient goes to sleep between 1000 pm and midnight and wakes up between 0500 and 0600 am and wakes up in between with trouble returning to sleep.  Sleep quality is worse when outside home environment.  Patient has noted no significant movement of her legs at night that would disrupt her sleep.  The patient  relates no unusual behavior during the night.  The patient relates  no history of psychiatric problems. The Epworth Sleepiness Score is 8 out of 24 .  The patient relates  Cardiovascular risk factors include: HTN.    ROS  General: (-) fever, (-) chills, (-) night sweat Nose and Sinuses: (-) nasal stuffiness or itchiness, (-) postnasal drip, (-) nosebleeds, (-) sinus trouble. Mouth and Throat: (-) sore throat, (-) hoarseness. Neck: (-) swollen glands, (-) enlarged thyroid , (-) neck pain. Respiratory: - cough, - shortness of breath, - wheezing. Neurologic: + numbness, + tingling. Psychiatric: - anxiety, - depression Sleep behavior: -sleep paralysis -hypnogogic hallucinations -dream enactment      -vivid dreams -cataplexy -night terrors -sleep walking   Current Medication: Outpatient Encounter Medications as of 08/25/2023  Medication Sig Note   losartan  (COZAAR ) 50 MG tablet Take 50 mg by mouth daily.    NOVOLIN  70/30 KWIKPEN (70-30) 100 UNIT/ML KwikPen     Alcohol  Swabs (DROPSAFE ALCOHOL  PREP) 70 % PADS USE TWICE  DAILY    amLODipine  (NORVASC ) 2.5 MG tablet TAKE 1 TABLET EVERY DAY    atorvastatin  (LIPITOR) 20 MG tablet TAKE 1 TABLET EVERY DAY    Blood Glucose Monitoring Suppl (TRUE METRIX METER) DEVI by Does not apply route.    Cyanocobalamin  (B-12 COMPLIANCE INJECTION IJ) Inject as directed.    diclofenac Sodium (VOLTAREN) 1 % GEL Voltaren 1 % topical gel  APPLY 2 GRAMS TO THE AFFECTED AREA(S) BY TOPICAL ROUTE 4 TIMES PER DAY    docusate sodium  (COLACE) 100 MG capsule Take 100 mg by mouth daily as needed for mild constipation. Reported on 03/29/2015    DROPLET INSULIN  SYRINGE 30G X 1/2 1 ML MISC USE TO INJECT INSULIN  EVERY DAY WITH BREAKFAST    furosemide  (LASIX ) 20 MG tablet TAKE 1 TABLET EVERY DAY AS NEEDED    gabapentin  (NEURONTIN ) 100 MG capsule Take 1 capsule (100 mg total) by mouth 3 (three) times daily.    glucose blood (TRUE METRIX BLOOD GLUCOSE TEST) test strip 1 each by Other route 4 (four) times daily.    metoprolol  succinate (TOPROL -XL) 50 MG 24 hr tablet Take 1 tablet (50 mg total) by mouth daily. Take with or immediately following a meal. 02/14/2023: duplicate   metoprolol  succinate (TOPROL -XL) 50 MG 24 hr tablet Take 1 tablet (50 mg total) by mouth daily. Take with or immediately following a meal.    omeprazole  (PRILOSEC) 40 MG capsule Take 1 capsule (40 mg total) by mouth daily.    tirzepatide  (MOUNJARO ) 2.5 MG/0.5ML Pen Inject 2.5 mg into the  skin once a week.    triamcinolone  cream (KENALOG ) 0.1 % APPLY TOPICALLY 4 (FOUR) TIMES DAILY AS NEEDED FOR ITCHING    TRUEplus Lancets 33G MISC TEST BLOOD SUGAR TWICE DAILY    [DISCONTINUED] insulin  NPH-regular Human (NOVOLIN  70/30) (70-30) 100 UNIT/ML injection Take 20units with biggest meal of the day    [DISCONTINUED] losartan  (COZAAR ) 100 MG tablet Take 1 tablet (100 mg total) by mouth daily. Overdue follow up.  PLEASE CALL OFFICE TO SCHEDULE APPOINTMENT PRIOR TO NEXT REFILL 02/14/2023: Disregard, patient states she pores tablets in old bottles;  she is actually taking 100 mg of losartan    No facility-administered encounter medications on file as of 08/25/2023.    Surgical History: Past Surgical History:  Procedure Laterality Date   ABDOMINAL HYSTERECTOMY  01/15/1995   CATARACT EXTRACTION Bilateral 01/15/2012   CHOLECYSTECTOMY  01/15/2008   COLONOSCOPY     COLONOSCOPY WITH PROPOFOL  N/A 04/21/2015   Procedure: COLONOSCOPY WITH PROPOFOL ;  Surgeon: Deward CINDERELLA Piedmont, MD;  Location: [facility] END[facility];  Service: Gastroenterology;  Laterality: N/A;   EYE SURGERY Bilateral    Cataract   LUMBAR WOUND DEBRIDEMENT N/A 10/20/2014   Procedure: LUMBAR WOUND DEBRIDEMENT;  Surgeon: Alm GORMAN Molt, MD;  Location: [facility];  Service: Neurosurgery;  Laterality: N/A;   MAXIMUM ACCESS (MAS)POSTERIOR LUMBAR INTERBODY FUSION (PLIF) 1 LEVEL N/A 09/16/2014   Procedure: L/4-5 FOR MAXIMUM ACCESS (MAS) POSTERIOR LUMBAR INTERBODY FUSION (PLIF) 1 LEVEL;  Surgeon: Alm GORMAN Molt, MD;  Location: [facility];  Service: Neurosurgery;  Laterality: N/A;  L/4-5 FOR MAXIMUM ACCESS (MAS) POSTERIOR LUMBAR INTERBODY FUSION (PLIF) 1 LEVEL   REFRACTIVE SURGERY Left     Medical History: Past Medical History:  Diagnosis Date   Arthritis    Chronic kidney disease    Diabetes mellitus without complication (HCC)    Type II   Fibromyalgia    GERD (gastroesophageal reflux disease)    better after gall bladder surgery-    Hyperlipidemia    Hypertension    Shortness of breath dyspnea    with exertion    Family History: Non contributory to the present illness  Social History: Social History   Socioeconomic History   Marital status: Widowed    Spouse name: Not on file   Number of children: Not on file   Years of education: Not on file   Highest education level: Not on file  Occupational History   Not on file  Tobacco Use   Smoking status: Never   Smokeless tobacco: Never  Vaping Use   Vaping status: Never Used  Substance and Sexual Activity   Alcohol  use: No    Drug use: No   Sexual activity: Not Currently  Other Topics Concern   Not on file  Social History Narrative   Not on file   Social Drivers of Health   Financial Resource Strain: Not on file  Food Insecurity: Not on file  Transportation Needs: Not on file  Physical Activity: Not on file  Stress: Not on file  Social Connections: Not on file  Intimate Partner Violence: Not on file    Vital Signs: Blood pressure (!) 185/83, pulse 75. There is no height or weight on file to calculate BMI.   Examination: General Appearance: The patient is well-developed, well-nourished, and in no distress. Neck Circumference: 38 cm Skin: Gross inspection of skin unremarkable. Head: normocephalic, no gross deformities. Eyes: no gross deformities noted. ENT: ears appear grossly normal Neurologic: Alert and oriented. No involuntary movements.  STOP BANG RISK ASSESSMENT S (snore) Have you been told that you snore?     NO   T (tired) Are you often tired, fatigued, or sleepy during the day?   YES  O (obstruction) Do you stop breathing, choke, or gasp during sleep? NO   P (pressure) Do you have or are you being treated for high blood pressure? YES   B (BMI) Is your body index greater than 35 kg/m? YES   A (age) Are you 39 years old or older? YES   N (neck) Do you have a neck circumference greater than 16 inches?   NO   G (gender) Are you a female? YES   TOTAL STOP/BANG "YES" ANSWERS 5                                                               A STOP-Bang score of 2 or less is considered low risk, and a score of 5 or more is high risk for having either moderate or severe OSA. For people who score 3 or 4, doctors may need to perform further assessment to determine how likely they are to have OSA.         EPWORTH SLEEPINESS SCALE:  Scale:  (0)= no chance of dozing; (1)= slight chance of dozing; (2)= moderate chance of dozing; (3)= high chance of dozing  Chance  Situtation     Sitting and reading: 3    Watching TV: 3    Sitting Inactive in public: 0    As a passenger in car: 0      Lying down to rest: 0    Sitting and talking: 0    Sitting quielty after lunch: 2    In a car, stopped in traffic: 0   TOTAL SCORE:   8 out of 24    SLEEP STUDIES:  PSG (06/2020) AHI 6.7/hr,supine AHI 7.7/hr, REM AHI 20.4/hr, min SpO2 20.4 Titration (07/2020) CPAP@ 9 cmH2O PSG (11/2022) AHI 52/hr, min SpO2 50%  LABS: No results found for this or any previous visit (from the past 2160 hours).  Radiology: MM 3D SCREENING MAMMOGRAM BILATERAL BREAST Result Date: 07/21/2023 CLINICAL DATA:  Screening. EXAM: DIGITAL SCREENING BILATERAL MAMMOGRAM WITH TOMOSYNTHESIS AND CAD TECHNIQUE: Bilateral screening digital craniocaudal and mediolateral oblique mammograms were obtained. Bilateral screening digital breast tomosynthesis was performed. The images were evaluated with computer-aided detection. COMPARISON:  Previous exam(s). ACR Breast Density Category b: There are scattered areas of fibroglandular density. FINDINGS: There are no findings suspicious for malignancy. IMPRESSION: No mammographic evidence of malignancy. A result letter of this screening mammogram will be mailed directly to the patient. RECOMMENDATION: Screening mammogram in one year. (Code:SM-B-01Y) BI-RADS CATEGORY  1: Negative. Electronically Signed   By: Norleen Croak M.D.   On: 07/21/2023 12:27    No results found.  No results found.    Assessment and Plan: Patient Active Problem List   Diagnosis Date Noted   Benign hypertension 08/25/2023   Type 2 diabetes mellitus with diabetic polyneuropathy (HCC) 06/19/2023   Lymphedema 12/28/2020   Chronic venous insufficiency 12/28/2020   Diabetes (HCC) 10/28/2020   Chronic kidney disease, stage 3a (HCC) 10/04/2020   Heart failure, unspecified (HCC) 10/04/2020   OSA (obstructive sleep apnea) 06/27/2020   Trigger finger of left  hand 03/05/2018   CHF (congestive  heart failure), NYHA class I, chronic, diastolic (HCC) 11/11/2017   GERD (gastroesophageal reflux disease) 03/21/2017   Arthritis 03/21/2017   Fibromyalgia 03/21/2017   Arthrodesis status 09/16/2014   Edema 05/30/2014   HLD (hyperlipidemia) 08/02/2013   Essential hypertension 08/02/2013   Morbid (severe) obesity due to excess calories (HCC) 08/02/2013   1. OSA (obstructive sleep apnea) (Primary) PLAN OSA:   Patient evaluation suggests high risk of sleep disordered breathing due to severe OSA on recent PSG with AHI of 51.6, with nadir oxygen reading of 50%.  Patient has comorbid cardiovascular risk factors including: congestive heart failiure and hypertension which could be exacerbated by pathologic sleep-disordered breathing.  Suggest: CPAP titration to assess/treat the patient's sleep disordered breathing. The patient was also counselled on weight loss to optimize sleep health.  2. Benign hypertension Controlled with amlodipine , losartan , furosemide , metoprolol . Continue.   3. Morbid obesity with BMI of 40.0-44.9, adult (HCC) Obesity Counseling: Had a lengthy discussion regarding patients BMI and weight issues. Patient was instructed on portion control as well as increased activity. Also discussed caloric restrictions with trying to maintain intake less than 2000 Kcal. Discussions were made in accordance with the 5As of weight management. Simple actions such as not eating late and if able to, taking a walk is suggested.     General Counseling: I have discussed the findings of the evaluation and examination with [facility].  I have also discussed any further diagnostic evaluation thatmay be needed or ordered today. Sheli verbalizes understanding of the findings of todays visit. We also reviewed her medications today and discussed drug interactions and side effects including but not limited excessive drowsiness and altered mental states. We also discussed that there is always a risk not just to her  but also people around her. she has been encouraged to call the office with any questions or concerns that should arise related to todays visit.  No orders of the defined types were placed in this encounter.       I have personally obtained a history, evaluated the patient, evaluated pertinent data, formulated the assessment and plan and placed orders.   This patient was seen today by Lauraine Lay, PA-C in collaboration with Dr. Elfreda Bathe.   Elfreda DELENA Bathe, MD [facility] Diplomate ABMS Pulmonary and Critical Care Medicine Sleep medicine

## 2023-08-25 ENCOUNTER — Ambulatory Visit (INDEPENDENT_AMBULATORY_CARE_PROVIDER_SITE_OTHER): Admitting: Internal Medicine

## 2023-08-25 VITALS — BP 185/83 | HR 75

## 2023-08-25 DIAGNOSIS — G4733 Obstructive sleep apnea (adult) (pediatric): Secondary | ICD-10-CM

## 2023-08-25 DIAGNOSIS — I10 Essential (primary) hypertension: Secondary | ICD-10-CM | POA: Diagnosis not present

## 2023-08-25 DIAGNOSIS — Z6841 Body Mass Index (BMI) 40.0 and over, adult: Secondary | ICD-10-CM

## 2023-08-25 NOTE — Patient Instructions (Signed)
 Living With Sleep Apnea Sleep apnea is a condition that affects your breathing while you're sleeping. Your tongue or the tissue in your throat may block the flow of air while you sleep. You may have shallow breathing or stop breathing for short periods of time. The breaks in breathing interrupt the deep sleep that you need to feel rested. Even if you don't wake up from the gaps in breathing, you may feel tired during the day. People with sleep apnea may snore loudly. You may have a headache in the morning and feel anxious or depressed. How can sleep apnea affect me? Sleep apnea increases your chances of being very tired during the day. This is called daytime fatigue. Sleep apnea can also increase your risk of: Heart attack. Stroke. Obesity. Type 2 diabetes. Heart failure. Irregular heartbeat. High blood pressure. If you are very tired during the day, you may be more likely to: Not do well in school or at work. Fall asleep while driving. Have trouble paying attention. Develop depression or anxiety. Have problems having sex. This is called sexual dysfunction. What actions can I take to manage sleep apnea? Sleep apnea treatment  If you were given a device to open your airway while you sleep, use it only as told by your health care provider. You may be given: An oral appliance. This is a mouthpiece that shifts your lower jaw forward. A continuous positive airway pressure (CPAP) device. This blows air through a mask. A nasal expiratory positive airway pressure (EPAP) device. This has valves that you put into each nostril. A bi-level positive airway pressure (BIPAP) device. This blows air through a mask when you breathe in and breathe out. You may need surgery if other treatments don't work for you. Sleep habits Go to sleep and wake up at the same time every day. This helps set your internal clock for sleeping. If you stay up later than usual on weekends, try to get up in the morning within 2  hours of the time you usually wake up. Try to get at least 7-9 hours of sleep each night. Stop using a computer, tablet, and mobile phone a few hours before bedtime. Do not take long naps during the day. If you nap, limit it to 30 minutes. Have a relaxing bedtime routine. Reading or listening to music may relax you and help you sleep. Use your bedroom only for sleep. Keep your television and computer out of your bedroom. Keep your bedroom cool, dark, and quiet. Use a supportive mattress and pillows. Follow your provider's instructions for other changes to sleep habits. Nutrition Do not eat big meals in the evening. Do not have caffeine in the later part of the day. The effects of caffeine can last for more than 5 hours. Follow your provider's instructions for any changes to what you eat and drink. Lifestyle Do not drink alcohol before bedtime. Alcohol can cause you to fall asleep at first, but then it can cause you to wake up in the middle of the night and have trouble getting back to sleep. Do not smoke, vape, or use nicotine or tobacco. Medicines Take over-the-counter and prescription medicines only as told by your provider. Do not use over-the-counter sleep medicine. You may become dependent on this medicine, and it can make sleep apnea worse. Do not take medicines, such as sedatives and narcotics, unless told to by your provider. Activity Exercise on most days, but avoid exercising in the evening. Exercising near bedtime can interfere with sleeping.  If possible, spend time outside every day. Natural light helps with your internal clock. General information Lose weight if you need to. Stay at a healthy weight. If you are having surgery, make sure to tell your provider that you have sleep apnea. You may need to bring your device with you. Keep all follow-up visits. Your provider will want to check on your condition. Where to find more information National [employer], Lung, and [employer]  Institute: [employer] This information is not intended to replace advice given to you by your health care provider. Make sure you discuss any questions you have with your health care provider. Document Revised: 04/24/2022 Document Reviewed: 04/24/2022 Elsevier Patient Education  2024 [employer].

## 2023-09-17 ENCOUNTER — Encounter (INDEPENDENT_AMBULATORY_CARE_PROVIDER_SITE_OTHER): Payer: Self-pay | Admitting: Internal Medicine

## 2023-09-17 DIAGNOSIS — G4733 Obstructive sleep apnea (adult) (pediatric): Secondary | ICD-10-CM

## 2023-09-18 ENCOUNTER — Ambulatory Visit: Payer: Medicare HMO | Admitting: Physician Assistant

## 2023-10-08 NOTE — Procedures (Signed)
 [facility]  Polysomnogram Report Part I  Phone: [phone] Fax: [phone]  Patient Name: Bethany, [name]. Acquisition Number: [id number]  Date of Birth: 08-21-1945 Acquisition Date: 09/17/2023  Referring Physician: Tinnie Pro PA-C     History: The patient is a 78 year old  . Medical History: Arthritis, chronic kidney disease, fibromyalgia, GERD, hyperlipidemia, hypertension, shortness of breath dyspnea.  Medications: Iosartan, Novolin , alcohol  swabs, amlodipine , atorvastatin , blood glucose monitoring, cyanocobalamin , diclofenac sodium, docusate sodium , droplet insulin  syringe, furosemide , gabapentin , metoprolol  succinate, metoprolol  succinate, omeprazole , tirzepatide , triamcinolone  cream, Trueplus Lancets.  Procedure: This routine overnight polysomnogram was performed on the [facility] 5 using the standard CPAP protocol. This included 6 channels of EEG, 2 channels of EOG, chin EMG, bilateral anterior tibialis EMG, nasal/oral thermistor, PTAF (nasal pressure transducer), chest and abdominal wall movements, EKG, and pulse oximetry.  Description: The total recording time was 361.5 minutes. The total sleep time was 225.0 minutes. There were a total of 113.5 minutes of wakefulness after sleep onset for a reducedsleep efficiency of 62.2%. The latency to sleep onset was within normal limitsat 23.0 minutes. The R sleep onset latency was prolonged at 149.5 minutes. Sleep parameters, as a percentage of the total sleep time, demonstrated 37.8% of sleep was in N1 sleep, 38.0% N2, 8.7% N3 and 15.6% R sleep. There were a total of 140 arousals for an arousal index of 37.3 arousals per hour of sleep that was elevated.  Only 2 respiratory events were observed during the pressure titration.  was initiated at 4.0 cm of H2O at lights out, 10:24 p.m. I.t was titrated in 1-2 cm increments to the final pressure of 7 cm H2O, largely for flow limitation. Supine, REM sleep was observed at the final  pressure  Additionally, the baseline oxygen saturation during wakefulness was 100%, during NREM sleep averaged 100%, and during REM sleep averaged 99%. The total duration of oxygen < 90% was 0.2 minutes.  Cardiac monitoring-  significant cardiac rhythm irregularities.   Periodic limb movement monitoring- demonstrated that there were 69 periodic limb movements for a periodic limb movement index of 18.4 periodic limb movements per hour of sleep.     Impression: This patient's obstructive sleep apnea demonstrated significant improvement with the utilization of nasal  at 7 cm H2O.   There was a significantly elevated periodic limb movement index of 18.4 periodic limb movements per hour of sleep. These limb movements were also observed during the prior PSG. Treatment may be indicated if sleep disruption or sleepiness persist once the patient is fully compliant with CPAP.  Recommendations: Would recommend utilization of nasal  at 7 cm H2O.      An AirFit F40 mask, size Medium, was used. Chin strap used during study- No. Humidifier used during study- Yes.     Bethany DELENA Bathe, MD [facility] Diplomate ABMS Pulmonary Critical Care and Sleep Medicine Electronically reviewed and digitally signed   [facility] CPAP/BIPAP Polysomnogram Report Part II Phone: [phone] Fax: [phone]  Patient last name [name] Neck Size    in. Acquisition [phone]  Patient first name Bethany [name]. Weight 300.0 lbs. Started 09/17/2023 at 10:11:05 PM  Birth date 04-26-1945 Height 67.0 in. Stopped 09/18/2023 at 4:28:23 AM  Age 78      Type Adult BMI 47.0 lb./in2 Duration 361.5  Raford Sprang. RPSGT // Daril Burr   Reviewed by: Marval MATSU. Colvin, PhD, ABSM, FAASM Sleep Data: Lights Out: 10:24:35 PM Sleep Onset: 10:47:35 PM  Lights On: 4:26:05 AM Sleep  Efficiency: 62.2 %  Total Recording Time: 361.5 min Sleep Latency (from Lights Off) 23.0 min  Total Sleep Time (TST): 225.0 min R Latency (from Sleep Onset): 149.5  min  Sleep Period Time: 309.0 min Total number of awakenings: 33  Wake during sleep: 84.0 min Wake After Sleep Onset (WASO): 113.5 min   Sleep Data:         Arousal Summary: Stage  Latency from lights out (min) Latency from sleep onset (min) Duration (min) % Total Sleep Time  Normal values  N 1 23.0 0.0 85.0 37.8 (5%)  N 2 43.0 20.0 85.5 38.0 (50%)  N 3 210.5 187.5 19.5 8.7 (20%)  R 172.5 149.5 35.0 15.6 (25%)   Number Index  Spontaneous 108 28.8  Apneas & Hypopneas 0 0.0  RERAs 0 0.0       (Apneas & Hypopneas & RERAs)  (0) (0.0)  Limb Movement 33 8.8  Snore 0 0.0  TOTAL 141 37.6     Respiratory Data:  CA OA MA Apnea Hypopnea* A+ H RERA Total  Number 0 0 0 0 2 2 0 2  Mean Dur (sec) 0.0 0.0 0.0 0.0 17.5 17.5 0.0 17.5  Max Dur (sec) 0.0 0.0 0.0 0.0 17.5 17.5 0.0 17.5  Total Dur (min) 0.0 0.0 0.0 0.0 0.6 0.6 0.0 0.6  % of TST 0.0 0.0 0.0 0.0 0.3 0.3 0.0 0.3  Index (#/h TST) 0.0 0.0 0.0 0.0 0.5 0.5 0.0 0.5  *Hypopneas scored based on 4% or greater desaturation.  Sleep Stage:         REM NREM TST  AHI 3.4 0.0 0.5  RDI 3.4 0.0 0.5   Sleep (min) TST (%) REM (min) NREM (min) CA (#) OA (#) MA (#) HYP (#) AHI (#/h) RERA (#) RDI (#/h) Desat (#)  Supine 166.5 74.00 35.0 131.5 0 0 0 2 0.7 0 0.7 15  Non-Supine 58.50 26.00 0.00 58.50 0.00 0.00 0.00 0.00 0.00 0 0.00 0.00  Right: 58.5 26.00 0.0 58.5 0 0 0 0 0.0 0 0.00 0     Snoring: Total number of snoring episodes  0  Total time with snoring    min (   % of sleep)   Oximetry Distribution:             WK REM NREM TOTAL  Average (%)   100 99 100 100  < 90% 0.2 0.0 0.0 0.2  < 80% 0.0 0.0 0.0 0.0  < 70% 0.0 0.0 0.0 0.0  # of Desaturations* 3 11 1 15   Desat Index (#/hour) 1.3 18.9 0.3 4.0  Desat Max (%) 14 10 5 14   Desat Max Dur (sec) 25.0 56.0 5.5 56.0  Approx Min O2 during sleep 87  Approx min O2 during a respiratory event 87  Was Oxygen added (Y/N) and final rate :    LPM  *Desaturations based on 4% or greater  drop from baseline.   Cheyne Stokes Breathing: None Present   Heart Rate Summary:  Average Heart Rate During Sleep 62.2 bpm      Highest Heart Rate During Sleep (95th %) 73.0 bpm      Highest Heart Rate During Sleep 130 bpm      Highest Heart Rate During Recording (TIB) 196 bpm (artifact)   Heart Rate Observations: Event Type # Events   Bradycardia 0 Lowest HR Scored: N/A  Sinus Tachycardia During Sleep 0 Highest HR Scored: N/A  Narrow Complex Tachycardia 0 Highest HR Scored: N/A  Wide Complex Tachycardia 0 Highest HR Scored: N/A  Asystole 0 Longest Pause: N/A  Atrial Fibrillation 0 Duration Longest Event: N/A  Other Arrythmias   Type:   Periodic Limb Movement Data: (Primary legs unless otherwise noted) Total # Limb Movement 83 Limb Movement Index 22.1  Total # PLMS 71 PLMS Index 18.9  Total # PLMS Arousals 24 PLMS Arousal Index 6.4  Percentage Sleep Time with PLMS 44.53min (19.8 % sleep)  Mean Duration limb movements (secs) 334.3    IPAP Level (cmH2O) EPAP Level (cmH2O) Total Duration (min) Sleep Duration (min) Sleep (%) REM (%) CA  #) OA # MA # HYP #) AHI (#/hr) RERAs # RERAs (#/hr) RDI (#/hr)  4 4 13.5 5.5 40.7 0.0 0 0 0 0 0.0 0 0.0 0.0  5 5 125.6 71.4 56.8 0.0 0 0 0 0 0.0 0 0.0 0.0  6 6 31.9 25.3 79.3 59.6 0 0 0 1 2.4 0 0.0 2.4  7 7  137.0 122.5 89.4 11.7 0 0 0 1 0.5 0 0.0 0.5

## 2023-10-21 ENCOUNTER — Ambulatory Visit (INDEPENDENT_AMBULATORY_CARE_PROVIDER_SITE_OTHER): Admitting: Nurse Practitioner
